# Patient Record
Sex: Male | Born: 1940 | Race: White | Hispanic: No | Smoking: Never smoker
Health system: Southern US, Community
[De-identification: ages and names within clinical notes are randomized; demographics above are authoritative.]

## PROBLEM LIST (undated history)

## (undated) DIAGNOSIS — C801 Malignant (primary) neoplasm, unspecified: Secondary | ICD-10-CM

## (undated) DIAGNOSIS — Z9289 Personal history of other medical treatment: Secondary | ICD-10-CM

## (undated) DIAGNOSIS — I2510 Atherosclerotic heart disease of native coronary artery without angina pectoris: Secondary | ICD-10-CM

## (undated) DIAGNOSIS — D649 Anemia, unspecified: Secondary | ICD-10-CM

## (undated) DIAGNOSIS — I10 Essential (primary) hypertension: Secondary | ICD-10-CM

## (undated) DIAGNOSIS — K429 Umbilical hernia without obstruction or gangrene: Secondary | ICD-10-CM

## (undated) DIAGNOSIS — I350 Nonrheumatic aortic (valve) stenosis: Secondary | ICD-10-CM

## (undated) DIAGNOSIS — E119 Type 2 diabetes mellitus without complications: Secondary | ICD-10-CM

## (undated) DIAGNOSIS — E785 Hyperlipidemia, unspecified: Secondary | ICD-10-CM

## (undated) DIAGNOSIS — E559 Vitamin D deficiency, unspecified: Secondary | ICD-10-CM

## (undated) DIAGNOSIS — E538 Deficiency of other specified B group vitamins: Secondary | ICD-10-CM

## (undated) DIAGNOSIS — K219 Gastro-esophageal reflux disease without esophagitis: Secondary | ICD-10-CM

## (undated) HISTORY — DX: Anemia, unspecified: D64.9

## (undated) HISTORY — DX: Nonrheumatic aortic (valve) stenosis: I35.0

## (undated) HISTORY — DX: Malignant (primary) neoplasm, unspecified: C80.1

## (undated) HISTORY — DX: Gastro-esophageal reflux disease without esophagitis: K21.9

## (undated) HISTORY — DX: Type 2 diabetes mellitus without complications: E11.9

## (undated) HISTORY — DX: Morbid (severe) obesity due to excess calories: E66.01

## (undated) HISTORY — DX: Essential (primary) hypertension: I10

## (undated) HISTORY — DX: Hyperlipidemia, unspecified: E78.5

## (undated) HISTORY — DX: Deficiency of other specified B group vitamins: E53.8

## (undated) HISTORY — DX: Atherosclerotic heart disease of native coronary artery without angina pectoris: I25.10

## (undated) HISTORY — DX: Vitamin D deficiency, unspecified: E55.9

## (undated) HISTORY — DX: Umbilical hernia without obstruction or gangrene: K42.9

## (undated) HISTORY — DX: Personal history of other medical treatment: Z92.89

---

## 1998-04-03 ENCOUNTER — Ambulatory Visit (HOSPITAL_COMMUNITY): Admission: RE | Admit: 1998-04-03 | Discharge: 1998-04-03 | Payer: Self-pay | Admitting: *Deleted

## 1999-05-01 ENCOUNTER — Ambulatory Visit (HOSPITAL_COMMUNITY): Admission: RE | Admit: 1999-05-01 | Discharge: 1999-05-01 | Payer: Self-pay | Admitting: *Deleted

## 2000-05-15 ENCOUNTER — Ambulatory Visit (HOSPITAL_COMMUNITY): Admission: RE | Admit: 2000-05-15 | Discharge: 2000-05-15 | Payer: Self-pay | Admitting: *Deleted

## 2001-05-17 ENCOUNTER — Encounter: Admission: RE | Admit: 2001-05-17 | Discharge: 2001-06-23 | Payer: Self-pay | Admitting: Internal Medicine

## 2001-06-30 ENCOUNTER — Encounter (INDEPENDENT_AMBULATORY_CARE_PROVIDER_SITE_OTHER): Payer: Self-pay | Admitting: Specialist

## 2001-06-30 ENCOUNTER — Ambulatory Visit (HOSPITAL_COMMUNITY): Admission: RE | Admit: 2001-06-30 | Discharge: 2001-06-30 | Payer: Self-pay | Admitting: Gastroenterology

## 2003-06-13 ENCOUNTER — Encounter: Admission: RE | Admit: 2003-06-13 | Discharge: 2003-09-11 | Payer: Self-pay | Admitting: Internal Medicine

## 2003-06-15 ENCOUNTER — Encounter (INDEPENDENT_AMBULATORY_CARE_PROVIDER_SITE_OTHER): Payer: Self-pay | Admitting: Cardiology

## 2003-06-15 ENCOUNTER — Ambulatory Visit (HOSPITAL_COMMUNITY): Admission: RE | Admit: 2003-06-15 | Discharge: 2003-06-15 | Payer: Self-pay | Admitting: Cardiology

## 2003-07-07 ENCOUNTER — Encounter: Payer: Self-pay | Admitting: Cardiology

## 2003-07-07 ENCOUNTER — Encounter: Admission: RE | Admit: 2003-07-07 | Discharge: 2003-07-07 | Payer: Self-pay | Admitting: Cardiology

## 2003-07-13 ENCOUNTER — Ambulatory Visit (HOSPITAL_COMMUNITY): Admission: RE | Admit: 2003-07-13 | Discharge: 2003-07-13 | Payer: Self-pay | Admitting: Cardiology

## 2003-07-13 HISTORY — PX: CARDIAC CATHETERIZATION: SHX172

## 2003-07-18 ENCOUNTER — Encounter: Payer: Self-pay | Admitting: Cardiothoracic Surgery

## 2003-07-18 ENCOUNTER — Encounter (INDEPENDENT_AMBULATORY_CARE_PROVIDER_SITE_OTHER): Payer: Self-pay | Admitting: *Deleted

## 2003-07-18 ENCOUNTER — Inpatient Hospital Stay (HOSPITAL_COMMUNITY): Admission: RE | Admit: 2003-07-18 | Discharge: 2003-07-25 | Payer: Self-pay | Admitting: Cardiothoracic Surgery

## 2003-07-18 HISTORY — PX: TEE WITHOUT CARDIOVERSION: SHX5443

## 2003-07-18 HISTORY — PX: AORTIC VALVE REPLACEMENT: SHX41

## 2003-07-18 HISTORY — PX: AORTIC VALVE REPLACEMENT (AVR)/CORONARY ARTERY BYPASS GRAFTING (CABG): SHX5725

## 2003-07-19 ENCOUNTER — Encounter: Payer: Self-pay | Admitting: Cardiothoracic Surgery

## 2003-07-20 ENCOUNTER — Encounter: Payer: Self-pay | Admitting: Cardiothoracic Surgery

## 2003-08-07 ENCOUNTER — Encounter (HOSPITAL_COMMUNITY): Admission: RE | Admit: 2003-08-07 | Discharge: 2003-11-05 | Payer: Self-pay | Admitting: Cardiology

## 2003-08-18 ENCOUNTER — Encounter: Payer: Self-pay | Admitting: Cardiothoracic Surgery

## 2003-08-18 ENCOUNTER — Encounter: Admission: RE | Admit: 2003-08-18 | Discharge: 2003-08-18 | Payer: Self-pay | Admitting: Cardiothoracic Surgery

## 2005-11-04 ENCOUNTER — Ambulatory Visit: Payer: Self-pay | Admitting: Dentistry

## 2005-11-04 ENCOUNTER — Encounter: Admission: AD | Admit: 2005-11-04 | Discharge: 2005-11-04 | Payer: Self-pay | Admitting: Dentistry

## 2010-02-12 DIAGNOSIS — Z9289 Personal history of other medical treatment: Secondary | ICD-10-CM

## 2010-02-12 HISTORY — PX: NM MYOCAR PERF WALL MOTION: HXRAD629

## 2010-02-12 HISTORY — DX: Personal history of other medical treatment: Z92.89

## 2010-10-14 ENCOUNTER — Encounter: Admission: RE | Admit: 2010-10-14 | Discharge: 2010-10-14 | Payer: Self-pay | Admitting: Gastroenterology

## 2011-04-11 NOTE — Discharge Summary (Signed)
NAME:  Billy Reilly, Billy Reilly NO.:  [id number]   MEDICAL RECORD NO.:  [id number]                   PATIENT TYPE:  INP   LOCATION:  2012                                 FACILITY:  [facility]   PHYSICIAN:  Ivin Poot, M.D.               DATE OF BIRTH:  August 07, 1941   DATE OF ADMISSION:  07/18/2003  DATE OF DISCHARGE:  07/24/2003                                 DISCHARGE SUMMARY   ADMISSION DIAGNOSES:  1. Aortic stenosis with progressive dyspnea and EKG changes.  2. Hypertension.  3. Obesity with body mass index of 38.8.  4. Adult-onset diabetes mellitus type 2, noninsulin dependent.  5. Hypercholesterolemia.  6. Carcinoma of the prostate.  7. History of diverticulosis.   DISCHARGE DIAGNOSES:  1. 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  2. Hypertension.  3. Obesity with body mass index of 38.8.  4. Adult-onset diabetes mellitus type 2, noninsulin dependent.  5. Hypercholesterolemia.  6. Carcinoma of the prostate.  7. History of diverticulosis.   PROCEDURE:  1. Cardiac catheterization July 13, 2003, Dr. Aldona Bar.  2. Aortic valve replacement with #22 mm ATS-AP valve and saphenous vein     graft to the first diagonal and saphenous vein graft to the posterior     descending July 18, 2003, Dr. Prescott Gum.   BRIEF HISTORY:  The patient is a 70 year old obese type 2 diabetic who has a  history of aortic stenosis which had become worse on echocardiogram, going  from 30 mm up to 40 mm. He also had been asymptomatic but recently had  developed increasing shortness of breath and EKG showed ST segment changes  in the lateral leads which were new. He also had inverted T waves in leads  II, III, and aVF. He was subsequently scheduled for cardiac catheterization  at this time.   PAST MEDICAL HISTORY:  1. Hypertension.  2. Obesity.  3. Type 2 diabetes.  4. Hyperlipidemia.   MEDICATIONS  ON ADMISSION:  1. Mavik 1 mg q.d.  2. Aspirin 325 mg q.d.  3. Toprol-XL 50 mg q.d.  4. Cardizem CD 180 mg bid  5. Nexium 40 mg q.d.  6. Lipitor 10 mg q.d.  7. Actos 15 mg q.d.  8. Viagra 15 mg p.r.n.   ALLERGIES:  None known.   HOSPITAL COURSE:  The patient was admitted and taken to the catheterization  lab by Dr. Rex Kras and underwent cardiac catheterization. The patient was  found to have a 65-mm gradient with aortic valve area of approximately 1.1  cm. Because of his anatomy, he could not do a left ventricular arteriogram.  The cardiac catheterization showed two-vessel disease with a mild stenosis  in the first diagonal and posterior descending. The proximal RCA was 50%,  posterior descending was 70%, diagonal was 50% stenosed. After reviewing the  studies, it was Dr. Lucianne Lei Trigt's opinion that patient should undergo aortic  valve replacement along with coronary artery bypass grafting. He was seen in  consultation by Dr. Tharon Aquas Trigt. Dr. Prescott Gum evaluated the patient and  reviewed the studies and agreed that he had significant aortic stenosis with  progressive Class III congestive heart failure. It was also his opinion the  patient should undergo coronary artery bypass grafting to both his diagonal  and right coronary arteries. Risks and benefits were discussed, and informed  operative consent was obtained. He was taken to the operating room on July 18, 2003 and underwent the procedures described above. He tolerated the  procedure well and came off bypass. He was transferred to the ICU in  satisfactory condition. Postoperatively, his course has been fairly benign.  He had no significant postoperative complications and no arrhythmias. He was  transferred to the floor and started on Phase I cardiac rehab. His glucoses  were well controlled initially on insulin,and he has been switched over to  oral agents. He was started on Coumadin, and his INR went from 2.1 to 3.4   overnight on July 22, 2003. The following a.m. of July 23, 2003, his  CBGs were all normal, blood pressure was stable and was running between the  150s and 118/70s. O2 saturations were 935 on room air. His weight was 261.6  pounds with the listed preoperative weight of 270. His saturations were good  on room air, and overall he was doing quite well. His INR as noted went up  to 25.7 seconds with an INR of 3.4. His Coumadin was held on July 23, 2003. His other medications were advanced. He had no significant arrhythmias  except for a short run of wide complex tachycardia at approximately six  beats. Dr. Mathis Bud advanced his Lopressor eventually up to 50 mg p.o.  b.i.d. His Actos was resumed at 15 mg q.d. He was started on Altace 5 mg  q.d. He was given Ultram 50 mg one to two q.4h. p.r.n. We plan to reinitiate  his Lipitor 10 mg q.d. if his LFTs are normal in the a.m. He is on Coumadin  as noted; dose will be held on July 23, 2003, and we will dose him based  on his protime in the a.m. on July 24, 2003. He will return to see Dr.  Rex Kras in two weeks, Dr. Prescott Gum in three weeks. He is to get his Coumadin  checked on July 26, 2003, 48 hours after his discharge.   DISCHARGE ACTIVITIES:  Light to moderate. No lifting greater than 10 pounds.  No driving. No strenuous activity.   CONDITION ON DISCHARGE:  Improving.    DISCHARGE LABORATORY DATA:  Shows electrolytes to be normal, BUN of 12,  creatinine of 0.9. Hemoglobin of 11 with hematocrit of 33. White count was  10.4, platelets were 137,000.      Lydia Guiles, P.A.                 Ivin Poot, M.D.    WDJ/MEDQ  D:  07/23/2003  T:  07/24/2003  Job:  [id number]   cc:   Ray Church  [address]  [place] [address]  Fax: Afton Little, M.D.  [address] [place]  [place] [address]  Fax: [phone]

## 2011-04-11 NOTE — Op Note (Signed)
NAME:  MARQUALE, PAVLOCK NO.:  [id number]   MEDICAL RECORD NO.:  [id number]                   PATIENT TYPE:  INP   LOCATION:  2308                                 FACILITY:  [facility]   PHYSICIAN:  Ivin Poot III, M.D.           DATE OF BIRTH:  Sep 03, 1941   DATE OF PROCEDURE:  07/18/2003  DATE OF DISCHARGE:                                 OPERATIVE REPORT   PREOPERATIVE DIAGNOSIS:  Class three congestive heart failure with severe  aortic stenosis and moderate two-vessel coronary artery disease.   POSTOPERATIVE DIAGNOSIS:  Class three congestive heart failure with severe  aortic stenosis and moderate two-vessel coronary artery disease.   OPERATION PERFORMED:  Aortic valve replacement and coronary artery bypass  grafting times two (aortic valve replacement with a 22 mm ATS-AP valve,  saphenous vein graft to the first diagonal, saphenous vein graft to the  posterior descending).   SURGEON:  Ivin Poot, M.D.   ASSISTANT:  Mardene Celeste, N.P.   ANESTHESIA:  General.   ANESTHESIOLOGIST:  Nelda Severe. Tobias Alexander, M.D.   INDICATIONS FOR PROCEDURE:  The patient is a 70 year old obese white male  diabetic with symptoms of progressive dyspnea and decreased exercise  tolerance.  2-D echocardiogram and cardiac catheterization documented severe  aortic stenosis and he was also found to have two vessel moderate coronary  disease of the right coronary and first diagonal.  He was felt to be a  candidate for aortic valve replacement and surgical coronary  revascularization.  Prior to his surgery, I examined the patient in the cath  lab holding area and reviewed the results of the cardiac catheterization and  2-D echocardiogram with the patient and his wife.  I discussed with the  patient and his wife the indications and expected benefits of aortic valve  replacement and coronary artery bypass grafting for treatment of his aortic  valvular and coronary artery  disease.  I discussed the alternatives to  surgical therapy for treatment of his heart disease as well.  I reviewed  with the patient the major aspects of the proposed procedure including the  location of the surgical incisions, the use of general anesthesia and  cardiopulmonary bypass.  The patient preference to use a mechanical valve  with optimal long term durability, the subsequent lifelong commitment to  Coumadin anticoagulation therapy that a mechanical valve would require, as  well as the plan to use saphenous vein harvested from his leg for a conduit  for grafting the coronaries.  I discussed with the patient the risks to him  of the proposed operation including the risks of MI, CVA, bleeding, blood  transfusion requirement, wound infection, and death.  He understood these  factors and aspects of the procedure and agreed to proceed with the  operation as planned under what I felt was an informed consent.   OPERATIVE FINDINGS:  Due to the patient's  body habitus, the aorta was  foreshortened and there is significant biventricular enlargement. Exposure  of the heart for any redo procedure would be extremely hazardous due to his  enlarged right-sided structures, foreshortened aorta and the heart being  located superior in the mediastinum.  The saphenous vein was of average  quality, harvested from the right thigh using endoscopic technique.  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  A transesophageal echocardiogram of the valve  post pump showed it to be functioning well without aortic insufficiency.   DESCRIPTION OF PROCEDURE:  The patient was brought to the operating room and  placed supine on the operating table where general anesthesia was induced  under invasive hemodynamic monitoring.  A transesophageal 2-D echocardiogram  probe was placed by Dr. Tobias Alexander of the anesthesia service and the  preoperative diagnosis  of severe aortic stenosis with left ventricular  hypertrophy was confirmed.  A sternal incision was then made as the  saphenous vein was harvested from the right thigh using endoscopic vein  technique.  The sternal retractor was placed.  A pericardial cradle was  created.  Heparin was administered and the ACT was documented as being  therapeutic.  Pursestring suture were placed in the ascending aorta and  right atrium and the patient was cannulated and placed on bypass and cooled  to 32 degrees.  A left ventricular vent was placed via the right superior  pulmonary vein.  A cardioplegia cannula was placed in the ascending aorta as  well as through a pursestring suture in the right atrium into the coronary  sinus for both antegrade and retrograde delivery of cold blood cardioplegia.  The coronaries were identified for grafting.  As the aortic cross-clamp was  applied, a total of 817mL of cold blood cardioplegia was delivered in split  doses between the antegrade aortic and retrograde coronary sinus catheters.  There was good cardioplegic arrest with septal temperature dropping less  than 12 degrees.  Topical iced saline slush was used to augment myocardial  preservation and a pericardial insulator pad was used to protect the left  phrenic nerve.   The distal coronary anastomoses were then performed.  The first distal  anastomosis was to the posterior descending.  This had a proximal 75%  stenosis and a reversed saphenous vein was sewn end-to-side with running 7-0  Prolene with good flow through the graft.  The second distal anastomosis was  to the first diagonal.  This is a smaller 1.2 vessel with proximal 80%  stenosis.  A reversed saphenous vein was sewn end-to-side with running 7-0  Prolene with good flow through the graft.  Cardioplegia was redosed.  Attention was then directed to the aortic valve.  A transverse aortotomy was  made and the aortic valve was inspected.  It was heavily  calcified, stenotic  and bicuspid.  It was excised and the annulus was debrided of extensive  calcification.  The outflow tract was irrigated with copious amounts of cold  saline.  The annulus was sized to a 22 mm AP valve of the ATS mechanical  variety.  The subannular 2-0 pledgeted mattress sutures of Ethibond were  placed around the annulus measuring a total of 16 sutures.  These were then  brought through the sewing ring of the valve.  The valve was seated and  sutures were tied.  The valve was inspected and found to function without  impingement or without obstruction of  the coronary ostia.  The aortotomy was  closed in two layers  using running 4-0 Prolene and air was vented from the  left side of the heart.  Prior to removing the cross-clamp, two proximal  vein anastomoses were placed on the ascending aorta just above the  aortotomy.  A dose of warm retrograde blood was given to flush air from the  coronaries and the usual deairing maneuvers were performed prior to recent  crossclamp just as the final suture to the vein graft was tied to allow air  to escape from the ascending aorta.   The heart resumed a spontaneous rhythm.  The aortotomy and coronary  anastomoses were checked and found to be hemostatic.  The LV vent and  cardioplegia cannulas were removed.  Temporary pacing wires were applied.  The lungs were re-expanded and the ventilator was resumed.  The patient was  weaned from bypass after he reached 37 degrees.  Cardiac output and blood  pressure were stable.  The transesophageal 2-D echocardiogram exam post  separation from bypass showed the new mechanical valve to be working well  and there was good global left ventricular function.   Protamine was administered to reverse the heparin and there was no adverse  reaction to the protamine.  Cannulas were removed.  The mediastinum was  irrigated with warm antibiotic irrigation.  The superior pericardial fat was  closed over  the aorta and vein grafts.  Two mediastinal chest tubes were  placed and brought out through separate  incisions.  The sternum was reapproximated with eight interrupted steel  wire.  The pectoralis fascia was closed with interrupted #1 Vicryl.  The  subcutaneous fat and skin were closed with a running Vicryl.  Total bypass  time was 180 minutes with aortic cross-clamp of 130 minutes.                                                 Len Childs, M.D.    PV/MEDQ  D:  07/18/2003  T:  07/19/2003  Job:  [id number]   cc:   Jeanella Craze. Little, M.D.  [address]. [place]  [place] [address]  Fax: [phone]
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
## 2011-04-11 NOTE — Cardiovascular Report (Signed)
NAME:  Billy Reilly, Billy Reilly                           ACCOUNT NO.:  [id number]   MEDICAL RECORD NO.:  [id number]                   PATIENT TYPE:  OIB   LOCATION:  2857                                 FACILITY:  [facility]   PHYSICIAN:  Jeanella Craze. Little, M.D.              DATE OF BIRTH:  [birth year]   DATE OF PROCEDURE:  07/13/2003  DATE OF DISCHARGE:                              CARDIAC CATHETERIZATION   INDICATIONS FOR TEST:  Billy Reilly is a 70 year old male who has known aortic  stenosis.  An echocardiogram performed June 15, 2003 showed a decrease in LV  systolic function at [id number].  Previously, it had been normal.  He has dense  calcification of his aortic valve and had a mean aortic valve gradient of  30.   He had some atypical chest pain also.   PROCEDURE:  The patient was prepped and draped in the usual sterile fashion  exposing the right groin. Following local anesthetic with 1% Xylocaine, an 8  French introducer sheath was placed into the right femoral vein.  A 6 French  introducer sheath in the right femoral artery.  A Swan-Ganz catheter was  then advanced through its normal route into the pulmonary artery and  hemodynamic monitoring was undertaken throughout each station.  Cardiac  output by thermodilution was then performed.   At this point, attempts at crossing the aortic valve were undertaken.  Over  an hour of time was used trying to cross this valve.  A combination of  angles and straight pigtails, right coronary arteries and finally a [name]  with an 0.035 guide wire was used and the aortic valve was crossed.  There  was marked ventricular ectopy.  I did not feel I had time because of the  ectopy to draw oxygen saturations.  The [facility] catheter is not an  appropriate catheter for injection into the left ventricular cavity, so only  pressure monitoring in the LV and pullback across the aortic valve was  performed.   Right and left coronary arteriography was also performed.  A  hand injection  of the aortic root was also performed.   RESULTS:   HEMODYNAMIC MONITORING:  Right atrial pressure 11, right ventricular  pressure 35/9.  Pulmonary artery pressure was 23/11 and the wedge was 16.  Cardiac output by thermodilution was 2.7 liters per minute and the cardiac  index was 2.8.  The central aortic pressure after pullback was 143/84.  The  left ventricular pressure was 213/8.  The mean gradient was 52.6 mmHg.  The  peak aortic valve gradient was 65 mmHg and the aortic valve area calculated  at 1.1 cm.   No ventriculogram was performed.  See above.   Hand injection of the aortic root showed no aortic insufficiency, but dense  calcification of the aortic valve.   CORONARY ARTERIOGRAPHY:  The right coronary artery was small.  It had an  area of 50% narrowing just proximal to the takeoff of the PDA and an area of  70% narrowing at the PL ostium.   Left main:  There was basically common ostium for both the circumflex and  the LAD.   LAD:  The LAD extended to the apex of the heart and was free of disease.  The first diagonal had 50% ostial narrowing.   Circumflex:  The circumflex was the largest of all three vessels.  It gave  rise to two very large OM vessels that were free of significant disease.   CONCLUSION:  1. Severe aortic stenosis with a mean aortic valve gradient of 52 mmHg and a     calculated aortic valve area of 1.1.  2. Single-vessel coronary disease with obstruction of the ostium of a small     posterior lateral branch with some obstruction in the ostium of the first     diagonal.   (LAD approached the apex of the heart was free of disease.  First diagonal  50% ostial narrowing).   By echocardiogram on July 22, the ejection fraction has diminished to around  40-45%.   In view of the high gradient across the aortic valve and decreased LV  systolic function, I feel it is time for valve replacement.  The patient  will be discharged home today  and I will make him appointment to see the  surgeons as an outpatient for consideration of valve replacement and single-  vessel coronary artery bypass graft.                                                  Jeanella Craze. Little, M.D.    ABL/MEDQ  D:  07/13/2003  T:  07/13/2003  Job:  [id number]   cc:   Darcus Austin, D.O.  [address], [address]  [place]  [place] [address]  Fax: [phone]   CVTS

## 2011-04-11 NOTE — Op Note (Signed)
NAME:  Billy Reilly, Billy Reilly                           ACCOUNT NO.:  [id number]   MEDICAL RECORD NO.:  [id number]                   PATIENT TYPE:  INP   LOCATION:  2308                                 FACILITY:  [facility]   PHYSICIAN:  Nelda Severe. Tobias Alexander, M.D.               DATE OF BIRTH:  1940-12-16   DATE OF PROCEDURE:  07/18/2003  DATE OF DISCHARGE:                                 OPERATIVE REPORT   PROCEDURE:  Transesophageal echocardiogram.   DIAGNOSES:  1. Coronary artery disease.  2. Aortic stenosis.   ANESTHESIOLOGIST:  Nelda Severe. Tobias Alexander, M.D.   BRIEF HISTORY:  Mr. Leray Mulvihill is a 70 year old white male who presents to  the operating room for repair of his aortic valve and bypass of his coronary  arteries.  Dr. Tharon Aquas Trigt requested transesophageal echocardiogram for  the patient's procedure.   DESCRIPTION OF PROCEDURE:  Following a routine cardiac induction, the  esophageal probe was lubricated and covered and lubricated again.  The probe  was inserted through a mouth guard into the patient's esophagus for cardiac  imaging.  Overall images of the heart showed no evidence of pericardial  effusion.  The right atrium was normal in size with no evidence of septal  defect.  The tricuspid valve had normal structure, although was difficult to  visualize due to shadowing secondary to the calcium on the aortic valve.  There was mild tricuspid regurgitation.  The right ventricle appeared normal  in size without segmental wall motion abnormalities.  The left atrium was  normal with normal pulmonary vein flow.  There was no evidence of thrombus  in the atrial appendage.  The mitral valve was normal in structure with mild  mitral regurgitation.  The left ventricle demonstrated LVH with a left  ventricular wall size of 1.8 cm with no evidence of segmental wall motion  abnormalities.  The aortic valve was heavily calcified.  It was difficult to  even visualize an opening.  The anulus appeared  to measure 2.45 cm with a  sinus stenosis at 3.1.  The sinotubular junction measured 2.27.  The distal  aorta was 4 cm.  A gradient was measured across the valve which demonstrated  a peak gradient of 107 and a mean gradient of 72.4.  This was felt to be  severe, and the patient then underwent bypass grafting and aortic valve  replacement.   Following repair, the patient successfully separated from bypass, and the  heart was then evaluated.  The  prosthetic valve appeared to be functioning  normally. There was no evidence of perivalvular leak.  There were two small  regurgitant jets that were normal for this type of valve.  There was no  evidence of stenosis.  The left ventricle appeared to function well with no  evidence of segmental wall motion abnormality.  The patient continued to do  well.  The transesophageal probe  was removed, and the patient was taken to  SICU in good condition.                                               Nelda Severe Tobias Alexander, M.D.    JDS/MEDQ  D:  07/18/2003  T:  07/18/2003  Job:  [id number]

## 2011-04-11 NOTE — Procedures (Signed)
[facility]  Patient:    Billy Reilly, Billy Reilly                        MRN: [id number] Proc. Date: 06/30/01 Adm. Date:  [id number] Attending:  Sherrin Daisy CC:         Kirtland Bouchard, M.D.   Procedure Report  PROCEDURE PERFORMED:  Colonoscopy and polypectomy.  ENDOSCOPIST:  Joyice Faster. Oletta Lamas, M.D.  MEDICATIONS USED:  Fentanyl 75 mcg, Versed 7.5 mg IV.  INSTRUMENT:  Adult Olympus video colonoscope.  INDICATIONS:  Heme positive stool in a 70 year old gentleman.  DESCRIPTION OF PROCEDURE:  The procedure had been explained to the patient and consent obtained.  With the patient in the left lateral decubitus position, the Olympus adult video colonoscope was inserted and advanced under direct visualization.  The prep was excellent and we were able to advance to the cecum using abdominal pressure and position changes.  The cecum was identified by identification of the ileocecal valve and appendiceal orifice. The scope was withdrawn.  The cecum, ascending colon, hepatic flexure, transverse colon, splenic flexure, descending and sigmoid colon were seen well upon withdrawal. 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 There was a 0.75 polyp in the midsigmoid colon that was removed and recovered.  No other polyps were seen.  Scope withdrawn, patient tolerated the procedure well.  ASSESSMENT: 1. Sigmoid colon polyp removed. 2. Diverticulosis.  PLAN:  Routine postpolypectomy instructions.  Will recommend repeating procedure in three years. DD:  06/30/01 TD:  06/30/01 Job: [id number]

## 2011-08-27 ENCOUNTER — Encounter (INDEPENDENT_AMBULATORY_CARE_PROVIDER_SITE_OTHER): Payer: Self-pay | Admitting: Surgery

## 2011-09-02 ENCOUNTER — Encounter (INDEPENDENT_AMBULATORY_CARE_PROVIDER_SITE_OTHER): Payer: Self-pay | Admitting: Surgery

## 2011-09-02 ENCOUNTER — Ambulatory Visit (INDEPENDENT_AMBULATORY_CARE_PROVIDER_SITE_OTHER): Payer: Medicare Other | Admitting: Surgery

## 2011-09-02 VITALS — BP 132/78 | HR 64 | Temp 98.8°F | Resp 20 | Ht 69.0 in | Wt 271.4 lb

## 2011-09-02 DIAGNOSIS — K429 Umbilical hernia without obstruction or gangrene: Secondary | ICD-10-CM

## 2011-09-02 NOTE — Patient Instructions (Signed)
Stop your Coumadin 5 days preop.  Start the Lovenox when you stop it.  Give your last shot the day before surgery.  Resume the Lovenox the day after surgery.

## 2011-09-02 NOTE — Progress Notes (Signed)
Chief Complaint  Patient presents with  . Other    new pt- eval umb hernia    HPI Billy Reilly is a 70 y.o. male.   HPIPleasant gentleman referred by Dr. Vicente Serene for evaluation of an umbilical hernia. The patient reports he has had it for many years and it always easily reduces but it is now getting larger and causing him to have some discomfort. He denies any obstructive symptoms.  Past Medical History  Diagnosis Date  . Hypertension   . GERD (gastroesophageal reflux disease)   . Cancer     prostate  . Anemia   . Diabetes mellitus   . Hyperlipidemia     Past Surgical History  Procedure Date  . Aortic valve replacement 2005  . Hernia repair     RIH    Family History  Problem Relation Age of Onset  . Stroke Mother   . Cancer Sister     pt unaware of what type  . Cancer Brother     brain tumor, kidney    Social History History  Substance Use Topics  . Smoking status: Never Smoker   . Smokeless tobacco: Not on file  . Alcohol Use: No    No Known Allergies  Current Outpatient Prescriptions  Medication Sig Dispense Refill  . aspirin 81 MG tablet Take 81 mg by mouth daily.        . Cholecalciferol (VITAMIN D PO) Take by mouth daily.        . enalapril (VASOTEC) 20 MG tablet Take 40 mg by mouth daily.        Marland Kitchen glimepiride (AMARYL) 4 MG tablet Take 4 mg by mouth daily before breakfast.        . hydrochlorothiazide (HYDRODIURIL) 25 MG tablet Take 25 mg by mouth daily.        . insulin NPH (HUMULIN N,NOVOLIN N) 100 UNIT/ML injection Inject into the skin. 45 am and 35 pm       . Magnesium 250 MG TABS Take by mouth daily.        . metFORMIN (GLUCOPHAGE) 500 MG tablet Three times a day.      . metoprolol (LOPRESSOR) 50 MG tablet Take 50 mg by mouth daily.        Marland Kitchen omeprazole (PRILOSEC) 20 MG capsule Take 20 mg by mouth daily.        . simvastatin (ZOCOR) 40 MG tablet Take 40 mg by mouth at bedtime.        Marland Kitchen warfarin (COUMADIN) 5 MG tablet Take 5 mg by mouth daily. 1/2  x 3 days and 1 x 4 day         Review of Systems Review of Systems  Constitutional: Negative.   HENT: Negative.   Eyes: Negative.   Respiratory: Negative.   Cardiovascular: Negative.   Gastrointestinal: Negative.   Genitourinary: Negative.   Musculoskeletal: Negative.   Skin: Negative.   Neurological: Negative.   Hematological: Negative.   Psychiatric/Behavioral: Negative.     Blood pressure 132/78, pulse 64, temperature 98.8 F (37.1 C), temperature source Temporal, resp. rate 20, height 5\' 9"  (1.753 m), weight 271 lb 6.4 oz (123.106 kg).  Physical Exam Physical Exam  Constitutional: He appears well-nourished. No distress.  HENT:  Head: Normocephalic and atraumatic.  Right Ear: External ear normal.  Left Ear: External ear normal.  Nose: Nose normal.  Mouth/Throat: Oropharynx is clear and moist. No oropharyngeal exudate.  Eyes: Conjunctivae are normal. Pupils are equal, round, and  reactive to light. Left eye exhibits no discharge. No scleral icterus.  Neck: Normal range of motion. No JVD present. No tracheal deviation present. No thyromegaly present.  Cardiovascular: Normal rate, regular rhythm and intact distal pulses.   Murmur heard. Pulmonary/Chest: Effort normal and breath sounds normal. No respiratory distress. He has no wheezes.  Abdominal: Soft. Bowel sounds are normal. He exhibits no distension. There is no tenderness. There is no rebound and no guarding. A hernia is present. Hernia confirmed positive in the ventral area.  Lymphadenopathy:    He has no cervical adenopathy.    Data Reviewed   Assessment    Patient with a symptomatic reducible umbilical hernia    Plan    Repair is recommended with mesh. I discussed with the laparoscopic and open techniques. As he is on Coumadin and he has a large amount umbilical skin open repair is recommended. He will need to stop his Coumadin 5 days prior to surgery and I will start him on Lovenox. I discussed the risks of  surgery which includes but not limited to bleeding, infection, injury to the bowels, recurrence, etc. He understands and wishes to proceed.       Bijal Siglin A 09/02/2011, 11:37 AM

## 2011-09-04 ENCOUNTER — Encounter (HOSPITAL_COMMUNITY)
Admission: RE | Admit: 2011-09-04 | Discharge: 2011-09-04 | Disposition: A | Discharge status: Home / Self Care | Payer: Medicare Other | Source: Ambulatory Visit | Attending: Surgery | Admitting: Surgery

## 2011-09-04 ENCOUNTER — Other Ambulatory Visit (INDEPENDENT_AMBULATORY_CARE_PROVIDER_SITE_OTHER): Payer: Self-pay | Admitting: Surgery

## 2011-09-04 DIAGNOSIS — K429 Umbilical hernia without obstruction or gangrene: Secondary | ICD-10-CM

## 2011-09-04 LAB — COMPREHENSIVE METABOLIC PANEL WITH GFR
ALT: 23 U/L (ref 0–53)
AST: 21 U/L (ref 0–37)
Albumin: 3.8 g/dL (ref 3.5–5.2)
Alkaline Phosphatase: 99 U/L (ref 39–117)
BUN: 12 mg/dL (ref 6–23)
CO2: 25 meq/L (ref 19–32)
Calcium: 9.1 mg/dL (ref 8.4–10.5)
Chloride: 101 meq/L (ref 96–112)
Creatinine, Ser: 0.83 mg/dL (ref 0.50–1.35)
GFR calc Af Amer: >90 mL/min
GFR calc non Af Amer: 88 mL/min — ABNORMAL LOW
Glucose, Bld: 225 mg/dL — ABNORMAL HIGH (ref 70–99)
Potassium: 4.4 meq/L (ref 3.5–5.1)
Sodium: 136 meq/L (ref 135–145)
Total Bilirubin: 0.7 mg/dL (ref 0.3–1.2)
Total Protein: 6.4 g/dL (ref 6.0–8.3)

## 2011-09-04 LAB — APTT: aPTT: 44 s — ABNORMAL HIGH (ref 24–37)

## 2011-09-04 LAB — CBC
HCT: 42.6 % (ref 39.0–52.0)
Hemoglobin: 14.6 g/dL (ref 13.0–17.0)
MCH: 30.5 pg (ref 26.0–34.0)
MCHC: 34.3 g/dL (ref 30.0–36.0)
MCV: 89.1 fL (ref 78.0–100.0)
Platelets: 159 10*3/uL (ref 150–400)
RBC: 4.78 MIL/uL (ref 4.22–5.81)
RDW: 14.6 % (ref 11.5–15.5)
WBC: 5.9 10*3/uL (ref 4.0–10.5)

## 2011-09-04 LAB — PROTIME-INR
INR: 2.89 — ABNORMAL HIGH (ref 0.00–1.49)
Prothrombin Time: 30.7 s — ABNORMAL HIGH (ref 11.6–15.2)

## 2011-09-04 LAB — SURGICAL PCR SCREEN
MRSA, PCR: NEGATIVE
Staphylococcus aureus: NEGATIVE

## 2011-09-05 ENCOUNTER — Telehealth (INDEPENDENT_AMBULATORY_CARE_PROVIDER_SITE_OTHER): Payer: Self-pay | Admitting: General Surgery

## 2011-09-05 NOTE — Telephone Encounter (Signed)
Abnormal labs reviewed by dr. Evlyn Courier ok for surgery. davina green notified of ok for surgery.

## 2011-09-11 ENCOUNTER — Ambulatory Visit (HOSPITAL_COMMUNITY)
Admission: RE | Admit: 2011-09-11 | Discharge: 2011-09-11 | Disposition: A | Discharge status: Home / Self Care | Payer: Medicare Other | Source: Ambulatory Visit | Attending: Surgery | Admitting: Surgery

## 2011-09-11 DIAGNOSIS — K429 Umbilical hernia without obstruction or gangrene: Secondary | ICD-10-CM

## 2011-09-11 DIAGNOSIS — Z01812 Encounter for preprocedural laboratory examination: Secondary | ICD-10-CM | POA: Insufficient documentation

## 2011-09-11 DIAGNOSIS — E119 Type 2 diabetes mellitus without complications: Secondary | ICD-10-CM | POA: Insufficient documentation

## 2011-09-11 DIAGNOSIS — Z01818 Encounter for other preprocedural examination: Secondary | ICD-10-CM | POA: Insufficient documentation

## 2011-09-11 DIAGNOSIS — I2510 Atherosclerotic heart disease of native coronary artery without angina pectoris: Secondary | ICD-10-CM | POA: Insufficient documentation

## 2011-09-11 HISTORY — PX: HERNIA REPAIR: SHX51

## 2011-09-11 LAB — GLUCOSE, CAPILLARY
Glucose-Capillary: 102 mg/dL — ABNORMAL HIGH (ref 70–99)
Glucose-Capillary: 96 mg/dL (ref 70–99)

## 2011-09-11 LAB — PROTIME-INR
INR: 1.1 (ref 0.00–1.49)
Prothrombin Time: 14.4 s (ref 11.6–15.2)

## 2011-09-12 LAB — GLUCOSE, CAPILLARY: Glucose-Capillary: 133 mg/dL — ABNORMAL HIGH (ref 70–99)

## 2011-09-15 ENCOUNTER — Encounter (INDEPENDENT_AMBULATORY_CARE_PROVIDER_SITE_OTHER): Payer: Self-pay | Admitting: Surgery

## 2011-09-15 ENCOUNTER — Encounter (INDEPENDENT_AMBULATORY_CARE_PROVIDER_SITE_OTHER): Payer: Self-pay | Admitting: General Surgery

## 2011-09-15 ENCOUNTER — Ambulatory Visit (INDEPENDENT_AMBULATORY_CARE_PROVIDER_SITE_OTHER): Payer: Medicare Other | Admitting: Surgery

## 2011-09-15 VITALS — BP 129/80 | HR 68 | Temp 98.7°F | Resp 16 | Ht 69.0 in | Wt 268.2 lb

## 2011-09-15 DIAGNOSIS — Z09 Encounter for follow-up examination after completed treatment for conditions other than malignant neoplasm: Secondary | ICD-10-CM

## 2011-09-15 NOTE — Progress Notes (Signed)
Subjective:     Patient ID: Billy Reilly, male   DOB: Dec 07, 1940, 70 y.o.   MRN: [id number]  HPI  He came here today because he was concerned about some erythema on his abdominal wall status post umbilical hernia repair with mesh several days ago. He reports erythema it is improving. He has no fevers and has minimal discomfort Review of Systems     Objective:   Physical Exam On exam, there is some moderate erythema of the lower bowel wall there is also hematoma and bruising secondary to his Coumadin    Assessment:     Patient status post umbilical hernia repair with mesh    Plan:     I suspect this erythematous secondary to the external and a small hematoma from the Coumadin but as there is mesh present time to go ahead and start him on Keflex. I will see him back next week

## 2011-09-16 NOTE — Progress Notes (Signed)
Subjective:     Patient ID: Billy Reilly, male   DOB: [birth year], 70 y.o.   MRN: [id number]  HPI   Review of Systems     Objective:   Physical Exam     Assessment:         Plan:

## 2011-09-18 NOTE — Op Note (Signed)
  [name], BILL NO.:  [id number]  MEDICAL RECORD NO.:  [id number]  LOCATION:  SDSC                         FACILITY:  [facility]  PHYSICIAN:  Coralie Keens, M.D. DATE OF BIRTH:  12-27-1940  DATE OF PROCEDURE: DATE OF DISCHARGE:  09/11/2011                              OPERATIVE REPORT   PREOPERATIVE DIAGNOSIS:  Umbilical hernia.  POSTOPERATIVE DIAGNOSIS:  Umbilical hernia.  PROCEDURE:  Umbilical hernia repair with mesh (4 cm round V Patch).  SURGEON:  Coralie Keens, MD  ANESTHESIA:  General and injectable Exparel.  ESTIMATED BLOOD LOSS:  Minimal.  INDICATIONS:  Billy Reilly is a 70 year old gentleman who has a large umbilical hernia with incarcerated omentum.  Decision was made to proceed with repair with mesh.  PROCEDURE IN DETAIL:  The patient was brought to the operating room, identified as [name].  He was placed supine on the operating room table and general anesthesia was induced.  His abdomen was then prepped and draped in usual sterile fashion.  The patient had a large amount of redundant umbilical skin covering this large amount of herniated omentum.  I made a small incision transversely at the lower edge of the umbilicus.  I took this through subcutaneous tissue with a cautery. There was minimal splayed out hernia sac which I excised.  I was then able to free up all the attachments of the omentum and was able to duct the omentum back into the peritoneal cavity.  The actual fascial defect itself was about a 1.5 cm in size.  I then brought a 4 cm V-patch Prolene mesh onto the field.  I placed 4 separate sutures into the mesh and pulled it up to 4 separate corners of the abdominal wall.  I then placed the mesh through the fascial opening and then used the stay ties and sutures to pull the mesh up to the peritoneum.  I then tied all sutures in place securing the mesh to the peritoneal surface.  I then was able to close fascia over the  top of this with several interrupted #1 Novafil sutures as well.  Good coverage and closure of the fascial defect appeared to be achieved.  I then had to excise some of the redundant umbilical skin.  I then tacked the umbilical skin back down with a 3-0 Vicryl suture.  I then closed subcutaneous tissue with interrupted 3-0 Vicryl sutures and closed the skin with a running 4-0 Monocryl.  Prior to closing the skin, I did inject the fascia and skin circumferentially with injectable Exparel.  Steri-Strips, gauze and tape were then applied.  The patient tolerated the procedure well.  All counts were correct at the end of procedure.  The patient was then extubated in the operating room and taken in stable condition to recovery room.     Coralie Keens, M.D.     DB/MEDQ  D:  09/11/2011  T:  09/11/2011  Job:  [id number]  Electronically Signed by Coralie Keens M.D. on 09/18/2011 09:07:44 AM

## 2011-09-22 ENCOUNTER — Encounter (INDEPENDENT_AMBULATORY_CARE_PROVIDER_SITE_OTHER): Payer: Medicare Other | Admitting: Surgery

## 2011-09-24 ENCOUNTER — Encounter (INDEPENDENT_AMBULATORY_CARE_PROVIDER_SITE_OTHER): Payer: Self-pay | Admitting: Surgery

## 2011-09-29 ENCOUNTER — Other Ambulatory Visit: Payer: Self-pay | Admitting: Emergency Medicine

## 2011-09-30 ENCOUNTER — Ambulatory Visit (INDEPENDENT_AMBULATORY_CARE_PROVIDER_SITE_OTHER): Payer: Medicare Other | Admitting: Surgery

## 2011-09-30 ENCOUNTER — Encounter (INDEPENDENT_AMBULATORY_CARE_PROVIDER_SITE_OTHER): Payer: Self-pay | Admitting: Surgery

## 2011-09-30 VITALS — BP 128/76 | HR 72 | Temp 97.3°F | Resp 24 | Ht 69.0 in | Wt 268.2 lb

## 2011-09-30 DIAGNOSIS — Z09 Encounter for follow-up examination after completed treatment for conditions other than malignant neoplasm: Secondary | ICD-10-CM

## 2011-09-30 NOTE — Progress Notes (Signed)
Subjective:     Patient ID: Billy Reilly, male   DOB: 1941/08/18, 69 y.o.   MRN: [id number]  HPI  He is here for his first postoperative visit status post umbilical hernia repair with mesh. He is doing well and has no complaints. Review of Systems     Objective:   Physical Exam On exam, his incision is healing well without evidence of infection    Assessment:     Patient status post umbilical hernia repair with mesh    Plan:      He will refrain from heavy lifting for one more week. I will see him back as needed

## 2012-12-10 ENCOUNTER — Encounter: Payer: Self-pay | Admitting: *Deleted

## 2013-02-08 ENCOUNTER — Ambulatory Visit: Payer: Self-pay | Admitting: Internal Medicine

## 2013-02-08 DIAGNOSIS — Z7901 Long term (current) use of anticoagulants: Secondary | ICD-10-CM | POA: Insufficient documentation

## 2013-02-08 DIAGNOSIS — Z952 Presence of prosthetic heart valve: Secondary | ICD-10-CM | POA: Insufficient documentation

## 2013-02-16 ENCOUNTER — Other Ambulatory Visit (HOSPITAL_COMMUNITY): Payer: Self-pay | Admitting: Internal Medicine

## 2013-02-16 DIAGNOSIS — I359 Nonrheumatic aortic valve disorder, unspecified: Secondary | ICD-10-CM

## 2013-02-21 ENCOUNTER — Ambulatory Visit (HOSPITAL_COMMUNITY)
Admission: RE | Admit: 2013-02-21 | Discharge: 2013-02-21 | Disposition: A | Discharge status: Home / Self Care | Payer: Medicare Other | Source: Ambulatory Visit | Attending: Internal Medicine | Admitting: Internal Medicine

## 2013-02-21 DIAGNOSIS — Z951 Presence of aortocoronary bypass graft: Secondary | ICD-10-CM | POA: Insufficient documentation

## 2013-02-21 DIAGNOSIS — I10 Essential (primary) hypertension: Secondary | ICD-10-CM | POA: Insufficient documentation

## 2013-02-21 DIAGNOSIS — E785 Hyperlipidemia, unspecified: Secondary | ICD-10-CM | POA: Insufficient documentation

## 2013-02-21 DIAGNOSIS — E119 Type 2 diabetes mellitus without complications: Secondary | ICD-10-CM | POA: Insufficient documentation

## 2013-02-21 DIAGNOSIS — I079 Rheumatic tricuspid valve disease, unspecified: Secondary | ICD-10-CM | POA: Insufficient documentation

## 2013-02-21 DIAGNOSIS — I359 Nonrheumatic aortic valve disorder, unspecified: Secondary | ICD-10-CM

## 2013-02-21 DIAGNOSIS — I080 Rheumatic disorders of both mitral and aortic valves: Secondary | ICD-10-CM | POA: Insufficient documentation

## 2013-02-21 HISTORY — PX: TRANSTHORACIC ECHOCARDIOGRAM: SHX275

## 2013-02-21 NOTE — Progress Notes (Signed)
2D Echo Performed 02/21/2013    Marygrace Drought, RCS

## 2013-04-19 ENCOUNTER — Ambulatory Visit (INDEPENDENT_AMBULATORY_CARE_PROVIDER_SITE_OTHER): Payer: Medicare Other | Admitting: Pharmacist Clinician (PhC)/ Clinical Pharmacy Specialist

## 2013-04-19 VITALS — BP 110/62 | HR 72 | Wt 240.0 lb

## 2013-04-19 DIAGNOSIS — Z952 Presence of prosthetic heart valve: Secondary | ICD-10-CM

## 2013-04-19 DIAGNOSIS — Z954 Presence of other heart-valve replacement: Secondary | ICD-10-CM

## 2013-04-19 DIAGNOSIS — Z7901 Long term (current) use of anticoagulants: Secondary | ICD-10-CM

## 2013-04-19 LAB — POCT INR: INR: 3.9

## 2013-05-18 ENCOUNTER — Ambulatory Visit (INDEPENDENT_AMBULATORY_CARE_PROVIDER_SITE_OTHER): Payer: Medicare Other | Admitting: Pharmacist Clinician (PhC)/ Clinical Pharmacy Specialist

## 2013-05-18 VITALS — BP 100/72 | HR 68

## 2013-05-18 DIAGNOSIS — Z952 Presence of prosthetic heart valve: Secondary | ICD-10-CM

## 2013-05-18 DIAGNOSIS — Z7901 Long term (current) use of anticoagulants: Secondary | ICD-10-CM

## 2013-05-18 DIAGNOSIS — Z954 Presence of other heart-valve replacement: Secondary | ICD-10-CM

## 2013-05-18 LAB — POCT INR: INR: 3.3

## 2013-06-09 ENCOUNTER — Other Ambulatory Visit: Payer: Self-pay | Admitting: Pharmacist Clinician (PhC)/ Clinical Pharmacy Specialist

## 2013-06-09 MED ORDER — WARFARIN SODIUM 5 MG PO TABS: ORAL_TABLET | ORAL | Status: DC

## 2013-06-21 ENCOUNTER — Ambulatory Visit (INDEPENDENT_AMBULATORY_CARE_PROVIDER_SITE_OTHER): Payer: Medicare Other | Admitting: Pharmacist Clinician (PhC)/ Clinical Pharmacy Specialist

## 2013-06-21 DIAGNOSIS — Z954 Presence of other heart-valve replacement: Secondary | ICD-10-CM

## 2013-06-21 DIAGNOSIS — Z952 Presence of prosthetic heart valve: Secondary | ICD-10-CM

## 2013-06-21 DIAGNOSIS — Z7901 Long term (current) use of anticoagulants: Secondary | ICD-10-CM

## 2013-06-21 LAB — POCT INR: INR: 3.6

## 2013-07-19 ENCOUNTER — Ambulatory Visit (INDEPENDENT_AMBULATORY_CARE_PROVIDER_SITE_OTHER): Payer: Medicare Other | Admitting: Pharmacist Clinician (PhC)/ Clinical Pharmacy Specialist

## 2013-07-19 DIAGNOSIS — Z954 Presence of other heart-valve replacement: Secondary | ICD-10-CM

## 2013-07-19 DIAGNOSIS — Z952 Presence of prosthetic heart valve: Secondary | ICD-10-CM

## 2013-07-19 DIAGNOSIS — Z7901 Long term (current) use of anticoagulants: Secondary | ICD-10-CM

## 2013-07-19 LAB — POCT INR: INR: 2.9

## 2013-08-23 ENCOUNTER — Ambulatory Visit (INDEPENDENT_AMBULATORY_CARE_PROVIDER_SITE_OTHER): Payer: Medicare Other | Admitting: Pharmacist Clinician (PhC)/ Clinical Pharmacy Specialist

## 2013-08-23 VITALS — BP 110/62 | HR 60 | Wt 241.6 lb

## 2013-08-23 DIAGNOSIS — Z7901 Long term (current) use of anticoagulants: Secondary | ICD-10-CM

## 2013-08-23 DIAGNOSIS — Z952 Presence of prosthetic heart valve: Secondary | ICD-10-CM

## 2013-08-23 DIAGNOSIS — Z954 Presence of other heart-valve replacement: Secondary | ICD-10-CM

## 2013-08-23 LAB — POCT INR: INR: 3.1

## 2013-08-30 ENCOUNTER — Other Ambulatory Visit: Payer: Self-pay

## 2013-08-30 MED ORDER — ENALAPRIL MALEATE 20 MG PO TABS: 40.0000 mg | ORAL_TABLET | Freq: Every day | ORAL | Status: DC

## 2013-08-30 NOTE — Telephone Encounter (Signed)
Rx was sent to pharmacy electronically. 

## 2013-09-20 ENCOUNTER — Ambulatory Visit (INDEPENDENT_AMBULATORY_CARE_PROVIDER_SITE_OTHER): Payer: Medicare Other | Admitting: Pharmacist Clinician (PhC)/ Clinical Pharmacy Specialist

## 2013-09-20 VITALS — BP 118/60 | HR 60

## 2013-09-20 DIAGNOSIS — Z952 Presence of prosthetic heart valve: Secondary | ICD-10-CM

## 2013-09-20 DIAGNOSIS — Z7901 Long term (current) use of anticoagulants: Secondary | ICD-10-CM

## 2013-09-20 DIAGNOSIS — Z954 Presence of other heart-valve replacement: Secondary | ICD-10-CM

## 2013-09-20 LAB — POCT INR: INR: 3.4

## 2013-10-03 ENCOUNTER — Other Ambulatory Visit: Payer: Self-pay | Admitting: Internal Medicine

## 2013-10-03 DIAGNOSIS — E1110 Type 2 diabetes mellitus with ketoacidosis without coma: Secondary | ICD-10-CM

## 2013-10-03 MED ORDER — GLYBURIDE 5 MG PO TABS: 5.0000 mg | ORAL_TABLET | Freq: Two times a day (BID) | ORAL | Status: DC

## 2013-10-05 ENCOUNTER — Other Ambulatory Visit: Payer: Self-pay | Admitting: Internal Medicine

## 2013-10-18 ENCOUNTER — Ambulatory Visit (INDEPENDENT_AMBULATORY_CARE_PROVIDER_SITE_OTHER): Payer: Medicare Other | Admitting: Pharmacist Clinician (PhC)/ Clinical Pharmacy Specialist

## 2013-10-18 VITALS — BP 130/68 | HR 64 | Wt 242.0 lb

## 2013-10-18 DIAGNOSIS — Z952 Presence of prosthetic heart valve: Secondary | ICD-10-CM

## 2013-10-18 DIAGNOSIS — Z7901 Long term (current) use of anticoagulants: Secondary | ICD-10-CM

## 2013-10-18 DIAGNOSIS — Z954 Presence of other heart-valve replacement: Secondary | ICD-10-CM

## 2013-10-18 LAB — POCT INR: INR: 3.1

## 2013-11-21 ENCOUNTER — Other Ambulatory Visit: Payer: Self-pay | Admitting: Internal Medicine

## 2013-11-22 ENCOUNTER — Ambulatory Visit (INDEPENDENT_AMBULATORY_CARE_PROVIDER_SITE_OTHER): Payer: Medicare Other | Admitting: Pharmacist Clinician (PhC)/ Clinical Pharmacy Specialist

## 2013-11-22 VITALS — BP 104/60 | HR 68

## 2013-11-22 DIAGNOSIS — Z952 Presence of prosthetic heart valve: Secondary | ICD-10-CM

## 2013-11-22 DIAGNOSIS — Z954 Presence of other heart-valve replacement: Secondary | ICD-10-CM

## 2013-11-22 DIAGNOSIS — Z7901 Long term (current) use of anticoagulants: Secondary | ICD-10-CM

## 2013-11-22 LAB — POCT INR: INR: 2.2

## 2013-11-23 ENCOUNTER — Encounter: Payer: Self-pay | Admitting: Internal Medicine

## 2013-11-29 ENCOUNTER — Other Ambulatory Visit: Payer: Self-pay

## 2013-11-29 ENCOUNTER — Encounter: Payer: Self-pay | Admitting: Physician Assistant

## 2013-11-29 ENCOUNTER — Ambulatory Visit (INDEPENDENT_AMBULATORY_CARE_PROVIDER_SITE_OTHER): Payer: Medicare HMO | Admitting: Physician Assistant

## 2013-11-29 VITALS — BP 112/70 | HR 68 | Temp 97.9°F | Resp 16 | Ht 69.5 in | Wt 239.0 lb

## 2013-11-29 DIAGNOSIS — E782 Mixed hyperlipidemia: Secondary | ICD-10-CM

## 2013-11-29 DIAGNOSIS — E785 Hyperlipidemia, unspecified: Secondary | ICD-10-CM

## 2013-11-29 DIAGNOSIS — I10 Essential (primary) hypertension: Secondary | ICD-10-CM

## 2013-11-29 DIAGNOSIS — E119 Type 2 diabetes mellitus without complications: Secondary | ICD-10-CM

## 2013-11-29 DIAGNOSIS — I350 Nonrheumatic aortic (valve) stenosis: Secondary | ICD-10-CM

## 2013-11-29 DIAGNOSIS — E1121 Type 2 diabetes mellitus with diabetic nephropathy: Secondary | ICD-10-CM | POA: Insufficient documentation

## 2013-11-29 DIAGNOSIS — E538 Deficiency of other specified B group vitamins: Secondary | ICD-10-CM | POA: Insufficient documentation

## 2013-11-29 DIAGNOSIS — E1169 Type 2 diabetes mellitus with other specified complication: Secondary | ICD-10-CM | POA: Insufficient documentation

## 2013-11-29 DIAGNOSIS — E559 Vitamin D deficiency, unspecified: Secondary | ICD-10-CM | POA: Insufficient documentation

## 2013-11-29 DIAGNOSIS — A070 Balantidiasis: Secondary | ICD-10-CM

## 2013-11-29 DIAGNOSIS — Z79899 Other long term (current) drug therapy: Secondary | ICD-10-CM

## 2013-11-29 LAB — TSH: TSH: 1.097 u[IU]/mL (ref 0.350–4.500)

## 2013-11-29 LAB — CBC WITH DIFFERENTIAL/PLATELET
Basophils Absolute: 0 10*3/uL (ref 0.0–0.1)
Basophils Relative: 0 % (ref 0–1)
Eosinophils Absolute: 0.1 10*3/uL (ref 0.0–0.7)
Eosinophils Relative: 1 % (ref 0–5)
HCT: 50.2 % (ref 39.0–52.0)
Hemoglobin: 17.5 g/dL — ABNORMAL HIGH (ref 13.0–17.0)
Lymphocytes Relative: 15 % (ref 12–46)
Lymphs Abs: 1.2 10*3/uL (ref 0.7–4.0)
MCH: 31.2 pg (ref 26.0–34.0)
MCHC: 34.9 g/dL (ref 30.0–36.0)
MCV: 89.5 fL (ref 78.0–100.0)
Monocytes Absolute: 0.6 10*3/uL (ref 0.1–1.0)
Monocytes Relative: 7 % (ref 3–12)
Neutro Abs: 6.2 10*3/uL (ref 1.7–7.7)
Neutrophils Relative %: 77 % (ref 43–77)
Platelets: 181 10*3/uL (ref 150–400)
RBC: 5.61 MIL/uL (ref 4.22–5.81)
RDW: 15.1 % (ref 11.5–15.5)
WBC: 8.1 10*3/uL (ref 4.0–10.5)

## 2013-11-29 LAB — LIPID PANEL
Cholesterol: 158 mg/dL (ref 0–200)
HDL: 39 mg/dL — ABNORMAL LOW
LDL Cholesterol: 87 mg/dL (ref 0–99)
Total CHOL/HDL Ratio: 4.1 ratio
Triglycerides: 160 mg/dL — ABNORMAL HIGH
VLDL: 32 mg/dL (ref 0–40)

## 2013-11-29 LAB — MAGNESIUM: Magnesium: 2 mg/dL (ref 1.5–2.5)

## 2013-11-29 LAB — BASIC METABOLIC PANEL WITHOUT GFR
BUN: 13 mg/dL (ref 6–23)
CO2: 27 meq/L (ref 19–32)
Calcium: 9.2 mg/dL (ref 8.4–10.5)
Chloride: 99 meq/L (ref 96–112)
Creat: 0.83 mg/dL (ref 0.50–1.35)
GFR, Est African American: >89 mL/min
GFR, Est Non African American: 89 mL/min
Glucose, Bld: 177 mg/dL — ABNORMAL HIGH (ref 70–99)
Potassium: 4.3 meq/L (ref 3.5–5.3)
Sodium: 138 meq/L (ref 135–145)

## 2013-11-29 LAB — HEPATIC FUNCTION PANEL
ALT: 20 U/L (ref 0–53)
AST: 17 U/L (ref 0–37)
Albumin: 4.5 g/dL (ref 3.5–5.2)
Alkaline Phosphatase: 85 U/L (ref 39–117)
Bilirubin, Direct: 0.3 mg/dL (ref 0.0–0.3)
Indirect Bilirubin: 1.1 mg/dL — ABNORMAL HIGH (ref 0.0–0.9)
Total Bilirubin: 1.4 mg/dL — ABNORMAL HIGH (ref 0.3–1.2)
Total Protein: 7.3 g/dL (ref 6.0–8.3)

## 2013-11-29 LAB — HEMOGLOBIN A1C
Hgb A1c MFr Bld: 9.7 % — ABNORMAL HIGH
Mean Plasma Glucose: 232 mg/dL — ABNORMAL HIGH

## 2013-11-29 MED ORDER — CLOTRIMAZOLE-BETAMETHASONE 1-0.05 % EX CREA: 1.0000 "application " | TOPICAL_CREAM | Freq: Two times a day (BID) | CUTANEOUS | Status: DC

## 2013-11-29 MED ORDER — FLUCONAZOLE 150 MG PO TABS: 150.0000 mg | ORAL_TABLET | Freq: Every day | ORAL | Status: DC

## 2013-11-29 MED ORDER — MECLIZINE HCL 25 MG PO TABS: 25.0000 mg | ORAL_TABLET | Freq: Three times a day (TID) | ORAL | Status: AC | PRN

## 2013-11-29 NOTE — Patient Instructions (Signed)
Balanitis Balanitis is inflammation of the head of the penis (glans).  CAUSES  Balanitis has multiple causes, both infectious and noninfectious. Frequently balanitis is the result of poor personal hygiene, especially in uncircumcised males. Without adequate washing, viruses, bacteria, and yeast collect between the foreskin and the glans. This can cause an infection. Lack of air and irritation from a normal secretion called smegma contribute to the cause in uncircumcised males. Other causes include:  Chemical irritation from the use of certain soaps and shower gels (especially soaps with perfumes), condoms, personal lubricants, petroleum jelly, spermicides, and fabric conditioners.  Skin conditions, such as eczema, dermatitis, and psoriasis.  Allergies to drugs, such as tetracycline and sulfa.  Certain medical conditions, including liver cirrhosis, congestive heart failure, and kidney disease.  Morbid obesity. RISK FACTORS  Diabetes mellitus.  Phimosis A tight foreskin that is difficult to pull back past the glans.  Sex without the use of a condom. SIGNS AND SYMPTOMS  Symptoms may include:  Discharge coming from under the foreskin.  Tenderness.  Itching and inability to get an erection (because of the pain).  Redness and a rash.  Sores on the glans and on the foreskin. DIAGNOSIS Diagnosis of balanitis is confirmed through a physical exam. TREATMENT The treatment is based on the cause of the balanitis. Treatment may include frequent cleansing, keeping the glans and foreskin dry, use of medicines such as creams, pain medicines, antibiotics, or medicines to treat fungal infections. Sitz baths may be used. If the irritation has caused a scar on the foreskin that prevents easy retraction, a circumcision may be recommended.  HOME CARE INSTRUCTIONS  Sex should be avoided until the condition has cleared. Document Released: 03/29/2009 Document Revised: 07/13/2013 Document Reviewed:  05/02/2013 [facility] Patient Information 2014 [place], [place].    Bad carbs also include fruit juice, alcohol, and sweet tea. These are empty calories that do not signal to your brain that you are full.   Please remember the good carbs are still carbs which convert into sugar. So please measure them out no more than 1/2-1 cup of rice, oatmeal, pasta, and beans.  Veggies are however free foods! Pile them on.   I like lean protein at every meal such as chicken, [place], pork chops, cottage cheese, etc. Just do not fry these meats and please center your meal around vegetable, the meats should be a side dish.   No all fruit is created equal. Please see the list below, the fruit at the bottom is higher in sugars than the fruit at the top

## 2013-11-29 NOTE — Progress Notes (Signed)
HPI Patient presents for 3 month follow up with hypertension, hyperlipidemia, diabetes and vitamin D. Patient's blood pressure has been controlled at home, today their BP is BP: 112/70 mmHg Patient has AORV and follows with the coumadin clinic. Patient denies chest pain, shortness of breath,  nose bleeds, easy bleeding, and blood in stool/urine.  He states for the past week he has been getting vertigo and had nausea in the mornings. This has been on and off for one year. His mother and sister take meclizine in the morning for this. Last for 5-10 mins and can without moving. Denies it happening during the day.  Patient's cholesterol is diet controlled. In addition they are on zocor 40 and denies myalgias. The cholesterol last visit was LDL 71, HDL 34, Trigs 201.  The patient has been working on diet and exercise for Diabetes, and denies changes in vision, polys, and paresthesias. 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 Last A1C was 9.1%.  States that the head of his penis is very red and sensitive to water, this has been intermittent for one year but has stayed for the past week. He has yeast at hs inguinal folds and he has nystatin cream that he has used on it that has not helped. He states he is circumscribed, however after the physical exam he is not circumsribed. He denies joint and vision changes.  Patient is on Vitamin D supplement was 29 and he is now on 2000IU pills 4 times a day but skips occ.  He has switched insurance, he now has [facility] and he needs a referral back to his cardiologist, Dr. Debara Pickett, who he has been seeing for over 10 years.  Current Medications:  Current Outpatient Prescriptions on File Prior to Visit  Medication Sig Dispense Refill  . aspirin 81 MG tablet Take 81 mg by mouth daily.        . Cholecalciferol (VITAMIN D PO) Take 8,000 Units by mouth daily.       . Dapagliflozin Propanediol  (FARXIGA) 10 MG TABS Take 10 mg by mouth daily.      . enalapril (VASOTEC) 20 MG tablet Take 2 tablets (40 mg total) by mouth daily.  60 tablet  5  . glyBURIDE (DIABETA) 5 MG tablet Take 1 tablet (5 mg total) by mouth 2 (two) times daily with a meal.  60 tablet  3  . hydrochlorothiazide (HYDRODIURIL) 25 MG tablet TAKE 1 TABLET BY MOUTH EVERY DAY  90 tablet  1  . metFORMIN (GLUCOPHAGE) 500 MG tablet 1,000 mg 2 (two) times daily with a meal.       . metoprolol (LOPRESSOR) 50 MG tablet Take 50 mg by mouth daily.        Marland Kitchen omeprazole (PRILOSEC) 20 MG capsule Take 20 mg by mouth daily.        . simvastatin (ZOCOR) 40 MG tablet TAKE 1 TABLET BY MOUTH EVERY DAY  90 tablet  1  . warfarin (COUMADIN) 5 MG tablet Take 1 tablet by mouth daily or as directed  30 tablet  6   No current facility-administered medications on file prior to visit.   Medical History:  Past Medical History  Diagnosis Date  . Hypertension   . GERD (gastroesophageal reflux disease)   . Cancer     prostate  . Anemia   . Type II or unspecified type diabetes mellitus without mention of  complication, not stated as uncontrolled   . Hyperlipidemia   . Umbilical hernia   . AS (aortic stenosis)     valve replacement-st. jude mechanical valve  . CAD (coronary artery disease), native coronary artery     CABG 07/18/2003  . History of nuclear stress test 02/12/10    EF 67%, normal perfusion  . ASHD (arteriosclerotic heart disease)   . Vitamin D deficiency   . Vitamin B12 deficiency   . Morbid obesity    Allergies: No Known Allergies  ROS Constitutional: Denies fever, chills, headaches, insomnia, fatigue, night sweats Eyes: Denies redness, blurred vision, diplopia, discharge, itchy, watery eyes.  ENT: + vertigo Denies congestion, post nasal drip, sore throat, earache, dental pain, Tinnitus, Sinus pain, snoring.  Cardio: Denies chest pain, palpitations, irregular heartbeat, dyspnea, diaphoresis, orthopnea, PND, claudication,  edema Respiratory: denies cough, shortness of breath, wheezing.  Gastrointestinal: Denies dysphagia, heartburn, AB pain/ cramps, N/V, diarrhea, constipation, hematemesis, melena, hematochezia,  hemorrhoids Genitourinary: + redness on tip of his penis Denies dysuria, frequency, urgency, nocturia, hesitancy, discharge, hematuria, flank pain Musculoskeletal: Denies myalgia, stiffness, pain, swelling and strain/sprain. Skin: Denies pruritis, rash, changing in skin lesion Neuro: Denies Weakness, tremor, incoordination, spasms, pain Psychiatric: Denies confusion, memory loss, sensory loss Endocrine: Denies change in weight, skin, hair change, nocturia Diabetic Polys, Denies visual blurring, hyper /hypo glycemic episodes, and paresthesia, Heme/Lymph: Denies Excessive bleeding, bruising, enlarged lymph nodes  Family history- Review and unchanged Social history- Review and unchanged Physical Exam: Filed Vitals:   11/29/13 0949  BP: 112/70  Pulse: 68  Temp: 97.9 F (36.6 C)  Resp: 16   Filed Weights   11/29/13 0949  Weight: 239 lb (108.41 kg)   General Appearance: Well nourished, in no apparent distress. Eyes: PERRLA, EOMs, conjunctiva no swelling or erythema Sinuses: No Frontal/maxillary tenderness ENT/Mouth: Ext aud canals clear, TMs without erythema, bulging. No erythema, swelling, or exudate on post pharynx.  Tonsils not swollen or erythematous. Hearing normal.  Neck: Supple, thyroid normal.  Respiratory: Respiratory effort normal, BS equal bilaterally without rales, rhonchi, wheezing or stridor.  Cardio: RRR with no MRGs. Brisk peripheral pulses with mild edema.  Abdomen: Soft, obese, + BS.  Non tender, no guarding, rebound, hernias, masses. Genitourinary: Penis is uncircumcised, when foreskin retracted the skin is erythematous, tender with small satellite lesions.  Lymphatics: Non tender without lymphadenopathy.  Musculoskeletal: Full ROM, 5/5 strength, normal gait.  Skin: Warm, dry  without rashes, lesions, ecchymosis.  Neuro: Cranial nerves intact. No cerebellar symptoms. Sensation intact.  Psych: Awake and oriented X 3, normal affect  Assessment and Plan:  Hypertension: Continue medication, monitor blood pressure at home. Continue DASH diet. Cholesterol: Continue diet and exercise. Check cholesterol.  Diabetes-Continue diet and exercise. Check A1C. Patient's A1C is getting worse, we had a long discussion about diet and getting serious. 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  Vitamin D Def- check level and continue medications.  AORV/Chronic long term coag therapy- follows with cardio.  Vertigo- check labs, meclizine sent in, patient refused MRI but has negative Neuro exam at this time. If anything changes he will call the office or go to the ER. Balanitis with uncircumcised likely yeast- diflucan 150, clotrim/beta given, hygiene discussed. If this does not help we may need to treat with broad ABX. Follow up in one week.  Continue diet and meds as discussed. Further disposition  pending results of labs. Discussed med's effects and SE's.  60 mins or greater was spent with this patient.   Vicie Mutters 10:15 AM

## 2013-11-30 LAB — VITAMIN D 25 HYDROXY (VIT D DEFICIENCY, FRACTURES): Vit D, 25-Hydroxy: 35 ng/mL (ref 30–89)

## 2013-12-13 ENCOUNTER — Ambulatory Visit (INDEPENDENT_AMBULATORY_CARE_PROVIDER_SITE_OTHER): Payer: Medicare HMO | Admitting: Internal Medicine

## 2013-12-13 ENCOUNTER — Encounter: Payer: Self-pay | Admitting: Internal Medicine

## 2013-12-13 ENCOUNTER — Other Ambulatory Visit: Payer: Self-pay | Admitting: Physician Assistant

## 2013-12-13 VITALS — BP 126/76 | HR 64 | Temp 97.9°F | Resp 16 | Wt 235.6 lb

## 2013-12-13 DIAGNOSIS — E1110 Type 2 diabetes mellitus with ketoacidosis without coma: Secondary | ICD-10-CM

## 2013-12-13 DIAGNOSIS — E119 Type 2 diabetes mellitus without complications: Secondary | ICD-10-CM

## 2013-12-13 DIAGNOSIS — I10 Essential (primary) hypertension: Secondary | ICD-10-CM | POA: Insufficient documentation

## 2013-12-13 MED ORDER — SIMVASTATIN 40 MG PO TABS: ORAL_TABLET | ORAL | Status: DC

## 2013-12-13 MED ORDER — METFORMIN HCL ER 500 MG PO TB24: 1000.0000 mg | ORAL_TABLET | Freq: Two times a day (BID) | ORAL | Status: DC

## 2013-12-13 MED ORDER — GLIPIZIDE 10 MG PO TABS: ORAL_TABLET | ORAL | Status: DC

## 2013-12-13 MED ORDER — GLYBURIDE 5 MG PO TABS: 5.0000 mg | ORAL_TABLET | Freq: Two times a day (BID) | ORAL | Status: DC

## 2013-12-13 MED ORDER — HYDROCHLOROTHIAZIDE 25 MG PO TABS: ORAL_TABLET | ORAL | Status: DC

## 2013-12-13 MED ORDER — METOPROLOL TARTRATE 50 MG PO TABS: 50.0000 mg | ORAL_TABLET | Freq: Every day | ORAL | Status: DC

## 2013-12-13 NOTE — Patient Instructions (Addendum)

## 2013-12-13 NOTE — Progress Notes (Signed)
Patient ID: Billy Reilly, male   DOB: 07-24-1941, 73 y.o.   MRN: [id number]    This very nice 73 y.o. MWM presents for 3 month follow up with Hypertension, Hyperlipidemia, T2 NIDDM and Vitamin D Deficiency.    HTN predates [phone]. BP has been controlled at home. Today's BP: 126/76 mmHg . Patient denies any cardiac type chest pain, palpitations, dyspnea/orthopnea/PND, dizziness, claudication, or dependent edema.s.    Also, the patient has history of T2 NIDDM since 1994 with last A1c of 9.35 2 weeks ago.  He had been on insulin when I first saw him and meds have been adjusted with most recent addition of Farxiga and his weight continues to drop now about 35 # since Oct 2013 off insulin and with med changes. He reports FBG's range 145- 160 mg% and rarely are higher than that. Patient denies any symptoms of reactive hypoglycemia, diabetic polys, paresthesias or visual blurring. He was also treated for an candida balanitis with Diflucan at his OV 2 weeks ago.      Medication List         aspirin 81 MG tablet  Take 81 mg by mouth daily.     clotrimazole-betamethasone cream  Commonly known as:  LOTRISONE  Apply 1 application topically 2 (two) times daily.     enalapril 20 MG tablet  Commonly known as:  VASOTEC  Take 2 tablets (40 mg total) by mouth daily.     FARXIGA 10 MG Tabs  Generic drug:  Dapagliflozin Propanediol  Take 10 mg by mouth daily.     fluconazole 150 MG tablet  Commonly known as:  DIFLUCAN  Take 1 tablet (150 mg total) by mouth daily.     glipiZIDE 10 MG tablet  Commonly known as:  GLUCOTROL  1/2 to 1 tablet 3 x daily before meals for diabetes.     hydrochlorothiazide 25 MG tablet  Commonly known as:  HYDRODIURIL  TAKE 1 TABLET BY MOUTH EVERY DAY     meclizine 25 MG tablet  Commonly known as:  ANTIVERT  Take 1 tablet (25 mg total) by mouth 3 (three) times daily as needed for dizziness or nausea.     metFORMIN 500 MG 24 hr tablet  Commonly known as:   GLUCOPHAGE-XR  Take 2 tablets (1,000 mg total) by mouth 2 (two) times daily with a meal.     metoprolol 50 MG tablet  Commonly known as:  LOPRESSOR  Take 1 tablet (50 mg total) by mouth daily.     omeprazole 20 MG capsule  Commonly known as:  PRILOSEC  Take 20 mg by mouth daily.     simvastatin 40 MG tablet  Commonly known as:  ZOCOR  TAKE 1 TABLET BY MOUTH EVERY DAY     VITAMIN D PO  Take 8,000 Units by mouth daily.     warfarin 5 MG tablet  Commonly known as:  COUMADIN  Take 1 tablet by mouth daily or as directed         No Known Allergies  PMHx:   Past Medical History  Diagnosis Date  . Hypertension   . GERD (gastroesophageal reflux disease)   . Cancer     prostate  . Anemia   . Type II or unspecified type diabetes mellitus without mention of complication, not stated as uncontrolled   . Hyperlipidemia   . Umbilical hernia   . AS (aortic stenosis)     valve replacement-st. jude mechanical valve  . CAD (coronary artery  disease), native coronary artery     CABG 07/18/2003  . History of nuclear stress test 02/12/10    EF 67%, normal perfusion  . ASHD (arteriosclerotic heart disease)   . Vitamin D deficiency   . Vitamin B12 deficiency   . Morbid obesity     FHx:    Reviewed / unchanged  SHx:    Reviewed / unchanged  Systems Review: 12 point Systems Review is negative  BP: 126/76  Pulse: 64  Temp: 97.9 F (36.6 C)  Resp: 16    Estimated body mass index is 34.3 kg/(m^2) as calculated from the following:   Height as of 11/29/13: 5' 9.5" (1.765 m).   Weight as of this encounter: 235 lb 9.6 oz (106.867 kg).  On Exam: Appears well nourished - in no distress. Eyes: PERRLA, EOMs, conjunctiva no swelling or erythema. Sinuses: No frontal/maxillary tenderness ENT/Mouth: EAC's clear, TM's nl w/o erythema, bulging. Nares clear w/o erythema, swelling, exudates. Oropharynx clear without erythema or exudates. Oral hygiene is good. Tongue normal, non obstructing.  Hearing intact.  Neck: Supple. Thyroid nl. Car 2+/2+ without bruits, nodes or JVD. Chest: Respirations nl with BS clear & equal w/o rales, rhonchi, wheezing or stridor.  Cor: Heart sounds normal w/ prosthetic Ao V sounds & regular rate and rhythm without sig. murmurs, gallops, clicks, or rubs. Peripheral pulses normal and equal  without edema.  Abdomen: Soft & bowel sounds normal. Non-tender w/o guarding, rebound, hernias, masses, or organomegaly.  Penile candida apparently resolved. Musculoskeletal: Full ROM all peripheral extremities, joint stability, 5/5 strength, and normal gait.  Skin: Warm, dry without exposed rashes, lesions, ecchymosis apparent.  Neuro: Cranial nerves intact, reflexes equal bilaterally. Sensory-motor testing grossly intact. Tendon reflexes grossly intact.  Pysch: Alert & oriented x 3. Insight and judgement nl & appropriate. No ideations.  Assessment and Plan:  1. Hypertension - Continue monitor blood pressure at home. Continue diet/meds same.  2. Diabetes - continue recommend prudent low glycemic diet, weight control, regular exercise, diabetic monitoring and periodic eye exams.  Will change glyburide 5 mg to glipizide 10 mg 1/2 to 1 tab tid ac and tolerate (for now ) slightly higher glucoses as long as he continues to lose significant amounts of weight  Recommended regular exercise, BP monitoring, weight control, and discussed med and SE's. Recommended labs to assess and monitor clinical status. Further disposition pending results of labs.

## 2013-12-20 ENCOUNTER — Ambulatory Visit (INDEPENDENT_AMBULATORY_CARE_PROVIDER_SITE_OTHER): Payer: Medicare HMO | Admitting: Pharmacist Clinician (PhC)/ Clinical Pharmacy Specialist

## 2013-12-20 VITALS — BP 110/60 | HR 72

## 2013-12-20 DIAGNOSIS — Z954 Presence of other heart-valve replacement: Secondary | ICD-10-CM

## 2013-12-20 DIAGNOSIS — Z952 Presence of prosthetic heart valve: Secondary | ICD-10-CM

## 2013-12-20 DIAGNOSIS — Z7901 Long term (current) use of anticoagulants: Secondary | ICD-10-CM

## 2013-12-20 LAB — POCT INR: INR: 2.8

## 2014-01-17 ENCOUNTER — Ambulatory Visit (INDEPENDENT_AMBULATORY_CARE_PROVIDER_SITE_OTHER): Payer: Medicare HMO | Admitting: Pharmacist Clinician (PhC)/ Clinical Pharmacy Specialist

## 2014-01-17 VITALS — BP 120/68 | HR 68 | Ht 69.0 in | Wt 237.9 lb

## 2014-01-17 DIAGNOSIS — Z952 Presence of prosthetic heart valve: Secondary | ICD-10-CM

## 2014-01-17 DIAGNOSIS — Z7901 Long term (current) use of anticoagulants: Secondary | ICD-10-CM

## 2014-01-17 DIAGNOSIS — Z954 Presence of other heart-valve replacement: Secondary | ICD-10-CM

## 2014-01-17 LAB — POCT INR: INR: 3.2

## 2014-01-17 MED ORDER — WARFARIN SODIUM 5 MG PO TABS: ORAL_TABLET | ORAL | Status: DC

## 2014-01-17 MED ORDER — ENALAPRIL MALEATE 20 MG PO TABS: 40.0000 mg | ORAL_TABLET | Freq: Every day | ORAL | Status: DC

## 2014-02-13 ENCOUNTER — Ambulatory Visit (INDEPENDENT_AMBULATORY_CARE_PROVIDER_SITE_OTHER): Payer: Medicare HMO | Admitting: Internal Medicine

## 2014-02-13 ENCOUNTER — Encounter: Payer: Self-pay | Admitting: Internal Medicine

## 2014-02-13 VITALS — BP 132/80 | HR 63 | Ht 69.0 in | Wt 236.8 lb

## 2014-02-13 DIAGNOSIS — E119 Type 2 diabetes mellitus without complications: Secondary | ICD-10-CM

## 2014-02-13 DIAGNOSIS — I2510 Atherosclerotic heart disease of native coronary artery without angina pectoris: Secondary | ICD-10-CM

## 2014-02-13 DIAGNOSIS — Z952 Presence of prosthetic heart valve: Secondary | ICD-10-CM

## 2014-02-13 DIAGNOSIS — Z954 Presence of other heart-valve replacement: Secondary | ICD-10-CM

## 2014-02-13 DIAGNOSIS — E782 Mixed hyperlipidemia: Secondary | ICD-10-CM

## 2014-02-13 DIAGNOSIS — I10 Essential (primary) hypertension: Secondary | ICD-10-CM

## 2014-02-13 DIAGNOSIS — E785 Hyperlipidemia, unspecified: Secondary | ICD-10-CM

## 2014-02-13 LAB — POCT INR: INR: 3.1

## 2014-02-13 NOTE — Progress Notes (Signed)
OFFICE NOTE  Chief Complaint:  No complaints  Primary Care Physician: Alesia Richards, MD  HPI:  Billy Reilly is a 74 year old gentleman, previously followed by Dr. Rex Kras, with a history of coronary disease. In 2004, he had CABG and replacement of his aortic valve with a St. Jude prosthesis. He had 2-vessel bypass at the time with saphenous vein graft to the first diagonal and saphenous vein graft to the posterior descending by Dr. Prescott Gum, and replacement of the aortic valve with a 22 mm ATS AP valve. He has done well with valve replacement to this point without any recurrent angina, shortness of breath, palpitations, presyncope, or syncopal symptoms. INR checked today in the office is 2.6 and it has been stable on Coumadin without dose adjustments for at least a year. 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 Recently he was started on Farxiga, which is resulted in excellent weight loss as well as control of his blood sugars.  Overall he feels quite well.  PMHx:  Past Medical History  Diagnosis Date  . Hypertension   . GERD (gastroesophageal reflux disease)   . Cancer     prostate  . Anemia   . Type II or unspecified type diabetes mellitus without mention of complication, not stated as uncontrolled   . Hyperlipidemia   . Umbilical hernia   . AS (aortic stenosis)     valve replacement-st. jude mechanical valve  . CAD (coronary artery disease), native coronary artery     CABG 07/18/2003  . History of nuclear stress test 02/12/10    EF 67%, normal perfusion  . ASHD (arteriosclerotic heart disease)   . Vitamin D deficiency   . Vitamin B12 deficiency   . Morbid obesity     Past Surgical History  Procedure Laterality Date  . Aortic valve replacement  07/18/2003    74mm ATS-AP valve  . Hernia repair  [id number]    RIH, umbilical  . Aortic valve replacement (avr)/coronary artery bypass grafting  (cabg)  07/18/2003    AVR; bypass x 2-SVG to first diag, SVG to posterior descending  . Cardiac catheterization  07/13/03    AS-aortic valve gradient 34mmhg, valve area 1.1; obstruction of ostium of posterior lateral branch with obstruction in the ostium of first diagnonal  . Tee without cardioversion  07/18/2003    valve functioning normally, no evidence of perivalvular leak    FAMHx:  Family History  Problem Relation Age of Onset  . Stroke Mother   . Heart attack Father   . Heart disease Father   . Cancer Brother   . Cancer Sister     SOCHx:   reports that he has never smoked. He has never used smokeless tobacco. He reports that he does not drink alcohol or use illicit drugs.  ALLERGIES:  No Known Allergies  ROS: A comprehensive review of systems was negative.  HOME MEDS: Current Outpatient Prescriptions  Medication Sig Dispense Refill  . aspirin 81 MG tablet Take 81 mg by mouth daily.        . Cholecalciferol (VITAMIN D PO) Take 8,000 Units by mouth daily.       . clotrimazole-betamethasone (LOTRISONE) cream Apply 1 application topically 2 (two) times daily.  15 g  2  . Dapagliflozin Propanediol (FARXIGA) 10 MG TABS Take 10 mg by mouth daily.      . enalapril (VASOTEC) 20 MG tablet Take 2 tablets (40  mg total) by mouth daily.  180 tablet  1  . fluconazole (DIFLUCAN) 150 MG tablet Take 150 mg by mouth daily as needed.      Marland Kitchen glipiZIDE (GLUCOTROL) 10 MG tablet Take 10 mg by mouth 3 (three) times daily before meals.      . hydrochlorothiazide (HYDRODIURIL) 25 MG tablet TAKE 1 TABLET BY MOUTH EVERY DAY  90 tablet  0  . meclizine (ANTIVERT) 25 MG tablet Take 1 tablet (25 mg total) by mouth 3 (three) times daily as needed for dizziness or nausea.  90 tablet  1  . metFORMIN (GLUCOPHAGE-XR) 500 MG 24 hr tablet Take 1,000 mg by mouth daily with breakfast.      . metoprolol (LOPRESSOR) 50 MG tablet Take 1 tablet (50 mg total) by mouth daily.  180 tablet  1  . omeprazole (PRILOSEC) 20  MG capsule Take 20 mg by mouth daily.        . simvastatin (ZOCOR) 40 MG tablet TAKE 1 TABLET BY MOUTH EVERY DAY  90 tablet  0  . warfarin (COUMADIN) 5 MG tablet Take 1 tablet by mouth daily or as directed  90 tablet  1   No current facility-administered medications for this visit.    LABS/IMAGING: No results found for this or any previous visit (from the past 48 hour(s)). No results found.  VITALS: BP 132/80  Pulse 63  Ht 5\' 9"  (1.753 m)  Wt 236 lb 12.8 oz (107.412 kg)  BMI 34.95 kg/m2  EXAM: General appearance: alert and no distress Neck: no carotid bruit, no JVD and thyroid not enlarged, symmetric, no tenderness/mass/nodules Lungs: clear to auscultation bilaterally Heart: regular rate and rhythm, S1, S2 normal, no murmur, click, rub or gallop Abdomen: soft, non-tender; bowel sounds normal; no masses,  no organomegaly Extremities: extremities normal, atraumatic, no cyanosis or edema Pulses: 2+ and symmetric Skin: Skin color, texture, turgor normal. No rashes or lesions Neurologic: Grossly normal Psych: Mood, affect normal  EKG: Normal sinus rhythm  ASSESSMENT: 1. CAD status post 2 vessel CABG (SVG to diagonal SVG to PDA) in 2004 2. Status post ATS mechanical aortic valve 3. Lifetime anticoagulation on warfarin 4. HTN 5. Dyslipidemia 6. DM2  PLAN: 1.   Billy Reilly is doing well without any new cardiac problems. He is due for recheck of his warfarin level today. His blood pressure is well controlled his cholesterol has been at goal. He's managed to lose another 15 pounds now after starting on Farxiga. This seems to be helping with his blood sugars as well although a.m. blood sugars are still in the 160 range. I would consider repeat stress testing next year as it's been 5 years since his last stress test and will be 12 years since his bypass. His echocardiogram was performed last year and although the gradients across the aortic valve are high, they are acceptable given the  type of valve that he has.  Plan to continue to see him annually or sooner as necessary.  [name], MD, [facility] Attending Cardiologist [facility]  Vinita Prentiss C 02/13/2014, 11:10 AM

## 2014-02-13 NOTE — Patient Instructions (Signed)
Your physician wants you to follow-up in: 1 year. You will receive a reminder letter in the mail two months in advance. If you don't receive a letter, please call our office to schedule the follow-up appointment.  

## 2014-02-15 ENCOUNTER — Ambulatory Visit (INDEPENDENT_AMBULATORY_CARE_PROVIDER_SITE_OTHER): Payer: Medicare HMO | Admitting: Pharmacist Clinician (PhC)/ Clinical Pharmacy Specialist

## 2014-02-15 DIAGNOSIS — Z954 Presence of other heart-valve replacement: Secondary | ICD-10-CM

## 2014-02-15 DIAGNOSIS — Z952 Presence of prosthetic heart valve: Secondary | ICD-10-CM

## 2014-02-15 DIAGNOSIS — Z7901 Long term (current) use of anticoagulants: Secondary | ICD-10-CM

## 2014-02-28 ENCOUNTER — Ambulatory Visit (INDEPENDENT_AMBULATORY_CARE_PROVIDER_SITE_OTHER): Payer: Medicare HMO | Admitting: Internal Medicine

## 2014-02-28 ENCOUNTER — Encounter: Payer: Self-pay | Admitting: Internal Medicine

## 2014-02-28 VITALS — BP 122/80 | HR 64 | Temp 97.9°F | Resp 16 | Ht 69.5 in | Wt 236.2 lb

## 2014-02-28 DIAGNOSIS — Z79899 Other long term (current) drug therapy: Secondary | ICD-10-CM | POA: Insufficient documentation

## 2014-02-28 DIAGNOSIS — E782 Mixed hyperlipidemia: Secondary | ICD-10-CM

## 2014-02-28 DIAGNOSIS — E785 Hyperlipidemia, unspecified: Secondary | ICD-10-CM

## 2014-02-28 DIAGNOSIS — I10 Essential (primary) hypertension: Secondary | ICD-10-CM

## 2014-02-28 DIAGNOSIS — E559 Vitamin D deficiency, unspecified: Secondary | ICD-10-CM

## 2014-02-28 DIAGNOSIS — E119 Type 2 diabetes mellitus without complications: Secondary | ICD-10-CM

## 2014-02-28 LAB — BASIC METABOLIC PANEL WITHOUT GFR
BUN: 14 mg/dL (ref 6–23)
CO2: 27 meq/L (ref 19–32)
Calcium: 9.2 mg/dL (ref 8.4–10.5)
Chloride: 100 meq/L (ref 96–112)
Creat: 0.94 mg/dL (ref 0.50–1.35)
GFR, Est African American: >89 mL/min
GFR, Est Non African American: 81 mL/min
Glucose, Bld: 137 mg/dL — ABNORMAL HIGH (ref 70–99)
Potassium: 4 meq/L (ref 3.5–5.3)
Sodium: 137 meq/L (ref 135–145)

## 2014-02-28 LAB — TSH: TSH: 1.234 u[IU]/mL (ref 0.350–4.500)

## 2014-02-28 LAB — CBC WITH DIFFERENTIAL/PLATELET
Basophils Absolute: 0 10*3/uL (ref 0.0–0.1)
Basophils Relative: 0 % (ref 0–1)
Eosinophils Absolute: 0.1 10*3/uL (ref 0.0–0.7)
Eosinophils Relative: 2 % (ref 0–5)
HCT: 48.2 % (ref 39.0–52.0)
Hemoglobin: 16.8 g/dL (ref 13.0–17.0)
Lymphocytes Relative: 23 % (ref 12–46)
Lymphs Abs: 1.6 10*3/uL (ref 0.7–4.0)
MCH: 30.9 pg (ref 26.0–34.0)
MCHC: 34.9 g/dL (ref 30.0–36.0)
MCV: 88.6 fL (ref 78.0–100.0)
Monocytes Absolute: 0.5 10*3/uL (ref 0.1–1.0)
Monocytes Relative: 7 % (ref 3–12)
Neutro Abs: 4.8 10*3/uL (ref 1.7–7.7)
Neutrophils Relative %: 68 % (ref 43–77)
Platelets: 183 10*3/uL (ref 150–400)
RBC: 5.44 MIL/uL (ref 4.22–5.81)
RDW: 14.6 % (ref 11.5–15.5)
WBC: 7 10*3/uL (ref 4.0–10.5)

## 2014-02-28 LAB — HEPATIC FUNCTION PANEL
ALT: 17 U/L (ref 0–53)
AST: 16 U/L (ref 0–37)
Albumin: 4.3 g/dL (ref 3.5–5.2)
Alkaline Phosphatase: 78 U/L (ref 39–117)
Bilirubin, Direct: 0.2 mg/dL (ref 0.0–0.3)
Indirect Bilirubin: 1 mg/dL (ref 0.2–1.2)
Total Bilirubin: 1.2 mg/dL (ref 0.2–1.2)
Total Protein: 6.8 g/dL (ref 6.0–8.3)

## 2014-02-28 LAB — LIPID PANEL
Cholesterol: 162 mg/dL (ref 0–200)
HDL: 40 mg/dL
LDL Cholesterol: 93 mg/dL (ref 0–99)
Total CHOL/HDL Ratio: 4.1 ratio
Triglycerides: 146 mg/dL
VLDL: 29 mg/dL (ref 0–40)

## 2014-02-28 LAB — HEMOGLOBIN A1C
Hgb A1c MFr Bld: 9 % — ABNORMAL HIGH
Mean Plasma Glucose: 212 mg/dL — ABNORMAL HIGH

## 2014-02-28 LAB — MAGNESIUM: Magnesium: 1.8 mg/dL (ref 1.5–2.5)

## 2014-02-28 NOTE — Progress Notes (Signed)
Patient ID: Billy Reilly, male   DOB: 1941-04-21, 73 y.o.   MRN: [id number]    This very nice 73 y.o. 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    HTN predates since 72. BP has been controlled at hom with today's BP: 122/80 mmHg. 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 Patient denies any cardiac type chest pain, palpitations, dyspnea/orthopnea/PND, dizziness, claudication, or dependent edema.   Hyperlipidemia is controlled with diet & meds. Last Cholesterol was158, Triglycerides were 160, HDL 39 and LDL 87 in Jan 2015. Patient denies myalgias or other med SE's.    Also, the patient has history of T2 NIDDM  And Stage II CKD with GFR 70 ml/min. Patient has lost 7 # in the last 6 mo and 30 # in the last 1&1/2 yrs. His  last A1c was 9.7% in Jan and he reports his CBG's are usu less than 160 mg%. Patient denies any symptoms of reactive hypoglycemia,paresthesias or visual blurring. He doe s report urinary frequency since starting [place].   Further, Patient has history of Vitamin D Deficiency of 28 in 2012 with last vitamin D of 29 in Oct 2014 and patient was advised to resume his Vit D. Patient supplements vitamin D without any suspected side-effects.  Medication Sig  . aspirin 81 MG tablet Take 81 mg by mouth daily.    . Cholecalciferol (VITAMIN D PO) Take 8,000 Units by mouth daily.   . clotrimazole-betamethasone (LOTRISONE) cream Apply 1 application topically 2 (two) times daily.  . Dapagliflozin Propanediol (FARXIGA) 10 MG TABS Take 10 mg by mouth daily.  . enalapril (VASOTEC) 20 MG tablet Take 2 tablets (40 mg total) by mouth daily.  . fluconazole (DIFLUCAN) 150 MG tablet Take 150 mg by mouth daily as needed.  Marland Kitchen glipiZIDE (GLUCOTROL) 10 MG tablet Take 10 mg by mouth 3 (three) times daily before meals.  . hydrochlorothiazide (HYDRODIURIL) 25 MG tablet TAKE 1  TABLET BY MOUTH EVERY DAY  . meclizine (ANTIVERT) 25 MG tablet Take 1 tablet  by mouth 3 (three) times daily as needed  . metFORMIN (GLUCOPHAGE-XR) 500 MG 24 hr tablet Take 1,000 mg by mouth daily with breakfast.  . metoprolol (LOPRESSOR) 50 MG tablet Take 1 tablet (50 mg total) by mouth daily.  Marland Kitchen omeprazole (PRILOSEC) 20 MG capsule Take 20 mg by mouth daily.    . simvastatin (ZOCOR) 40 MG tablet TAKE 1 TABLET BY MOUTH EVERY DAY  . warfarin (COUMADIN) 5 MG tablet Take 1 tablet by mouth daily or as directed    No Known Allergies  PMHx:   Past Medical History  Diagnosis Date  . Hypertension   . GERD (gastroesophageal reflux disease)   . Cancer     prostate  . Anemia   . T2 NIDDM w/Stage II CKD ( GFR 70 ml/min)   . Hyperlipidemia   . Umbilical hernia   . AS (aortic stenosis)     valve replacement-st. jude mechanical valve  . CAD (coronary artery disease), native coronary artery     CABG 07/18/2003  . History of nuclear stress test 02/12/10    EF 67%, normal perfusion  . ASHD (arteriosclerotic heart disease)   . Vitamin D deficiency   . Vitamin B12 deficiency   . Morbid obesity    FHx:    Reviewed / unchanged  SHx:  Reviewed / unchanged   Systems Review: Constitutional: Denies fever, chills, wt changes, headaches, insomnia, fatigue, night sweats, change in appetite. Eyes: Denies redness, blurred vision, diplopia, discharge, itchy, watery eyes.  ENT: Denies discharge, congestion, post nasal drip, epistaxis, sore throat, earache, hearing loss, dental pain, tinnitus, vertigo, sinus pain, snoring.  CV: Denies chest pain, palpitations, irregular heartbeat, syncope, dyspnea, diaphoresis, orthopnea, PND, claudication, edema. Respiratory: denies cough, dyspnea, DOE, pleurisy, hoarseness, laryngitis, wheezing.  Gastrointestinal: Denies dysphagia, odynophagia, heartburn, reflux, water brash, abdominal pain or cramps, nausea, vomiting, bloating, diarrhea, constipation, hematemesis, melena,  hematochezia,  or hemorrhoids. Genitourinary: Denies dysuria, frequency, urgency, nocturia, hesitancy, discharge, hematuria, flank pain. Musculoskeletal: Denies arthralgias, myalgias, stiffness, jt. swelling, pain, limp, strain/sprain.  Skin: Denies pruritus, rash, hives, warts, acne, eczema, change in skin lesion(s). Neuro: No weakness, tremor, incoordination, spasms, paresthesia, or pain. Psychiatric: Denies confusion, memory loss, or sensory loss. Endo: Denies change in weight, skin, hair change.  Heme/Lymph: No excessive bleeding, bruising, orenlarged lymph nodes.   Exam:    BP 122/80  Pulse 64  Temp 97.9 F   Resp 16  Ht 5' 9.5"   Wt 236 lb 3.2 oz   BMI 34.39 kg/m2  Appears well nourished - in no distress. Eyes: PERRLA, EOMs, conjunctiva no swelling or erythema. Sinuses: No frontal/maxillary tenderness ENT/Mouth: EAC's clear, TM's nl w/o erythema, bulging. Nares clear w/o erythema, swelling, exudates. Oropharynx clear without erythema or exudates. Oral hygiene is good. Tongue normal, non obstructing. Hearing intact.  Neck: Supple. Thyroid nl. Car 2+/2+ without bruits, nodes or JVD. Chest: Respirations nl with BS clear & equal w/o rales, rhonchi, wheezing or stridor.  Cor: Heart sounds normal w/ regular rate and rhythm without sig. Murmurs but he does have prosthetic AoV sounds. Peripheral pulses normal and equal  without edema.  Abdomen: Soft & bowel sounds normal. Non-tender w/o guarding, rebound, hernias, masses, or organomegaly.  Lymphatics: Unremarkable.  Musculoskeletal: Full ROM all peripheral extremities, joint stability, 5/5 strength, and normal gait.  Skin: Warm, dry without exposed rashes, lesions, ecchymosis apparent.  Neuro: Cranial nerves intact, reflexes equal bilaterally. Sensory-motor testing grossly intact. Tendon reflexes grossly intact with  AJ's absent Pysch: Alert & oriented x 3. Insight and judgement nl & appropriate. No ideations.  Assessment and  Plan:  1. Hypertension - Continue monitor blood pressure at home. Continue diet/meds same.  2. Hyperlipidemia - Continue diet/meds, exercise,& lifestyle modifications. Continue monitor periodic cholesterol/liver & renal functions   3. T2 NIDDM - continue recommend prudent low glycemic diet, weight control, regular exercise, diabetic monitoring and periodic eye exams.  4. Vitamin D Deficiency - Continue supplementation.  5. ASHD S/P AoVR  Recommended regular exercise, BP monitoring, weight control, and discussed med and SE's. Recommended labs to assess and monitor clinical status. Further disposition pending results of labs.]

## 2014-02-28 NOTE — Patient Instructions (Signed)

## 2014-03-01 ENCOUNTER — Other Ambulatory Visit: Payer: Self-pay | Admitting: Emergency Medicine

## 2014-03-01 ENCOUNTER — Other Ambulatory Visit: Payer: Self-pay | Admitting: Internal Medicine

## 2014-03-01 LAB — VITAMIN D 25 HYDROXY (VIT D DEFICIENCY, FRACTURES): Vit D, 25-Hydroxy: 39 ng/mL (ref 30–89)

## 2014-03-01 LAB — INSULIN, FASTING: Insulin fasting, serum: 13 u[IU]/mL (ref 3–28)

## 2014-03-01 MED ORDER — OMEPRAZOLE 20 MG PO CPDR: 20.0000 mg | DELAYED_RELEASE_CAPSULE | Freq: Every day | ORAL | Status: DC

## 2014-03-01 MED ORDER — GLUCOSE BLOOD VI STRP: ORAL_STRIP | Status: DC

## 2014-03-09 ENCOUNTER — Other Ambulatory Visit: Payer: Self-pay | Admitting: Physician Assistant

## 2014-03-10 ENCOUNTER — Other Ambulatory Visit: Payer: Self-pay | Admitting: Internal Medicine

## 2014-03-10 ENCOUNTER — Other Ambulatory Visit: Payer: Self-pay | Admitting: *Deleted

## 2014-03-10 MED ORDER — ACCU-CHEK SOFTCLIX LANCET DEV KIT: PACK | Status: DC

## 2014-03-10 MED ORDER — BD SWAB SINGLE USE REGULAR PADS: MEDICATED_PAD | Status: DC

## 2014-03-10 MED ORDER — GLUCOSE BLOOD VI STRP: ORAL_STRIP | Status: DC

## 2014-03-10 MED ORDER — ACCU-CHEK AVIVA PLUS W/DEVICE KIT: PACK | Status: DC

## 2014-03-20 ENCOUNTER — Ambulatory Visit (INDEPENDENT_AMBULATORY_CARE_PROVIDER_SITE_OTHER): Payer: Medicare HMO | Admitting: Pharmacist Clinician (PhC)/ Clinical Pharmacy Specialist

## 2014-03-20 DIAGNOSIS — Z7901 Long term (current) use of anticoagulants: Secondary | ICD-10-CM

## 2014-03-20 DIAGNOSIS — Z954 Presence of other heart-valve replacement: Secondary | ICD-10-CM

## 2014-03-20 DIAGNOSIS — Z952 Presence of prosthetic heart valve: Secondary | ICD-10-CM

## 2014-03-20 LAB — POCT INR: INR: 2.5

## 2014-04-19 ENCOUNTER — Ambulatory Visit (INDEPENDENT_AMBULATORY_CARE_PROVIDER_SITE_OTHER): Payer: Medicare HMO | Admitting: Pharmacist Clinician (PhC)/ Clinical Pharmacy Specialist

## 2014-04-19 DIAGNOSIS — Z7901 Long term (current) use of anticoagulants: Secondary | ICD-10-CM

## 2014-04-19 DIAGNOSIS — Z954 Presence of other heart-valve replacement: Secondary | ICD-10-CM

## 2014-04-19 DIAGNOSIS — Z952 Presence of prosthetic heart valve: Secondary | ICD-10-CM

## 2014-04-19 LAB — POCT INR: INR: 3

## 2014-05-17 ENCOUNTER — Ambulatory Visit (INDEPENDENT_AMBULATORY_CARE_PROVIDER_SITE_OTHER): Payer: Medicare HMO | Admitting: Pharmacist Clinician (PhC)/ Clinical Pharmacy Specialist

## 2014-05-17 DIAGNOSIS — Z952 Presence of prosthetic heart valve: Secondary | ICD-10-CM

## 2014-05-17 DIAGNOSIS — Z954 Presence of other heart-valve replacement: Secondary | ICD-10-CM

## 2014-05-17 DIAGNOSIS — Z7901 Long term (current) use of anticoagulants: Secondary | ICD-10-CM

## 2014-05-17 LAB — POCT INR: INR: 4

## 2014-05-30 ENCOUNTER — Encounter: Payer: Self-pay | Admitting: Physician Assistant

## 2014-05-30 ENCOUNTER — Ambulatory Visit (INDEPENDENT_AMBULATORY_CARE_PROVIDER_SITE_OTHER): Payer: Commercial Managed Care - HMO | Admitting: Physician Assistant

## 2014-05-30 VITALS — BP 120/78 | HR 64 | Temp 98.1°F | Resp 16 | Ht 69.5 in | Wt 239.0 lb

## 2014-05-30 DIAGNOSIS — Z7901 Long term (current) use of anticoagulants: Secondary | ICD-10-CM

## 2014-05-30 DIAGNOSIS — Z79899 Other long term (current) drug therapy: Secondary | ICD-10-CM

## 2014-05-30 DIAGNOSIS — E1129 Type 2 diabetes mellitus with other diabetic kidney complication: Secondary | ICD-10-CM

## 2014-05-30 DIAGNOSIS — I10 Essential (primary) hypertension: Secondary | ICD-10-CM

## 2014-05-30 DIAGNOSIS — Z1331 Encounter for screening for depression: Secondary | ICD-10-CM

## 2014-05-30 DIAGNOSIS — E559 Vitamin D deficiency, unspecified: Secondary | ICD-10-CM

## 2014-05-30 DIAGNOSIS — E538 Deficiency of other specified B group vitamins: Secondary | ICD-10-CM

## 2014-05-30 DIAGNOSIS — Z0000 Encounter for general adult medical examination without abnormal findings: Secondary | ICD-10-CM

## 2014-05-30 DIAGNOSIS — E785 Hyperlipidemia, unspecified: Secondary | ICD-10-CM

## 2014-05-30 DIAGNOSIS — Z789 Other specified health status: Secondary | ICD-10-CM

## 2014-05-30 LAB — CBC WITH DIFFERENTIAL/PLATELET
Basophils Absolute: 0 10*3/uL (ref 0.0–0.1)
Basophils Relative: 0 % (ref 0–1)
Eosinophils Absolute: 0.1 10*3/uL (ref 0.0–0.7)
Eosinophils Relative: 2 % (ref 0–5)
HCT: 47 % (ref 39.0–52.0)
Hemoglobin: 16.6 g/dL (ref 13.0–17.0)
Lymphocytes Relative: 24 % (ref 12–46)
Lymphs Abs: 1.6 10*3/uL (ref 0.7–4.0)
MCH: 30.6 pg (ref 26.0–34.0)
MCHC: 35.3 g/dL (ref 30.0–36.0)
MCV: 86.7 fL (ref 78.0–100.0)
Monocytes Absolute: 0.5 10*3/uL (ref 0.1–1.0)
Monocytes Relative: 8 % (ref 3–12)
Neutro Abs: 4.4 10*3/uL (ref 1.7–7.7)
Neutrophils Relative %: 66 % (ref 43–77)
Platelets: 159 10*3/uL (ref 150–400)
RBC: 5.42 MIL/uL (ref 4.22–5.81)
RDW: 15.1 % (ref 11.5–15.5)
WBC: 6.7 10*3/uL (ref 4.0–10.5)

## 2014-05-30 LAB — HEMOGLOBIN A1C
Hgb A1c MFr Bld: 9.2 % — ABNORMAL HIGH
Mean Plasma Glucose: 217 mg/dL — ABNORMAL HIGH

## 2014-05-30 MED ORDER — CLOTRIMAZOLE-BETAMETHASONE 1-0.05 % EX CREA: 1.0000 "application " | TOPICAL_CREAM | Freq: Two times a day (BID) | CUTANEOUS | Status: DC

## 2014-05-30 NOTE — Patient Instructions (Addendum)
   Bad carbs also include fruit juice, alcohol, and sweet tea. These are empty calories that do not signal to your brain that you are full.   Please remember the good carbs are still carbs which convert into sugar. So please measure them out no more than 1/2-1 cup of rice, oatmeal, pasta, and beans.  Veggies are however free foods! Pile them on.   I like lean protein at every meal such as chicken, [place], pork chops, cottage cheese, etc. Just do not fry these meats and please center your meal around vegetable, the meats should be a side dish.   Not all fruit is created equal. Please see the list below, the fruit at the bottom is higher in sugars than the fruit at the top   Cut the HCTZ/hydrochlorthiazide in half or try it every other day and can try to stop it, monitor your blood pressure and swelling.  Can take 1 glipizide in the morning and continue 1/2 lunch and dinner.

## 2014-05-30 NOTE — Progress Notes (Signed)
MEDICARE ANNUAL WELLNESS VISIT AND FOLLOW UP Assessment:   1. Hypertension - CBC with Differential - BASIC METABOLIC PANEL WITH GFR - Hepatic function panel - TSH  2. Vitamin B12 deficiency Continue injections  3. T2 NIDDM w/Stage II CKD (GFR 70 ML/min) Discussed general issues about diabetes pathophysiology and management., Educational material distributed., Suggested low cholesterol diet., Encouraged aerobic exercise., Discussed foot care., Reminded to get yearly retinal exam. - Hemoglobin A1c  4. Encounter for long-term (current) use of other medications - Magnesium  5. Hyperlipidemia - Lipid panel  6. Long term (current) use of anticoagulants Continue to follow up at coumadin clinic  7. Morbid obesity Obesity with co morbidities- long discussion about weight loss, diet, and exercise  8. Vitamin D deficiency - Vit D  25 hydroxy (rtn osteoporosis monitoring)  9. Urinary Frequency denies incontinence Can decrease HCTZ since on farxiga now as well, call if swelling or BP elevated.    Plan:   During the course of the visit the patient was educated and counseled about appropriate screening and preventive services including:    Pneumococcal vaccine   Influenza vaccine  Td vaccine  Screening electrocardiogram  Colorectal cancer screening  Diabetes screening  Glaucoma screening  Nutrition counseling   Screening recommendations, referrals: Vaccinations: Tdap vaccine not indicated Influenza vaccine not indicated Pneumococcal vaccine not indicated Shingles vaccine declined Hep B vaccine not indicated  Nutrition assessed and recommended  Colonoscopy requested Recommended yearly ophthalmology/optometry visit for glaucoma screening and checkup Recommended yearly dental visit for hygiene and checkup Advanced directives - requested  Conditions/risks identified: BMI: Discussed weight loss, diet, and increase physical activity.  Increase physical activity: AHA  recommends 150 minutes of physical activity a week.  Medications reviewed Diabetes is not at goal, ACE/ARB therapy: Yes. Urinary Incontinence is not an issue: discussed non pharmacology and pharmacology options.  Fall risk: moderate- discussed PT, home fall assessment, medications.    Subjective:  Billy Reilly is a 73 y.o. male who presents for Medicare Annual Wellness Visit and 3 month follow up for HTN, hyperlipidemia, diabetes, and vitamin D Def.  Date of last medicare wellness visit was is unknown.  His blood pressure has been controlled at home, today their BP is BP: 120/78 mmHg He does not workout. He denies chest pain, shortness of breath, dizziness.  He is on cholesterol medication and denies myalgias. His cholesterol is at goal. The cholesterol last visit was:   Lab Results  Component Value Date   CHOL 162 02/28/2014   HDL 40 02/28/2014   LDLCALC 93 02/28/2014   TRIG 146 02/28/2014   CHOLHDL 4.1 02/28/2014   He has been working on diet and exercise for diabetes, his sugars have been around 180 for the past 3 months, he has been taking 1/2 of the glipizide 3 times a day and denies polydipsia and polyuria. 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  Patient is on Vitamin D supplement.   Lab Results  Component Value Date   VD25OH 39 02/28/2014      Names of Other Physician/Practitioners you currently use: 1. [facility] here for primary care 2. Unknown eye doctor, last visit several years 3. Dr. Anne Hahn, dentist, last visit q 6 months Patient Care Team: Unk Pinto, MD as PCP - General (Internal Medicine) [name], MD as Consulting Physician (Cardiology) Jenny Reichmann  Keene Breath, MD as Attending Physician (Urology) Winfield Cunas., MD as Consulting Physician (Gastroenterology)  Medication Review: Current Outpatient Prescriptions on File Prior  to Visit  Medication Sig Dispense Refill  . aspirin 81 MG tablet Take 81 mg by mouth daily.        . cyanocobalamin (,VITAMIN B-12,) 1000 MCG/ML injection INJECT 1/2 ML EVERY MONTH  10 mL  1  . enalapril (VASOTEC) 20 MG tablet Take 2 tablets (40 mg total) by mouth daily.  180 tablet  1  . FARXIGA 10 MG TABS TAKE 1 TABLET BY MOUTH EVERY DAY FOR DIABETES  30 tablet  PRN  . glipiZIDE (GLUCOTROL) 10 MG tablet Take 10 mg by mouth 3 (three) times daily before meals.      . hydrochlorothiazide (HYDRODIURIL) 25 MG tablet TAKE 1 TABLET  EVERY DAY  90 tablet  0  . meclizine (ANTIVERT) 25 MG tablet Take 1 tablet (25 mg total) by mouth 3 (three) times daily as needed for dizziness or nausea.  90 tablet  1  . metFORMIN (GLUCOPHAGE-XR) 500 MG 24 hr tablet Take 1,000 mg by mouth 2 (two) times daily with a meal.       . metoprolol (LOPRESSOR) 50 MG tablet Take 1 tablet (50 mg total) by mouth daily.  180 tablet  1  . nystatin (MYCOSTATIN/NYSTOP) 100000 UNIT/GM POWD Apply as directed      . omeprazole (PRILOSEC) 20 MG capsule Take 1 capsule (20 mg total) by mouth daily.  90 capsule  2  . simvastatin (ZOCOR) 40 MG tablet TAKE 1 TABLET  EVERY DAY  90 tablet  0  . warfarin (COUMADIN) 5 MG tablet Take 1 tablet by mouth daily or as directed  90 tablet  1   No current facility-administered medications on file prior to visit.    Current Problems (verified) Patient Active Problem List   Diagnosis Date Noted  . Encounter for long-term (current) use of other medications 02/28/2014  . Hypertension 12/13/2013  . Hyperlipidemia   . T2 NIDDM w/Stage II CKD (GFR 70 ML/min)   . Vitamin D deficiency   . Vitamin B12 deficiency   . Morbid obesity   . Long term (current) use of anticoagulants 02/08/2013  . S/P AVR 02/08/2013  . Umbilical hernia without mention of obstruction or gangrene 09/02/2011    Screening Tests Health Maintenance  Topic Date Due  . Zostavax  09/02/2002  . Influenza Vaccine  06/24/2014  .  Tetanus/tdap  11/24/2017  . Colonoscopy  10/14/2020  . Pneumococcal Polysaccharide Vaccine Age 87 And Over  Completed    Immunization History  Administered Date(s) Administered  . Influenza Whole 09/01/2012  . Pneumococcal Polysaccharide-23 09/01/2012  . Tdap 11/25/2007   Preventative care: Last colonoscopy: 2011 Virtual colonoscopy  Prior vaccinations: TD or Tdap: 2009 Influenza: 2014 Pneumococcal: 2013 Shingles/Zostavax: declines Prevnar 13: declines  History reviewed: allergies, current medications, past family history, past medical history, past social history, past surgical history and problem list  Risk Factors: Tobacco History  Substance Use Topics  . Smoking status: Never Smoker   . Smokeless tobacco: Never Used  . Alcohol Use: No   He does not smoke.  Patient is not a former smoker. Are there smokers in your home (other than you)?  No  Alcohol Current alcohol use: none  Caffeine Current caffeine use: coffee 1 /day  Exercise Current exercise: none  Nutrition/Diet Current diet: in general, an "unhealthy" diet  Cardiac risk factors: advanced age (older than 22  for men, 52 for women), diabetes mellitus, dyslipidemia, family history of premature cardiovascular disease, hypertension, male gender, obesity (BMI >= 30 kg/m2) and sedentary lifestyle.  Depression Screen (Note: if answer to either of the following is "Yes", a more complete depression screening is indicated)   Q1: Over the past two weeks, have you felt down, depressed or hopeless? No  Q2: Over the past two weeks, have you felt little interest or pleasure in doing things? No  Have you lost interest or pleasure in daily life? No  Do you often feel hopeless? No  Do you cry easily over simple problems? No  Activities of Daily Living In your present state of health, do you have any difficulty performing the following activities?:  Driving? No Managing money?  No Feeding yourself? No Getting from  bed to chair? No Climbing a flight of stairs? No Preparing food and eating?: No Bathing or showering? No Getting dressed: No Getting to the toilet? No Using the toilet:No Moving around from place to place: No In the past year have you fallen or had a near fall?:No   Are you sexually active?  No  Do you have more than one partner?  No  Vision Difficulties: No  Hearing Difficulties: No Do you often ask people to speak up or repeat themselves? No Do you experience ringing or noises in your ears? No Do you have difficulty understanding soft or whispered voices? No  Cognition  Do you feel that you have a problem with memory?No  Do you often misplace items? No  Do you feel safe at home?  Yes  Advanced directives Does patient have a [place]? Yes Does patient have a Living Will? Yes   Objective:   Blood pressure 120/78, pulse 64, temperature 98.1 F (36.7 C), resp. rate 16, height 5' 9.5" (1.765 m), weight 239 lb (108.41 kg). Body mass index is 34.8 kg/(m^2).  General appearance: alert, no distress, WD/WN, male Cognitive Testing  Alert? Yes  Normal Appearance?Yes  Oriented to person? Yes  Place? Yes   Time? Yes  Recall of three objects?  Yes  Can perform simple calculations? Yes  Displays appropriate judgment?Yes  Can read the correct time from a watch face?Yes  HEENT: normocephalic, sclerae anicteric, TMs pearly, nares patent, no discharge or erythema, pharynx normal Oral cavity: MMM, no lesions Neck: supple, no lymphadenopathy, no thyromegaly, no masses Heart: RRR, normal S1, S2, with artifical click best heart LSB no murmurs Lungs: CTA bilaterally, no wheezes, rhonchi, or rales Abdomen: +bs, soft, obese non tender, non distended, no masses, no hepatomegaly, no splenomegaly Musculoskeletal: nontender, no swelling, no obvious deformity Extremities: no edema, no cyanosis, no clubbing Pulses: 2+ symmetric, upper and lower extremities, normal cap  refill Neurological: alert, oriented x 3, CN2-12 intact, strength normal upper extremities and lower extremities, sensation normal throughout, DTRs 2+ throughout, no cerebellar signs, gait normal Psychiatric: normal affect, behavior normal, pleasant   Medicare Attestation I have personally reviewed: The patient's medical and social history Their use of alcohol, tobacco or illicit drugs Their current medications and supplements The patient's functional ability including ADLs,fall risks, home safety risks, cognitive, and hearing and visual impairment Diet and physical activities Evidence for depression or mood disorders  The patient's weight, height, BMI, and visual acuity have been recorded in the chart.  I have made referrals, counseling, and provided education to the patient based on review of the above and I have provided the patient with a written personalized care  plan for preventive services.     Vicie Mutters, PA-C   05/30/2014

## 2014-05-31 LAB — MAGNESIUM: Magnesium: 2 mg/dL (ref 1.5–2.5)

## 2014-05-31 LAB — VITAMIN D 25 HYDROXY (VIT D DEFICIENCY, FRACTURES): Vit D, 25-Hydroxy: 39 ng/mL (ref 30–89)

## 2014-05-31 LAB — HEPATIC FUNCTION PANEL
ALT: 18 U/L (ref 0–53)
AST: 16 U/L (ref 0–37)
Albumin: 4.5 g/dL (ref 3.5–5.2)
Alkaline Phosphatase: 77 U/L (ref 39–117)
Bilirubin, Direct: 0.2 mg/dL (ref 0.0–0.3)
Indirect Bilirubin: 0.9 mg/dL (ref 0.2–1.2)
Total Bilirubin: 1.1 mg/dL (ref 0.2–1.2)
Total Protein: 6.7 g/dL (ref 6.0–8.3)

## 2014-05-31 LAB — BASIC METABOLIC PANEL WITHOUT GFR
BUN: 11 mg/dL (ref 6–23)
CO2: 26 meq/L (ref 19–32)
Calcium: 9.1 mg/dL (ref 8.4–10.5)
Chloride: 99 meq/L (ref 96–112)
Creat: 0.87 mg/dL (ref 0.50–1.35)
GFR, Est African American: >89 mL/min
GFR, Est Non African American: 87 mL/min
Glucose, Bld: 159 mg/dL — ABNORMAL HIGH (ref 70–99)
Potassium: 4.3 meq/L (ref 3.5–5.3)
Sodium: 137 meq/L (ref 135–145)

## 2014-05-31 LAB — LIPID PANEL
Cholesterol: 141 mg/dL (ref 0–200)
HDL: 33 mg/dL — ABNORMAL LOW
LDL Cholesterol: 76 mg/dL (ref 0–99)
Total CHOL/HDL Ratio: 4.3 ratio
Triglycerides: 161 mg/dL — ABNORMAL HIGH
VLDL: 32 mg/dL (ref 0–40)

## 2014-05-31 LAB — TSH: TSH: 1.184 u[IU]/mL (ref 0.350–4.500)

## 2014-06-19 ENCOUNTER — Other Ambulatory Visit: Payer: Self-pay | Admitting: Physician Assistant

## 2014-06-19 ENCOUNTER — Other Ambulatory Visit: Payer: Self-pay | Admitting: Internal Medicine

## 2014-06-20 ENCOUNTER — Ambulatory Visit (INDEPENDENT_AMBULATORY_CARE_PROVIDER_SITE_OTHER): Payer: Commercial Managed Care - HMO | Admitting: Pharmacist Clinician (PhC)/ Clinical Pharmacy Specialist

## 2014-06-20 DIAGNOSIS — Z7901 Long term (current) use of anticoagulants: Secondary | ICD-10-CM | POA: Diagnosis not present

## 2014-06-20 DIAGNOSIS — Z954 Presence of other heart-valve replacement: Secondary | ICD-10-CM | POA: Diagnosis not present

## 2014-06-20 DIAGNOSIS — Z952 Presence of prosthetic heart valve: Secondary | ICD-10-CM

## 2014-06-20 LAB — POCT INR: INR: 3.1

## 2014-06-20 MED ORDER — ENALAPRIL MALEATE 20 MG PO TABS: 40.0000 mg | ORAL_TABLET | Freq: Every day | ORAL | Status: DC

## 2014-06-20 MED ORDER — WARFARIN SODIUM 5 MG PO TABS: ORAL_TABLET | ORAL | Status: DC

## 2014-07-18 ENCOUNTER — Ambulatory Visit: Payer: Medicare HMO | Admitting: Pharmacist Clinician (PhC)/ Clinical Pharmacy Specialist

## 2014-07-19 ENCOUNTER — Ambulatory Visit (INDEPENDENT_AMBULATORY_CARE_PROVIDER_SITE_OTHER): Payer: Commercial Managed Care - HMO | Admitting: Pharmacist Clinician (PhC)/ Clinical Pharmacy Specialist

## 2014-07-19 DIAGNOSIS — Z954 Presence of other heart-valve replacement: Secondary | ICD-10-CM

## 2014-07-19 DIAGNOSIS — Z7901 Long term (current) use of anticoagulants: Secondary | ICD-10-CM

## 2014-07-19 DIAGNOSIS — Z952 Presence of prosthetic heart valve: Secondary | ICD-10-CM

## 2014-07-19 LAB — POCT INR: INR: 3

## 2014-08-23 ENCOUNTER — Ambulatory Visit (INDEPENDENT_AMBULATORY_CARE_PROVIDER_SITE_OTHER): Payer: Commercial Managed Care - HMO | Admitting: Pharmacist Clinician (PhC)/ Clinical Pharmacy Specialist

## 2014-08-23 DIAGNOSIS — Z7901 Long term (current) use of anticoagulants: Secondary | ICD-10-CM

## 2014-08-23 DIAGNOSIS — Z952 Presence of prosthetic heart valve: Secondary | ICD-10-CM

## 2014-08-23 DIAGNOSIS — Z954 Presence of other heart-valve replacement: Secondary | ICD-10-CM

## 2014-08-23 LAB — POCT INR: INR: 3.3

## 2014-08-25 ENCOUNTER — Other Ambulatory Visit: Payer: Self-pay | Admitting: Physician Assistant

## 2014-09-07 ENCOUNTER — Ambulatory Visit (INDEPENDENT_AMBULATORY_CARE_PROVIDER_SITE_OTHER): Payer: Commercial Managed Care - HMO | Admitting: Internal Medicine

## 2014-09-07 ENCOUNTER — Encounter: Payer: Self-pay | Admitting: Internal Medicine

## 2014-09-07 VITALS — BP 122/68 | HR 64 | Temp 97.5°F | Resp 16 | Ht 69.0 in | Wt 239.4 lb

## 2014-09-07 DIAGNOSIS — E538 Deficiency of other specified B group vitamins: Secondary | ICD-10-CM

## 2014-09-07 DIAGNOSIS — Z79899 Other long term (current) drug therapy: Secondary | ICD-10-CM

## 2014-09-07 DIAGNOSIS — Z1331 Encounter for screening for depression: Secondary | ICD-10-CM

## 2014-09-07 DIAGNOSIS — E785 Hyperlipidemia, unspecified: Secondary | ICD-10-CM

## 2014-09-07 DIAGNOSIS — E559 Vitamin D deficiency, unspecified: Secondary | ICD-10-CM

## 2014-09-07 DIAGNOSIS — R6889 Other general symptoms and signs: Secondary | ICD-10-CM

## 2014-09-07 DIAGNOSIS — Z9181 History of falling: Secondary | ICD-10-CM

## 2014-09-07 DIAGNOSIS — E1121 Type 2 diabetes mellitus with diabetic nephropathy: Secondary | ICD-10-CM

## 2014-09-07 DIAGNOSIS — Z125 Encounter for screening for malignant neoplasm of prostate: Secondary | ICD-10-CM

## 2014-09-07 DIAGNOSIS — Z1212 Encounter for screening for malignant neoplasm of rectum: Secondary | ICD-10-CM

## 2014-09-07 DIAGNOSIS — Z0001 Encounter for general adult medical examination with abnormal findings: Secondary | ICD-10-CM

## 2014-09-07 DIAGNOSIS — I10 Essential (primary) hypertension: Secondary | ICD-10-CM

## 2014-09-07 LAB — CBC WITH DIFFERENTIAL/PLATELET
Basophils Absolute: 0.1 10*3/uL (ref 0.0–0.1)
Basophils Relative: 1 % (ref 0–1)
Eosinophils Absolute: 0.1 10*3/uL (ref 0.0–0.7)
Eosinophils Relative: 2 % (ref 0–5)
HCT: 45.2 % (ref 39.0–52.0)
Hemoglobin: 15.5 g/dL (ref 13.0–17.0)
Lymphocytes Relative: 22 % (ref 12–46)
Lymphs Abs: 1.2 10*3/uL (ref 0.7–4.0)
MCH: 30.3 pg (ref 26.0–34.0)
MCHC: 34.3 g/dL (ref 30.0–36.0)
MCV: 88.3 fL (ref 78.0–100.0)
Monocytes Absolute: 0.4 10*3/uL (ref 0.1–1.0)
Monocytes Relative: 7 % (ref 3–12)
Neutro Abs: 3.7 10*3/uL (ref 1.7–7.7)
Neutrophils Relative %: 68 % (ref 43–77)
Platelets: 154 10*3/uL (ref 150–400)
RBC: 5.12 MIL/uL (ref 4.22–5.81)
RDW: 14.8 % (ref 11.5–15.5)
WBC: 5.5 10*3/uL (ref 4.0–10.5)

## 2014-09-07 LAB — HEMOGLOBIN A1C
Hgb A1c MFr Bld: 8.2 % — ABNORMAL HIGH
Mean Plasma Glucose: 189 mg/dL — ABNORMAL HIGH

## 2014-09-07 NOTE — Patient Instructions (Signed)

## 2014-09-07 NOTE — Progress Notes (Signed)
Patient ID: Billy Reilly, male   DOB: 1941/08/07, 73 y.o.   MRN: [id number]  Annual Preventative & Screening Comprehensive Examination  This very nice 73 y.o.MWM presents for complete physical.  Patient has been followed for HTN, T2_NIDDM , Hyperlipidemia, and Vitamin D Deficiency.   HTN predates since 38. Patient's BP has been controlled at home.Today's BP: 122/68 mmHg.  Patient had a 2V-CABG and AoVR (St Jude) in 2004 and currently is followed by Dr Debara Pickett.and warfarin monitored in his clinic. Patient denies any cardiac symptoms as chest pain, palpitations, shortness of breath, dizziness or ankle swelling.   Patient's hyperlipidemia is controlled with diet and medications. Patient denies myalgias or other medication SE's. Last lipids were  at goal - Total Cholesterol 141; HDL  33; LDL  76; Triglycerides 161 on 05/30/2014.   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 Control is poor commensurate with his compliance. He checks CBG's ~ 2x/week & it's usu less than 180 mg%. Last A1c was  9.2% on 05/30/2014.     Finally, patient has history of Vitamin D Deficiency of 28 in 2012 and last vitamin D was 39 on 05/30/2014.  Medication Sig  . aspirin 81 MG tablet Take 81 mg by mouth daily.    Marland Kitchen LOTRISONE cream Apply 1 application topically 2 (two) times daily.  Marland Kitchen VITAMIN B-12 1000 MCG/ML injection INJECT 1/2 ML EVERY MONTH  . enalapril (VASOTEC) 20 MG tablet Take 2 tablets (40 mg total) by mouth daily.  Marland Kitchen FARXIGA 10 MG TABS TAKE 1 TABLET BY MOUTH EVERY DAY FOR DIABETES  . glipiZIDE  10 MG tablet Take 10 mg by mouth 3 (three) times daily before meals.  . hydrochlorothiazide 25 MG  TAKE 1 TABLET  EVERY DAY  . meclizine 25 MG tab Take 1 tablet 3times daily as needed  . metFORMIN XR 500 MG 24 hr tablet TAKE 2 TABLETS TWICE DAILY   . metoprolol Tartrate 50 MG tablet Take 1 tablet   daily.  Marland Kitchen nystatin 100000 UNIT/GM POWD  Apply as directed  . omeprazole  20 MG capsule Take 1 capsule  daily.  . simvastatin  40 MG tablet TAKE 1 TABLET  EVERY DAY  . warfarin  5 MG tablet Take 1 tablet by  daily or as directed   No Known Allergies  Past Medical History  Diagnosis Date  . Hypertension   . GERD (gastroesophageal reflux disease)   . Cancer     prostate  . Anemia   . Type II or unspecified type diabetes mellitus without mention of complication, not stated as uncontrolled   . Hyperlipidemia   . Umbilical hernia   . AS (aortic stenosis)     valve replacement-st. jude mechanical valve  . CAD (coronary artery disease), native coronary artery     CABG 07/18/2003  . History of nuclear stress test 02/12/10    EF 67%, normal perfusion  . ASHD (arteriosclerotic heart disease)   . Vitamin D deficiency   . Vitamin B12 deficiency   . Morbid obesity    Health Maintenance  Topic Date Due  . Foot Exam  09/02/1952  . Ophthalmology Exam  09/02/1952  . Urine Microalbumin  09/02/1952  . Zostavax  09/02/2002  . Influenza Vaccine  06/24/2014  . Hemoglobin A1c  11/30/2014  . Tetanus/tdap  11/24/2017  . Colonoscopy  10/14/2020  . Pneumococcal Polysaccharide Vaccine Age 45 And Over  Completed   Immunization  History  Administered Date(s) Administered  . Influenza Whole 09/01/2012  . Pneumococcal Polysaccharide-23 09/01/2012  . Tdap 11/25/2007   Past Surgical History  Procedure Laterality Date  . Aortic valve replacement  07/18/2003    15mm ATS-AP valve  . Hernia repair  [id number]    RIH, umbilical  . Aortic valve replacement (avr)/coronary artery bypass grafting (cabg)  07/18/2003    AVR; bypass x 2-SVG to first diag, SVG to posterior descending  . Cardiac catheterization  07/13/03    AS-aortic valve gradient 19mmhg, valve area 1.1; obstruction of ostium of posterior lateral branch with obstruction in the ostium of first diagnonal  . Tee without cardioversion  07/18/2003    valve functioning normally, no evidence  of perivalvular leak   Family History  Problem Relation Age of Onset  . Stroke Mother   . Heart attack Father   . Heart disease Father   . Cancer Brother   . Cancer Sister     History   Social History  . Marital Status: Married    Spouse Name: N/A    Number of Children: N/A  . Years of Education: N/A   Occupational History  . Truck Stage manager.   Social History Main Topics  . Smoking status: Never Smoker   . Smokeless tobacco: Never Used  . Alcohol Use: No  . Drug Use: No  . Sexual Activity: Not on file    ROS Constitutional: Denies fever, chills, weight loss/gain, headaches, insomnia, fatigue, night sweats or change in appetite. Eyes: Denies redness, blurred vision, diplopia, discharge, itchy or watery eyes.  ENT: Denies discharge, congestion, post nasal drip, epistaxis, sore throat, earache, hearing loss, dental pain, Tinnitus, Vertigo, Sinus pain or snoring.  Cardio: Denies chest pain, palpitations, irregular heartbeat, syncope, dyspnea, diaphoresis, orthopnea, PND, claudication or edema Respiratory: denies cough, dyspnea, DOE, pleurisy, hoarseness, laryngitis or wheezing.  Gastrointestinal: Denies dysphagia, heartburn, reflux, water brash, pain, cramps, nausea, vomiting, bloating, diarrhea, constipation, hematemesis, melena, hematochezia, jaundice or hemorrhoids Genitourinary: Denies dysuria, frequency, urgency, nocturia, hesitancy, discharge, hematuria or flank pain Musculoskeletal: Denies arthralgia, myalgia, stiffness, Jt. Swelling, pain, limp or strain/sprain. Denies Falls. Skin: Denies puritis, rash, hives, warts, acne, eczema or change in skin lesion Neuro: No weakness, tremor, incoordination, spasms, paresthesia or pain Psychiatric: Denies confusion, memory loss or sensory loss. Denies Depression. Endocrine: Denies change in weight, skin, hair change, nocturia, and paresthesia, diabetic polys, visual blurring or hyper / hypo glycemic episodes.   Heme/Lymph: No excessive bleeding, bruising or enlarged lymph nodes.  Physical Exam  BP 122/68  Pulse 64  Temp(Src) 97.5 F (36.4 C) (Temporal)  Resp 16  Ht 5\' 9"  (1.753 m)  Wt 239 lb 6.4 oz (108.591 kg)  BMI 35.34 kg/m2  General Appearance: Well nourished, in no apparent distress. Eyes: PERRLA, EOMs, conjunctiva no swelling or erythema, normal fundi and vessels. Sinuses: No frontal/maxillary tenderness ENT/Mouth: EACs patent / TMs  nl. Nares clear without erythema, swelling, mucoid exudates. Oral hygiene is good. No erythema, swelling, or exudate. Tongue normal, non-obstructing. Tonsils not swollen or erythematous. Hearing normal.  Neck: Supple, thyroid normal. No bruits, nodes or JVD. Respiratory: Respiratory effort normal.  BS equal and clear bilateral without rales, rhonci, wheezing or stridor. Cardio: Heart sounds are normal with regular rate and rhythm and no murmurs, rubs or gallops. Peripheral pulses are normal and equal bilaterally without edema. No aortic or femoral bruits. Chest: symmetric with normal excursions and percussion.  Abdomen: Flat, soft, with bowl sounds. Nontender, no guarding,  rebound, hernias, masses, or organomegaly.  Lymphatics: Non tender without lymphadenopathy.  Genitourinary: No hernias.Testes nl. DRE - prostate nl for age - smooth & firm w/o nodules. Musculoskeletal: Full ROM all peripheral extremities, joint stability, 5/5 strength, and normal gait. Skin: Warm and dry without rashes, lesions, cyanosis, clubbing or  ecchymosis.  Neuro: Cranial nerves intact, reflexes equal bilaterally. Normal muscle tone, no cerebellar symptoms. Sensation intact.  Pysch: Awake and oriented X 3with normal affect, insight and judgment appropriate.   Assessment and Plan  1. Annual Screening Examination 2. Hypertension  3. Hyperlipidemia 4. 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 Vitamin D Deficiency 6. B12 Deficiency   Continue prudent diet as discussed, weight control, BP monitoring,  regular exercise, and medications as discussed.  Discussed med effects and SE's. Routine screening labs and tests as requested with regular follow-up as recommended.

## 2014-09-08 LAB — URINALYSIS, MICROSCOPIC ONLY
Bacteria, UA: NONE SEEN
Casts: NONE SEEN
Crystals: NONE SEEN
Squamous Epithelial / HPF: NONE SEEN

## 2014-09-08 LAB — BASIC METABOLIC PANEL WITHOUT GFR
BUN: 8 mg/dL (ref 6–23)
CO2: 25 meq/L (ref 19–32)
Calcium: 9.4 mg/dL (ref 8.4–10.5)
Chloride: 104 meq/L (ref 96–112)
Creat: 0.79 mg/dL (ref 0.50–1.35)
GFR, Est African American: >89 mL/min
GFR, Est Non African American: >89 mL/min
Glucose, Bld: 125 mg/dL — ABNORMAL HIGH (ref 70–99)
Potassium: 4.5 meq/L (ref 3.5–5.3)
Sodium: 139 meq/L (ref 135–145)

## 2014-09-08 LAB — HEPATIC FUNCTION PANEL
ALT: 18 U/L (ref 0–53)
AST: 19 U/L (ref 0–37)
Albumin: 4.2 g/dL (ref 3.5–5.2)
Alkaline Phosphatase: 80 U/L (ref 39–117)
Bilirubin, Direct: 0.2 mg/dL (ref 0.0–0.3)
Indirect Bilirubin: 0.7 mg/dL (ref 0.2–1.2)
Total Bilirubin: 0.9 mg/dL (ref 0.2–1.2)
Total Protein: 6.4 g/dL (ref 6.0–8.3)

## 2014-09-08 LAB — LIPID PANEL
Cholesterol: 135 mg/dL (ref 0–200)
HDL: 38 mg/dL — ABNORMAL LOW
LDL Cholesterol: 77 mg/dL (ref 0–99)
Total CHOL/HDL Ratio: 3.6 ratio
Triglycerides: 98 mg/dL
VLDL: 20 mg/dL (ref 0–40)

## 2014-09-08 LAB — VITAMIN D 25 HYDROXY (VIT D DEFICIENCY, FRACTURES): Vit D, 25-Hydroxy: 34 ng/mL (ref 30–89)

## 2014-09-08 LAB — MAGNESIUM: Magnesium: 1.9 mg/dL (ref 1.5–2.5)

## 2014-09-08 LAB — TSH: TSH: 1.275 u[IU]/mL (ref 0.350–4.500)

## 2014-09-08 LAB — PSA: PSA: 0.04 ng/mL

## 2014-09-08 LAB — MICROALBUMIN / CREATININE URINE RATIO
Creatinine, Urine: 85.8 mg/dL
Microalb Creat Ratio: 5.8 mg/g (ref 0.0–30.0)
Microalb, Ur: 0.5 mg/dL

## 2014-09-08 LAB — VITAMIN B12: Vitamin B-12: 326 pg/mL (ref 211–911)

## 2014-09-08 LAB — INSULIN, FASTING: Insulin fasting, serum: 15.7 u[IU]/mL (ref 2.0–19.6)

## 2014-09-20 ENCOUNTER — Ambulatory Visit (INDEPENDENT_AMBULATORY_CARE_PROVIDER_SITE_OTHER): Payer: Commercial Managed Care - HMO | Admitting: Pharmacist Clinician (PhC)/ Clinical Pharmacy Specialist

## 2014-09-20 DIAGNOSIS — Z954 Presence of other heart-valve replacement: Secondary | ICD-10-CM

## 2014-09-20 DIAGNOSIS — Z952 Presence of prosthetic heart valve: Secondary | ICD-10-CM

## 2014-09-20 DIAGNOSIS — Z7901 Long term (current) use of anticoagulants: Secondary | ICD-10-CM

## 2014-09-20 LAB — POCT INR: INR: 3.1

## 2014-10-09 ENCOUNTER — Other Ambulatory Visit: Payer: Self-pay | Admitting: Physician Assistant

## 2014-10-18 ENCOUNTER — Ambulatory Visit (INDEPENDENT_AMBULATORY_CARE_PROVIDER_SITE_OTHER): Payer: Commercial Managed Care - HMO | Admitting: Pharmacist Clinician (PhC)/ Clinical Pharmacy Specialist

## 2014-10-18 DIAGNOSIS — Z7901 Long term (current) use of anticoagulants: Secondary | ICD-10-CM

## 2014-10-18 DIAGNOSIS — Z954 Presence of other heart-valve replacement: Secondary | ICD-10-CM

## 2014-10-18 DIAGNOSIS — Z952 Presence of prosthetic heart valve: Secondary | ICD-10-CM

## 2014-10-18 LAB — POCT INR: INR: 3.3

## 2014-10-25 ENCOUNTER — Other Ambulatory Visit: Payer: Self-pay | Admitting: *Deleted

## 2014-10-25 DIAGNOSIS — Z1212 Encounter for screening for malignant neoplasm of rectum: Secondary | ICD-10-CM

## 2014-10-25 LAB — POC HEMOCCULT BLD/STL (HOME/3-CARD/SCREEN)
Card #2 Fecal Occult Blod, POC: NEGATIVE
Card #3 Fecal Occult Blood, POC: NEGATIVE
Fecal Occult Blood, POC: NEGATIVE

## 2014-11-01 ENCOUNTER — Other Ambulatory Visit: Payer: Self-pay | Admitting: Physician Assistant

## 2014-11-02 ENCOUNTER — Other Ambulatory Visit: Payer: Self-pay | Admitting: *Deleted

## 2014-11-02 MED ORDER — OMEPRAZOLE 20 MG PO CPDR: 20.0000 mg | DELAYED_RELEASE_CAPSULE | Freq: Every day | ORAL | Status: DC

## 2014-11-02 MED ORDER — GLIPIZIDE 10 MG PO TABS: 10.0000 mg | ORAL_TABLET | Freq: Three times a day (TID) | ORAL | Status: DC

## 2014-11-02 MED ORDER — SIMVASTATIN 40 MG PO TABS: ORAL_TABLET | ORAL | Status: DC

## 2014-11-18 ENCOUNTER — Encounter: Payer: Self-pay | Admitting: *Deleted

## 2014-11-22 ENCOUNTER — Ambulatory Visit (INDEPENDENT_AMBULATORY_CARE_PROVIDER_SITE_OTHER): Payer: Commercial Managed Care - HMO | Admitting: Pharmacist Clinician (PhC)/ Clinical Pharmacy Specialist

## 2014-11-22 DIAGNOSIS — Z952 Presence of prosthetic heart valve: Secondary | ICD-10-CM

## 2014-11-22 DIAGNOSIS — Z7901 Long term (current) use of anticoagulants: Secondary | ICD-10-CM

## 2014-11-22 DIAGNOSIS — Z954 Presence of other heart-valve replacement: Secondary | ICD-10-CM

## 2014-11-22 LAB — POCT INR: INR: 2.5

## 2014-12-08 ENCOUNTER — Ambulatory Visit (INDEPENDENT_AMBULATORY_CARE_PROVIDER_SITE_OTHER): Payer: PPO | Admitting: Physician Assistant

## 2014-12-08 VITALS — BP 110/70 | HR 76 | Temp 97.9°F | Resp 16 | Ht 69.5 in | Wt 238.0 lb

## 2014-12-08 DIAGNOSIS — I10 Essential (primary) hypertension: Secondary | ICD-10-CM

## 2014-12-08 DIAGNOSIS — I25810 Atherosclerosis of coronary artery bypass graft(s) without angina pectoris: Secondary | ICD-10-CM

## 2014-12-08 DIAGNOSIS — I2583 Coronary atherosclerosis due to lipid rich plaque: Secondary | ICD-10-CM

## 2014-12-08 DIAGNOSIS — E559 Vitamin D deficiency, unspecified: Secondary | ICD-10-CM

## 2014-12-08 DIAGNOSIS — Z79899 Other long term (current) drug therapy: Secondary | ICD-10-CM

## 2014-12-08 DIAGNOSIS — I2510 Atherosclerotic heart disease of native coronary artery without angina pectoris: Secondary | ICD-10-CM | POA: Insufficient documentation

## 2014-12-08 DIAGNOSIS — E1121 Type 2 diabetes mellitus with diabetic nephropathy: Secondary | ICD-10-CM

## 2014-12-08 DIAGNOSIS — E785 Hyperlipidemia, unspecified: Secondary | ICD-10-CM

## 2014-12-08 LAB — HEPATIC FUNCTION PANEL
ALT: 21 U/L (ref 0–53)
AST: 18 U/L (ref 0–37)
Albumin: 4 g/dL (ref 3.5–5.2)
Alkaline Phosphatase: 70 U/L (ref 39–117)
Bilirubin, Direct: 0.2 mg/dL (ref 0.0–0.3)
Indirect Bilirubin: 0.9 mg/dL (ref 0.2–1.2)
Total Bilirubin: 1.1 mg/dL (ref 0.2–1.2)
Total Protein: 6.8 g/dL (ref 6.0–8.3)

## 2014-12-08 LAB — CBC WITH DIFFERENTIAL/PLATELET
Basophils Absolute: 0 10*3/uL (ref 0.0–0.1)
Basophils Relative: 0 % (ref 0–1)
Eosinophils Absolute: 0.1 10*3/uL (ref 0.0–0.7)
Eosinophils Relative: 1 % (ref 0–5)
HCT: 45.7 % (ref 39.0–52.0)
Hemoglobin: 15.7 g/dL (ref 13.0–17.0)
Lymphocytes Relative: 18 % (ref 12–46)
Lymphs Abs: 1.2 10*3/uL (ref 0.7–4.0)
MCH: 29.8 pg (ref 26.0–34.0)
MCHC: 34.4 g/dL (ref 30.0–36.0)
MCV: 86.9 fL (ref 78.0–100.0)
MPV: 11.5 fL (ref 8.6–12.4)
Monocytes Absolute: 0.4 10*3/uL (ref 0.1–1.0)
Monocytes Relative: 6 % (ref 3–12)
Neutro Abs: 5.2 10*3/uL (ref 1.7–7.7)
Neutrophils Relative %: 75 % (ref 43–77)
Platelets: 186 10*3/uL (ref 150–400)
RBC: 5.26 MIL/uL (ref 4.22–5.81)
RDW: 14.6 % (ref 11.5–15.5)
WBC: 6.9 10*3/uL (ref 4.0–10.5)

## 2014-12-08 LAB — LIPID PANEL
Cholesterol: 133 mg/dL (ref 0–200)
HDL: 32 mg/dL — ABNORMAL LOW
LDL Cholesterol: 75 mg/dL (ref 0–99)
Total CHOL/HDL Ratio: 4.2 ratio
Triglycerides: 131 mg/dL
VLDL: 26 mg/dL (ref 0–40)

## 2014-12-08 LAB — BASIC METABOLIC PANEL WITHOUT GFR
BUN: 11 mg/dL (ref 6–23)
CO2: 23 meq/L (ref 19–32)
Calcium: 9.3 mg/dL (ref 8.4–10.5)
Chloride: 101 meq/L (ref 96–112)
Creat: 0.96 mg/dL (ref 0.50–1.35)
GFR, Est African American: >89 mL/min
GFR, Est Non African American: 79 mL/min
Glucose, Bld: 153 mg/dL — ABNORMAL HIGH (ref 70–99)
Potassium: 4.1 meq/L (ref 3.5–5.3)
Sodium: 137 meq/L (ref 135–145)

## 2014-12-08 LAB — HEMOGLOBIN A1C
Hgb A1c MFr Bld: 7.2 % — ABNORMAL HIGH
Mean Plasma Glucose: 160 mg/dL — ABNORMAL HIGH

## 2014-12-08 LAB — TSH: TSH: 0.97 u[IU]/mL (ref 0.350–4.500)

## 2014-12-08 LAB — MAGNESIUM: Magnesium: 1.7 mg/dL (ref 1.5–2.5)

## 2014-12-08 NOTE — Progress Notes (Signed)
Assessment and Plan:  Hypertension: Continue medication, monitor blood pressure at home. Continue DASH diet.  Reminder to go to the ER if any CP, SOB, nausea, dizziness, severe HA, changes vision/speech, left arm numbness and tingling and jaw pain. Cholesterol: Continue diet and exercise. Check cholesterol.  Diabetes with diabetic chronic kidney disease-Continue diet and exercise. Check A1C Vitamin D Def- check level and continue medications.  Obesity with co morbidities- long discussion about weight loss, diet, and exercise S/p AVR- Continue cardio follow up CABG/CAD-Control blood pressure, cholesterol, glucose, increase exercise.  Continue diet and meds as discussed. Further disposition pending results of labs. Discussed med's effects and SE's.   HPI 74 y.o. male  presents for 3 month follow up with hypertension, hyperlipidemia, diabetes and vitamin D. His blood pressure has been controlled at home, today their BP is BP: 110/70 mmHg He does not workout. He denies chest pain, shortness of breath, dizziness.  He has history of AVR and follows with coumadin clinic once a month. He has a history of CAD s/p CABG, follows with cardio.  He is on cholesterol medication, simvastatin 40mg  and denies myalgias. His cholesterol is at goal. The cholesterol was:  09/07/2014: Cholesterol, Total 135; HDL Cholesterol by NMR 38*; LDL (calc) 77; Triglycerides 98 He has been working on diet and exercise for Diabetes with diabetic nephropathy and with other circulatory complications but last time kidney function was better with decrease in HCTZ, FGR 89, he is on bASA, he is on ACE/ARB,he is on cinnamon 500 BID, farxiga 10mg , metformin 500mg  x 4, glipizide 10mg  1 AM and 1/2 lunch and dinner, checking sugars 1-2 x a week in the AM, running 120's and denies paresthesia of the feet, polydipsia, polyuria and visual disturbances. Last A1C was: 09/07/2014: Hemoglobin-A1c 8.2* (9.4)  Patient is not on Vitamin D supplement, he  can not remember it. 09/07/2014: Vit D, 25-Hydroxy 34  BMI is Body mass index is 34.65 kg/(m^2)., he is working on diet and exercise. Wt Readings from Last 3 Encounters:  12/08/14 238 lb (107.956 kg)  09/07/14 239 lb 6.4 oz (108.591 kg)  05/30/14 239 lb (108.41 kg)    Current Medications:  Current Outpatient Prescriptions on File Prior to Visit  Medication Sig Dispense Refill  . aspirin 81 MG tablet Take 81 mg by mouth daily.      . clotrimazole-betamethasone (LOTRISONE) cream Apply 1 application topically 2 (two) times daily. 15 g 2  . cyanocobalamin (,VITAMIN B-12,) 1000 MCG/ML injection INJECT 1/2 ML EVERY MONTH 10 mL 1  . enalapril (VASOTEC) 20 MG tablet Take 2 tablets (40 mg total) by mouth daily. 180 tablet 3  . FARXIGA 10 MG TABS TAKE 1 TABLET BY MOUTH EVERY DAY FOR DIABETES 30 tablet PRN  . glipiZIDE (GLUCOTROL) 10 MG tablet Take 1 tablet (10 mg total) by mouth 3 (three) times daily before meals. 270 tablet 3  . hydrochlorothiazide (HYDRODIURIL) 25 MG tablet TAKE 1 TABLET  EVERY DAY 90 tablet 0  . metFORMIN (GLUCOPHAGE-XR) 500 MG 24 hr tablet TAKE 2 TABLETS TWICE DAILY WITH A MEAL 360 tablet 1  . metoprolol (LOPRESSOR) 50 MG tablet TAKE 1 TABLET DAILY. 90 tablet 1  . nystatin (MYCOSTATIN/NYSTOP) 100000 UNIT/GM POWD APPLY TO AFFECTED AREA TWICE A DAY 60 g 99  . omeprazole (PRILOSEC) 20 MG capsule Take 1 capsule (20 mg total) by mouth daily. 90 capsule 3  . simvastatin (ZOCOR) 40 MG tablet TAKE 1 TABLET  EVERY DAY 90 tablet 3  . warfarin (COUMADIN)  5 MG tablet Take 1 tablet by mouth daily or as directed 90 tablet 3   No current facility-administered medications on file prior to visit.   Medical History:  Past Medical History  Diagnosis Date  . Hypertension   . GERD (gastroesophageal reflux disease)   . Cancer     prostate  . Anemia   . Type II or unspecified type diabetes mellitus without mention of complication, not stated as uncontrolled   . Hyperlipidemia   . Umbilical  hernia   . AS (aortic stenosis)     valve replacement-st. jude mechanical valve  . CAD (coronary artery disease), native coronary artery     CABG 07/18/2003  . History of nuclear stress test 02/12/10    EF 67%, normal perfusion  . ASHD (arteriosclerotic heart disease)   . Vitamin D deficiency   . Vitamin B12 deficiency   . Morbid obesity    Allergies: No Known Allergies   Review of Systems:  Review of Systems  Constitutional: Negative.   HENT: Negative.   Eyes: Negative.   Respiratory: Negative.   Cardiovascular: Negative.   Gastrointestinal: Negative.   Genitourinary: Positive for frequency. Negative for dysuria, urgency, hematuria and flank pain.  Musculoskeletal: Negative.   Skin: Negative.   Neurological: Negative.   Endo/Heme/Allergies: Negative.   Psychiatric/Behavioral: Negative.     Family history- Review and unchanged Social history- Review and unchanged Physical Exam: BP 110/70 mmHg  Pulse 76  Temp(Src) 97.9 F (36.6 C)  Resp 16  Ht 5' 9.5" (1.765 m)  Wt 238 lb (107.956 kg)  BMI 34.65 kg/m2 Wt Readings from Last 3 Encounters:  12/08/14 238 lb (107.956 kg)  09/07/14 239 lb 6.4 oz (108.591 kg)  05/30/14 239 lb (108.41 kg)   General Appearance: Well nourished, in no apparent distress. Eyes: PERRLA, EOMs, conjunctiva no swelling or erythema Sinuses: No Frontal/maxillary tenderness ENT/Mouth: Ext aud canals clear, TMs without erythema, bulging. No erythema, swelling, or exudate on post pharynx.  Tonsils not swollen or erythematous. Hearing normal.  Neck: Supple, thyroid normal.  Respiratory: Respiratory effort normal, BS equal bilaterally without rales, rhonchi, wheezing or stridor.  Cardio: RRR with systolic click. Brisk peripheral pulses with 1+ edema.  Abdomen: Soft, + BS, obese  Non tender, no guarding, rebound, hernias, masses. Lymphatics: Non tender without lymphadenopathy.  Musculoskeletal: Full ROM, 5/5 strength, normal gait.  Skin: Warm, dry without  rashes, lesions, ecchymosis.  Neuro: Cranial nerves intact. No cerebellar symptoms. Sensation intact.  Psych: Awake and oriented X 3, normal affect, Insight and Judgment appropriate.    Vicie Mutters, PA-C 10:02 AM [facility]

## 2014-12-08 NOTE — Patient Instructions (Signed)
Recommendations For Diabetic/Prediabetic Patients:   -  Take medications as prescribed  -  Recommend Dr Fara Olden Fuhrman's book "The End of Diabetes "  And "The End of Dieting"- Can get at  [email or web address] and encourage also get the Audio CD book  - AVOID Animal products, ie. Meat - red/white, Poultry and Dairy/especially cheese - Exercise at least 5 times a week for 30 minutes or preferably daily.  - No Smoking - Drink less than 2 drinks a day.  - Monitor your feet for sores - Have yearly Eye Exams - Recommend annual Flu vaccine  - Recommend Pneumovax and Prevnar vaccines - Shingles Vaccine (Zostavax) if over 7 y.o.  Goals:   - BMI less than 24 - Fasting sugar less than 130 or less than 150 if tapering medicines to lose weight  - Systolic BP less than [id number]  - Diastolic BP less than 80 - Bad LDL Cholesterol less than 70 - Triglycerides less than 150  Ways to cut 100 calories  1. Eat your eggs with hot sauce OR salsa instead of cheese.  Eggs are great for breakfast, but many people consider eggs and cheese to be BFFs. Instead of cheese-1 oz. of cheddar has 114 calories-top your eggs with hot sauce, which contains no calories and helps with satiety and metabolism. Salsa is also a great option!!  2. Top your toast, waffles or pancakes with mashed berries instead of jelly or syrup. Half a cup of berries-fresh, frozen or thawed-has about 40 calories, compared with 2 tbsp. of maple syrup or jelly, which both have about 100 calories. The berries will also give you a good punch of fiber, which helps keep you full and satisfied and won't spike blood sugar quickly like the jelly or syrup. 3. Swap the non-fat latte for black coffee with a splash of half-and-half. Contrary to its name, that non-fat latte has 130 calories and a startling 19g of carbohydrates per 16 oz. serving. Replacing that 'light' drinkable dessert with a black coffee with a splash of half-and-half saves you more than 100 calories per  16 oz. serving. 4. Sprinkle salads with freeze-dried raspberries instead of dried cranberries. If you want a sweet addition to your nutritious salad, stay away from dried cranberries. They have a whopping 130 calories per  cup and 30g carbohydrates. Instead, sprinkle freeze-dried raspberries guilt-free and save more than 100 calories per  cup serving, adding 3g of belly-filling fiber. 5. Go for mustard in place of mayo on your sandwich. Mustard can add really nice flavor to any sandwich, and there are tons of varieties, from spicy to honey. A serving of mayo is 95 calories, versus 10 calories in a serving of mustard. 6. Choose a DIY salad dressing instead of the store-bought kind. Mix Dijon or whole grain mustard with low-fat Kefir or red wine vinegar and garlic. 7. Use hummus as a spread instead of a dip. Use hummus as a spread on a high-fiber cracker or tortilla with a sandwich and save on calories without sacrificing taste. 8. Pick just one salad "accessory." Salad isn't automatically a calorie winner. It's easy to over-accessorize with toppings. Instead of topping your salad with nuts, avocado and cranberries (all three will clock in at 313 calories), just pick one. The next day, choose a different accessory, which will also keep your salad interesting. You don't wear all your jewelry every day, right? 9. Ditch the white pasta in favor of spaghetti squash. One cup of cooked spaghetti squash has about 40  calories, compared with traditional spaghetti, which comes with more than 200. Spaghetti squash is also nutrient-dense. It's a good source of fiber and Vitamins A and C, and it can be eaten just like you would eat pasta-with a great tomato sauce and [place] meatballs or with pesto, tofu and spinach, for example. 10. Dress up your chili, soups and stews with non-fat [place] yogurt instead of sour cream. Just a 'dollop' of sour cream can set you back 115 calories and a whopping 12g of fat-seven of  which are of the artery-clogging variety. Added bonus: [place] yogurt is packed with muscle-building protein, calcium and B Vitamins. 11. Mash cauliflower instead of mashed potatoes. One cup of traditional mashed potatoes-in all their creamy goodness-has more than 200 calories, compared to mashed cauliflower, which you can typically eat for less than 100 calories per 1 cup serving. Cauliflower is a great source of the antioxidant indole-3-carbinol (I3C), which may help reduce the risk of some cancers, like breast cancer. 12. Ditch the ice cream sundae in favor of a [place] yogurt parfait. Instead of a cup of ice cream or fro-yo for dessert, try 1 cup of nonfat Greek yogurt topped with fresh berries and a sprinkle of cacao nibs. Both toppings are packed with antioxidants, which can help reduce cellular inflammation and oxidative damage. And the comparison is a no-brainer: One cup of ice cream has about 275 calories; one cup of frozen yogurt has about 230; and a cup of Greek yogurt has just 130, plus twice the protein, so you're less likely to return to the freezer for a second helping. 13. Put olive oil in a spray container instead of using it directly from the bottle. Each tablespoon of olive oil is 120 calories and 15g of fat. Use a mister instead of pouring it straight into the pan or onto a salad. This allows for portion control and will save you more than 100 calories. 14. When baking, substitute canned pumpkin for butter or oil. Canned pumpkin-not pumpkin pie mix-is loaded with Vitamin A, which is important for skin and eye health, as well as immunity. And the comparisons are pretty crazy:  cup of canned pumpkin has about 40 calories, compared to butter or oil, which has more than 800 calories. Yes, 800 calories. Applesauce and mashed banana can also serve as good substitutions for butter or oil, usually in a 1:1 ratio. 15. Top casseroles with high-fiber cereal instead of breadcrumbs. Breadcrumbs are  typically made with white bread, while breakfast cereals contain 5-9g of fiber per serving. Not only will you save more than 150 calories per  cup serving, the swap will also keep you more full and you'll get a metabolism boost from the added fiber. 16. Snack on pistachios instead of macadamia nuts. Believe it or not, you get the same amount of calories from 35 pistachios (100 calories) as you would from only five macadamia nuts. 17. Chow down on kale chips rather than potato chips. This is my favorite 'don't knock it 'till you try it' swap. Kale chips are so easy to make at home, and you can spice them up with a little grated parmesan or chili powder. Plus, they're a mere fraction of the calories of potato chips, but with the same crunch factor we crave so often. 18. Add seltzer and some fruit slices to your cocktail instead of soda or fruit juice. One cup of soda or fruit juice can pack on as much as 140 calories. Instead, use seltzer and fruit  slices. The fruit provides valuable phytochemicals, such as flavonoids and anthocyanins, which help to combat cancer and stave off the aging process.

## 2014-12-09 LAB — VITAMIN D 25 HYDROXY (VIT D DEFICIENCY, FRACTURES): Vit D, 25-Hydroxy: 24 ng/mL — ABNORMAL LOW (ref 30–100)

## 2014-12-20 ENCOUNTER — Ambulatory Visit (INDEPENDENT_AMBULATORY_CARE_PROVIDER_SITE_OTHER): Payer: PPO | Admitting: Pharmacist Clinician (PhC)/ Clinical Pharmacy Specialist

## 2014-12-20 DIAGNOSIS — Z952 Presence of prosthetic heart valve: Secondary | ICD-10-CM

## 2014-12-20 DIAGNOSIS — Z7901 Long term (current) use of anticoagulants: Secondary | ICD-10-CM

## 2014-12-20 DIAGNOSIS — Z954 Presence of other heart-valve replacement: Secondary | ICD-10-CM

## 2014-12-20 LAB — POCT INR: INR: 2.8

## 2015-01-23 ENCOUNTER — Encounter: Payer: Self-pay | Admitting: *Deleted

## 2015-01-24 ENCOUNTER — Ambulatory Visit (INDEPENDENT_AMBULATORY_CARE_PROVIDER_SITE_OTHER): Payer: PPO | Admitting: Pharmacist Clinician (PhC)/ Clinical Pharmacy Specialist

## 2015-01-24 ENCOUNTER — Encounter: Payer: Self-pay | Admitting: Internal Medicine

## 2015-01-24 ENCOUNTER — Ambulatory Visit (INDEPENDENT_AMBULATORY_CARE_PROVIDER_SITE_OTHER): Payer: PPO | Admitting: Internal Medicine

## 2015-01-24 VITALS — BP 128/76 | HR 69 | Ht 69.5 in | Wt 235.3 lb

## 2015-01-24 DIAGNOSIS — Z951 Presence of aortocoronary bypass graft: Secondary | ICD-10-CM

## 2015-01-24 DIAGNOSIS — E785 Hyperlipidemia, unspecified: Secondary | ICD-10-CM

## 2015-01-24 DIAGNOSIS — Z024 Encounter for examination for driving license: Secondary | ICD-10-CM

## 2015-01-24 DIAGNOSIS — Z029 Encounter for administrative examinations, unspecified: Secondary | ICD-10-CM

## 2015-01-24 DIAGNOSIS — E1121 Type 2 diabetes mellitus with diabetic nephropathy: Secondary | ICD-10-CM

## 2015-01-24 DIAGNOSIS — Z954 Presence of other heart-valve replacement: Secondary | ICD-10-CM

## 2015-01-24 DIAGNOSIS — Z7901 Long term (current) use of anticoagulants: Secondary | ICD-10-CM

## 2015-01-24 DIAGNOSIS — Z952 Presence of prosthetic heart valve: Secondary | ICD-10-CM

## 2015-01-24 DIAGNOSIS — I2510 Atherosclerotic heart disease of native coronary artery without angina pectoris: Secondary | ICD-10-CM

## 2015-01-24 LAB — POCT INR: INR: 3.1

## 2015-01-24 NOTE — Progress Notes (Signed)
OFFICE NOTE  Chief Complaint:  No complaints  Primary Care Physician: Alesia Richards, MD  HPI:  Billy Reilly is a 74 year old gentleman, previously followed by Dr. Rex Kras, with a history of coronary disease. In 2004, he had CABG and replacement of his aortic valve with a St. Jude prosthesis. He had 2-vessel bypass at the time with saphenous vein graft to the first diagonal and saphenous vein graft to the posterior descending by Dr. Prescott Gum, and replacement of the aortic valve with a 22 mm ATS AP valve. He has done well with valve replacement to this point without any recurrent angina, shortness of breath, palpitations, presyncope, or syncopal symptoms. INR checked today in the office is 2.6 and it has been stable on Coumadin without dose adjustments for at least a year. 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 Recently he was started on Farxiga, which is resulted in excellent weight loss as well as control of his blood sugars.  Overall he feels quite well.  I saw [name] back today in the office. He is here for DOT visit. When I last saw him he was doing well denies any chest pain or shortness of breath. That holds true today. His warfarin levels have been well controlled his INR was 3.1 today. He has not had stress testing in over 5 years and his bypass was in 2004. 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  PMHx:  Past Medical History  Diagnosis Date  . Hypertension   . GERD (gastroesophageal reflux disease)   . Cancer     prostate  . Anemia   . Type II or unspecified type diabetes mellitus without mention of complication, not stated as uncontrolled   . Hyperlipidemia   . Umbilical hernia   . AS (aortic stenosis)     valve replacement-st. jude mechanical valve  . CAD (coronary artery disease), native coronary artery     CABG 07/18/2003  . History of nuclear stress  test 02/12/10    EF 67%, normal perfusion  . ASHD (arteriosclerotic heart disease)   . Vitamin D deficiency   . Vitamin B12 deficiency   . Morbid obesity     Past Surgical History  Procedure Laterality Date  . Aortic valve replacement  07/18/2003    58mm ATS-AP valve  . Hernia repair  [id number]    RIH, umbilical  . Aortic valve replacement (avr)/coronary artery bypass grafting (cabg)  07/18/2003    AVR; bypass x 2-SVG to first diag, SVG to posterior descending  . Cardiac catheterization  07/13/2003    AS-aortic valve gradient 39mmhg, valve area 1.1; obstruction of ostium of posterior lateral branch with obstruction in the ostium of first diagnonal  . Tee without cardioversion  07/18/2003    no evidence of PE, Aortic valve heavily calcified,   . Transthoracic echocardiogram  02/21/2013    EF60-65%, Septal motion showed paradox. atrium moderately dilated  . Nm myocar perf wall motion  02/12/2010    protocol:Bruce, EF 67%, exercise cap 7 METS, low risk scan    FAMHx:  Family History  Problem Relation Age of Onset  . Stroke Mother   . Heart attack Father   . Heart disease Father   . Cancer Brother   . Cancer Sister     SOCHx:   reports that he has never smoked. He has never used smokeless tobacco. He reports  that he does not drink alcohol or use illicit drugs.  ALLERGIES:  No Known Allergies  ROS: A comprehensive review of systems was negative.  HOME MEDS: Current Outpatient Prescriptions  Medication Sig Dispense Refill  . aspirin 81 MG tablet Take 81 mg by mouth daily.      . clotrimazole-betamethasone (LOTRISONE) cream Apply 1 application topically 2 (two) times daily. 15 g 2  . cyanocobalamin (,VITAMIN B-12,) 1000 MCG/ML injection INJECT 1/2 ML EVERY MONTH 10 mL 1  . enalapril (VASOTEC) 20 MG tablet Take 2 tablets (40 mg total) by mouth daily. 180 tablet 3  . FARXIGA 10 MG TABS TAKE 1 TABLET BY MOUTH EVERY DAY FOR DIABETES 30 tablet PRN  . glipiZIDE (GLUCOTROL) 10  MG tablet Take 1 tablet (10 mg total) by mouth 3 (three) times daily before meals. 270 tablet 3  . hydrochlorothiazide (HYDRODIURIL) 25 MG tablet TAKE 1 TABLET  EVERY DAY 90 tablet 0  . metFORMIN (GLUCOPHAGE-XR) 500 MG 24 hr tablet TAKE 2 TABLETS TWICE DAILY WITH A MEAL 360 tablet 1  . metoprolol (LOPRESSOR) 50 MG tablet TAKE 1 TABLET DAILY. 90 tablet 1  . nystatin (MYCOSTATIN/NYSTOP) 100000 UNIT/GM POWD APPLY TO AFFECTED AREA TWICE A DAY 60 g 99  . omeprazole (PRILOSEC) 20 MG capsule Take 1 capsule (20 mg total) by mouth daily. 90 capsule 3  . simvastatin (ZOCOR) 40 MG tablet TAKE 1 TABLET  EVERY DAY 90 tablet 3  . warfarin (COUMADIN) 5 MG tablet Take 1 tablet by mouth daily or as directed 90 tablet 3   No current facility-administered medications for this visit.    LABS/IMAGING: No results found for this or any previous visit (from the past 48 hour(s)). No results found.  VITALS: BP 128/76 mmHg  Pulse 69  Ht 5' 9.5" (1.765 m)  Wt 235 lb 4.8 oz (106.731 kg)  BMI 34.26 kg/m2  EXAM: General appearance: alert and no distress Neck: no carotid bruit, no JVD and thyroid not enlarged, symmetric, no tenderness/mass/nodules Lungs: clear to auscultation bilaterally Heart: regular rate and rhythm, S1, S2 normal, no murmur, click, rub or gallop Abdomen: soft, non-tender; bowel sounds normal; no masses,  no organomegaly Extremities: extremities normal, atraumatic, no cyanosis or edema Pulses: 2+ and symmetric Skin: Skin color, texture, turgor normal. No rashes or lesions Neurologic: Grossly normal Psych: Mood, affect normal  EKG: Normal sinus rhythm at 61  ASSESSMENT: 1. CAD status post 2 vessel CABG (SVG to diagonal SVG to PDA) in 2004 2. Status post ATS mechanical aortic valve 3. Lifetime anticoagulation on warfarin 4. HTN 5. Dyslipidemia 6. DM2  PLAN: 1.   [name] is doing well without any new cardiac problems. INR is therapeutic on warfarin. Blood pressure is well-controlled.  His cholesterol was recently assessed and is at goal. He reports his diabetes is improved per his primary care provider on his new regimen of oral diabetic medications. He is due for repeat stress testing based on the time which is over 5 years since his last stress test and the fact that his bypass was in 2004. I would recommend a exercise nuclear stress test. Based on those findings we can further risk stratify him with regards to his DOT physical.  Plan to see him back in 6 months.  [name], MD, [facility] Attending Cardiologist [facility]  Jet Armbrust C 01/24/2015, 5:52 PM

## 2015-01-24 NOTE — Patient Instructions (Signed)
Your physician has requested that you have an exercise stress myoview. For further information please visit [email or web address]. Please follow instruction sheet, as given. >> please schedule in the morning   Your physician wants you to follow-up in: 6 months with Dr. Debara Pickett. You will receive a reminder letter in the mail two months in advance. If you don't receive a letter, please call our office to schedule the follow-up appointment.

## 2015-01-25 ENCOUNTER — Telehealth (HOSPITAL_COMMUNITY): Payer: Self-pay

## 2015-01-25 NOTE — Telephone Encounter (Signed)
Encounter complete. 

## 2015-01-30 ENCOUNTER — Ambulatory Visit (HOSPITAL_COMMUNITY)
Admission: RE | Admit: 2015-01-30 | Discharge: 2015-01-30 | Disposition: A | Discharge status: Home / Self Care | Payer: PPO | Source: Ambulatory Visit | Attending: Cardiology | Admitting: Cardiology

## 2015-01-30 DIAGNOSIS — I2510 Atherosclerotic heart disease of native coronary artery without angina pectoris: Secondary | ICD-10-CM | POA: Diagnosis not present

## 2015-01-30 DIAGNOSIS — Z951 Presence of aortocoronary bypass graft: Secondary | ICD-10-CM | POA: Diagnosis not present

## 2015-01-30 DIAGNOSIS — Z024 Encounter for examination for driving license: Secondary | ICD-10-CM

## 2015-01-30 DIAGNOSIS — Z029 Encounter for administrative examinations, unspecified: Secondary | ICD-10-CM | POA: Diagnosis present

## 2015-01-30 MED ORDER — TECHNETIUM TC 99M SESTAMIBI GENERIC - CARDIOLITE
10.1000 | Freq: Once | INTRAVENOUS | Status: AC | PRN
  Administered 2015-01-30: 10.1 via INTRAVENOUS

## 2015-01-30 MED ORDER — TECHNETIUM TC 99M SESTAMIBI GENERIC - CARDIOLITE
31.0000 | Freq: Once | INTRAVENOUS | Status: AC | PRN
  Administered 2015-01-30: 31 via INTRAVENOUS

## 2015-01-30 NOTE — Procedures (Addendum)
[facility] [address] [place] [address] [id number]  Cardiology Nuclear Med Crist Fat  Billy Reilly is a 74 y.o. male     MRN : [id number]     DOB: 1941-01-19  Procedure Date: 01/30/2015  Nuclear Med Background Indication for Stress Test:  Follow up CAD;DOT physical. History:  CAD;CABG X2 wAVR IN 2004;Last NUC MPI on 02/12/2010-normal;EF=67% Cardiac Risk Factors: Family History - CAD, Hypertension, Lipids and Obesity  Symptoms:  Pt denies all symptoms at this time.   Nuclear Pre-Procedure Caffeine/Decaff Intake:  7:00pm NPO After: 3:00am   IV Site: R Forearm  IV 0.9% NS with Angio Cath:  22g  Chest Size (in):  50" IV Started by: Rolene Course, RN  Height: 5\' 9"  (1.753 m)  Cup Size: n/a  BMI:  Body mass index is 34.69 kg/(m^2). Weight:  235 lb (106.595 kg)   Tech Comments:  n/a    Nuclear Med Study 1 or 2 day study: 1 day  Stress Test Type:  Stress  Order Authorizing Provider:  Lyman Bishop, MD   Resting Radionuclide: Technetium 48m Sestamibi  Resting Radionuclide Dose: 10.1 mCi   Stress Radionuclide:  Technetium 35m Sestamibi  Stress Radionuclide Dose: 31.0 mCi           Stress Protocol Rest HR: 60 Stress HR: 136  Rest BP: 129/72 Stress BP: 137/71  Exercise Time (min): 6:32 METS: 7.8   Predicted Max HR: 148 bpm % Max HR: 91.89 bpm Rate Pressure Product: 24480  Dose of Adenosine (mg):  n/a Dose of Lexiscan: n/a mg  Dose of Atropine (mg): n/a Dose of Dobutamine: n/a mcg/kg/min (at max HR)  Stress Test Technologist: Leane Para, CCT Nuclear Technologist: Imagene Riches, CNMT   Rest Procedure:  Myocardial perfusion imaging was performed at rest 45 minutes following the intravenous administration of Technetium 29m Sestamibi. Stress Procedure:  The patient performed treadmill exercise using a Bruce  Protocol for 6:32 minutes. The patient stopped due to SOB and Leg Fatigue and denied any chest pain.  There  were no significant ST-T wave changes.  Technetium 39m Sestamibi was injected IV at peak exercise and myocardial perfusion imaging was performed after a brief delay.  Transient Ischemic Dilatation (Normal <1.22):  1.30  QGS EDV:  95 ml QGS ESV:  42 ml LV Ejection Fraction: 56%  Rest ECG: NSR - Normal EKG  Stress ECG: There are scattered PVCs.  QPS Raw Data Images:  Normal; no motion artifact; normal heart/lung ratio. Stress Images:  Small mild lateral artifact Rest Images:  lateral artifact Subtraction (SDS):  No evidence of ischemia.  Impression Exercise Capacity:  Good exercise capacity. BP Response:  Normal blood pressure response. Clinical Symptoms:  There is dyspnea. ECG Impression:  No significant ST segment change suggestive of ischemia. Comparison with Prior Nuclear Study: No significant change from previous study  Overall Impression:  Low risk stress nuclear study with small amount of lateral artifact. No significant reversible ischemia.  LV Wall Motion:  NL LV Function; NL Wall Motion; EF 56%  [name], MD, [facility] Board Certified in Nuclear Cardiology Attending Cardiologist [place], MD  01/30/2015 12:19 PM

## 2015-02-01 ENCOUNTER — Telehealth: Payer: Self-pay | Admitting: *Deleted

## 2015-02-01 NOTE — Telephone Encounter (Signed)
Notified patient that DOT form has been completed and it along with stress test and echo will be left for him to pick up.

## 2015-02-23 ENCOUNTER — Ambulatory Visit (INDEPENDENT_AMBULATORY_CARE_PROVIDER_SITE_OTHER): Payer: PPO | Admitting: Pharmacist Clinician (PhC)/ Clinical Pharmacy Specialist

## 2015-02-23 DIAGNOSIS — Z954 Presence of other heart-valve replacement: Secondary | ICD-10-CM

## 2015-02-23 DIAGNOSIS — Z7901 Long term (current) use of anticoagulants: Secondary | ICD-10-CM | POA: Diagnosis not present

## 2015-02-23 DIAGNOSIS — Z952 Presence of prosthetic heart valve: Secondary | ICD-10-CM

## 2015-02-23 LAB — POCT INR: INR: 2.6

## 2015-03-09 ENCOUNTER — Encounter: Payer: Self-pay | Admitting: Internal Medicine

## 2015-03-09 ENCOUNTER — Ambulatory Visit (INDEPENDENT_AMBULATORY_CARE_PROVIDER_SITE_OTHER): Payer: PPO | Admitting: Internal Medicine

## 2015-03-09 VITALS — BP 132/78 | HR 64 | Temp 97.3°F | Resp 16 | Ht 69.5 in | Wt 235.0 lb

## 2015-03-09 DIAGNOSIS — E559 Vitamin D deficiency, unspecified: Secondary | ICD-10-CM

## 2015-03-09 DIAGNOSIS — Z952 Presence of prosthetic heart valve: Secondary | ICD-10-CM

## 2015-03-09 DIAGNOSIS — I10 Essential (primary) hypertension: Secondary | ICD-10-CM

## 2015-03-09 DIAGNOSIS — E1121 Type 2 diabetes mellitus with diabetic nephropathy: Secondary | ICD-10-CM

## 2015-03-09 DIAGNOSIS — Z79899 Other long term (current) drug therapy: Secondary | ICD-10-CM

## 2015-03-09 DIAGNOSIS — K219 Gastro-esophageal reflux disease without esophagitis: Secondary | ICD-10-CM

## 2015-03-09 DIAGNOSIS — E785 Hyperlipidemia, unspecified: Secondary | ICD-10-CM

## 2015-03-09 LAB — TSH: TSH: 1.25 u[IU]/mL (ref 0.350–4.500)

## 2015-03-09 LAB — CBC WITH DIFFERENTIAL/PLATELET
Basophils Absolute: 0 10*3/uL (ref 0.0–0.1)
Basophils Relative: 0 % (ref 0–1)
Eosinophils Absolute: 0.2 10*3/uL (ref 0.0–0.7)
Eosinophils Relative: 2 % (ref 0–5)
HCT: 44.9 % (ref 39.0–52.0)
Hemoglobin: 15.3 g/dL (ref 13.0–17.0)
Lymphocytes Relative: 15 % (ref 12–46)
Lymphs Abs: 1.2 10*3/uL (ref 0.7–4.0)
MCH: 28.9 pg (ref 26.0–34.0)
MCHC: 34.1 g/dL (ref 30.0–36.0)
MCV: 84.9 fL (ref 78.0–100.0)
MPV: 11.4 fL (ref 8.6–12.4)
Monocytes Absolute: 0.6 10*3/uL (ref 0.1–1.0)
Monocytes Relative: 7 % (ref 3–12)
Neutro Abs: 6.3 10*3/uL (ref 1.7–7.7)
Neutrophils Relative %: 76 % (ref 43–77)
Platelets: 183 10*3/uL (ref 150–400)
RBC: 5.29 MIL/uL (ref 4.22–5.81)
RDW: 14.8 % (ref 11.5–15.5)
WBC: 8.3 10*3/uL (ref 4.0–10.5)

## 2015-03-09 LAB — HEMOGLOBIN A1C
Hgb A1c MFr Bld: 7.7 % — ABNORMAL HIGH
Mean Plasma Glucose: 174 mg/dL — ABNORMAL HIGH

## 2015-03-09 LAB — HEPATIC FUNCTION PANEL
ALT: 17 U/L (ref 0–53)
AST: 16 U/L (ref 0–37)
Albumin: 4.2 g/dL (ref 3.5–5.2)
Alkaline Phosphatase: 76 U/L (ref 39–117)
Bilirubin, Direct: 0.2 mg/dL (ref 0.0–0.3)
Indirect Bilirubin: 1 mg/dL (ref 0.2–1.2)
Total Bilirubin: 1.2 mg/dL (ref 0.2–1.2)
Total Protein: 6.7 g/dL (ref 6.0–8.3)

## 2015-03-09 LAB — BASIC METABOLIC PANEL WITHOUT GFR
BUN: 14 mg/dL (ref 6–23)
CO2: 24 meq/L (ref 19–32)
Calcium: 9.2 mg/dL (ref 8.4–10.5)
Chloride: 99 meq/L (ref 96–112)
Creat: 0.85 mg/dL (ref 0.50–1.35)
GFR, Est African American: >89 mL/min
GFR, Est Non African American: 87 mL/min
Glucose, Bld: 127 mg/dL — ABNORMAL HIGH (ref 70–99)
Potassium: 4.5 meq/L (ref 3.5–5.3)
Sodium: 138 meq/L (ref 135–145)

## 2015-03-09 LAB — LIPID PANEL
Cholesterol: 146 mg/dL (ref 0–200)
HDL: 33 mg/dL — ABNORMAL LOW
LDL Cholesterol: 86 mg/dL (ref 0–99)
Total CHOL/HDL Ratio: 4.4 ratio
Triglycerides: 133 mg/dL
VLDL: 27 mg/dL (ref 0–40)

## 2015-03-09 LAB — MAGNESIUM: Magnesium: 1.8 mg/dL (ref 1.5–2.5)

## 2015-03-09 MED ORDER — RANITIDINE HCL 300 MG PO TABS: ORAL_TABLET | ORAL | Status: DC

## 2015-03-09 MED ORDER — DAPAGLIFLOZIN PROPANEDIOL 10 MG PO TABS: 10.0000 mg | ORAL_TABLET | Freq: Every day | ORAL | Status: DC

## 2015-03-09 MED ORDER — OMEPRAZOLE 20 MG PO CPDR: 20.0000 mg | DELAYED_RELEASE_CAPSULE | Freq: Every day | ORAL | Status: DC

## 2015-03-09 NOTE — Patient Instructions (Addendum)
GETTING OFF OF PPI's    Nexium/protonix/prilosec/Omeprazole/Dexilant/Aciphex are called PPI's, they are great at healing your stomach but should only be taken for a short period of time.     Recent studies have shown that taken for a long time they  can increase the risk of osteoporosis (weakening of your bones), pneumonia, low magnesium, restless legs, Cdiff (infection that causes diarrhea), DEMENTIA and most recently kidney damage / disease / insufficiency.     Due to this information we want to try to stop the PPI but if you try to stop it abruptly this can cause rebound acid and worsening symptoms.   So this is how we want you to get off the PPI:  - Start taking the nexium/protonix/prilosec/PPI  every other day with  zantac (ranitidine) 2 x a day for 2-4 weeks  - then decrease the PPI to every 3 days while taking the zantac (ranitidine) twice a day the other  days for 2-4  Weeks  - then you can try the zantac (ranitidine) once at night or up to 2 x day as needed.  - you can continue on this once at night or stop all together  - Avoid alcohol, spicy foods, NSAIDS (aleve, ibuprofen) at this time. See foods below.   +++++++++++++++++++++++++++++++++++++++++++  Food Choices for Gastroesophageal Reflux Disease  When you have gastroesophageal reflux disease (GERD), the foods you eat and your eating habits are very important. Choosing the right foods can help ease the discomfort of GERD. WHAT GENERAL GUIDELINES DO I NEED TO FOLLOW?  Choose fruits, vegetables, whole grains, low-fat dairy products, and low-fat meat, fish, and poultry.  Limit fats such as oils, salad dressings, butter, nuts, and avocado.  Keep a food diary to identify foods that cause symptoms.  Avoid foods that cause reflux. These may be different for different people.  Eat frequent small meals instead of three large meals each day.  Eat your meals slowly, in a relaxed setting.  Limit fried foods.  Cook foods  using methods other than frying.  Avoid drinking alcohol.  Avoid drinking large amounts of liquids with your meals.  Avoid bending over or lying down until 2-3 hours after eating.   WHAT FOODS ARE NOT RECOMMENDED? The following are some foods and drinks that may worsen your symptoms:  Vegetables Tomatoes. Tomato juice. Tomato and spaghetti sauce. Chili peppers. Onion and garlic. Horseradish. Fruits Oranges, grapefruit, and lemon (fruit and juice). Meats High-fat meats, fish, and poultry. This includes hot dogs, ribs, ham, sausage, salami, and bacon. Dairy Whole milk and chocolate milk. Sour cream. Cream. Butter. Ice cream. Cream cheese.  Beverages Coffee and tea, with or without caffeine. Carbonated beverages or energy drinks. Condiments Hot sauce. Barbecue sauce.  Sweets/Desserts Chocolate and cocoa. Donuts. Peppermint and spearmint. Fats and Oils High-fat foods, including [place] fries and potato chips. Other Vinegar. Strong spices, such as black pepper, white pepper, red pepper, cayenne, curry powder, cloves, ginger, and chili powder. Nexium/protonix/prilosec are called PPI's, they are great at healing your stomach but should only be taken for a short period of time.     ++++++++++++++++++++++++++++++++++  Recommend the book "The END of DIETING" by Dr Excell Seltzer   & the book "The END of DIABETES " by Dr Excell Seltzer  At [email or web address] - get book & Audio CD's      Being diabetic has a  300% increased risk for heart attack, stroke, cancer, and alzheimer- type vascular dementia. It is very important that you work harder  with diet by avoiding all foods that are white. Avoid white rice (brown & wild rice is OK), white potatoes (sweetpotatoes in moderation is OK), White bread or wheat bread or anything made out of white flour like bagels, donuts, rolls, buns, biscuits, cakes, pastries, cookies, pizza crust, and pasta (made from white flour & egg whites) - vegetarian pasta or  spinach or wheat pasta is OK. Multigrain breads like Arnold's or Pepperidge Farm, or multigrain sandwich thins or flatbreads.  Diet, exercise and weight loss can reverse and cure diabetes in the early stages.  Diet, exercise and weight loss is very important in the control and prevention of complications of diabetes which affects every system in your body, ie. Brain - dementia/stroke, eyes - glaucoma/blindness, heart - heart attack/heart failure, kidneys - dialysis, stomach - gastric paralysis, intestines - malabsorption, nerves - severe painful neuritis, circulation - gangrene & loss of a leg(s), and finally cancer and Alzheimers.    I recommend avoid fried & greasy foods,  sweets/candy, white rice (brown or wild rice or Quinoa is OK), white potatoes (sweet potatoes are OK) - anything made from white flour - bagels, doughnuts, rolls, buns, biscuits,white and wheat breads, pizza crust and traditional pasta made of white flour & egg white(vegetarian pasta or spinach or wheat pasta is OK).  Multi-grain bread is OK - like multi-grain flat bread or sandwich thins. Avoid alcohol in excess. Exercise is also important.    Eat all the vegetables you want - avoid meat, especially red meat and dairy - especially cheese.  Cheese is the most concentrated form of trans-fats which is the worst thing to clog up our arteries. Veggie cheese is OK which can be found in the fresh produce section at [facility] or [employer] or [employer]

## 2015-03-09 NOTE — Progress Notes (Addendum)
Patient ID: Billy Reilly, male   DOB: [birth year], 74 y.o.   MRN: [id number]   This very nice 74 y.o. 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    Patient is treated for HTN & BP has been controlled at home. Today's BP: 132/78 mmHg. Patient has had no complaints of any cardiac type chest pain, palpitations, dyspnea/orthopnea/PND, dizziness, claudication, or dependent edema.   Hyperlipidemia is controlled with diet & meds. Patient denies myalgias or other med SE's. Last Lipids were at goal - Total Chol 133; HDL 32; LDL 75; Trig 131 on 12/08/2014.   Also, the patient has history of T2_NIDDM and has had no symptoms of reactive hypoglycemia, diabetic polys, paresthesias or visual blurring.  Last A1c was  7.2% on 12/08/2014.    Patient a;so has GERD & is on maintenance therapy with control of Sx's. Further, the patient also has history of Vitamin D Deficiency and sporadically supplements vitamin D. Last vitamin D was extremely low at  24 on  12/08/2014.  Medication Sig  . aspirin 81 MG tablet Take 81 mg by mouth daily.    . clotrimazole-betamethasone cream Apply 1 application topically 2 (two) times daily.  Marland Kitchen VITAMIN B-12 1000 MCG/ML injection INJECT 1/2 ML EVERY MONTH  . enalapril  20 MG tablet Take 2 tablets (40 mg total) by mouth daily.  Marland Kitchen FARXIGA 10 MG TABS TAKE 1 TABLET BY MOUTH EVERY DAY FOR DIABETES  . glipiZIDE 10 MG tablet Take 1 tablet (10 mg total) by mouth 3 (three) times daily before meals.  . hydrochlorothiazide  25 MG tablet TAKE 1 TABLET  EVERY DAY  . metFORMIN -XR) 500 MG 24 hr tablet TAKE 2 TABLETS TWICE DAILY WITH A MEAL  . metoprolol (LOPRESSOR) 50 MG tablet TAKE 1 TABLET DAILY.  Marland Kitchen nystatin (MYCOSTATIN/NYSTOP) 100000 UNIT/GM POWD APPLY TO AFFECTED AREA TWICE A DAY  . omeprazole (PRILOSEC) 20 MG capsule Take 1 capsule (20 mg total) by mouth daily.  . simvastatin (ZOCOR) 40 MG tablet TAKE 1 TABLET  EVERY DAY  .  warfarin (COUMADIN) 5 MG tablet Take 1 tablet by mouth daily or as directed   No Known Allergies  PMHx:   Past Medical History  Diagnosis Date  . Hypertension   . GERD (gastroesophageal reflux disease)   . Cancer     prostate  . Anemia   . Type II or unspecified type diabetes mellitus without mention of complication, not stated as uncontrolled   . Hyperlipidemia   . Umbilical hernia   . AS (aortic stenosis)     valve replacement-st. jude mechanical valve  . CAD (coronary artery disease), native coronary artery     CABG 07/18/2003  . History of nuclear stress test 02/12/10    EF 67%, normal perfusion  . ASHD (arteriosclerotic heart disease)   . Vitamin D deficiency   . Vitamin B12 deficiency   . Morbid obesity    Immunization History  Administered Date(s) Administered  . Influenza Whole 09/01/2012  . Pneumococcal Polysaccharide-23 09/01/2012  . Tdap 11/25/2007   Past Surgical History  Procedure Laterality Date  . Aortic valve replacement  07/18/2003    27mm ATS-AP valve  . Hernia repair  [id number]    RIH, umbilical  . Aortic valve replacement (avr)/coronary artery bypass grafting (cabg)  07/18/2003    AVR; bypass x 2-SVG to first diag, SVG to posterior descending  . Cardiac catheterization  07/13/2003  AS-aortic valve gradient 26mmhg, valve area 1.1; obstruction of ostium of posterior lateral branch with obstruction in the ostium of first diagnonal  . Tee without cardioversion  07/18/2003    no evidence of PE, Aortic valve heavily calcified,   . Transthoracic echocardiogram  02/21/2013    EF60-65%, Septal motion showed paradox. atrium moderately dilated  . Nm myocar perf wall motion  02/12/2010    protocol:Bruce, EF 67%, exercise cap 7 METS, low risk scan   FHx:    Reviewed / unchanged  SHx:    Reviewed / unchanged  Systems Review:  Constitutional: Denies fever, chills, wt changes, headaches, insomnia, fatigue, night sweats, change in appetite. Eyes: Denies  redness, blurred vision, diplopia, discharge, itchy, watery eyes.  ENT: Denies discharge, congestion, post nasal drip, epistaxis, sore throat, earache, hearing loss, dental pain, tinnitus, vertigo, sinus pain, snoring.  CV: Denies chest pain, palpitations, irregular heartbeat, syncope, dyspnea, diaphoresis, orthopnea, PND, claudication or edema. Respiratory: denies cough, dyspnea, DOE, pleurisy, hoarseness, laryngitis, wheezing.  Gastrointestinal: Denies dysphagia, odynophagia, heartburn, reflux, water brash, abdominal pain or cramps, nausea, vomiting, bloating, diarrhea, constipation, hematemesis, melena, hematochezia  or hemorrhoids. Genitourinary: Denies dysuria, frequency, urgency, nocturia, hesitancy, discharge, hematuria or flank pain. Musculoskeletal: Denies arthralgias, myalgias, stiffness, jt. swelling, pain, limping or strain/sprain.  Skin: Denies pruritus, rash, hives, warts, acne, eczema or change in skin lesion(s). Neuro: No weakness, tremor, incoordination, spasms, paresthesia or pain. Psychiatric: Denies confusion, memory loss or sensory loss. Endo: Denies change in weight, skin or hair change.  Heme/Lymph: No excessive bleeding, bruising or enlarged lymph nodes.  Physical Exam  BP 132/78   Pulse 64  Temp 97.3 F   Resp 16  Ht 5' 9.5"   Wt 235 lb     BMI 34.22   Appears well nourished and in no distress. Eyes: PERRLA, EOMs, conjunctiva no swelling or erythema. Sinuses: No frontal/maxillary tenderness ENT/Mouth: EAC's clear, TM's nl w/o erythema, bulging. Nares clear w/o erythema, swelling, exudates. Oropharynx clear without erythema or exudates. Oral hygiene is good. Tongue normal, non obstructing. Hearing intact.  Neck: Supple. Thyroid nl. Car 2+/2+ without bruits, nodes or JVD. Chest: Respirations nl with BS clear & equal w/o rales, rhonchi, wheezing or stridor.  Cor: Heart sounds normal w/ regular rate and rhythm without sig. murmurs, gallops, clicks, or rubs.  Peripheral pulses normal and equal  without edema.  Abdomen: Soft & bowel sounds normal. Non-tender w/o guarding, rebound, hernias, masses, or organomegaly.  Lymphatics: Unremarkable.  Musculoskeletal: Full ROM all peripheral extremities, joint stability, 5/5 strength, and normal gait.  Skin: Warm, dry without exposed rashes, lesions or ecchymosis apparent.  Neuro: Cranial nerves intact, reflexes equal bilaterally. Sensory-motor testing grossly intact. Tendon reflexes grossly intact.  Pysch: Alert & oriented x 3.  Insight and judgement nl & appropriate. No ideations.  Assessment and Plan:  1. Essential hypertension  - TSH  2. 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 Vitamin D deficiency  - Vit D  25 hydroxy (rtn osteoporosis monitoring)  5. Morbid obesity   6. ASCAD s/p CABG &  AoVR  - On Warfarin - [facility]  7. Medication management  - CBC with Differential/Platelet - BASIC METABOLIC PANEL WITH GFR - Hepatic function panel - Magnesium  8. GERD -   - Patient advised transition from Omeprazole to Ranitidine.   Recommended regular exercise, BP monitoring, weight control, and discussed med and SE's. Recommended labs  to assess and monitor clinical status. Further disposition pending results of labs. Over 30 minutes of exam, counseling, chart review was performed

## 2015-03-10 ENCOUNTER — Encounter: Payer: Self-pay | Admitting: Internal Medicine

## 2015-03-10 LAB — INSULIN, RANDOM: Insulin: 8.9 u[IU]/mL (ref 2.0–19.6)

## 2015-03-10 LAB — VITAMIN D 25 HYDROXY (VIT D DEFICIENCY, FRACTURES): Vit D, 25-Hydroxy: 42 ng/mL (ref 30–100)

## 2015-03-21 ENCOUNTER — Ambulatory Visit (INDEPENDENT_AMBULATORY_CARE_PROVIDER_SITE_OTHER): Payer: PPO | Admitting: Pharmacist Clinician (PhC)/ Clinical Pharmacy Specialist

## 2015-03-21 DIAGNOSIS — Z952 Presence of prosthetic heart valve: Secondary | ICD-10-CM

## 2015-03-21 DIAGNOSIS — Z7901 Long term (current) use of anticoagulants: Secondary | ICD-10-CM

## 2015-03-21 DIAGNOSIS — Z954 Presence of other heart-valve replacement: Secondary | ICD-10-CM

## 2015-03-21 LAB — POCT INR: INR: 3.4

## 2015-03-27 ENCOUNTER — Other Ambulatory Visit: Payer: Self-pay | Admitting: Physician Assistant

## 2015-04-13 ENCOUNTER — Other Ambulatory Visit: Payer: Self-pay

## 2015-04-13 MED ORDER — SIMVASTATIN 40 MG PO TABS: ORAL_TABLET | ORAL | Status: DC

## 2015-04-13 MED ORDER — METOPROLOL TARTRATE 50 MG PO TABS: 50.0000 mg | ORAL_TABLET | Freq: Every day | ORAL | Status: DC

## 2015-04-18 ENCOUNTER — Ambulatory Visit (INDEPENDENT_AMBULATORY_CARE_PROVIDER_SITE_OTHER): Payer: PPO | Admitting: Pharmacist Clinician (PhC)/ Clinical Pharmacy Specialist

## 2015-04-18 DIAGNOSIS — Z952 Presence of prosthetic heart valve: Secondary | ICD-10-CM

## 2015-04-18 DIAGNOSIS — Z7901 Long term (current) use of anticoagulants: Secondary | ICD-10-CM

## 2015-04-18 DIAGNOSIS — Z954 Presence of other heart-valve replacement: Secondary | ICD-10-CM | POA: Diagnosis not present

## 2015-04-18 LAB — POCT INR: INR: 2.9

## 2015-05-23 ENCOUNTER — Ambulatory Visit (INDEPENDENT_AMBULATORY_CARE_PROVIDER_SITE_OTHER): Payer: PPO | Admitting: Pharmacist Clinician (PhC)/ Clinical Pharmacy Specialist

## 2015-05-23 DIAGNOSIS — Z7901 Long term (current) use of anticoagulants: Secondary | ICD-10-CM | POA: Diagnosis not present

## 2015-05-23 DIAGNOSIS — Z954 Presence of other heart-valve replacement: Secondary | ICD-10-CM | POA: Diagnosis not present

## 2015-05-23 DIAGNOSIS — Z952 Presence of prosthetic heart valve: Secondary | ICD-10-CM

## 2015-05-23 LAB — POCT INR: INR: 2.8

## 2015-06-06 ENCOUNTER — Encounter: Payer: Self-pay | Admitting: Physician Assistant

## 2015-06-06 DIAGNOSIS — Z0000 Encounter for general adult medical examination without abnormal findings: Secondary | ICD-10-CM | POA: Insufficient documentation

## 2015-06-08 ENCOUNTER — Ambulatory Visit (INDEPENDENT_AMBULATORY_CARE_PROVIDER_SITE_OTHER): Payer: PPO | Admitting: Physician Assistant

## 2015-06-08 ENCOUNTER — Encounter: Payer: Self-pay | Admitting: Physician Assistant

## 2015-06-08 VITALS — BP 120/78 | HR 68 | Temp 97.9°F | Resp 16 | Ht 69.5 in | Wt 231.0 lb

## 2015-06-08 DIAGNOSIS — Z1331 Encounter for screening for depression: Secondary | ICD-10-CM

## 2015-06-08 DIAGNOSIS — E538 Deficiency of other specified B group vitamins: Secondary | ICD-10-CM

## 2015-06-08 DIAGNOSIS — K429 Umbilical hernia without obstruction or gangrene: Secondary | ICD-10-CM

## 2015-06-08 DIAGNOSIS — E1121 Type 2 diabetes mellitus with diabetic nephropathy: Secondary | ICD-10-CM

## 2015-06-08 DIAGNOSIS — Z0000 Encounter for general adult medical examination without abnormal findings: Secondary | ICD-10-CM

## 2015-06-08 DIAGNOSIS — I2510 Atherosclerotic heart disease of native coronary artery without angina pectoris: Secondary | ICD-10-CM

## 2015-06-08 DIAGNOSIS — Z951 Presence of aortocoronary bypass graft: Secondary | ICD-10-CM

## 2015-06-08 DIAGNOSIS — Z0001 Encounter for general adult medical examination with abnormal findings: Secondary | ICD-10-CM

## 2015-06-08 DIAGNOSIS — E785 Hyperlipidemia, unspecified: Secondary | ICD-10-CM

## 2015-06-08 DIAGNOSIS — Z79899 Other long term (current) drug therapy: Secondary | ICD-10-CM

## 2015-06-08 DIAGNOSIS — Z6833 Body mass index (BMI) 33.0-33.9, adult: Secondary | ICD-10-CM

## 2015-06-08 DIAGNOSIS — Z9181 History of falling: Secondary | ICD-10-CM

## 2015-06-08 DIAGNOSIS — Z952 Presence of prosthetic heart valve: Secondary | ICD-10-CM

## 2015-06-08 DIAGNOSIS — R6889 Other general symptoms and signs: Secondary | ICD-10-CM

## 2015-06-08 DIAGNOSIS — E559 Vitamin D deficiency, unspecified: Secondary | ICD-10-CM

## 2015-06-08 DIAGNOSIS — I10 Essential (primary) hypertension: Secondary | ICD-10-CM

## 2015-06-08 LAB — CBC WITH DIFFERENTIAL/PLATELET
Basophils Absolute: 0 10*3/uL (ref 0.0–0.1)
Basophils Relative: 0 % (ref 0–1)
Eosinophils Absolute: 0.1 10*3/uL (ref 0.0–0.7)
Eosinophils Relative: 2 % (ref 0–5)
HCT: 39.2 % (ref 39.0–52.0)
Hemoglobin: 12.4 g/dL — ABNORMAL LOW (ref 13.0–17.0)
Lymphocytes Relative: 16 % (ref 12–46)
Lymphs Abs: 1 10*3/uL (ref 0.7–4.0)
MCH: 26.6 pg (ref 26.0–34.0)
MCHC: 31.6 g/dL (ref 30.0–36.0)
MCV: 83.9 fL (ref 78.0–100.0)
MPV: 10.2 fL (ref 8.6–12.4)
Monocytes Absolute: 0.4 10*3/uL (ref 0.1–1.0)
Monocytes Relative: 7 % (ref 3–12)
Neutro Abs: 4.8 10*3/uL (ref 1.7–7.7)
Neutrophils Relative %: 75 % (ref 43–77)
Platelets: 242 10*3/uL (ref 150–400)
RBC: 4.67 MIL/uL (ref 4.22–5.81)
RDW: 16.1 % — ABNORMAL HIGH (ref 11.5–15.5)
WBC: 6.4 10*3/uL (ref 4.0–10.5)

## 2015-06-08 LAB — HEPATIC FUNCTION PANEL
ALT: 15 U/L (ref 0–53)
AST: 16 U/L (ref 0–37)
Albumin: 4.1 g/dL (ref 3.5–5.2)
Alkaline Phosphatase: 58 U/L (ref 39–117)
Bilirubin, Direct: 0.2 mg/dL (ref 0.0–0.3)
Indirect Bilirubin: 0.8 mg/dL (ref 0.2–1.2)
Total Bilirubin: 1 mg/dL (ref 0.2–1.2)
Total Protein: 6.5 g/dL (ref 6.0–8.3)

## 2015-06-08 LAB — BASIC METABOLIC PANEL WITHOUT GFR
BUN: 16 mg/dL (ref 6–23)
CO2: 22 meq/L (ref 19–32)
Calcium: 9.3 mg/dL (ref 8.4–10.5)
Chloride: 103 meq/L (ref 96–112)
Creat: 0.98 mg/dL (ref 0.50–1.35)
GFR, Est African American: 89 mL/min
GFR, Est Non African American: 77 mL/min
Glucose, Bld: 171 mg/dL — ABNORMAL HIGH (ref 70–99)
Potassium: 4.3 meq/L (ref 3.5–5.3)
Sodium: 139 meq/L (ref 135–145)

## 2015-06-08 LAB — LIPID PANEL
Cholesterol: 127 mg/dL (ref 0–200)
HDL: 33 mg/dL — ABNORMAL LOW
LDL Cholesterol: 66 mg/dL (ref 0–99)
Total CHOL/HDL Ratio: 3.8 ratio
Triglycerides: 140 mg/dL
VLDL: 28 mg/dL (ref 0–40)

## 2015-06-08 LAB — HEMOGLOBIN A1C
Hgb A1c MFr Bld: 7.1 % — ABNORMAL HIGH
Mean Plasma Glucose: 157 mg/dL — ABNORMAL HIGH

## 2015-06-08 LAB — MAGNESIUM: Magnesium: 1.8 mg/dL (ref 1.5–2.5)

## 2015-06-08 NOTE — Progress Notes (Signed)
MEDICARE ANNUAL WELLNESS VISIT AND FOLLOW UP Assessment:   1. Essential hypertension - continue medications, DASH diet, exercise and monitor at home. Call if greater than 130/80.  - CBC with Differential/Platelet - BASIC METABOLIC PANEL WITH GFR - Hepatic function panel - TSH  2. Morbid obesity Obesity with co morbidities- long discussion about weight loss, diet, and exercise  3. Hyperlipidemia -continue medications, check lipids, decrease fatty foods, increase activity.  - Lipid panel  4. Type II diabetes mellitus with nephropathy Discussed general issues about diabetes pathophysiology and management., Educational material distributed., Suggested low cholesterol diet., Encouraged aerobic exercise., Discussed foot care., Reminded to get yearly retinal exam. - Hemoglobin A1c - HM DIABETES FOOT EXAM  5. Coronary artery disease involving native coronary artery of native heart without angina pectoris Control blood pressure, cholesterol, glucose, increase exercise.   6. Hx of CABG Control blood pressure, cholesterol, glucose, increase exercise.  Continue cardio follow up  7. S/P AVR Continue cardio follow up Continue coumadin  8. Vitamin D deficiency - Vit D  25 hydroxy (rtn osteoporosis monitoring)  9. Vitamin B12 deficiency Continue shots  10. Medication management - Magnesium  11. Umbilical hernia, recurrence not specified Weight loss advised, no pain  12. Encounter for Medicare annual wellness exam Discussed Prevnar, declined, given info   Plan:   During the course of the visit the patient was educated and counseled about appropriate screening and preventive services including:    Pneumococcal vaccine   Influenza vaccine  Td vaccine  Screening electrocardiogram  Colorectal cancer screening  Diabetes screening  Glaucoma screening  Nutrition counseling   Conditions/risks identified: BMI: Discussed weight loss, diet, and increase physical activity.   Increase physical activity: AHA recommends 150 minutes of physical activity a week.  Medications reviewed Diabetes is not at goal, ACE/ARB therapy: Yes. Urinary Incontinence is not an issue: discussed non pharmacology and pharmacology options.  Fall risk: low- discussed PT, home fall assessment, medications.    Subjective:  Billy Reilly is a 74 y.o. male who presents for Medicare Annual Wellness Visit and 3 month follow up for HTN, hyperlipidemia, diabetes, and vitamin D Def.  Date of last medicare wellness visit was 06/07/2014.  His blood pressure has been controlled at home, today their BP is BP: 120/78 mmHg He does not workout. He denies chest pain, shortness of breath, dizziness.  He has a history of CAD with history of CABG and with AVR, follows with Dr. Debara Pickett, on coumadin chronically.  Lab Results  Component Value Date   INR 2.8 05/23/2015   INR 2.9 04/18/2015   INR 3.4 03/21/2015   He is on cholesterol medication, simvastatin and denies myalgias. His cholesterol is at goal. The cholesterol last visit was:   Lab Results  Component Value Date   CHOL 146 03/09/2015   HDL 33* 03/09/2015   LDLCALC 86 03/09/2015   TRIG 133 03/09/2015   CHOLHDL 4.4 03/09/2015   He has been working on diet and exercise for diabetes with CKD stage III, ED, CAD, his sugars have been around 140 for the past 3 months, he is on bASA and is on ACE, he has been taking 1/2 of the glipizide 3 times a day, farxiga and denies polydipsia and polyuria. Last A1C in the office was:  Lab Results  Component Value Date   HGBA1C 7.7* 03/09/2015  Patient is on Vitamin D supplement but admits to forgetting it.   Lab Results  Component Value Date   VD25OH 42 03/09/2015  BMI is Body mass index is 33.64 kg/(m^2)., he is working on diet and exercise. Wt Readings from Last 3 Encounters:  06/08/15 231 lb (104.781 kg)  03/09/15 235 lb (106.595 kg)  01/30/15 235 lb (106.595 kg)   Medication Review: Current  Outpatient Prescriptions on File Prior to Visit  Medication Sig Dispense Refill  . aspirin 81 MG tablet Take 81 mg by mouth daily.      . clotrimazole-betamethasone (LOTRISONE) cream Apply 1 application topically 2 (two) times daily. 15 g 2  . cyanocobalamin (,VITAMIN B-12,) 1000 MCG/ML injection INJECT 1/2 ML EVERY MONTH 10 mL 1  . enalapril (VASOTEC) 20 MG tablet Take 2 tablets (40 mg total) by mouth daily. 180 tablet 3  . FARXIGA 10 MG TABS tablet TAKE 1 TABLET BY MOUTH EVERY DAY FOR DIABETES 30 tablet 3  . glipiZIDE (GLUCOTROL) 10 MG tablet Take 1 tablet (10 mg total) by mouth 3 (three) times daily before meals. 270 tablet 3  . hydrochlorothiazide (HYDRODIURIL) 25 MG tablet TAKE 1 TABLET  EVERY DAY 90 tablet 0  . metFORMIN (GLUCOPHAGE-XR) 500 MG 24 hr tablet TAKE 2 TABLETS TWICE DAILY WITH A MEAL 360 tablet 1  . metoprolol (LOPRESSOR) 50 MG tablet Take 1 tablet (50 mg total) by mouth daily. 90 tablet 3  . nystatin (MYCOSTATIN/NYSTOP) 100000 UNIT/GM POWD APPLY TO AFFECTED AREA TWICE A DAY 60 g 99  . ranitidine (ZANTAC) 300 MG tablet Take 1 to 2 tablets daily for heartburn to allow wean off of Prilosec/Omeprazole 180 tablet 99  . simvastatin (ZOCOR) 40 MG tablet TAKE 1 TABLET  EVERY DAY 90 tablet 3  . warfarin (COUMADIN) 5 MG tablet Take 1 tablet by mouth daily or as directed 90 tablet 3   No current facility-administered medications on file prior to visit.    Current Problems (verified) Patient Active Problem List   Diagnosis Date Noted  . Encounter for Medicare annual wellness exam 06/06/2015  . Encounter for Department of Transportation (DOT) examination for driving license renewal [id number]  . Hx of CABG 01/24/2015  . CAD in native artery 01/24/2015  . CAD (coronary artery disease) 12/08/2014  . Medication management 02/28/2014  . Essential hypertension 12/13/2013  . Hyperlipidemia   . Type II diabetes mellitus with nephropathy   . Vitamin D deficiency   . Vitamin B12  deficiency   . Morbid obesity   . Long term current use of anticoagulant therapy 02/08/2013  . S/P AVR 02/08/2013  . Umbilical hernia without mention of obstruction or gangrene 09/02/2011    Screening Tests Health Maintenance  Topic Date Due  . ZOSTAVAX  09/02/2002  . PNA vac Low Risk Adult (2 of 2 - PCV13) 09/01/2013  . INFLUENZA VACCINE  06/25/2015  . FOOT EXAM  09/08/2015  . HEMOGLOBIN A1C  09/08/2015  . URINE MICROALBUMIN  09/08/2015  . OPHTHALMOLOGY EXAM  10/25/2015  . TETANUS/TDAP  11/24/2017  . COLONOSCOPY  10/14/2020    Immunization History  Administered Date(s) Administered  . Influenza Whole 09/01/2012  . Pneumococcal Polysaccharide-23 09/01/2012  . Tdap 11/25/2007   Preventative care: Last colonoscopy: 2011 Virtual colonoscopy Stress test 01/2015, normal Echo 2014 CXR 2012 Cath 2004 s/p CABG  Prior vaccinations: TD or Tdap: 2009 Influenza: 2014 Pneumococcal: 2013 Shingles/Zostavax: declines Prevnar 13: discussed, will think about it, info given    Names of Other Physician/Practitioners you currently use: 1. [facility] here for primary care 2. Dr. Delman Cheadle, last visit last year, 10/2014, wears glasses  3. Dr. Anne Hahn, dentist, last visit q 6 months Patient Care Team: Unk Pinto, MD as PCP - General (Internal Medicine) [name], MD as Consulting Physician (Cardiology) Irine Seal, MD as Attending Physician (Urology) Laurence Spates, MD as Consulting Physician (Gastroenterology)  Allergies No Known Allergies Surgical history Past Surgical History  Procedure Laterality Date  . Aortic valve replacement  07/18/2003    11mm ATS-AP valve  . Hernia repair  [id number]    RIH, umbilical  . Aortic valve replacement (avr)/coronary artery bypass grafting (cabg)  07/18/2003    AVR; bypass x 2-SVG to first diag, SVG to posterior descending  . Cardiac catheterization  07/13/2003    AS-aortic valve gradient 54mmhg,  valve area 1.1; obstruction of ostium of posterior lateral branch with obstruction in the ostium of first diagnonal  . Tee without cardioversion  07/18/2003    no evidence of PE, Aortic valve heavily calcified,   . Transthoracic echocardiogram  02/21/2013    EF60-65%, Septal motion showed paradox. atrium moderately dilated  . Nm myocar perf wall motion  02/12/2010    protocol:Bruce, EF 67%, exercise cap 7 METS, low risk scan   Family history Family History  Problem Relation Age of Onset  . Stroke Mother   . Heart attack Father   . Heart disease Father   . Cancer Brother   . Cancer Sister    Risk Factors: Tobacco History  Substance Use Topics  . Smoking status: Never Smoker   . Smokeless tobacco: Never Used  . Alcohol Use: No   MEDICARE WELLNESS OBJECTIVES: Tobacco use: He does not smoke.  Patient is not a former smoker. If yes, counseling given Alcohol Current alcohol use: none Osteoporosis: dietary calcium and/or vitamin D deficiency, History of fracture in the past year: no Fall risk: Low Risk Hearing: normal Visual acuity: normal,  does not perform annual eye exam Diet: in general, an "unhealthy" diet Physical activity: Current Exercise Habits:: The patient does not participate in regular exercise at present Cardiac risk factors: Cardiac Risk Factors include: advanced age (>57men, >65 women);diabetes mellitus;dyslipidemia;family history of premature cardiovascular disease;hypertension;male gender;obesity (BMI >30kg/m2);sedentary lifestyle;Other (see comment), Risk factor comments: Personal history of CAD s/p CABG Depression/mood screen:   Depression screen [facility] 2/9 06/08/2015  Decreased Interest 0  Down, Depressed, Hopeless 0  PHQ - 2 Score 0    ADLs:  In your present state of health, do you have any difficulty performing the following activities: 06/08/2015  Hearing? N  Vision? N  Difficulty concentrating or making decisions? N  Walking or climbing stairs? N  Dressing  or bathing? N  Doing errands, shopping? N  Preparing Food and eating ? N  Using the Toilet? N  In the past six months, have you accidently leaked urine? N  Do you have problems with loss of bowel control? N  Managing your Medications? N  Managing your Finances? N  Housekeeping or managing your Housekeeping? N    Cognitive Testing  Alert? Yes  Normal Appearance?Yes  Oriented to person? Yes  Place? Yes   Time? Yes  Recall of three objects?  Yes  Can perform simple calculations? Yes  Displays appropriate judgment?Yes  Can read the correct time from a watch face?Yes  EOL planning: Does patient have an advance directive?: Yes Type of Advance Directive: Healthcare Power of Attorney, Living will Does patient want to make changes to advanced directive?: No - Patient declined Copy of advanced directive(s) in chart?: No - copy requested   Objective:  Blood pressure 120/78, pulse 68, temperature 97.9 F (36.6 C), resp. rate 16, height 5' 9.5" (1.765 m), weight 231 lb (104.781 kg). Body mass index is 33.64 kg/(m^2).  General appearance: alert, no distress, WD/WN, male HEENT: normocephalic, sclerae anicteric, TMs pearly, nares patent, no discharge or erythema, pharynx normal Oral cavity: MMM, no lesions Neck: supple, no lymphadenopathy, no thyromegaly, no masses Heart: RRR, normal S1, S2, with PVCs with artifical click best heart LSB no murmurs Lungs: CTA bilaterally, no wheezes, rhonchi, or rales Abdomen: +bs, soft, obese non tender, non distended, no masses, no hepatomegaly, no splenomegaly Musculoskeletal: nontender, no swelling, no obvious deformity Extremities: no edema, no cyanosis, no clubbing Pulses: 2+ symmetric, upper and lower extremities, normal cap refill Neurological: alert, oriented x 3, CN2-12 intact, strength normal upper extremities and lower extremities, sensation normal throughout, DTRs 2+ throughout, no cerebellar signs, gait normal Psychiatric: normal affect,  behavior normal, pleasant   Medicare Attestation I have personally reviewed: The patient's medical and social history Their use of alcohol, tobacco or illicit drugs Their current medications and supplements The patient's functional ability including ADLs,fall risks, home safety risks, cognitive, and hearing and visual impairment Diet and physical activities Evidence for depression or mood disorders  The patient's weight, height, BMI, and visual acuity have been recorded in the chart.  I have made referrals, counseling, and provided education to the patient based on review of the above and I have provided the patient with a written personalized care plan for preventive services.     Vicie Mutters, PA-C   06/08/2015

## 2015-06-08 NOTE — Patient Instructions (Signed)
Pneumococcal Vaccine, Polyvalent suspension for injection What is this medicine? PNEUMOCOCCAL VACCINE, POLYVALENT (NEU mo KOK al vak SEEN, pol ee VEY luhnt) is a vaccine to prevent pneumococcus bacteria infection. These bacteria are a major cause of ear infections, 'Strep throat' infections, and serious pneumonia, meningitis, or blood infections worldwide. These vaccines help the body to produce antibodies (protective substances) that help your body defend against these bacteria. This vaccine is recommended for infants and young children. This vaccine will not treat an infection. This medicine may be used for other purposes; ask your health care provider or pharmacist if you have questions. COMMON BRAND NAME(S): Prevnar 13 What should I tell my health care provider before I take this medicine? They need to know if you have any of these conditions: -bleeding problems -fever -immune system problems -low platelet count in the blood -seizures -an unusual or allergic reaction to pneumococcal vaccine, diphtheria toxoid, other vaccines, latex, other medicines, foods, dyes, or preservatives -pregnant or trying to get pregnant -breast-feeding How should I use this medicine? This vaccine is for injection into a muscle. It is given by a health care professional. A copy of Vaccine Information Statements will be given before each vaccination. Read this sheet carefully each time. The sheet may change frequently. Talk to your pediatrician regarding the use of this medicine in children. While this drug may be prescribed for children as young as 38 weeks old for selected conditions, precautions do apply. Overdosage: If you think you have taken too much of this medicine contact a poison control center or emergency room at once. NOTE: This medicine is only for you. Do not share this medicine with others. What if I miss a dose? It is important not to miss your dose. Call your doctor or health care professional if  you are unable to keep an appointment. What may interact with this medicine? -medicines for cancer chemotherapy -medicines that suppress your immune function -medicines that treat or prevent blood clots like warfarin, enoxaparin, and dalteparin -steroid medicines like prednisone or cortisone This list may not describe all possible interactions. Give your health care provider a list of all the medicines, herbs, non-prescription drugs, or dietary supplements you use. Also tell them if you smoke, drink alcohol, or use illegal drugs. Some items may interact with your medicine. What should I watch for while using this medicine? Mild fever and pain should go away in 3 days or less. Report any unusual symptoms to your doctor or health care professional. What side effects may I notice from receiving this medicine? Side effects that you should report to your doctor or health care professional as soon as possible: -allergic reactions like skin rash, itching or hives, swelling of the face, lips, or tongue -breathing problems -confused -fever over 102 degrees F -pain, tingling, numbness in the hands or feet -seizures -unusual bleeding or bruising -unusual muscle weakness Side effects that usually do not require medical attention (report to your doctor or health care professional if they continue or are bothersome): -aches and pains -diarrhea -fever of 102 degrees F or less -headache -irritable -loss of appetite -pain, tender at site where injected -trouble sleeping This list may not describe all possible side effects. Call your doctor for medical advice about side effects. You may report side effects to FDA at 1-800-FDA-1088. Where should I keep my medicine? This does not apply. This vaccine is given in a clinic, pharmacy, doctor's office, or other health care setting and will not be stored at home. NOTE:  This sheet is a summary. It may not cover all possible information. If you have questions  about this medicine, talk to your doctor, pharmacist, or health care provider.  2015, Elsevier/Gold Standard. (2009-01-23 10:17:22)  Preventive Care for Adults A healthy lifestyle and preventive care can promote health and wellness. Preventive health guidelines for men include the following key practices:  A routine yearly physical is a good way to check with your health care provider about your health and preventative screening. It is a chance to share any concerns and updates on your health and to receive a thorough exam.  Visit your dentist for a routine exam and preventative care every 6 months. Brush your teeth twice a day and floss once a day. Good oral hygiene prevents tooth decay and gum disease.  The frequency of eye exams is based on your age, health, family medical history, use of contact lenses, and other factors. Follow your health care provider's recommendations for frequency of eye exams.  Eat a healthy diet. Foods such as vegetables, fruits, whole grains, low-fat dairy products, and lean protein foods contain the nutrients you need without too many calories. Decrease your intake of foods high in solid fats, added sugars, and salt. Eat the right amount of calories for you.Get information about a proper diet from your health care provider, if necessary.  Regular physical exercise is one of the most important things you can do for your health. Most adults should get at least 150 minutes of moderate-intensity exercise (any activity that increases your heart rate and causes you to sweat) each week. In addition, most adults need muscle-strengthening exercises on 2 or more days a week.  Maintain a healthy weight. The body mass index (BMI) is a screening tool to identify possible weight problems. It provides an estimate of body fat based on height and weight. Your health care provider can find your BMI and can help you achieve or maintain a healthy weight.For adults 20 years and  older:  A BMI below 18.5 is considered underweight.  A BMI of 18.5 to 24.9 is normal.  A BMI of 25 to 29.9 is considered overweight.  A BMI of 30 and above is considered obese.  Maintain normal blood lipids and cholesterol levels by exercising and minimizing your intake of saturated fat. Eat a balanced diet with plenty of fruit and vegetables. Blood tests for lipids and cholesterol should begin at age 53 and be repeated every 5 years. If your lipid or cholesterol levels are high, you are over 50, or you are at high risk for heart disease, you may need your cholesterol levels checked more frequently.Ongoing high lipid and cholesterol levels should be treated with medicines if diet and exercise are not working.  If you smoke, find out from your health care provider how to quit. If you do not use tobacco, do not start.  Lung cancer screening is recommended for adults aged 53-80 years who are at high risk for developing lung cancer because of a history of smoking. A yearly low-dose CT scan of the lungs is recommended for people who have at least a 30-pack-year history of smoking and are a current smoker or have quit within the past 15 years. A pack year of smoking is smoking an average of 1 pack of cigarettes a day for 1 year (for example: 1 pack a day for 30 years or 2 packs a day for 15 years). Yearly screening should continue until the smoker has stopped smoking for at  least 15 years. Yearly screening should be stopped for people who develop a health problem that would prevent them from having lung cancer treatment.  If you choose to drink alcohol, do not have more than 2 drinks per day. One drink is considered to be 12 ounces (355 mL) of beer, 5 ounces (148 mL) of wine, or 1.5 ounces (44 mL) of liquor.  Avoid use of street drugs. Do not share needles with anyone. Ask for help if you need support or instructions about stopping the use of drugs.  High blood pressure causes heart disease and  increases the risk of stroke. Your blood pressure should be checked at least every 1-2 years. Ongoing high blood pressure should be treated with medicines, if weight loss and exercise are not effective.  If you are 71-41 years old, ask your health care provider if you should take aspirin to prevent heart disease.  Diabetes screening involves taking a blood sample to check your fasting blood sugar level. Testing should be considered at a younger age or be carried out more frequently if you are overweight and have at least 1 risk factor for diabetes.  Colorectal cancer can be detected and often prevented. Most routine colorectal cancer screening begins at the age of 69 and continues through age 96. However, your health care provider may recommend screening at an earlier age if you have risk factors for colon cancer. On a yearly basis, your health care provider may provide home test kits to check for hidden blood in the stool. Use of a small camera at the end of a tube to directly examine the colon (sigmoidoscopy or colonoscopy) can detect the earliest forms of colorectal cancer. Talk to your health care provider about this at age 37, when routine screening begins. Direct exam of the colon should be repeated every 5-10 years through age 68, unless early forms of precancerous polyps or small growths are found.  Hepatitis C blood testing is recommended for all people born from [birth year] through 1964 and any individual with known risks for hepatitis C.  New guidelines recommend a once time screening for HIV.   Screening for abdominal aortic aneurysm (AAA)  by ultrasound is recommended for people who have history of high blood pressure or who are current or former smokers.  Healthy men should  receive prostate-specific antigen (PSA) blood tests as part of routine cancer screening. Talk with your health care provider about prostate cancer screening.  Testicular cancer screening is  recommended for adult males.  Screening includes self-exam, a health care provider exam, and other screening tests. Consult with your health care provider about any symptoms you have or any concerns you have about testicular cancer.  Use sunscreen. Apply sunscreen liberally and repeatedly throughout the day. You should seek shade when your shadow is shorter than you. Protect yourself by wearing long sleeves, pants, a wide-brimmed hat, and sunglasses year round, whenever you are outdoors.  Once a month, do a whole-body skin exam, using a mirror to look at the skin on your back. Tell your health care provider about new moles, moles that have irregular borders, moles that are larger than a pencil eraser, or moles that have changed in shape or color.  Stay current with required vaccines (immunizations).  Influenza vaccine. All adults should be immunized every year.  Tetanus, diphtheria, and acellular pertussis (Td, Tdap) vaccine. An adult who has not previously received Tdap or who does not know his vaccine status should receive 1 dose of Tdap.  This initial dose should be followed by tetanus and diphtheria toxoids (Td) booster doses every 10 years. Adults with an unknown or incomplete history of completing a 3-dose immunization series with Td-containing vaccines should begin or complete a primary immunization series including a Tdap dose. Adults should receive a Td booster every 10 years.  Zoster vaccine. One dose is recommended for adults aged 22 years or older unless certain conditions are present.    PREVNAR - Pneumococcal 13-valent conjugate (PCV13) vaccine. When indicated, a person who is uncertain of his immunization history and has no record of immunization should receive the PCV13 vaccine. An adult aged 58 years or older who has certain medical conditions and has not been previously immunized should receive 1 dose of PCV13 vaccine. This PCV13 should be followed with a dose of pneumococcal polysaccharide (PPSV23) vaccine. The  PPSV23 vaccine dose should be obtained at least 8 weeks after the dose of PCV13 vaccine. An adult aged 74 years or older who has certain medical conditions and previously received 1 or more doses of PPSV23 vaccine should receive 1 dose of PCV13. The PCV13 vaccine dose should be obtained 1 or more years after the last PPSV23 vaccine dose.    PNEUMOVAX - Pneumococcal polysaccharide (PPSV23) vaccine. When PCV13 is also indicated, PCV13 should be obtained first. All adults aged 82 years and older should be immunized. An adult younger than age 17 years who has certain medical conditions should be immunized. Any person who resides in a nursing home or long-term care facility should be immunized. An adult smoker should be immunized. People with an immunocompromised condition and certain other conditions should receive both PCV13 and PPSV23 vaccines. People with human immunodeficiency virus (HIV) infection should be immunized as soon as possible after diagnosis. Immunization during chemotherapy or radiation therapy should be avoided. Routine use of PPSV23 vaccine is not recommended for American Indians, [place] Natives, or people younger than 65 years unless there are medical conditions that require PPSV23 vaccine. When indicated, people who have unknown immunization and have no record of immunization should receive PPSV23 vaccine. One-time revaccination 5 years after the first dose of PPSV23 is recommended for people aged 19-64 years who have chronic kidney failure, nephrotic syndrome, asplenia, or immunocompromised conditions. People who received 1-2 doses of PPSV23 before age 19 years should receive another dose of PPSV23 vaccine at age 49 years or later if at least 5 years have passed since the previous dose. Doses of PPSV23 are not needed for people immunized with PPSV23 at or after age 80 years.    Hepatitis A vaccine. Adults who wish to be protected from this disease, have certain high-risk conditions, work  with hepatitis A-infected animals, work in hepatitis A research labs, or travel to or work in countries with a high rate of hepatitis A should be immunized. Adults who were previously unvaccinated and who anticipate close contact with an international adoptee during the first 60 days after arrival in the [place] from a country with a high rate of hepatitis A should be immunized.    Hepatitis B vaccine. Adults should be immunized if they wish to be protected from this disease, have certain high-risk conditions, may be exposed to blood or other infectious body fluids, are household contacts or sex partners of hepatitis B positive people, are clients or workers in certain care facilities, or travel to or work in countries with a high rate of hepatitis B.   Preventive Service / Frequency   Ages 37 and  over  Blood pressure check.  Lipid and cholesterol check.  Lung cancer screening. / Every year if you are aged 12-80 years and have a 30-pack-year history of smoking and currently smoke or have quit within the past 15 years. Yearly screening is stopped once you have quit smoking for at least 15 years or develop a health problem that would prevent you from having lung cancer treatment.  Fecal occult blood test (FOBT) of stool. You may not have to do this test if you get a colonoscopy every 10 years.  Flexible sigmoidoscopy** or colonoscopy.** / Every 5 years for a flexible sigmoidoscopy or every 10 years for a colonoscopy beginning at age 1 and continuing until age 5.  Hepatitis C blood test.** / For all people born from [birth year] through 1964 and any individual with known risks for hepatitis C.  Abdominal aortic aneurysm (AAA) screening./ Screening current or former smokers or have Hypertension.  Skin self-exam. / Monthly.  Influenza vaccine. / Every year.  Tetanus, diphtheria, and acellular pertussis (Tdap/Td) vaccine.** / 1 dose of Td every 10 years.   Zoster vaccine.** / 1 dose for  adults aged 59 years or older.         Pneumococcal 13-valent conjugate (PCV13) vaccine.    Pneumococcal polysaccharide (PPSV23) vaccine.     Hepatitis A vaccine.** / Consult your health care provider.  Hepatitis B vaccine.** / Consult your health care provider. Screening for abdominal aortic aneurysm (AAA)  by ultrasound is recommended for people who have history of high blood pressure or who are current or former smokers.

## 2015-06-09 LAB — TSH: TSH: 1.387 u[IU]/mL (ref 0.350–4.500)

## 2015-06-09 LAB — VITAMIN D 25 HYDROXY (VIT D DEFICIENCY, FRACTURES): Vit D, 25-Hydroxy: 33 ng/mL (ref 30–100)

## 2015-06-20 ENCOUNTER — Ambulatory Visit (INDEPENDENT_AMBULATORY_CARE_PROVIDER_SITE_OTHER): Payer: PPO | Admitting: Pharmacist Clinician (PhC)/ Clinical Pharmacy Specialist

## 2015-06-20 DIAGNOSIS — Z7901 Long term (current) use of anticoagulants: Secondary | ICD-10-CM

## 2015-06-20 DIAGNOSIS — Z954 Presence of other heart-valve replacement: Secondary | ICD-10-CM | POA: Diagnosis not present

## 2015-06-20 DIAGNOSIS — Z952 Presence of prosthetic heart valve: Secondary | ICD-10-CM

## 2015-06-20 LAB — POCT INR: INR: 2.7

## 2015-07-25 ENCOUNTER — Ambulatory Visit (INDEPENDENT_AMBULATORY_CARE_PROVIDER_SITE_OTHER): Payer: PPO | Admitting: Pharmacist Clinician (PhC)/ Clinical Pharmacy Specialist

## 2015-07-25 DIAGNOSIS — Z7901 Long term (current) use of anticoagulants: Secondary | ICD-10-CM

## 2015-07-25 DIAGNOSIS — Z954 Presence of other heart-valve replacement: Secondary | ICD-10-CM

## 2015-07-25 DIAGNOSIS — Z952 Presence of prosthetic heart valve: Secondary | ICD-10-CM

## 2015-07-25 LAB — POCT INR: INR: 2.8

## 2015-07-25 MED ORDER — ENALAPRIL MALEATE 20 MG PO TABS: 40.0000 mg | ORAL_TABLET | Freq: Every day | ORAL | Status: DC

## 2015-07-25 MED ORDER — WARFARIN SODIUM 5 MG PO TABS: ORAL_TABLET | ORAL | Status: DC

## 2015-08-13 ENCOUNTER — Other Ambulatory Visit: Payer: Self-pay | Admitting: Internal Medicine

## 2015-08-14 ENCOUNTER — Other Ambulatory Visit: Payer: Self-pay | Admitting: *Deleted

## 2015-08-14 MED ORDER — METFORMIN HCL ER 500 MG PO TB24: ORAL_TABLET | ORAL | Status: DC

## 2015-08-22 ENCOUNTER — Ambulatory Visit (INDEPENDENT_AMBULATORY_CARE_PROVIDER_SITE_OTHER): Payer: PPO | Admitting: Internal Medicine

## 2015-08-22 ENCOUNTER — Ambulatory Visit (INDEPENDENT_AMBULATORY_CARE_PROVIDER_SITE_OTHER): Payer: PPO | Admitting: Pharmacist Clinician (PhC)/ Clinical Pharmacy Specialist

## 2015-08-22 ENCOUNTER — Encounter: Payer: Self-pay | Admitting: Internal Medicine

## 2015-08-22 VITALS — BP 130/66 | HR 66 | Ht 69.0 in | Wt 232.3 lb

## 2015-08-22 DIAGNOSIS — I10 Essential (primary) hypertension: Secondary | ICD-10-CM

## 2015-08-22 DIAGNOSIS — Z952 Presence of prosthetic heart valve: Secondary | ICD-10-CM

## 2015-08-22 DIAGNOSIS — E785 Hyperlipidemia, unspecified: Secondary | ICD-10-CM

## 2015-08-22 DIAGNOSIS — Z954 Presence of other heart-valve replacement: Secondary | ICD-10-CM

## 2015-08-22 DIAGNOSIS — Z951 Presence of aortocoronary bypass graft: Secondary | ICD-10-CM | POA: Diagnosis not present

## 2015-08-22 DIAGNOSIS — Z7901 Long term (current) use of anticoagulants: Secondary | ICD-10-CM

## 2015-08-22 LAB — POCT INR: INR: 3.5

## 2015-08-22 NOTE — Patient Instructions (Signed)
Your physician wants you to follow-up in April 2017 with Dr. Debara Pickett. You will receive a reminder letter in the mail two months in advance. If you don't receive a letter, please call our office to schedule the follow-up appointment.

## 2015-08-22 NOTE — Progress Notes (Signed)
OFFICE NOTE  Chief Complaint:  No complaints  Primary Care Physician: Alesia Richards, MD  HPI:  Billy Reilly is a 74 year old gentleman, previously followed by Dr. Rex Kras, with a history of coronary disease. In 2004, he had CABG and replacement of his aortic valve with a mechanical prosthesis. He had 2-vessel bypass at the time with saphenous vein graft to the first diagonal and saphenous vein graft to the posterior descending by Dr. Prescott Gum, and replacement of the aortic valve with a 22 mm ATS AP valve. He has done well with valve replacement to this point without any recurrent angina, shortness of breath, palpitations, presyncope, or syncopal symptoms. INR checked today in the office is 2.6 and it has been stable on Coumadin without dose adjustments for at least a year. 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 Recently he was started on Farxiga, which is resulted in excellent weight loss as well as control of his blood sugars.  Overall he feels quite well.  I saw Billy Reilly back today in the office. He is here for DOT visit. When I last saw him he was doing well denies any chest pain or shortness of breath. That holds true today. His warfarin levels have been well controlled his INR was 3.1 today. He has not had stress testing in over 5 years and his bypass was in 2004. 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  Billy Reilly returns today for follow-up. I last saw him 6 months ago for DOT physical. At that time we performed a nuclear stress test which was negative for ischemia. 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 He denies any chest pain or worsening shortness of breath. Blood pressures been well controlled.  PMHx:  Past Medical History  Diagnosis Date  . Hypertension   . GERD (gastroesophageal reflux disease)   . Cancer     prostate    . Anemia   . Type II or unspecified type diabetes mellitus without mention of complication, not stated as uncontrolled   . Hyperlipidemia   . Umbilical hernia   . AS (aortic stenosis)     valve replacement-st. jude mechanical valve  . CAD (coronary artery disease), native coronary artery     CABG 07/18/2003  . History of nuclear stress test 02/12/10    EF 67%, normal perfusion  . ASHD (arteriosclerotic heart disease)   . Vitamin D deficiency   . Vitamin B12 deficiency   . Morbid obesity     Past Surgical History  Procedure Laterality Date  . Aortic valve replacement  07/18/2003    75mm ATS-AP valve  . Hernia repair  [id number]    RIH, umbilical  . Aortic valve replacement (avr)/coronary artery bypass grafting (cabg)  07/18/2003    AVR; bypass x 2-SVG to first diag, SVG to posterior descending  . Cardiac catheterization  07/13/2003    AS-aortic valve gradient 35mmhg, valve area 1.1; obstruction of ostium of posterior lateral branch with obstruction in the ostium of first diagnonal  . Tee without cardioversion  07/18/2003    no evidence of PE, Aortic valve heavily calcified,   . Transthoracic echocardiogram  02/21/2013    EF60-65%, Septal motion showed paradox. atrium moderately dilated  . Nm myocar perf wall motion  02/12/2010    protocol:Bruce, EF 67%, exercise cap 7 METS, low risk scan  FAMHx:  Family History  Problem Relation Age of Onset  . Stroke Mother   . Heart attack Father   . Heart disease Father   . Cancer Brother   . Cancer Sister     SOCHx:   reports that he has never smoked. He has never used smokeless tobacco. He reports that he does not drink alcohol or use illicit drugs.  ALLERGIES:  No Known Allergies  ROS: A comprehensive review of systems was negative.  HOME MEDS: Current Outpatient Prescriptions  Medication Sig Dispense Refill  . aspirin 81 MG tablet Take 81 mg by mouth daily.      . clotrimazole-betamethasone (LOTRISONE) cream Apply 1  application topically 2 (two) times daily. 15 g 2  . cyanocobalamin (,VITAMIN B-12,) 1000 MCG/ML injection INJECT 1/2 ML EVERY MONTH 10 mL 1  . enalapril (VASOTEC) 20 MG tablet Take 2 tablets (40 mg total) by mouth daily. 180 tablet 3  . FARXIGA 10 MG TABS tablet TAKE 1 TABLET BY MOUTH EVERY DAY FOR DIABETES 30 tablet 3  . glipiZIDE (GLUCOTROL) 10 MG tablet Take 1 tablet (10 mg total) by mouth 3 (three) times daily before meals. 270 tablet 3  . hydrochlorothiazide (HYDRODIURIL) 25 MG tablet TAKE 1 TABLET  EVERY DAY 90 tablet 0  . metFORMIN (GLUCOPHAGE-XR) 500 MG 24 hr tablet TAKE 2 TABLETS TWICE DAILY WITH A MEAL 360 tablet 1  . metoprolol (LOPRESSOR) 50 MG tablet Take 1 tablet (50 mg total) by mouth daily. 90 tablet 3  . nystatin (MYCOSTATIN/NYSTOP) 100000 UNIT/GM POWD APPLY TO AFFECTED AREA TWICE A DAY 60 g 99  . ranitidine (ZANTAC) 300 MG tablet Take 1 to 2 tablets daily for heartburn to allow wean off of Prilosec/Omeprazole 180 tablet 99  . simvastatin (ZOCOR) 40 MG tablet TAKE 1 TABLET  EVERY DAY 90 tablet 3  . warfarin (COUMADIN) 5 MG tablet Take 1 tablet by mouth daily or as directed 90 tablet 1   No current facility-administered medications for this visit.    LABS/IMAGING: No results found for this or any previous visit (from the past 48 hour(s)). No results found.  VITALS: BP 130/66 mmHg  Pulse 66  Ht 5\' 9"  (1.753 m)  Wt 232 lb 4.8 oz (105.371 kg)  BMI 34.29 kg/m2  EXAM: General appearance: alert and no distress Neck: no carotid bruit, no JVD and thyroid not enlarged, symmetric, no tenderness/mass/nodules Lungs: clear to auscultation bilaterally Heart: regular rate and rhythm, S1, S2 normal, no murmur, click, rub or gallop Abdomen: soft, non-tender; bowel sounds normal; no masses,  no organomegaly Extremities: extremities normal, atraumatic, no cyanosis or edema Pulses: 2+ and symmetric Skin: Skin color, texture, turgor normal. No rashes or lesions Neurologic: Grossly  normal Psych: Mood, affect normal  EKG: Deferred  ASSESSMENT: 1. CAD status post 2 vessel CABG (SVG to diagonal SVG to PDA) in 2004 2. Status post ATS mechanical aortic valve 3. Lifetime anticoagulation on warfarin 4. HTN 5. Dyslipidemia 6. DM2  PLAN: 1.   Billy Reilly is doing well without any new cardiac problems. INR is therapeutic on warfarin at 3.5 today. Blood pressure is well-controlled. His cholesterol was recently assessed and is at goal.   Plan to see him back in 6 months for DOT exam and then annually thereafter.Marland Kitchen  [name], MD, [facility] Attending Cardiologist [place] 08/22/2015, 1:05 PM

## 2015-09-04 ENCOUNTER — Ambulatory Visit (INDEPENDENT_AMBULATORY_CARE_PROVIDER_SITE_OTHER): Payer: PPO | Admitting: Physician Assistant

## 2015-09-04 ENCOUNTER — Encounter: Payer: Self-pay | Admitting: Physician Assistant

## 2015-09-04 ENCOUNTER — Ambulatory Visit (HOSPITAL_COMMUNITY)
Admission: RE | Admit: 2015-09-04 | Discharge: 2015-09-04 | Disposition: A | Discharge status: Home / Self Care | Payer: PPO | Source: Ambulatory Visit | Attending: Physician Assistant | Admitting: Physician Assistant

## 2015-09-04 VITALS — BP 130/90 | HR 155 | Temp 99.3°F | Resp 14 | Ht 69.0 in | Wt 223.0 lb

## 2015-09-04 DIAGNOSIS — K449 Diaphragmatic hernia without obstruction or gangrene: Secondary | ICD-10-CM | POA: Diagnosis not present

## 2015-09-04 DIAGNOSIS — I2510 Atherosclerotic heart disease of native coronary artery without angina pectoris: Secondary | ICD-10-CM | POA: Insufficient documentation

## 2015-09-04 DIAGNOSIS — R251 Tremor, unspecified: Secondary | ICD-10-CM | POA: Diagnosis not present

## 2015-09-04 DIAGNOSIS — R32 Unspecified urinary incontinence: Secondary | ICD-10-CM | POA: Diagnosis not present

## 2015-09-04 DIAGNOSIS — J189 Pneumonia, unspecified organism: Secondary | ICD-10-CM | POA: Diagnosis not present

## 2015-09-04 DIAGNOSIS — R0609 Other forms of dyspnea: Secondary | ICD-10-CM | POA: Insufficient documentation

## 2015-09-04 DIAGNOSIS — J9811 Atelectasis: Secondary | ICD-10-CM | POA: Diagnosis not present

## 2015-09-04 DIAGNOSIS — R630 Anorexia: Secondary | ICD-10-CM | POA: Diagnosis not present

## 2015-09-04 DIAGNOSIS — Z952 Presence of prosthetic heart valve: Secondary | ICD-10-CM | POA: Diagnosis not present

## 2015-09-04 LAB — BASIC METABOLIC PANEL WITHOUT GFR
BUN: 21 mg/dL (ref 7–25)
CO2: 24 mmol/L (ref 20–31)
Calcium: 8.3 mg/dL — ABNORMAL LOW (ref 8.6–10.3)
Chloride: 92 mmol/L — ABNORMAL LOW (ref 98–110)
Creat: 1.12 mg/dL (ref 0.70–1.18)
GFR, Est African American: 75 mL/min
GFR, Est Non African American: 65 mL/min
Glucose, Bld: 203 mg/dL — ABNORMAL HIGH (ref 65–99)
Potassium: 3.7 mmol/L (ref 3.5–5.3)
Sodium: 128 mmol/L — ABNORMAL LOW (ref 135–146)

## 2015-09-04 LAB — CBC WITH DIFFERENTIAL/PLATELET
Basophils Absolute: 0 10*3/uL (ref 0.0–0.1)
Basophils Relative: 0 % (ref 0–1)
Eosinophils Absolute: 0 10*3/uL (ref 0.0–0.7)
Eosinophils Relative: 0 % (ref 0–5)
HCT: 35.8 % — ABNORMAL LOW (ref 39.0–52.0)
Hemoglobin: 11.4 g/dL — ABNORMAL LOW (ref 13.0–17.0)
Lymphocytes Relative: 7 % — ABNORMAL LOW (ref 12–46)
Lymphs Abs: 0.4 10*3/uL — ABNORMAL LOW (ref 0.7–4.0)
MCH: 23.7 pg — ABNORMAL LOW (ref 26.0–34.0)
MCHC: 31.8 g/dL (ref 30.0–36.0)
MCV: 74.4 fL — ABNORMAL LOW (ref 78.0–100.0)
MPV: 10.9 fL (ref 8.6–12.4)
Monocytes Absolute: 0.5 10*3/uL (ref 0.1–1.0)
Monocytes Relative: 8 % (ref 3–12)
Neutro Abs: 5 10*3/uL (ref 1.7–7.7)
Neutrophils Relative %: 85 % — ABNORMAL HIGH (ref 43–77)
Platelets: 208 10*3/uL (ref 150–400)
RBC: 4.81 MIL/uL (ref 4.22–5.81)
RDW: 18 % — ABNORMAL HIGH (ref 11.5–15.5)
WBC: 5.9 10*3/uL (ref 4.0–10.5)

## 2015-09-04 LAB — HEPATIC FUNCTION PANEL
ALT: 39 U/L (ref 9–46)
AST: 63 U/L — ABNORMAL HIGH (ref 10–35)
Albumin: 3.5 g/dL — ABNORMAL LOW (ref 3.6–5.1)
Alkaline Phosphatase: 66 U/L (ref 40–115)
Bilirubin, Direct: 0.3 mg/dL — ABNORMAL HIGH
Indirect Bilirubin: 0.8 mg/dL (ref 0.2–1.2)
Total Bilirubin: 1.1 mg/dL (ref 0.2–1.2)
Total Protein: 6 g/dL — ABNORMAL LOW (ref 6.1–8.1)

## 2015-09-04 MED ORDER — DOXYCYCLINE HYCLATE 100 MG PO CAPS: ORAL_CAPSULE | ORAL | Status: DC

## 2015-09-04 NOTE — Patient Instructions (Addendum)
While on the doxycycline cut back on your coumadin to 1/2 tablet every day WHILE on the antibiotic but can go back to  1 tablet each Monday, Wednesday and Friday, 1/2 tablet all other days once off the antibiotic, need recheck 2 weeks.  Go to the ER if any CP, SOB, nausea, dizziness, severe HA, changes vision/speech  Shortness of Breath Shortness of breath means you have trouble breathing. It could also mean that you have a medical problem. You should get immediate medical care for shortness of breath. CAUSES   Not enough oxygen in the air such as with high altitudes or a smoke-filled room.  Certain lung diseases, infections, or problems.  Heart disease or conditions, such as angina or heart failure.  Low red blood cells (anemia).  Poor physical fitness, which can cause shortness of breath when you exercise.  Chest or back injuries or stiffness.  Being overweight.  Smoking.  Anxiety, which can make you feel like you are not getting enough air. DIAGNOSIS  Serious medical problems can often be found during your physical exam. Tests may also be done to determine why you are having shortness of breath. Tests may include:  Chest X-rays.  Lung function tests.  Blood tests.  An electrocardiogram (ECG).  An ambulatory electrocardiogram. An ambulatory ECG records your heartbeat patterns over a 24-hour period.  Exercise testing.  A transthoracic echocardiogram (TTE). During echocardiography, sound waves are used to evaluate how blood flows through your heart.  A transesophageal echocardiogram (TEE).  Imaging scans. Your health care provider may not be able to find a cause for your shortness of breath after your exam. In this case, it is important to have a follow-up exam with your health care provider as directed.  TREATMENT  Treatment for shortness of breath depends on the cause of your symptoms and can vary greatly. HOME CARE INSTRUCTIONS   Do not smoke. Smoking is a common  cause of shortness of breath. If you smoke, ask for help to quit.  Avoid being around chemicals or things that may bother your breathing, such as paint fumes and dust.  Rest as needed. Slowly resume your usual activities.  If medicines were prescribed, take them as directed for the full length of time directed. This includes oxygen and any inhaled medicines.  Keep all follow-up appointments as directed by your health care provider. SEEK MEDICAL CARE IF:   Your condition does not improve in the time expected.  You have a hard time doing your normal activities even with rest.  You have any new symptoms. SEEK IMMEDIATE MEDICAL CARE IF:   Your shortness of breath gets worse.  You feel light-headed, faint, or develop a cough not controlled with medicines.  You start coughing up blood.  You have pain with breathing.  You have chest pain or pain in your arms, shoulders, or abdomen.  You have a fever.  You are unable to walk up stairs or exercise the way you normally do. MAKE SURE YOU:  Understand these instructions.  Will watch your condition.  Will get help right away if you are not doing well or get worse.   This information is not intended to replace advice given to you by your health care provider. Make sure you discuss any questions you have with your health care provider.   Document Released: 08/05/2001 Document Revised: 11/15/2013 Document Reviewed: 01/26/2012 Elsevier Interactive Patient Education [employer].

## 2015-09-04 NOTE — Progress Notes (Signed)
Subjective:    Patient ID: Billy Reilly, male    DOB: 09-06-1941, 74 y.o.   MRN: [id number]  HPI 74 y.o. 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 Wife is here and states that he has had incontinence since Friday that is new. Weight is down 10 lbs, no fluid overload symptoms. Denies CP, AB pain, back pain, palpitations.   Blood pressure 130/90, pulse 155, temperature 99.3 F (37.4 C), temperature source Temporal, resp. rate 14, height 5\' 9"  (1.753 m), weight 223 lb (101.152 kg), SpO2 97 %.   Wt Readings from Last 3 Encounters:  09/04/15 223 lb (101.152 kg)  08/22/15 232 lb 4.8 oz (105.371 kg)  06/08/15 231 lb (104.781 kg)    Past Medical History  Diagnosis Date  . Hypertension   . GERD (gastroesophageal reflux disease)   . Cancer [facility])     prostate  . Anemia   . Type II or unspecified type diabetes mellitus without mention of complication, not stated as uncontrolled   . Hyperlipidemia   . Umbilical hernia   . AS (aortic stenosis)     valve replacement-st. jude mechanical valve  . CAD (coronary artery disease), native coronary artery     CABG 07/18/2003  . History of nuclear stress test 02/12/10    EF 67%, normal perfusion  . ASHD (arteriosclerotic heart disease)   . Vitamin D deficiency   . Vitamin B12 deficiency   . Morbid obesity ([place])    Current Outpatient Prescriptions on File Prior to Visit  Medication Sig Dispense Refill  . aspirin 81 MG tablet Take 81 mg by mouth daily.      . clotrimazole-betamethasone (LOTRISONE) cream Apply 1 application topically 2 (two) times daily. 15 g 2  . cyanocobalamin (,VITAMIN B-12,) 1000 MCG/ML injection INJECT 1/2 ML EVERY MONTH 10 mL 1  . enalapril (VASOTEC) 20 MG tablet Take 2 tablets (40 mg  total) by mouth daily. 180 tablet 3  . FARXIGA 10 MG TABS tablet TAKE 1 TABLET BY MOUTH EVERY DAY FOR DIABETES 30 tablet 3  . glipiZIDE (GLUCOTROL) 10 MG tablet Take 1 tablet (10 mg total) by mouth 3 (three) times daily before meals. 270 tablet 3  . hydrochlorothiazide (HYDRODIURIL) 25 MG tablet TAKE 1 TABLET  EVERY DAY 90 tablet 0  . metFORMIN (GLUCOPHAGE-XR) 500 MG 24 hr tablet TAKE 2 TABLETS TWICE DAILY WITH A MEAL 360 tablet 1  . metoprolol (LOPRESSOR) 50 MG tablet Take 1 tablet (50 mg total) by mouth daily. 90 tablet 3  . nystatin (MYCOSTATIN/NYSTOP) 100000 UNIT/GM POWD APPLY TO AFFECTED AREA TWICE A DAY 60 g 99  . ranitidine (ZANTAC) 300 MG tablet Take 1 to 2 tablets daily for heartburn to allow wean off of Prilosec/Omeprazole 180 tablet 99  . simvastatin (ZOCOR) 40 MG tablet TAKE 1 TABLET  EVERY DAY 90 tablet 3  . warfarin (COUMADIN) 5 MG tablet Take 1 tablet by mouth daily or as directed 90 tablet 1   No current facility-administered medications on file prior to visit.   Review of Systems  Constitutional: Positive for chills, activity change, appetite change and fatigue. Negative for fever, diaphoresis and unexpected weight change.  HENT: Negative.  Negative for congestion, postnasal drip, rhinorrhea, sinus pressure, sore  throat and trouble swallowing.   Eyes: Negative.   Respiratory: Positive for shortness of breath. Negative for apnea, cough, choking, chest tightness, wheezing and stridor.   Cardiovascular: Negative for chest pain, palpitations and leg swelling.  Gastrointestinal: Positive for nausea and constipation (BM yesterday, size of pencil). Negative for vomiting, abdominal pain, diarrhea, blood in stool, abdominal distention, anal bleeding and rectal pain.  Genitourinary: Negative for dysuria, urgency, frequency, hematuria, flank pain, decreased urine volume, discharge, penile swelling, scrotal swelling, enuresis, difficulty urinating, genital sores, penile pain and testicular  pain.       + new incontinence  Musculoskeletal: Positive for myalgias and arthralgias. Negative for back pain, joint swelling, gait problem, neck pain and neck stiffness.  Skin: Negative.   Neurological: Positive for tremors (hands and head). Negative for dizziness, seizures, syncope, facial asymmetry, speech difficulty, weakness, light-headedness, numbness and headaches.  Psychiatric/Behavioral: Negative.        Objective:   Physical Exam  Constitutional: He is oriented to person, place, and time. He appears well-developed.  HENT:  Head: Normocephalic and atraumatic.  Right Ear: External ear normal.  Left Ear: External ear normal.  Eyes: Conjunctivae are normal. Pupils are equal, round, and reactive to light.  Neck: Normal range of motion. Neck supple.  Cardiovascular: Normal pulses.  An irregularly irregular rhythm present. Tachycardia present.   Systolic click  Pulmonary/Chest: No accessory muscle usage. He is in respiratory distress. He has no decreased breath sounds. He has wheezes in the left middle field and the left lower field. He has rhonchi. He has no rales.  Abdominal: Soft. There is no tenderness. There is no rebound and no CVA tenderness.  Lymphadenopathy:    He has no cervical adenopathy.  Neurological: He is alert and oriented to person, place, and time. No cranial nerve deficit.  + tremor of head, able to stop if pointed out, intention tremor of hand.   Skin: He is not diaphoretic.       Assessment & Plan:  LLL with rales/rhonchi with dyspnea with exertion Get CXR, check labs, doxycycline  Afib with RVR/ s/p AVR  Monitor, if any worsening SOB go to ER Will cut back on coumadin while on doxy, recheck next OV, goal 2.5-3.5 1 tablet each Monday, Wednesday and Friday, 1/2 tablet all other days but he will do 1/2 tablet every day while on the ABX Lab Results  Component Value Date   INR 3.5 08/22/2015   INR 2.8 07/25/2015   INR 2.7 06/20/2015    New  incontinence, tremor, no medications Check sugar- 224 in the office since not taking meds due to decreased eating,  check labs including urine No medications that should cause extra pyramidal symptoms.  Will check electrolytes

## 2015-09-05 LAB — URINALYSIS, ROUTINE W REFLEX MICROSCOPIC
Bilirubin Urine: NEGATIVE
Leukocytes, UA: NEGATIVE
Nitrite: NEGATIVE
Specific Gravity, Urine: 1.039 — ABNORMAL HIGH (ref 1.001–1.035)
pH: 5.5 (ref 5.0–8.0)

## 2015-09-05 LAB — URINALYSIS, MICROSCOPIC ONLY
Bacteria, UA: NONE SEEN [HPF]
Casts: NONE SEEN [LPF]
Crystals: NONE SEEN [HPF]
Squamous Epithelial / HPF: NONE SEEN [HPF]
Yeast: NONE SEEN [HPF]

## 2015-09-05 LAB — URINE CULTURE
Colony Count: NO GROWTH
Organism ID, Bacteria: NO GROWTH

## 2015-09-10 ENCOUNTER — Other Ambulatory Visit: Payer: Self-pay | Admitting: Internal Medicine

## 2015-09-10 ENCOUNTER — Ambulatory Visit (INDEPENDENT_AMBULATORY_CARE_PROVIDER_SITE_OTHER): Payer: PPO | Admitting: Internal Medicine

## 2015-09-10 ENCOUNTER — Encounter: Payer: Self-pay | Admitting: Internal Medicine

## 2015-09-10 VITALS — BP 110/62 | HR 64 | Temp 97.9°F | Resp 16 | Ht 69.5 in | Wt 219.4 lb

## 2015-09-10 DIAGNOSIS — I2583 Coronary atherosclerosis due to lipid rich plaque: Secondary | ICD-10-CM

## 2015-09-10 DIAGNOSIS — E559 Vitamin D deficiency, unspecified: Secondary | ICD-10-CM

## 2015-09-10 DIAGNOSIS — E1121 Type 2 diabetes mellitus with diabetic nephropathy: Secondary | ICD-10-CM

## 2015-09-10 DIAGNOSIS — Z6831 Body mass index (BMI) 31.0-31.9, adult: Secondary | ICD-10-CM

## 2015-09-10 DIAGNOSIS — Z1331 Encounter for screening for depression: Secondary | ICD-10-CM

## 2015-09-10 DIAGNOSIS — Z1212 Encounter for screening for malignant neoplasm of rectum: Secondary | ICD-10-CM

## 2015-09-10 DIAGNOSIS — Z0001 Encounter for general adult medical examination with abnormal findings: Secondary | ICD-10-CM

## 2015-09-10 DIAGNOSIS — Z0000 Encounter for general adult medical examination without abnormal findings: Secondary | ICD-10-CM | POA: Diagnosis not present

## 2015-09-10 DIAGNOSIS — E66811 Obesity, class 1: Secondary | ICD-10-CM | POA: Insufficient documentation

## 2015-09-10 DIAGNOSIS — E669 Obesity, unspecified: Secondary | ICD-10-CM | POA: Insufficient documentation

## 2015-09-10 DIAGNOSIS — I10 Essential (primary) hypertension: Secondary | ICD-10-CM

## 2015-09-10 DIAGNOSIS — Z125 Encounter for screening for malignant neoplasm of prostate: Secondary | ICD-10-CM

## 2015-09-10 DIAGNOSIS — I2510 Atherosclerotic heart disease of native coronary artery without angina pectoris: Secondary | ICD-10-CM

## 2015-09-10 DIAGNOSIS — Z79899 Other long term (current) drug therapy: Secondary | ICD-10-CM

## 2015-09-10 DIAGNOSIS — Z9181 History of falling: Secondary | ICD-10-CM

## 2015-09-10 DIAGNOSIS — Z952 Presence of prosthetic heart valve: Secondary | ICD-10-CM

## 2015-09-10 DIAGNOSIS — E538 Deficiency of other specified B group vitamins: Secondary | ICD-10-CM

## 2015-09-10 DIAGNOSIS — E785 Hyperlipidemia, unspecified: Secondary | ICD-10-CM

## 2015-09-10 NOTE — Patient Instructions (Signed)

## 2015-09-10 NOTE — Progress Notes (Addendum)
Patient ID: Billy Reilly, male   DOB: 06-29-1941, 74 y.o.   MRN: [id number]   Annual Preventative Visit and Comprehensive Evaluation,  Examination & Management  This very nice 74 y.o. MWM presents for presents his annual preventative visit and also for a comprehensive evaluation, examination and management of multiple medical co-morbidities.  Patient has been followed for HTN, Insulin Requiring T2_DM, ASCAD/CABG, s/p AoVR, Hyperlipidemia, and Vitamin D Deficiency. Patient was in recently and treated for a PNA and feels fully recovered.    HTN predates since 47. In 2004, Patient underwent CABG & AoVR and has been maintained on coumadin and done very well. Patient's BP has been controlled at home.Today's BP: 110/62 mmHg.Patient is followed by Dr Sallyanne Kuster.  Patient denies any cardiac symptoms as chest pain, palpitations, shortness of breath, dizziness or ankle swelling.   Patient's hyperlipidemia is controlled with diet and medications. Patient denies myalgias or other medication SE's. Last lipids were at goal with Cholesterol 127; HDL 33*; LDL 66; Triglycerides 140 on 06/08/2015.    Patient has Insulin Requiring T2_DM prediabetes since 2004 and has come off of his insulin with improved diet, weight loss and add'n of Farxiga to his regimen and patient denies reactive hypoglycemic symptoms, visual blurring, diabetic polys or paresthesias. 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  Patient had been treated with Actos & then insulin and gained massive amount of weight until these were stopped and he has lost 70 # over the last several years after the insulin was d/c'd.  Last A1c was 7.1% on 06/08/2015   Finally, patient has history of Vitamin D Deficiency of 28 in 2012 and last vitamin D was still very low at 33 on 06/08/2015.     Medication Sig  . aspirin 81 MG tablet Take 81 mg by mouth daily.    . clotrimazole-betamethasone (LOTRISONE) crm Apply 1 application topically 2 (two) times  daily.  Marland Kitchen VITAMIN B-12 1000 MCG/ML inj INJECT 1/2 ML EVERY MONTH  . doxycycline  100 MG capsule then take 1 capsule  2 x day on a full stomach for infection for 10 days  . enalapril  20 MG tablet Take 2 tablets (40 mg total) by mouth daily.  Marland Kitchen FARXIGA 10 MG TABS tablet TAKE 1 TABLET BY MOUTH EVERY DAY FOR DIABETES  . glipiZIDE  10 MG tablet Take 1 tablet (10 mg total) by mouth 3 (three) times daily before meals.  . hctz 25 MG tablet TAKE 1 TABLET  EVERY DAY  . metFORMIN -XR 500 MG 24 hr tablet TAKE 2 TABLETS TWICE DAILY WITH A MEAL  . metoprolol (LOPRESSOR) 50 MG  Take 1 tablet (50 mg total) by mouth daily.  Marland Kitchen Nystatin 100000 UNIT/GM POWD APPLY TO AFFECTED AREA TWICE A DAY  . ranitidine  300 MG tablet Take 1 to 2 tablets daily for heartburn to allow wean off of Prilosec/Omeprazole  . simvastatin  40 MG tablet TAKE 1 TABLET  EVERY DAY  . warfarin 5 MG tablet Take 1 tablet by mouth daily or as directed   No Known Allergies   Past Medical History  Diagnosis Date  . Hypertension   . GERD (gastroesophageal reflux disease)   . Cancer [facility])     prostate  . Anemia   . Type II or unspecified type diabetes mellitus without mention of complication, not stated as uncontrolled   . Hyperlipidemia   . Umbilical hernia   . AS (aortic stenosis)  valve replacement-st. jude mechanical valve  . CAD (coronary artery disease), native coronary artery     CABG 07/18/2003  . History of nuclear stress test 02/12/10    EF 67%, normal perfusion  . ASHD (arteriosclerotic heart disease)   . Vitamin D deficiency   . Vitamin B12 deficiency   . Morbid obesity [facility])    Health Maintenance  Topic Date Due  . ZOSTAVAX  09/02/2002  . PNA vac Low Risk Adult (2 of 2 - PCV13) 09/01/2013  . INFLUENZA VACCINE  06/25/2015  . OPHTHALMOLOGY EXAM  10/25/2015  . HEMOGLOBIN A1C  03/10/2016  . FOOT EXAM  09/09/2016  . URINE MICROALBUMIN  09/09/2016  . TETANUS/TDAP  11/24/2017  . COLONOSCOPY  10/14/2020    Immunization History  Administered Date(s) Administered  . Influenza Whole 09/01/2012  . Pneumococcal Polysaccharide-23 09/01/2012  . Tdap 11/25/2007   Past Surgical History  Procedure Laterality Date  . Aortic valve replacement  07/18/2003    56mm ATS-AP valve  . Hernia repair  [id number]    RIH, umbilical  . Aortic valve replacement (avr)/coronary artery bypass grafting (cabg)  07/18/2003    AVR; bypass x 2-SVG to first diag, SVG to posterior descending  . Cardiac catheterization  07/13/2003    AS-aortic valve gradient 64mmhg, valve area 1.1; obstruction of ostium of posterior lateral branch with obstruction in the ostium of first diagnonal  . Tee without cardioversion  07/18/2003    no evidence of PE, Aortic valve heavily calcified,   . Transthoracic echocardiogram  02/21/2013    EF60-65%, Septal motion showed paradox. atrium moderately dilated  . Nm myocar perf wall motion  02/12/2010    protocol:Bruce, EF 67%, exercise cap 7 METS, low risk scan   Family History  Problem Relation Age of Onset  . Stroke Mother   . Heart attack Father   . Heart disease Father   . Cancer Brother   . Cancer Sister    Social History   Social History  . Marital Status: Married    Spouse Name: Beverlee Nims  . Number of Children: N/A  . Years of Education: N/A   Occupational History  . Truck Geophysicist/field seismologist   Social History Main Topics  . Smoking status: Never Smoker   . Smokeless tobacco: Never Used  . Alcohol Use: No  . Drug Use: No  . Sexual Activity: Not on file    ROS Constitutional: Denies fever, chills, weight loss/gain, headaches, insomnia,  night sweats or change in appetite. Does c/o fatigue. Eyes: Denies redness, blurred vision, diplopia, discharge, itchy or watery eyes.  ENT: Denies discharge, congestion, post nasal drip, epistaxis, sore throat, earache, hearing loss, dental pain, Tinnitus, Vertigo, Sinus pain or snoring.  Cardio: Denies chest pain, palpitations, irregular heartbeat,  syncope, dyspnea, diaphoresis, orthopnea, PND, claudication or edema Respiratory: denies cough, dyspnea, DOE, pleurisy, hoarseness, laryngitis or wheezing.  Gastrointestinal: Denies dysphagia, heartburn, reflux, water brash, pain, cramps, nausea, vomiting, bloating, diarrhea, constipation, hematemesis, melena, hematochezia, jaundice or hemorrhoids Genitourinary: Denies dysuria, frequency, urgency, nocturia, hesitancy, discharge, hematuria or flank pain Musculoskeletal: Denies arthralgia, myalgia, stiffness, Jt. Swelling, pain, limp or strain/sprain. Denies Falls. Skin: Denies puritis, rash, hives, warts, acne, eczema or change in skin lesion Neuro: No weakness, tremor, incoordination, spasms, paresthesia or pain Psychiatric: Denies confusion, memory loss or sensory loss. Denies Depression. Endocrine: Denies change in weight, skin, hair change, nocturia, and paresthesia, diabetic polys, visual blurring or hyper / hypo glycemic episodes.  Heme/Lymph: No excessive bleeding, bruising or enlarged lymph nodes.  Physical Exam  BP 110/62 mmHg  Pulse 64  Temp(Src) 97.9 F (36.6 C)  Resp 16  Ht 5' 9.5" (1.765 m)  Wt 219 lb 6.4 oz (99.519 kg)  BMI 31.95 kg/m2  General Appearance: Well nourished &  in no apparent distress. Eyes: PERRLA, EOMs, conjunctiva no swelling or erythema, normal fundi and vessels. Sinuses: No frontal/maxillary tenderness ENT/Mouth: EACs patent / TMs  nl. Nares clear without erythema, swelling, mucoid exudates. Oral hygiene is good. No erythema, swelling, or exudate. Tongue normal, non-obstructing. Tonsils not swollen or erythematous. Hearing normal.  Neck: Supple, thyroid normal. No bruits, nodes or JVD. Respiratory: Respiratory effort normal.  BS equal and clear bilateral without rales, rhonci, wheezing or stridor. Cardio: Heart sounds are normal with regular rate and rhythm and no murmurs, rubs or gallops. Peripheral pulses are normal and equal bilaterally without edema. No  aortic or femoral bruits. Chest: symmetric with normal excursions and percussion.  Abdomen: Flat, soft, with bowel sounds. Nontender, no guarding, rebound, hernias, masses, or organomegaly.  Lymphatics: Non tender without lymphadenopathy.  Genitourinary: No hernias.Testes nl. DRE - prostate nl for age - smooth & firm w/o nodules. Musculoskeletal: Full ROM all peripheral extremities, joint stability, 5/5 strength, and normal gait. Skin: Warm and dry without rashes, lesions, cyanosis, clubbing or  ecchymosis.  Neuro: Cranial nerves intact, reflexes equal bilaterally. Normal muscle tone, no cerebellar symptoms. Sensation intact bilaterally by monofilament testing to the toes.   Pysch: Alert and oriented X 3 with normal affect, insight and judgment appropriate.   Assessment and Plan  1. Encounter for general adult medical examination with abnormal findings  - TSH  2. Essential hypertension  - [place], RETROPERITNL ABD,  LTD  3. Hyperlipidemia  - Lipid panel  4. Type II diabetes mellitus with nephropathy (HCC)  - Microalbumin / creatinine urine ratio - HM DIABETES FOOT EXAM - LOW EXTREMITY NEUR EXAM DOCUM - Hemoglobin A1c  5. Vitamin D deficiency  - Vit D  25 hydroxy (rtn osteoporosis monitoring)  6. Morbid obesity, unspecified obesity type ([place])   7. Coronary artery disease due to lipid rich plaque   8. S/P AVR   9. Vitamin B12 deficiency  - Vitamin B12  10. BMI 31.94,adult   11. Medication management  - Urinalysis, Routine w reflex microscopic (not at [facility]) - CBC with Differential/Platelet - BASIC METABOLIC PANEL WITH GFR - Hepatic function panel - Magnesium  12. Screening for rectal cancer  - POC Hemoccult Bld/Stl (3-Cd Home Screen); Future  13. Prostate cancer screening  - PSA  14. At low risk for fall   15. Depression screen   Continue prudent diet as discussed, weight control, BP monitoring, regular exercise, and medications as discussed.  Discussed  med effects and SE's. Routine screening labs and tests as requested with regular follow-up as recommended.  Over 40 minutes of exam, counseling &  chart review was performed

## 2015-09-11 LAB — HEPATIC FUNCTION PANEL
ALT: 33 U/L (ref 9–46)
AST: 23 U/L (ref 10–35)
Albumin: 3.3 g/dL — ABNORMAL LOW (ref 3.6–5.1)
Alkaline Phosphatase: 93 U/L (ref 40–115)
Bilirubin, Direct: 0.3 mg/dL — ABNORMAL HIGH
Indirect Bilirubin: 0.6 mg/dL (ref 0.2–1.2)
Total Bilirubin: 0.9 mg/dL (ref 0.2–1.2)
Total Protein: 5.7 g/dL — ABNORMAL LOW (ref 6.1–8.1)

## 2015-09-11 LAB — URINALYSIS, MICROSCOPIC ONLY
Bacteria, UA: NONE SEEN [HPF]
Casts: NONE SEEN [LPF]
RBC / HPF: NONE SEEN RBC/HPF
Yeast: NONE SEEN [HPF]

## 2015-09-11 LAB — CBC WITH DIFFERENTIAL/PLATELET
Basophils Absolute: 0 10*3/uL (ref 0.0–0.1)
Basophils Relative: 0 % (ref 0–1)
Eosinophils Absolute: 0.1 10*3/uL (ref 0.0–0.7)
Eosinophils Relative: 1 % (ref 0–5)
HCT: 35.4 % — ABNORMAL LOW (ref 39.0–52.0)
Hemoglobin: 10.6 g/dL — ABNORMAL LOW (ref 13.0–17.0)
Lymphocytes Relative: 8 % — ABNORMAL LOW (ref 12–46)
Lymphs Abs: 0.8 10*3/uL (ref 0.7–4.0)
MCH: 23.1 pg — ABNORMAL LOW (ref 26.0–34.0)
MCHC: 29.9 g/dL — ABNORMAL LOW (ref 30.0–36.0)
MCV: 77.3 fL — ABNORMAL LOW (ref 78.0–100.0)
MPV: 10.1 fL (ref 8.6–12.4)
Monocytes Absolute: 0.5 10*3/uL (ref 0.1–1.0)
Monocytes Relative: 5 % (ref 3–12)
Neutro Abs: 8.4 10*3/uL — ABNORMAL HIGH (ref 1.7–7.7)
Neutrophils Relative %: 86 % — ABNORMAL HIGH (ref 43–77)
Platelets: 344 10*3/uL (ref 150–400)
RBC: 4.58 MIL/uL (ref 4.22–5.81)
RDW: 19.1 % — ABNORMAL HIGH (ref 11.5–15.5)
WBC: 9.8 10*3/uL (ref 4.0–10.5)

## 2015-09-11 LAB — BASIC METABOLIC PANEL WITHOUT GFR
BUN: 23 mg/dL (ref 7–25)
CO2: 25 mmol/L (ref 20–31)
Calcium: 8.9 mg/dL (ref 8.6–10.3)
Chloride: 101 mmol/L (ref 98–110)
Creat: 0.9 mg/dL (ref 0.70–1.18)
GFR, Est African American: >89 mL/min
GFR, Est Non African American: 84 mL/min
Glucose, Bld: 162 mg/dL — ABNORMAL HIGH (ref 65–99)
Potassium: 4 mmol/L (ref 3.5–5.3)
Sodium: 134 mmol/L — ABNORMAL LOW (ref 135–146)

## 2015-09-11 LAB — URINALYSIS, ROUTINE W REFLEX MICROSCOPIC
Bilirubin Urine: NEGATIVE
Hgb urine dipstick: NEGATIVE
Nitrite: NEGATIVE
Specific Gravity, Urine: 1.025 (ref 1.001–1.035)
pH: 5.5 (ref 5.0–8.0)

## 2015-09-11 LAB — LIPID PANEL
Cholesterol: 94 mg/dL — ABNORMAL LOW (ref 125–200)
HDL: 25 mg/dL — ABNORMAL LOW
LDL Cholesterol: 42 mg/dL
Total CHOL/HDL Ratio: 3.8 ratio
Triglycerides: 134 mg/dL
VLDL: 27 mg/dL

## 2015-09-11 LAB — TSH: TSH: 0.771 u[IU]/mL (ref 0.350–4.500)

## 2015-09-11 LAB — VITAMIN D 25 HYDROXY (VIT D DEFICIENCY, FRACTURES): Vit D, 25-Hydroxy: 41 ng/mL (ref 30–100)

## 2015-09-11 LAB — HEMOGLOBIN A1C
Hgb A1c MFr Bld: 7.4 % — ABNORMAL HIGH
Mean Plasma Glucose: 166 mg/dL — ABNORMAL HIGH

## 2015-09-11 LAB — MICROALBUMIN / CREATININE URINE RATIO
Creatinine, Urine: 413 mg/dL — ABNORMAL HIGH (ref 20–370)
Microalb Creat Ratio: 9 ug/mg{creat}
Microalb, Ur: 3.6 mg/dL

## 2015-09-11 LAB — VITAMIN B12: Vitamin B-12: 1481 pg/mL — ABNORMAL HIGH (ref 211–911)

## 2015-09-11 LAB — PSA: PSA: 0.07 ng/mL

## 2015-09-11 LAB — MAGNESIUM: Magnesium: 1.4 mg/dL — ABNORMAL LOW (ref 1.5–2.5)

## 2015-09-19 ENCOUNTER — Ambulatory Visit (INDEPENDENT_AMBULATORY_CARE_PROVIDER_SITE_OTHER): Payer: PPO | Admitting: Pharmacist Clinician (PhC)/ Clinical Pharmacy Specialist

## 2015-09-19 DIAGNOSIS — Z954 Presence of other heart-valve replacement: Secondary | ICD-10-CM | POA: Diagnosis not present

## 2015-09-19 DIAGNOSIS — Z7901 Long term (current) use of anticoagulants: Secondary | ICD-10-CM

## 2015-09-19 DIAGNOSIS — Z952 Presence of prosthetic heart valve: Secondary | ICD-10-CM

## 2015-09-19 LAB — POCT INR: INR: 4.4

## 2015-09-27 ENCOUNTER — Ambulatory Visit (INDEPENDENT_AMBULATORY_CARE_PROVIDER_SITE_OTHER): Payer: PPO

## 2015-09-27 DIAGNOSIS — D649 Anemia, unspecified: Secondary | ICD-10-CM

## 2015-09-27 DIAGNOSIS — Z23 Encounter for immunization: Secondary | ICD-10-CM

## 2015-09-27 DIAGNOSIS — E538 Deficiency of other specified B group vitamins: Secondary | ICD-10-CM

## 2015-09-27 DIAGNOSIS — E559 Vitamin D deficiency, unspecified: Secondary | ICD-10-CM

## 2015-09-27 LAB — CBC WITH DIFFERENTIAL/PLATELET
Basophils Absolute: 0 10*3/uL (ref 0.0–0.1)
Basophils Relative: 0 % (ref 0–1)
Eosinophils Absolute: 0.1 10*3/uL (ref 0.0–0.7)
Eosinophils Relative: 4 % (ref 0–5)
HCT: 32.2 % — ABNORMAL LOW (ref 39.0–52.0)
Hemoglobin: 9.7 g/dL — ABNORMAL LOW (ref 13.0–17.0)
Lymphocytes Relative: 24 % (ref 12–46)
Lymphs Abs: 0.9 10*3/uL (ref 0.7–4.0)
MCH: 24 pg — ABNORMAL LOW (ref 26.0–34.0)
MCHC: 30.1 g/dL (ref 30.0–36.0)
MCV: 79.5 fL (ref 78.0–100.0)
MPV: 10.7 fL (ref 8.6–12.4)
Monocytes Absolute: 0.3 10*3/uL (ref 0.1–1.0)
Monocytes Relative: 9 % (ref 3–12)
Neutro Abs: 2.3 10*3/uL (ref 1.7–7.7)
Neutrophils Relative %: 63 % (ref 43–77)
Platelets: 157 10*3/uL (ref 150–400)
RBC: 4.05 MIL/uL — ABNORMAL LOW (ref 4.22–5.81)
RDW: 20.2 % — ABNORMAL HIGH (ref 11.5–15.5)
WBC: 3.7 10*3/uL — ABNORMAL LOW (ref 4.0–10.5)

## 2015-09-27 LAB — RETICULOCYTES
ABS Retic: 97.2 10*3/uL (ref 19.0–186.0)
RBC.: 4.05 MIL/uL — ABNORMAL LOW (ref 4.22–5.81)
Retic Ct Pct: 2.4 % — ABNORMAL HIGH (ref 0.4–2.3)

## 2015-09-28 LAB — FOLATE RBC: RBC Folate: 1115 ng/mL

## 2015-09-28 LAB — IRON AND TIBC
%SAT: 6 % — ABNORMAL LOW (ref 15–60)
Iron: 25 ug/dL — ABNORMAL LOW (ref 50–180)
TIBC: 407 ug/dL (ref 250–425)
UIBC: 382 ug/dL (ref 125–400)

## 2015-09-28 LAB — VITAMIN B12: Vitamin B-12: 1760 pg/mL — ABNORMAL HIGH (ref 211–911)

## 2015-10-10 ENCOUNTER — Ambulatory Visit (INDEPENDENT_AMBULATORY_CARE_PROVIDER_SITE_OTHER): Payer: PPO | Admitting: Physician Assistant

## 2015-10-10 ENCOUNTER — Ambulatory Visit (INDEPENDENT_AMBULATORY_CARE_PROVIDER_SITE_OTHER): Payer: PPO | Admitting: Pharmacist Clinician (PhC)/ Clinical Pharmacy Specialist

## 2015-10-10 ENCOUNTER — Encounter: Payer: Self-pay | Admitting: Physician Assistant

## 2015-10-10 VITALS — BP 128/70 | HR 67 | Temp 97.9°F | Resp 14 | Ht 69.5 in | Wt 229.0 lb

## 2015-10-10 DIAGNOSIS — Z954 Presence of other heart-valve replacement: Secondary | ICD-10-CM

## 2015-10-10 DIAGNOSIS — Z7901 Long term (current) use of anticoagulants: Secondary | ICD-10-CM

## 2015-10-10 DIAGNOSIS — Z952 Presence of prosthetic heart valve: Secondary | ICD-10-CM

## 2015-10-10 DIAGNOSIS — D509 Iron deficiency anemia, unspecified: Secondary | ICD-10-CM | POA: Diagnosis not present

## 2015-10-10 LAB — IRON AND TIBC
%SAT: 5 % — ABNORMAL LOW (ref 15–60)
Iron: 24 ug/dL — ABNORMAL LOW (ref 50–180)
TIBC: 508 ug/dL — ABNORMAL HIGH (ref 250–425)
UIBC: 484 ug/dL — ABNORMAL HIGH (ref 125–400)

## 2015-10-10 LAB — CBC WITH DIFFERENTIAL/PLATELET
Basophils Absolute: 0.1 10*3/uL (ref 0.0–0.1)
Basophils Relative: 1 % (ref 0–1)
Eosinophils Absolute: 0.1 10*3/uL (ref 0.0–0.7)
Eosinophils Relative: 2 % (ref 0–5)
HCT: 33 % — ABNORMAL LOW (ref 39.0–52.0)
Hemoglobin: 10.2 g/dL — ABNORMAL LOW (ref 13.0–17.0)
Lymphocytes Relative: 19 % (ref 12–46)
Lymphs Abs: 1.1 10*3/uL (ref 0.7–4.0)
MCH: 23.6 pg — ABNORMAL LOW (ref 26.0–34.0)
MCHC: 30.9 g/dL (ref 30.0–36.0)
MCV: 76.4 fL — ABNORMAL LOW (ref 78.0–100.0)
MPV: 9.9 fL (ref 8.6–12.4)
Monocytes Absolute: 0.5 10*3/uL (ref 0.1–1.0)
Monocytes Relative: 8 % (ref 3–12)
Neutro Abs: 4.1 10*3/uL (ref 1.7–7.7)
Neutrophils Relative %: 70 % (ref 43–77)
Platelets: 262 10*3/uL (ref 150–400)
RBC: 4.32 MIL/uL (ref 4.22–5.81)
RDW: 18.8 % — ABNORMAL HIGH (ref 11.5–15.5)
WBC: 5.9 10*3/uL (ref 4.0–10.5)

## 2015-10-10 LAB — FERRITIN: Ferritin: 8 ng/mL — ABNORMAL LOW (ref 22–322)

## 2015-10-10 LAB — RETICULOCYTES
ABS Retic: 73.4 10*3/uL (ref 19.0–186.0)
RBC.: 4.32 MIL/uL (ref 4.22–5.81)
Retic Ct Pct: 1.7 % (ref 0.4–2.3)

## 2015-10-10 LAB — POCT INR: INR: 2.9

## 2015-10-10 NOTE — Patient Instructions (Signed)
Add on iron pill 325 or 65 mg daily WITH vitamin C 500mg  for 2 weeks, then can go to every other day.  Can turn your stool black, this is normal.  If you have constipation or stomach issues with it, take it 3 days a week.   Iron Deficiency Anemia, Adult Anemia is a condition in which there are less red blood cells or hemoglobin in the blood than normal. Hemoglobin is the part of red blood cells that carries oxygen. Iron deficiency anemia is anemia caused by too little iron. It is the most common type of anemia. It may leave you tired and short of breath. CAUSES   Lack of iron in the diet.  Poor absorption of iron, as seen with intestinal disorders.  Intestinal bleeding.  Heavy periods. SIGNS AND SYMPTOMS  Mild anemia may not be noticeable. Symptoms may include:  Fatigue.  Headache.  Pale skin.  Weakness.  Tiredness.  Shortness of breath.  Dizziness.  Cold hands and feet.  Fast or irregular heartbeat. DIAGNOSIS  Diagnosis requires a thorough evaluation and physical exam by your health care provider. Blood tests are generally used to confirm iron deficiency anemia. Additional tests may be done to find the underlying cause of your anemia. These may include:  Testing for blood in the stool (fecal occult blood test).  A procedure to see inside the colon and rectum (colonoscopy).  A procedure to see inside the esophagus and stomach (endoscopy). TREATMENT  Iron deficiency anemia is treated by correcting the cause of the deficiency. Treatment may involve:  Adding iron-rich foods to your diet.  Taking iron supplements. Pregnant or breastfeeding women need to take extra iron because their normal diet usually does not provide the required amount.  Taking vitamins. Vitamin C improves the absorption of iron. Your health care provider may recommend that you take your iron tablets with a glass of orange juice or vitamin C supplement.  Medicines to make heavy menstrual flow  lighter.  Surgery. HOME CARE INSTRUCTIONS   Take iron as directed by your health care provider.  If you cannot tolerate taking iron supplements by mouth, talk to your health care provider about taking them through a vein (intravenously) or an injection into a muscle.  For the best iron absorption, iron supplements should be taken on an empty stomach. If you cannot tolerate them on an empty stomach, you may need to take them with food.  Do not drink milk or take antacids at the same time as your iron supplements. Milk and antacids may interfere with the absorption of iron.  Iron supplements can cause constipation. Make sure to include fiber in your diet to prevent constipation. A stool softener may also be recommended.  Take vitamins as directed by your health care provider.  Eat a diet rich in iron. Foods high in iron include liver, lean beef, whole-grain bread, eggs, dried fruit, and dark green leafy vegetables. SEEK IMMEDIATE MEDICAL CARE IF:   You faint. If this happens, do not drive. Call your local emergency services (911 in [place] if no other help is available.  You have chest pain.  You feel nauseous or vomit.  You have severe or increased shortness of breath with activity.  You feel weak.  You have a rapid heartbeat.  You have unexplained sweating.  You become light-headed when getting up from a chair or bed. MAKE SURE YOU:   Understand these instructions.  Will watch your condition.  Will get help right away if you  are not doing well or get worse.   This information is not intended to replace advice given to you by your health care provider. Make sure you discuss any questions you have with your health care provider.   Document Released: 11/07/2000 Document Revised: 12/01/2014 Document Reviewed: 07/18/2013 Elsevier Interactive Patient Education [employer].

## 2015-10-10 NOTE — Progress Notes (Signed)
Assessment and Plan: Iron def anemia- monitor cologuard results, have received it. Could have been due to recent increase in INR that is now normal, denies symptoms of anemia like SOB, dizziness. Start on iron + vitamin C daily for 2 weeks then go to every other day.    HPI 74 y.o.male presents for 1 week follow up. Last visit he had a drop in his H/H. Denies dark black stool, blood in his stool, GERD symptoms, SOB, fatigue, dizziness.   He is on coumadin and recently had recent ABX with increase in his INR but it is now back to normal, low dose ASA, on MF, not on PPI, last colonoscopy was 2011, did cologuard and mailed off Monday.   Lab Results  Component Value Date   WBC 3.7* 09/27/2015   HGB 9.7* 09/27/2015   HCT 32.2* 09/27/2015   MCV 79.5 09/27/2015   PLT 157 09/27/2015   Lab Results  Component Value Date   IRON 25* 09/27/2015   TIBC 407 09/27/2015   Lab Results  Component Value Date   GFRNONAA 84 09/10/2015    Past Medical History  Diagnosis Date  . Hypertension   . GERD (gastroesophageal reflux disease)   . Cancer [facility])     prostate  . Anemia   . Type II or unspecified type diabetes mellitus without mention of complication, not stated as uncontrolled   . Hyperlipidemia   . Umbilical hernia   . AS (aortic stenosis)     valve replacement-st. jude mechanical valve  . CAD (coronary artery disease), native coronary artery     CABG 07/18/2003  . History of nuclear stress test 02/12/10    EF 67%, normal perfusion  . ASHD (arteriosclerotic heart disease)   . Vitamin D deficiency   . Vitamin B12 deficiency   . Morbid obesity ([place])      No Known Allergies    Current Outpatient Prescriptions on File Prior to Visit  Medication Sig Dispense Refill  . aspirin 81 MG tablet Take 81 mg by mouth daily.      . clotrimazole-betamethasone (LOTRISONE) cream Apply 1 application topically 2 (two) times daily. 15 g 2  . cyanocobalamin (,VITAMIN B-12,) 1000 MCG/ML injection INJECT  1/2 ML EVERY MONTH 10 mL 1  . enalapril (VASOTEC) 20 MG tablet Take 2 tablets (40 mg total) by mouth daily. 180 tablet 3  . FARXIGA 10 MG TABS tablet TAKE 1 TABLET BY MOUTH EVERY DAY FOR DIABETES 30 tablet 3  . glipiZIDE (GLUCOTROL) 10 MG tablet Take 1 tablet (10 mg total) by mouth 3 (three) times daily before meals. 270 tablet 3  . hydrochlorothiazide (HYDRODIURIL) 25 MG tablet TAKE 1 TABLET  EVERY DAY 90 tablet 0  . metFORMIN (GLUCOPHAGE-XR) 500 MG 24 hr tablet TAKE 2 TABLETS TWICE DAILY WITH A MEAL 360 tablet 1  . metoprolol (LOPRESSOR) 50 MG tablet Take 1 tablet (50 mg total) by mouth daily. 90 tablet 3  . nystatin (MYCOSTATIN/NYSTOP) 100000 UNIT/GM POWD APPLY TO AFFECTED AREA TWICE A DAY 60 g 99  . ranitidine (ZANTAC) 300 MG tablet Take 1 to 2 tablets daily for heartburn to allow wean off of Prilosec/Omeprazole 180 tablet 99  . simvastatin (ZOCOR) 40 MG tablet TAKE 1 TABLET  EVERY DAY 90 tablet 3  . warfarin (COUMADIN) 5 MG tablet Take 1 tablet by mouth daily or as directed 90 tablet 1   No current facility-administered medications on file prior to visit.    ROS: all negative except above.  Physical Exam: Filed Weights   10/10/15 1036  Weight: 229 lb (103.874 kg)   BP 128/70 mmHg  Pulse 67  Temp(Src) 97.9 F (36.6 C) (Temporal)  Resp 14  Ht 5' 9.5" (1.765 m)  Wt 229 lb (103.874 kg)  BMI 33.34 kg/m2  SpO2 98% General appearance: alert, no distress, WD/WN, male HEENT: normocephalic, sclerae anicteric, TMs pearly, nares patent, no discharge or erythema, pharynx normal Oral cavity: MMM, no lesions Neck: supple, no lymphadenopathy, no thyromegaly, no masses Heart: RRR, normal S1, S2, with PVCs with artifical click best heart LSB no murmurs Lungs: CTA bilaterally, no wheezes, rhonchi, or rales Abdomen: +bs, soft, obese non tender, non distended, no masses, no hepatomegaly, no splenomegaly Musculoskeletal: nontender, no swelling, no obvious deformity Extremities: no edema, no  cyanosis, no clubbing Pulses: 2+ symmetric, upper and lower extremities, normal cap refill Neurological: alert, oriented x 3, CN2-12 intact, strength normal upper extremities and lower extremities, sensation normal throughout, DTRs 2+ throughout, no cerebellar signs, gait normal Psychiatric: normal affect, behavior normal, pleasant     Vicie Mutters, PA-C 11:25 AM [facility]

## 2015-10-17 LAB — COLOGUARD

## 2015-10-22 ENCOUNTER — Other Ambulatory Visit: Payer: Self-pay | Admitting: Physician Assistant

## 2015-10-22 ENCOUNTER — Other Ambulatory Visit: Payer: Self-pay | Admitting: Internal Medicine

## 2015-10-24 ENCOUNTER — Other Ambulatory Visit: Payer: Self-pay | Admitting: Internal Medicine

## 2015-11-01 ENCOUNTER — Other Ambulatory Visit: Payer: Self-pay

## 2015-11-01 MED ORDER — GLIPIZIDE 10 MG PO TABS: 10.0000 mg | ORAL_TABLET | Freq: Three times a day (TID) | ORAL | Status: DC

## 2015-11-14 ENCOUNTER — Ambulatory Visit (INDEPENDENT_AMBULATORY_CARE_PROVIDER_SITE_OTHER): Payer: PPO | Admitting: Pharmacist Clinician (PhC)/ Clinical Pharmacy Specialist

## 2015-11-14 DIAGNOSIS — Z954 Presence of other heart-valve replacement: Secondary | ICD-10-CM

## 2015-11-14 DIAGNOSIS — Z7901 Long term (current) use of anticoagulants: Secondary | ICD-10-CM

## 2015-11-14 DIAGNOSIS — Z952 Presence of prosthetic heart valve: Secondary | ICD-10-CM

## 2015-11-14 LAB — POCT INR: INR: 2.3

## 2015-12-15 ENCOUNTER — Encounter: Payer: Self-pay | Admitting: *Deleted

## 2015-12-18 ENCOUNTER — Ambulatory Visit (INDEPENDENT_AMBULATORY_CARE_PROVIDER_SITE_OTHER): Payer: PPO | Admitting: Internal Medicine

## 2015-12-18 ENCOUNTER — Encounter: Payer: Self-pay | Admitting: Internal Medicine

## 2015-12-18 VITALS — BP 132/66 | HR 88 | Temp 98.2°F | Resp 18 | Ht 69.5 in | Wt 225.0 lb

## 2015-12-18 DIAGNOSIS — Z951 Presence of aortocoronary bypass graft: Secondary | ICD-10-CM

## 2015-12-18 DIAGNOSIS — Z0001 Encounter for general adult medical examination with abnormal findings: Secondary | ICD-10-CM

## 2015-12-18 DIAGNOSIS — Z23 Encounter for immunization: Secondary | ICD-10-CM

## 2015-12-18 DIAGNOSIS — E538 Deficiency of other specified B group vitamins: Secondary | ICD-10-CM | POA: Diagnosis not present

## 2015-12-18 DIAGNOSIS — E559 Vitamin D deficiency, unspecified: Secondary | ICD-10-CM

## 2015-12-18 DIAGNOSIS — E1121 Type 2 diabetes mellitus with diabetic nephropathy: Secondary | ICD-10-CM | POA: Diagnosis not present

## 2015-12-18 DIAGNOSIS — Z952 Presence of prosthetic heart valve: Secondary | ICD-10-CM

## 2015-12-18 DIAGNOSIS — I2510 Atherosclerotic heart disease of native coronary artery without angina pectoris: Secondary | ICD-10-CM | POA: Diagnosis not present

## 2015-12-18 DIAGNOSIS — I10 Essential (primary) hypertension: Secondary | ICD-10-CM

## 2015-12-18 DIAGNOSIS — K429 Umbilical hernia without obstruction or gangrene: Secondary | ICD-10-CM

## 2015-12-18 DIAGNOSIS — Z0000 Encounter for general adult medical examination without abnormal findings: Secondary | ICD-10-CM

## 2015-12-18 DIAGNOSIS — I2583 Coronary atherosclerosis due to lipid rich plaque: Secondary | ICD-10-CM

## 2015-12-18 DIAGNOSIS — Z029 Encounter for administrative examinations, unspecified: Secondary | ICD-10-CM

## 2015-12-18 DIAGNOSIS — Z954 Presence of other heart-valve replacement: Secondary | ICD-10-CM | POA: Diagnosis not present

## 2015-12-18 DIAGNOSIS — E785 Hyperlipidemia, unspecified: Secondary | ICD-10-CM | POA: Diagnosis not present

## 2015-12-18 DIAGNOSIS — Z79899 Other long term (current) drug therapy: Secondary | ICD-10-CM | POA: Diagnosis not present

## 2015-12-18 DIAGNOSIS — Z6831 Body mass index (BMI) 31.0-31.9, adult: Secondary | ICD-10-CM

## 2015-12-18 DIAGNOSIS — Z024 Encounter for examination for driving license: Secondary | ICD-10-CM

## 2015-12-18 DIAGNOSIS — R6889 Other general symptoms and signs: Secondary | ICD-10-CM

## 2015-12-18 DIAGNOSIS — E1129 Type 2 diabetes mellitus with other diabetic kidney complication: Secondary | ICD-10-CM | POA: Diagnosis not present

## 2015-12-18 LAB — BASIC METABOLIC PANEL WITHOUT GFR
BUN: 10 mg/dL (ref 7–25)
CO2: 26 mmol/L (ref 20–31)
Calcium: 9.1 mg/dL (ref 8.6–10.3)
Chloride: 105 mmol/L (ref 98–110)
Creat: 0.8 mg/dL (ref 0.70–1.18)
GFR, Est African American: >89 mL/min
GFR, Est Non African American: 89 mL/min
Glucose, Bld: 134 mg/dL — ABNORMAL HIGH (ref 65–99)
Potassium: 4.7 mmol/L (ref 3.5–5.3)
Sodium: 139 mmol/L (ref 135–146)

## 2015-12-18 LAB — HEPATIC FUNCTION PANEL
ALT: 16 U/L (ref 9–46)
AST: 16 U/L (ref 10–35)
Albumin: 4.2 g/dL (ref 3.6–5.1)
Alkaline Phosphatase: 67 U/L (ref 40–115)
Bilirubin, Direct: 0.2 mg/dL
Indirect Bilirubin: 0.5 mg/dL (ref 0.2–1.2)
Total Bilirubin: 0.7 mg/dL (ref 0.2–1.2)
Total Protein: 6.4 g/dL (ref 6.1–8.1)

## 2015-12-18 LAB — LIPID PANEL
Cholesterol: 125 mg/dL (ref 125–200)
HDL: 34 mg/dL — ABNORMAL LOW
LDL Cholesterol: 71 mg/dL
Total CHOL/HDL Ratio: 3.7 ratio
Triglycerides: 99 mg/dL
VLDL: 20 mg/dL

## 2015-12-18 LAB — TSH: TSH: 1.658 u[IU]/mL (ref 0.350–4.500)

## 2015-12-18 NOTE — Progress Notes (Signed)
Patient ID: Billy Reilly, male   DOB: [birth year], 75 y.o.   MRN: [id number]  MEDICARE ANNUAL WELLNESS VISIT AND FOLLOW UP Assessment:    1. Essential hypertension -well controlled -cont monitoring -DASH diet -call office if weakness, chest pain SOB - TSH  2. Type II diabetes mellitus with nephropathy (HCC) -cont meds -does not appear to be at goal -diet and exercise - Hemoglobin A1c  3. Hyperlipidemia -cont meds - Lipid panel  4. Vitamin D deficiency -cont supplement  5. Medication management  - CBC with Differential/Platelet - BASIC METABOLIC PANEL WITH GFR - Hepatic function panel  6. Coronary artery disease due to lipid rich plaque -followed by cards -cholesterol at goal  7. Vitamin B12 deficiency -cont Vit B complex  8. Umbilical hernia, recurrence not specified -non painful, non-tender  9. S/P AVR -followed by cards -on coumadin  10. Morbid obesity, unspecified obesity type ([place]) -recommended diet and exercise  11. Hx of CABG -followed by cards  12. BMI 31.94,adult -diet and exercise  13. Encounter for Department of Transportation (DOT) examination for driving license renewal -will create a letter  14. Encounter for Medicare annual wellness exam -due next year  72. Need for prophylactic vaccination against Streptococcus pneumoniae (pneumococcus)  - Pneumococcal conjugate vaccine 13-valent     Over 30 minutes of exam, counseling, chart review, and critical decision making was performed  Plan:   During the course of the visit the patient was educated and counseled about appropriate screening and preventive services including:    Pneumococcal vaccine   Influenza vaccine  Prevnar 13  Td vaccine  Screening electrocardiogram  Colorectal cancer screening  Diabetes screening  Glaucoma screening  Nutrition counseling   Conditions/risks identified: BMI: Discussed weight loss, diet, and increase physical activity.  Increase  physical activity: AHA recommends 150 minutes of physical activity a week.  Medications reviewed Diabetes is not at goal, ACE/ARB therapy: Yes. Urinary Incontinence is not an issue: discussed non pharmacology and pharmacology options.  Fall risk: low- discussed PT, home fall assessment, medications.    Subjective:  Billy Reilly is a 75 y.o. male who presents for Medicare Annual Wellness Visit and 3 month follow up for HTN, hyperlipidemia, diabetes type 2, and vitamin D Def.  Date of last medicare wellness visit was is unknown.  His blood pressure has been controlled at home, today their BP is BP: 132/66 mmHg He does not workout. He denies chest pain, shortness of breath, dizziness.  He is on cholesterol medication and denies myalgias. His cholesterol is at goal. The cholesterol last visit was:   Lab Results  Component Value Date   CHOL 94* 09/10/2015   HDL 25* 09/10/2015   LDLCALC 42 09/10/2015   TRIG 134 09/10/2015   CHOLHDL 3.8 09/10/2015   He has been working on diet and exercise for diabetes, and denies foot ulcerations, hyperglycemia, hypoglycemia , increased appetite, nausea, paresthesia of the feet, polydipsia, polyuria, visual disturbances, vomiting and weight loss. Last A1C in the office was:  Lab Results  Component Value Date   HGBA1C 7.4* 09/10/2015    He recently had vision checked in November.    Last GFR NonAA   Lab Results  Component Value Date   [facility] 84 09/10/2015   AA  Lab Results  Component Value Date   GFRAA >89 09/10/2015   Patient is on Vitamin D supplement.   Lab Results  Component Value Date   VD25OH 41 09/10/2015      Medication Review:  Current Outpatient Prescriptions on File Prior to Visit  Medication Sig Dispense Refill  . aspirin 81 MG tablet Take 81 mg by mouth daily.      . clotrimazole-betamethasone (LOTRISONE) cream APPLY TO AFFECTED AREA TWICE A DAY FOR 2 WEEKS 45 g 0  . cyanocobalamin (,VITAMIN B-12,) 1000 MCG/ML injection  INJECT 1/2 ML EVERY MONTH 10 mL 1  . enalapril (VASOTEC) 20 MG tablet Take 2 tablets (40 mg total) by mouth daily. 180 tablet 3  . FARXIGA 10 MG TABS tablet TAKE 1 TABLET BY MOUTH EVERY DAY FOR DIABETES 30 tablet 3  . glipiZIDE (GLUCOTROL) 10 MG tablet Take 1 tablet (10 mg total) by mouth 3 (three) times daily before meals. 270 tablet 3  . hydrochlorothiazide (HYDRODIURIL) 25 MG tablet TAKE 1 TABLET  EVERY DAY 90 tablet 0  . metFORMIN (GLUCOPHAGE-XR) 500 MG 24 hr tablet TAKE 2 TABLETS TWICE DAILY WITH A MEAL 360 tablet 1  . metoprolol (LOPRESSOR) 50 MG tablet Take 1 tablet (50 mg total) by mouth daily. 90 tablet 3  . nystatin (MYCOSTATIN/NYSTOP) 100000 UNIT/GM POWD APPLY TO AFFECTED AREA TWICE A DAY 60 g 0  . ranitidine (ZANTAC) 300 MG tablet Take 1 to 2 tablets daily for heartburn to allow wean off of Prilosec/Omeprazole 180 tablet 99  . simvastatin (ZOCOR) 40 MG tablet TAKE 1 TABLET  EVERY DAY 90 tablet 3  . warfarin (COUMADIN) 5 MG tablet Take 1 tablet by mouth daily or as directed 90 tablet 1   No current facility-administered medications on file prior to visit.    Current Problems (verified) Patient Active Problem List   Diagnosis Date Noted  . BMI 31.94,adult 09/10/2015  . Encounter for Medicare annual wellness exam 06/06/2015  . Encounter for Department of Transportation (DOT) examination for driving license renewal [id number]  . Hx of CABG 01/24/2015  . CAD (coronary artery disease) 12/08/2014  . Medication management 02/28/2014  . Essential hypertension 12/13/2013  . Hyperlipidemia   . Type II diabetes mellitus with nephropathy ([place])   . Vitamin D deficiency   . Vitamin B12 deficiency   . Morbid obesity ([place])   . Long term current use of anticoagulant therapy 02/08/2013  . S/P AVR 02/08/2013  . Umbilical hernia [id number]    Screening Tests Immunization History  Administered Date(s) Administered  . Influenza Whole 09/01/2012  . Influenza, High Dose Seasonal PF  09/27/2015  . Pneumococcal Polysaccharide-23 09/01/2012  . Tdap 11/25/2007    Preventative care: Last colonoscopy: 09/2015  Prior vaccinations: TD or Tdap: 11/2007  Influenza: 8/16  Pneumococcal: 2013 Prevnar13: Given today Shingles/Zostavax: Declined today  Names of Other Physician/Practitioners you currently use: 1. [facility] here for primary care 2. Dr. Delman Cheadle, eye doctor, last visit 2016 3. Dr. Johnnye Sima, dentist, last visit 2016, once yearly Patient Care Team: Unk Pinto, MD as PCP - General (Internal Medicine) [name], MD as Consulting Physician (Cardiology) Irine Seal, MD as Attending Physician (Urology) Laurence Spates, MD as Consulting Physician (Gastroenterology)  Past Surgical History  Procedure Laterality Date  . Aortic valve replacement  07/18/2003    43mm ATS-AP valve  . Hernia repair  [id number]    RIH, umbilical  . Aortic valve replacement (avr)/coronary artery bypass grafting (cabg)  07/18/2003    AVR; bypass x 2-SVG to first diag, SVG to posterior descending  . Cardiac catheterization  07/13/2003    AS-aortic valve gradient 109mmhg, valve area 1.1; obstruction of ostium of posterior lateral branch with obstruction  in the ostium of first diagnonal  . Tee without cardioversion  07/18/2003    no evidence of PE, Aortic valve heavily calcified,   . Transthoracic echocardiogram  02/21/2013    EF60-65%, Septal motion showed paradox. atrium moderately dilated  . Nm myocar perf wall motion  02/12/2010    protocol:Bruce, EF 67%, exercise cap 7 METS, low risk scan   Family History  Problem Relation Age of Onset  . Stroke Mother   . Heart attack Father   . Heart disease Father   . Cancer Brother   . Cancer Sister    Social History  Substance Use Topics  . Smoking status: Never Smoker   . Smokeless tobacco: Never Used  . Alcohol Use: No    MEDICARE WELLNESS OBJECTIVES: Tobacco use: He does not smoke.  Patient  is not a former smoker. If yes, counseling given Alcohol Current alcohol use: none, quit in 2004 Osteoporosis: hypogonadism, History of fracture in the past year: no Fall risk: Minimal risk Hearing: normal Visual acuity: normal,  does perform annual eye exam Diet: well balanced Physical activity:   Cardiac risk factors:   Depression/mood screen:   Depression screen PHQ 2/9 09/10/2015  Decreased Interest 0  Down, Depressed, Hopeless 0  PHQ - 2 Score 0    ADLs:  In your present state of health, do you have any difficulty performing the following activities: 09/10/2015 06/08/2015  Hearing? N N  Vision? N N  Difficulty concentrating or making decisions? N N  Walking or climbing stairs? N N  Dressing or bathing? N N  Doing errands, shopping? N N  Preparing Food and eating ? - N  Using the Toilet? - N  In the past six months, have you accidently leaked urine? - N  Do you have problems with loss of bowel control? - N  Managing your Medications? - N  Managing your Finances? - N  Housekeeping or managing your Housekeeping? - N     Cognitive Testing  Alert? Yes  Normal Appearance?Yes  Oriented to person? Yes  Place? Yes   Time? Yes  Recall of three objects?  Yes  Can perform simple calculations? Yes  Displays appropriate judgment?Yes  Can read the correct time from a watch face?Yes  EOL planning:     Objective:   Today's Vitals   12/18/15 1001  BP: 132/66  Pulse: 88  Temp: 98.2 F (36.8 C)  TempSrc: Temporal  Resp: 18  Height: 5' 9.5" (1.765 m)  Weight: 225 lb (102.059 kg)   Wt Readings from Last 3 Encounters:  12/18/15 225 lb (102.059 kg)  10/10/15 229 lb (103.874 kg)  09/10/15 219 lb 6.4 oz (99.519 kg)      Body mass index is 32.76 kg/(m^2).  General appearance: alert, no distress, WD/WN, male HEENT: normocephalic, sclerae anicteric, TMs pearly, nares patent, no discharge or erythema, pharynx normal Oral cavity: MMM, no lesions Neck: supple, no  lymphadenopathy, no thyromegaly, no masses Heart: RRR, Clicking with artificial valve Lungs: CTA bilaterally, no wheezes, rhonchi, or rales Abdomen: +bs, soft, non tender, non distended, no masses, no hepatomegaly, no splenomegaly Musculoskeletal: nontender, no swelling, no obvious deformity Extremities: no edema, no cyanosis, no clubbing Pulses: 2+ symmetric, upper and lower extremities, normal cap refill Neurological: alert, oriented x 3, CN2-12 intact, strength normal upper extremities and lower extremities, sensation normal throughout, DTRs 2+ throughout, no cerebellar signs, gait normal Psychiatric: normal affect, behavior normal, pleasant   Medicare Attestation I have personally reviewed: The patient's  medical and social history Their use of alcohol, tobacco or illicit drugs Their current medications and supplements The patient's functional ability including ADLs,fall risks, home safety risks, cognitive, and hearing and visual impairment Diet and physical activities Evidence for depression or mood disorders  The patient's weight, height, BMI, and visual acuity have been recorded in the chart.  I have made referrals, counseling, and provided education to the patient based on review of the above and I have provided the patient with a written personalized care plan for preventive services.     Starlyn Skeans, PA-C   12/18/2015

## 2015-12-19 ENCOUNTER — Ambulatory Visit (INDEPENDENT_AMBULATORY_CARE_PROVIDER_SITE_OTHER): Payer: PPO | Admitting: Pharmacist Clinician (PhC)/ Clinical Pharmacy Specialist

## 2015-12-19 DIAGNOSIS — Z954 Presence of other heart-valve replacement: Secondary | ICD-10-CM | POA: Diagnosis not present

## 2015-12-19 DIAGNOSIS — Z7901 Long term (current) use of anticoagulants: Secondary | ICD-10-CM | POA: Diagnosis not present

## 2015-12-19 DIAGNOSIS — Z952 Presence of prosthetic heart valve: Secondary | ICD-10-CM

## 2015-12-19 LAB — CBC WITH DIFFERENTIAL/PLATELET
Basophils Absolute: 0 10*3/uL (ref 0.0–0.1)
Basophils Relative: 0 % (ref 0–1)
Eosinophils Absolute: 0.1 10*3/uL (ref 0.0–0.7)
Eosinophils Relative: 2 % (ref 0–5)
HCT: 43.6 % (ref 39.0–52.0)
Hemoglobin: 14 g/dL (ref 13.0–17.0)
Lymphocytes Relative: 17 % (ref 12–46)
Lymphs Abs: 1.2 10*3/uL (ref 0.7–4.0)
MCH: 26.1 pg (ref 26.0–34.0)
MCHC: 32.1 g/dL (ref 30.0–36.0)
MCV: 81.3 fL (ref 78.0–100.0)
Monocytes Absolute: 0.6 10*3/uL (ref 0.1–1.0)
Monocytes Relative: 9 % (ref 3–12)
Neutro Abs: 5.1 10*3/uL (ref 1.7–7.7)
Neutrophils Relative %: 72 % (ref 43–77)
Platelets: 166 10*3/uL (ref 150–400)
RBC: 5.36 MIL/uL (ref 4.22–5.81)
RDW: 21.8 % — ABNORMAL HIGH (ref 11.5–15.5)
WBC: 7.1 10*3/uL (ref 4.0–10.5)

## 2015-12-19 LAB — HEMOGLOBIN A1C
Hgb A1c MFr Bld: 7 % — ABNORMAL HIGH
Mean Plasma Glucose: 154 mg/dL — ABNORMAL HIGH

## 2015-12-19 LAB — POCT INR: INR: 2.3

## 2015-12-21 ENCOUNTER — Other Ambulatory Visit: Payer: Self-pay | Admitting: Internal Medicine

## 2015-12-26 ENCOUNTER — Encounter: Payer: PPO | Admitting: Pharmacist Clinician (PhC)/ Clinical Pharmacy Specialist

## 2016-01-11 ENCOUNTER — Encounter: Payer: Self-pay | Admitting: *Deleted

## 2016-01-21 ENCOUNTER — Other Ambulatory Visit: Payer: Self-pay | Admitting: *Deleted

## 2016-01-21 MED ORDER — DAPAGLIFLOZIN PROPANEDIOL 10 MG PO TABS: ORAL_TABLET | ORAL | Status: DC

## 2016-01-23 ENCOUNTER — Ambulatory Visit (INDEPENDENT_AMBULATORY_CARE_PROVIDER_SITE_OTHER): Payer: PPO | Admitting: Pharmacist Clinician (PhC)/ Clinical Pharmacy Specialist

## 2016-01-23 ENCOUNTER — Encounter: Payer: Self-pay | Admitting: Internal Medicine

## 2016-01-23 ENCOUNTER — Ambulatory Visit (INDEPENDENT_AMBULATORY_CARE_PROVIDER_SITE_OTHER): Payer: PPO | Admitting: Internal Medicine

## 2016-01-23 VITALS — BP 132/62 | HR 64 | Ht 69.0 in | Wt 221.1 lb

## 2016-01-23 DIAGNOSIS — Z0000 Encounter for general adult medical examination without abnormal findings: Secondary | ICD-10-CM

## 2016-01-23 DIAGNOSIS — Z951 Presence of aortocoronary bypass graft: Secondary | ICD-10-CM

## 2016-01-23 DIAGNOSIS — Z952 Presence of prosthetic heart valve: Secondary | ICD-10-CM

## 2016-01-23 DIAGNOSIS — Z7901 Long term (current) use of anticoagulants: Secondary | ICD-10-CM

## 2016-01-23 DIAGNOSIS — Z954 Presence of other heart-valve replacement: Secondary | ICD-10-CM

## 2016-01-23 DIAGNOSIS — I10 Essential (primary) hypertension: Secondary | ICD-10-CM

## 2016-01-23 LAB — POCT INR: INR: 2.3

## 2016-01-23 NOTE — Patient Instructions (Signed)
Your physician has requested that you have an echocardiogram @ [address] - [address]rd [address]. Echocardiography is a painless test that uses sound waves to create images of your heart. It provides your doctor with information about the size and shape of your heart and how well your heart's chambers and valves are working. This procedure takes approximately one hour. There are no restrictions for this procedure.  Your physician wants you to follow-up in: 1 year with Dr. Debara Pickett. You will receive a reminder letter in the mail two months in advance. If you don't receive a letter, please call our office to schedule the follow-up appointment.

## 2016-01-24 NOTE — Progress Notes (Signed)
OFFICE NOTE  Chief Complaint:  No complaints  Primary Care Physician: Alesia Richards, MD  HPI:  Billy Reilly is a 75 year old gentleman, previously followed by Dr. Rex Kras, with a history of coronary disease. In 2004, he had CABG and replacement of his aortic valve with a mechanical prosthesis. He had 2-vessel bypass at the time with saphenous vein graft to the first diagonal and saphenous vein graft to the posterior descending by Dr. Prescott Gum, and replacement of the aortic valve with a 22 mm ATS AP valve. He has done well with valve replacement to this point without any recurrent angina, shortness of breath, palpitations, presyncope, or syncopal symptoms. INR checked today in the office is 2.6 and it has been stable on Coumadin without dose adjustments for at least a year. 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 Recently he was started on Farxiga, which is resulted in excellent weight loss as well as control of his blood sugars.  Overall he feels quite well.  I saw Billy Reilly back today in the office. He is here for DOT visit. When I last saw him he was doing well denies any chest pain or shortness of breath. That holds true today. His warfarin levels have been well controlled his INR was 3.1 today. He has not had stress testing in over 5 years and his bypass was in 2004. 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  Billy Reilly returns today for follow-up. I last saw him 6 months ago for DOT physical. At that time we performed a nuclear stress test which was negative for ischemia. 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 He denies any chest pain or worsening shortness of breath. Blood pressures been well controlled.  I saw Billy Reilly back today in the office for follow-up. He returns for a DOT letter. He continues to do well after bypass surgery  mechanical valve replacement. His INRs have been therapeutic. He is completely asymptomatic. 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 He had a stress test last year which was negative for ischemia. He is due for repeat echocardiogram to reassess his valve gradients this year.  PMHx:  Past Medical History  Diagnosis Date  . Hypertension   . GERD (gastroesophageal reflux disease)   . 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 Anemia   . Type II or unspecified type diabetes mellitus without mention of complication, not stated as uncontrolled   . Hyperlipidemia   . Umbilical hernia   . AS (aortic stenosis)     valve replacement-st. jude mechanical valve  . CAD (coronary artery disease), native coronary artery     CABG 07/18/2003  . History of nuclear stress test 02/12/10    EF 67%, normal perfusion  . ASHD (arteriosclerotic heart disease)   . Vitamin D deficiency   . Vitamin B12 deficiency   . Morbid obesity [facility])     Past Surgical History  Procedure Laterality Date  . Aortic valve replacement  07/18/2003    7mm ATS-AP valve  . Hernia repair  [id number]    RIH, umbilical  . Aortic valve replacement (avr)/coronary artery bypass grafting (cabg)  07/18/2003    AVR; bypass x 2-SVG to first diag, SVG to posterior descending  . Cardiac catheterization  07/13/2003    AS-aortic valve gradient 49mmhg, valve area 1.1; obstruction of ostium of posterior lateral branch with obstruction in the ostium of first diagnonal  . Tee without cardioversion  07/18/2003    no evidence of PE, Aortic valve heavily calcified,   . Transthoracic echocardiogram  02/21/2013    EF60-65%, Septal motion showed paradox. atrium moderately dilated  . Nm myocar perf wall motion  02/12/2010    protocol:Bruce, EF 67%, exercise cap 7 METS, low risk scan    FAMHx:  Family History  Problem Relation Age of Onset  . Stroke Mother   . Heart attack Father   . Heart  disease Father   . Cancer Brother   . Cancer Sister     SOCHx:   reports that he has never smoked. He has never used smokeless tobacco. He reports that he does not drink alcohol or use illicit drugs.  ALLERGIES:  No Known Allergies  ROS: A comprehensive review of systems was negative.  HOME MEDS: Current Outpatient Prescriptions  Medication Sig Dispense Refill  . aspirin 81 MG tablet Take 81 mg by mouth daily.      . clotrimazole-betamethasone (LOTRISONE) cream APPLY TO AFFECTED AREA TWICE A DAY FOR 2 WEEKS 45 g 0  . cyanocobalamin (,VITAMIN B-12,) 1000 MCG/ML injection INJECT 1/2 ML EVERY MONTH 10 mL 1  . dapagliflozin propanediol (FARXIGA) 10 MG TABS tablet TAKE 1 TABLET BY MOUTH EVERY DAY FOR DIABETES 30 tablet 3  . enalapril (VASOTEC) 20 MG tablet Take 2 tablets (40 mg total) by mouth daily. 180 tablet 3  . glipiZIDE (GLUCOTROL) 10 MG tablet Take 1 tablet (10 mg total) by mouth 3 (three) times daily before meals. 270 tablet 3  . hydrochlorothiazide (HYDRODIURIL) 25 MG tablet TAKE 1 TABLET  EVERY DAY 90 tablet 0  . metFORMIN (GLUCOPHAGE-XR) 500 MG 24 hr tablet TAKE 2 TABLETS TWICE DAILY WITH A MEAL 360 tablet 1  . metoprolol succinate (TOPROL-XL) 50 MG 24 hr tablet Take 1 tablet by mouth daily.    Marland Kitchen nystatin (MYCOSTATIN/NYSTOP) 100000 UNIT/GM POWD APPLY TO AFFECTED AREA TWICE A DAY 60 g 0  . ranitidine (ZANTAC) 300 MG tablet Take 1 to 2 tablets daily for heartburn to allow wean off of Prilosec/Omeprazole 180 tablet 99  . simvastatin (ZOCOR) 40 MG tablet TAKE 1 TABLET  EVERY DAY 90 tablet 3  . warfarin (COUMADIN) 5 MG tablet Take 1 tablet by mouth daily or as directed 90 tablet 1   No current facility-administered medications for this visit.    LABS/IMAGING: No results found for this or any previous visit (from the past 48 hour(s)). No results found.  VITALS: BP 132/62 mmHg  Pulse 64  Ht 5\' 9"  (1.753 m)  Wt 221 lb 1.6 oz (100.29 kg)  BMI 32.64 kg/m2  EXAM: General  appearance: alert and no distress Neck: no carotid bruit, no JVD and thyroid not enlarged, symmetric, no tenderness/mass/nodules Lungs: clear to auscultation bilaterally Heart: regular rate and rhythm, S1, S2 normal, no murmur, click, rub or gallop Abdomen: soft, non-tender; bowel sounds normal; no masses,  no organomegaly Extremities: extremities normal, atraumatic, no cyanosis or edema Pulses: 2+ and symmetric Skin: Skin color, texture, turgor normal. No rashes or lesions Neurologic: Grossly normal Psych: Mood, affect normal  EKG: Normal sinus rhythm at 64   ASSESSMENT: 1. CAD status post 2 vessel CABG (SVG to diagonal SVG to PDA) in 2004 2. Status post ATS mechanical aortic valve 3. Lifetime anticoagulation on warfarin 4.  HTN 5. Dyslipidemia 6. DM2  PLAN: 1.   Billy Reilly is doing well without any new cardiac problems. INR is therapeutic on warfarin. Blood pressure is well-controlled. His cholesterol was recently assessed and is at goal.  his stress test last year was negative for ischemia with normal LV function. He is due for repeat echocardiogram to reassess his aortic valve gradient.  Plan to see him back in 6 months.  [name], MD, [facility] Attending Cardiologist [place] 01/24/2016, 7:37 PM

## 2016-02-10 ENCOUNTER — Other Ambulatory Visit: Payer: Self-pay | Admitting: Internal Medicine

## 2016-02-12 ENCOUNTER — Ambulatory Visit (HOSPITAL_COMMUNITY): Payer: PPO | Attending: Internal Medicine

## 2016-02-12 ENCOUNTER — Other Ambulatory Visit: Payer: Self-pay

## 2016-02-12 DIAGNOSIS — E119 Type 2 diabetes mellitus without complications: Secondary | ICD-10-CM | POA: Diagnosis not present

## 2016-02-12 DIAGNOSIS — I359 Nonrheumatic aortic valve disorder, unspecified: Secondary | ICD-10-CM | POA: Diagnosis present

## 2016-02-12 DIAGNOSIS — I119 Hypertensive heart disease without heart failure: Secondary | ICD-10-CM | POA: Insufficient documentation

## 2016-02-12 DIAGNOSIS — Z951 Presence of aortocoronary bypass graft: Secondary | ICD-10-CM | POA: Insufficient documentation

## 2016-02-12 DIAGNOSIS — Z6832 Body mass index (BMI) 32.0-32.9, adult: Secondary | ICD-10-CM | POA: Diagnosis not present

## 2016-02-12 DIAGNOSIS — I2510 Atherosclerotic heart disease of native coronary artery without angina pectoris: Secondary | ICD-10-CM | POA: Diagnosis not present

## 2016-02-12 DIAGNOSIS — E785 Hyperlipidemia, unspecified: Secondary | ICD-10-CM | POA: Diagnosis not present

## 2016-02-12 DIAGNOSIS — Z954 Presence of other heart-valve replacement: Secondary | ICD-10-CM | POA: Diagnosis not present

## 2016-02-12 DIAGNOSIS — Z952 Presence of prosthetic heart valve: Secondary | ICD-10-CM

## 2016-02-19 ENCOUNTER — Ambulatory Visit (INDEPENDENT_AMBULATORY_CARE_PROVIDER_SITE_OTHER): Payer: PPO | Admitting: Pharmacist Clinician (PhC)/ Clinical Pharmacy Specialist

## 2016-02-19 DIAGNOSIS — Z954 Presence of other heart-valve replacement: Secondary | ICD-10-CM | POA: Diagnosis not present

## 2016-02-19 DIAGNOSIS — Z7901 Long term (current) use of anticoagulants: Secondary | ICD-10-CM | POA: Diagnosis not present

## 2016-02-19 DIAGNOSIS — Z952 Presence of prosthetic heart valve: Secondary | ICD-10-CM

## 2016-02-19 LAB — POCT INR: INR: 3.2

## 2016-02-27 ENCOUNTER — Other Ambulatory Visit: Payer: Self-pay | Admitting: Internal Medicine

## 2016-03-12 ENCOUNTER — Other Ambulatory Visit: Payer: Self-pay | Admitting: Internal Medicine

## 2016-03-18 ENCOUNTER — Ambulatory Visit: Payer: Self-pay | Admitting: Internal Medicine

## 2016-03-18 ENCOUNTER — Ambulatory Visit (INDEPENDENT_AMBULATORY_CARE_PROVIDER_SITE_OTHER): Payer: PPO | Admitting: Pharmacist Clinician (PhC)/ Clinical Pharmacy Specialist

## 2016-03-18 ENCOUNTER — Ambulatory Visit (INDEPENDENT_AMBULATORY_CARE_PROVIDER_SITE_OTHER): Payer: PPO | Admitting: Physician Assistant

## 2016-03-18 ENCOUNTER — Encounter: Payer: Self-pay | Admitting: Physician Assistant

## 2016-03-18 VITALS — BP 130/72 | HR 64 | Temp 97.5°F | Resp 16 | Ht 69.0 in | Wt 221.8 lb

## 2016-03-18 DIAGNOSIS — E1121 Type 2 diabetes mellitus with diabetic nephropathy: Secondary | ICD-10-CM | POA: Diagnosis not present

## 2016-03-18 DIAGNOSIS — Z954 Presence of other heart-valve replacement: Secondary | ICD-10-CM | POA: Diagnosis not present

## 2016-03-18 DIAGNOSIS — I10 Essential (primary) hypertension: Secondary | ICD-10-CM | POA: Diagnosis not present

## 2016-03-18 DIAGNOSIS — I2583 Coronary atherosclerosis due to lipid rich plaque: Secondary | ICD-10-CM

## 2016-03-18 DIAGNOSIS — Z79899 Other long term (current) drug therapy: Secondary | ICD-10-CM

## 2016-03-18 DIAGNOSIS — E785 Hyperlipidemia, unspecified: Secondary | ICD-10-CM

## 2016-03-18 DIAGNOSIS — Z7901 Long term (current) use of anticoagulants: Secondary | ICD-10-CM | POA: Diagnosis not present

## 2016-03-18 DIAGNOSIS — Z952 Presence of prosthetic heart valve: Secondary | ICD-10-CM

## 2016-03-18 DIAGNOSIS — E559 Vitamin D deficiency, unspecified: Secondary | ICD-10-CM | POA: Diagnosis not present

## 2016-03-18 DIAGNOSIS — I2510 Atherosclerotic heart disease of native coronary artery without angina pectoris: Secondary | ICD-10-CM

## 2016-03-18 LAB — HEMOGLOBIN A1C
Hgb A1c MFr Bld: 8.2 % — ABNORMAL HIGH
Mean Plasma Glucose: 189 mg/dL

## 2016-03-18 LAB — CBC WITH DIFFERENTIAL/PLATELET
Basophils Absolute: 57 {cells}/uL (ref 0–200)
Basophils Relative: 1 %
Eosinophils Absolute: 114 {cells}/uL (ref 15–500)
Eosinophils Relative: 2 %
HCT: 46.4 % (ref 38.5–50.0)
Hemoglobin: 15.1 g/dL (ref 13.2–17.1)
Lymphocytes Relative: 16 %
Lymphs Abs: 912 {cells}/uL (ref 850–3900)
MCH: 28 pg (ref 27.0–33.0)
MCHC: 32.5 g/dL (ref 32.0–36.0)
MCV: 85.9 fL (ref 80.0–100.0)
MPV: 11 fL (ref 7.5–12.5)
Monocytes Absolute: 399 {cells}/uL (ref 200–950)
Monocytes Relative: 7 %
Neutro Abs: 4218 {cells}/uL (ref 1500–7800)
Neutrophils Relative %: 74 %
Platelets: 182 10*3/uL (ref 140–400)
RBC: 5.4 MIL/uL (ref 4.20–5.80)
RDW: 16.1 % — ABNORMAL HIGH (ref 11.0–15.0)
WBC: 5.7 10*3/uL (ref 3.8–10.8)

## 2016-03-18 LAB — LIPID PANEL
Cholesterol: 148 mg/dL (ref 125–200)
HDL: 37 mg/dL — ABNORMAL LOW
LDL Cholesterol: 87 mg/dL
Total CHOL/HDL Ratio: 4 ratio
Triglycerides: 119 mg/dL
VLDL: 24 mg/dL

## 2016-03-18 LAB — BASIC METABOLIC PANEL WITHOUT GFR
BUN: 9 mg/dL (ref 7–25)
CO2: 25 mmol/L (ref 20–31)
Calcium: 9.7 mg/dL (ref 8.6–10.3)
Chloride: 103 mmol/L (ref 98–110)
Creat: 0.8 mg/dL (ref 0.70–1.18)
GFR, Est African American: >89 mL/min
GFR, Est Non African American: 89 mL/min
Glucose, Bld: 233 mg/dL — ABNORMAL HIGH (ref 65–99)
Potassium: 4.8 mmol/L (ref 3.5–5.3)
Sodium: 137 mmol/L (ref 135–146)

## 2016-03-18 LAB — HEPATIC FUNCTION PANEL
ALT: 18 U/L (ref 9–46)
AST: 17 U/L (ref 10–35)
Albumin: 4.5 g/dL (ref 3.6–5.1)
Alkaline Phosphatase: 88 U/L (ref 40–115)
Bilirubin, Direct: 0.2 mg/dL
Indirect Bilirubin: 0.7 mg/dL (ref 0.2–1.2)
Total Bilirubin: 0.9 mg/dL (ref 0.2–1.2)
Total Protein: 6.7 g/dL (ref 6.1–8.1)

## 2016-03-18 LAB — POCT INR: INR: 3.6

## 2016-03-18 LAB — MAGNESIUM: Magnesium: 2.1 mg/dL (ref 1.5–2.5)

## 2016-03-18 LAB — TSH: TSH: 1.46 m[IU]/L (ref 0.40–4.50)

## 2016-03-18 MED ORDER — CLOTRIMAZOLE-BETAMETHASONE 1-0.05 % EX CREA: TOPICAL_CREAM | CUTANEOUS | Status: DC

## 2016-03-18 MED ORDER — NYSTATIN 100000 UNIT/GM EX POWD: CUTANEOUS | Status: DC

## 2016-03-18 NOTE — Progress Notes (Signed)
Assessment and Plan:  Hypertension: Continue medication, monitor blood pressure at home. Continue DASH diet.  Reminder to go to the ER if any CP, SOB, nausea, dizziness, severe HA, changes vision/speech, left arm numbness and tingling and jaw pain. Cholesterol: Continue diet and exercise. Check cholesterol.  Diabetes with diabetic chronic kidney disease-Continue diet and exercise. Check A1C Vitamin D Def- check level and continue medications.  Obesity with co morbidities- long discussion about weight loss, diet, and exercise S/p AVR- Continue cardio follow up CABG/CAD-Control blood pressure, cholesterol, glucose, increase exercise.  Continue diet and meds as discussed. Further disposition pending results of labs. Discussed med's effects and SE's.   HPI 75 y.o. male  presents for 3 month follow up with hypertension, hyperlipidemia, diabetes and vitamin D. His blood pressure has been controlled at home, today their BP is BP: 130/72 mmHg He does not workout. He denies chest pain, shortness of breath, dizziness.  He has history of AVR and follows with coumadin clinic once a month. He has a history of CAD s/p CABG, follows with cardio.  He is on cholesterol medication, simvastatin 40mg  and denies myalgias. His cholesterol is at goal. The cholesterol was:  12/18/2015: Cholesterol 125; HDL 34*; LDL Cholesterol 71; Triglycerides 99 He has been working on diet and exercise for Diabetes with diabetic nephropathy and with other circulatory complications but last time kidney function was better with decrease in HCTZ, FGR 89, he is on bASA, he is on ACE/ARB,he is on cinnamon 500 BID, farxiga 10mg  on 1/2 pill due to urinary frequency, metformin 500mg  x 4, glipizide 10mg  1 AM and 1/2 lunch and dinner, checking sugars 1-2 x a week in the AM, running 120's and denies paresthesia of the feet, polydipsia, polyuria and visual disturbances. Last A1C was: 12/18/2015: Hgb A1c MFr Bld 7.0*  Patient is not on Vitamin D  supplement, he can not remember it. 09/10/2015: Vit D, 25-Hydroxy 41  BMI is Body mass index is 32.74 kg/(m^2)., he is working on diet and exercise. Wt Readings from Last 3 Encounters:  03/18/16 221 lb 12.8 oz (100.608 kg)  01/23/16 221 lb 1.6 oz (100.29 kg)  12/18/15 225 lb (102.059 kg)    Current Medications:  Current Outpatient Prescriptions on File Prior to Visit  Medication Sig Dispense Refill  . aspirin 81 MG tablet Take 81 mg by mouth daily.      . clotrimazole-betamethasone (LOTRISONE) cream APPLY TO AFFECTED AREA TWICE A DAY FOR 2 WEEKS 45 g 0  . cyanocobalamin (,VITAMIN B-12,) 1000 MCG/ML injection INJECT 1/2 ML EVERY MONTH 10 mL 1  . dapagliflozin propanediol (FARXIGA) 10 MG TABS tablet TAKE 1 TABLET BY MOUTH EVERY DAY FOR DIABETES 30 tablet 3  . enalapril (VASOTEC) 20 MG tablet Take 2 tablets (40 mg total) by mouth daily. 180 tablet 3  . glipiZIDE (GLUCOTROL) 10 MG tablet Take 1 tablet (10 mg total) by mouth 3 (three) times daily before meals. 270 tablet 3  . hydrochlorothiazide (HYDRODIURIL) 25 MG tablet TAKE 1 TABLET  EVERY DAY 90 tablet 0  . metFORMIN (GLUCOPHAGE-XR) 500 MG 24 hr tablet TAKE 2 TABLETS TWICE DAILY WITH A MEAL 360 tablet 1  . metoprolol succinate (TOPROL-XL) 50 MG 24 hr tablet Take 1 tablet by mouth daily.    Marland Kitchen nystatin (MYCOSTATIN/NYSTOP) 100000 UNIT/GM POWD APPLY TO AFFECTED AREA TWICE A DAY 60 g 0  . ranitidine (ZANTAC) 300 MG tablet TAKE 1 TO 2 TABLETS DAILY FOR HEARTBURN TO WEAN OFF OF PRILOSEC/OMEPRAZOLE 180 tablet 1  .  simvastatin (ZOCOR) 40 MG tablet TAKE 1 TABLET  EVERY DAY 90 tablet 3  . warfarin (COUMADIN) 5 MG tablet Take 1 tablet by mouth daily or as directed 90 tablet 1   No current facility-administered medications on file prior to visit.   Medical History:  Past Medical History  Diagnosis Date  . Hypertension   . GERD (gastroesophageal reflux disease)   . Cancer [facility])     prostate  . Anemia   . Type II or unspecified type diabetes  mellitus without mention of complication, not stated as uncontrolled   . Hyperlipidemia   . Umbilical hernia   . AS (aortic stenosis)     valve replacement-st. jude mechanical valve  . CAD (coronary artery disease), native coronary artery     CABG 07/18/2003  . History of nuclear stress test 02/12/10    EF 67%, normal perfusion  . ASHD (arteriosclerotic heart disease)   . Vitamin D deficiency   . Vitamin B12 deficiency   . Morbid obesity ([place])    Allergies: No Known Allergies   Review of Systems:  Review of Systems  Constitutional: Negative.   HENT: Negative.   Eyes: Negative.   Respiratory: Negative.   Cardiovascular: Negative.   Gastrointestinal: Negative.   Genitourinary: Positive for frequency. Negative for dysuria, urgency, hematuria and flank pain.  Musculoskeletal: Negative.   Skin: Negative.   Neurological: Negative.   Endo/Heme/Allergies: Negative.   Psychiatric/Behavioral: Negative.     Family history- Review and unchanged Social history- Review and unchanged Physical Exam: BP 130/72 mmHg  Pulse 64  Temp(Src) 97.5 F (36.4 C) (Temporal)  Resp 16  Ht 5\' 9"  (1.753 m)  Wt 221 lb 12.8 oz (100.608 kg)  BMI 32.74 kg/m2  SpO2 96% Wt Readings from Last 3 Encounters:  03/18/16 221 lb 12.8 oz (100.608 kg)  01/23/16 221 lb 1.6 oz (100.29 kg)  12/18/15 225 lb (102.059 kg)   General Appearance: Well nourished, in no apparent distress. Eyes: PERRLA, EOMs, conjunctiva no swelling or erythema Sinuses: No Frontal/maxillary tenderness ENT/Mouth: Ext aud canals clear, TMs without erythema, bulging. No erythema, swelling, or exudate on post pharynx.  Tonsils not swollen or erythematous. Hearing normal.  Neck: Supple, thyroid normal.  Respiratory: Respiratory effort normal, BS equal bilaterally without rales, rhonchi, wheezing or stridor.  Cardio: RRR with systolic click. Brisk peripheral pulses with 1+ edema.  Abdomen: Soft, + BS, obese  Non tender, no guarding, rebound,  hernias, masses. Lymphatics: Non tender without lymphadenopathy.  Musculoskeletal: Full ROM, 5/5 strength, normal gait.  Skin: Warm, dry without rashes, lesions, ecchymosis.  Neuro: Cranial nerves intact. No cerebellar symptoms. Sensation intact.  Psych: Awake and oriented X 3, normal affect, Insight and Judgment appropriate.    Vicie Mutters, PA-C 10:58 AM [facility]

## 2016-03-18 NOTE — Addendum Note (Signed)
Addended by: Vicie Mutters R on: 03/18/2016 11:30 AM   Modules accepted: Orders

## 2016-03-18 NOTE — Patient Instructions (Signed)
Recommendations For Diabetic/Prediabetic Patients:   -  Take medications as prescribed  -  Recommend Dr Fara Olden Fuhrman's book "The End of Diabetes "  And "The End of Dieting"- Can get at  www.[employer].com and encourage also get the Audio CD book  - AVOID Animal products, ie. Meat - red/white, Poultry and Dairy/especially cheese - Exercise at least 5 times a week for 30 minutes or preferably daily.  - No Smoking - Drink less than 2 drinks a day.  - Monitor your feet for sores - Have yearly Eye Exams - Recommend annual Flu vaccine  - Recommend Pneumovax and Prevnar vaccines - Shingles Vaccine (Zostavax) if over 74 y.o.  Goals:   - BMI less than 24 - Fasting sugar less than 130 or less than 150 if tapering medicines to lose weight  - Systolic BP less than [id number]  - Diastolic BP less than 80 - Bad LDL Cholesterol less than 70 - Triglycerides less than 150     Bad carbs also include fruit juice, alcohol, and sweet tea. These are empty calories that do not signal to your brain that you are full.   Please remember the good carbs are still carbs which convert into sugar. So please measure them out no more than 1/2-1 cup of rice, oatmeal, pasta, and beans  Veggies are however free foods! Pile them on.   Not all fruit is created equal. Please see the list below, the fruit at the bottom is higher in sugars than the fruit at the top. Please avoid all dried fruits.      We want weight loss that will last so you should lose 1-2 pounds a week.  THAT IS IT! Please pick THREE things a month to change. Once it is a habit check off the item. Then pick another three items off the list to become habits.  If you are already doing a habit on the list GREAT!  Cross that item off! o Don't drink your calories. Ie, alcohol, soda, fruit juice, and sweet tea.  o Drink more water. Drink a glass when you feel hungry or before each meal.  o Eat breakfast - Complex carb and protein (likeDannon light and fit yogurt,  oatmeal, fruit, eggs, [place] bacon). o Measure your cereal.  Eat no more than one cup a day. (ie [place]) o Eat an apple a day. o Add a vegetable a day. o Try a new vegetable a month. o Use Pam! Stop using oil or butter to cook. o Don't finish your plate or use smaller plates. o Share your dessert. o Eat sugar free Jello for dessert or frozen grapes. o Don't eat 2-3 hours before bed. o Switch to whole wheat bread, pasta, and brown rice. o Make healthier choices when you eat out. No fries! o Pick baked chicken, NOT fried. o Don't forget to SLOW DOWN when you eat. It is not going anywhere.  o Take the stairs. o Park far away in the parking lot o [employer] (or weights) for 10 minutes while watching TV. o Walk at work for 10 minutes during break. o Walk outside 1 time a week with your friend, kids, dog, or significant other. o Start a walking group at [place] the mall as much as you can tolerate.  o Keep a food diary. o Weigh yourself daily. o Walk for 15 minutes 3 days per week. o Cook at home more often and eat out less.  If life happens and you  go back to old habits, it is okay.  Just start over. You can do it!   If you experience chest pain, get short of breath, or tired during the exercise, please stop immediately and inform your doctor.

## 2016-03-19 LAB — VITAMIN D 25 HYDROXY (VIT D DEFICIENCY, FRACTURES): Vit D, 25-Hydroxy: 24 ng/mL — ABNORMAL LOW (ref 30–100)

## 2016-04-21 ENCOUNTER — Other Ambulatory Visit: Payer: Self-pay | Admitting: Internal Medicine

## 2016-04-22 ENCOUNTER — Ambulatory Visit (INDEPENDENT_AMBULATORY_CARE_PROVIDER_SITE_OTHER): Payer: PPO | Admitting: Pharmacist Clinician (PhC)/ Clinical Pharmacy Specialist

## 2016-04-22 DIAGNOSIS — Z7901 Long term (current) use of anticoagulants: Secondary | ICD-10-CM | POA: Diagnosis not present

## 2016-04-22 DIAGNOSIS — Z952 Presence of prosthetic heart valve: Secondary | ICD-10-CM

## 2016-04-22 DIAGNOSIS — Z954 Presence of other heart-valve replacement: Secondary | ICD-10-CM

## 2016-04-22 LAB — POCT INR: INR: 2.9

## 2016-05-01 ENCOUNTER — Other Ambulatory Visit: Payer: Self-pay | Admitting: Internal Medicine

## 2016-05-19 ENCOUNTER — Other Ambulatory Visit: Payer: Self-pay | Admitting: *Deleted

## 2016-05-19 MED ORDER — GLUCOSE BLOOD VI STRP: ORAL_STRIP | Status: DC

## 2016-05-20 ENCOUNTER — Ambulatory Visit (INDEPENDENT_AMBULATORY_CARE_PROVIDER_SITE_OTHER): Payer: PPO | Admitting: Pharmacist

## 2016-05-20 DIAGNOSIS — Z954 Presence of other heart-valve replacement: Secondary | ICD-10-CM

## 2016-05-20 DIAGNOSIS — Z7901 Long term (current) use of anticoagulants: Secondary | ICD-10-CM

## 2016-05-20 DIAGNOSIS — Z952 Presence of prosthetic heart valve: Secondary | ICD-10-CM

## 2016-05-20 LAB — POCT INR: INR: 3.7

## 2016-05-22 ENCOUNTER — Other Ambulatory Visit: Payer: Self-pay | Admitting: Internal Medicine

## 2016-06-17 ENCOUNTER — Ambulatory Visit (INDEPENDENT_AMBULATORY_CARE_PROVIDER_SITE_OTHER): Payer: PPO | Admitting: Pharmacist Clinician (PhC)/ Clinical Pharmacy Specialist

## 2016-06-17 DIAGNOSIS — Z7901 Long term (current) use of anticoagulants: Secondary | ICD-10-CM | POA: Diagnosis not present

## 2016-06-17 DIAGNOSIS — Z954 Presence of other heart-valve replacement: Secondary | ICD-10-CM

## 2016-06-17 DIAGNOSIS — Z952 Presence of prosthetic heart valve: Secondary | ICD-10-CM

## 2016-06-17 LAB — POCT INR: INR: 2.4

## 2016-06-20 ENCOUNTER — Encounter: Payer: Self-pay | Admitting: Physician Assistant

## 2016-06-20 ENCOUNTER — Ambulatory Visit (INDEPENDENT_AMBULATORY_CARE_PROVIDER_SITE_OTHER): Payer: PPO | Admitting: Physician Assistant

## 2016-06-20 VITALS — BP 130/70 | HR 63 | Temp 97.7°F | Resp 16 | Ht 69.0 in | Wt 222.0 lb

## 2016-06-20 DIAGNOSIS — E785 Hyperlipidemia, unspecified: Secondary | ICD-10-CM | POA: Diagnosis not present

## 2016-06-20 DIAGNOSIS — E1121 Type 2 diabetes mellitus with diabetic nephropathy: Secondary | ICD-10-CM

## 2016-06-20 DIAGNOSIS — I10 Essential (primary) hypertension: Secondary | ICD-10-CM

## 2016-06-20 DIAGNOSIS — Z79899 Other long term (current) drug therapy: Secondary | ICD-10-CM | POA: Diagnosis not present

## 2016-06-20 LAB — CBC WITH DIFFERENTIAL/PLATELET
Basophils Absolute: 0 {cells}/uL (ref 0–200)
Basophils Relative: 0 %
Eosinophils Absolute: 144 {cells}/uL (ref 15–500)
Eosinophils Relative: 2 %
HCT: 44.7 % (ref 38.5–50.0)
Hemoglobin: 14.9 g/dL (ref 13.2–17.1)
Lymphocytes Relative: 15 %
Lymphs Abs: 1080 {cells}/uL (ref 850–3900)
MCH: 29.3 pg (ref 27.0–33.0)
MCHC: 33.3 g/dL (ref 32.0–36.0)
MCV: 87.8 fL (ref 80.0–100.0)
MPV: 10.8 fL (ref 7.5–12.5)
Monocytes Absolute: 720 {cells}/uL (ref 200–950)
Monocytes Relative: 10 %
Neutro Abs: 5256 {cells}/uL (ref 1500–7800)
Neutrophils Relative %: 73 %
Platelets: 169 10*3/uL (ref 140–400)
RBC: 5.09 MIL/uL (ref 4.20–5.80)
RDW: 15 % (ref 11.0–15.0)
WBC: 7.2 10*3/uL (ref 3.8–10.8)

## 2016-06-20 LAB — HEPATIC FUNCTION PANEL
ALT: 17 U/L (ref 9–46)
AST: 17 U/L (ref 10–35)
Albumin: 4.1 g/dL (ref 3.6–5.1)
Alkaline Phosphatase: 80 U/L (ref 40–115)
Bilirubin, Direct: 0.2 mg/dL
Indirect Bilirubin: 0.7 mg/dL (ref 0.2–1.2)
Total Bilirubin: 0.9 mg/dL (ref 0.2–1.2)
Total Protein: 6.5 g/dL (ref 6.1–8.1)

## 2016-06-20 LAB — HEMOGLOBIN A1C
Hgb A1c MFr Bld: 7.4 % — ABNORMAL HIGH
Mean Plasma Glucose: 166 mg/dL

## 2016-06-20 LAB — BASIC METABOLIC PANEL WITHOUT GFR
BUN: 8 mg/dL (ref 7–25)
CO2: 23 mmol/L (ref 20–31)
Calcium: 9.1 mg/dL (ref 8.6–10.3)
Chloride: 105 mmol/L (ref 98–110)
Creat: 0.81 mg/dL (ref 0.70–1.18)
GFR, Est African American: >89 mL/min
GFR, Est Non African American: 88 mL/min
Glucose, Bld: 132 mg/dL — ABNORMAL HIGH (ref 65–99)
Potassium: 4.6 mmol/L (ref 3.5–5.3)
Sodium: 139 mmol/L (ref 135–146)

## 2016-06-20 LAB — LIPID PANEL
Cholesterol: 121 mg/dL — ABNORMAL LOW (ref 125–200)
HDL: 38 mg/dL — ABNORMAL LOW
LDL Cholesterol: 67 mg/dL
Total CHOL/HDL Ratio: 3.2 ratio
Triglycerides: 82 mg/dL
VLDL: 16 mg/dL

## 2016-06-20 LAB — TSH: TSH: 0.86 m[IU]/L (ref 0.40–4.50)

## 2016-06-20 NOTE — Progress Notes (Signed)
Assessment and Plan:  Essential hypertension - continue medications, DASH diet, exercise and monitor at home. Call if greater than 130/80.  -     CBC with Differential/Platelet -     BASIC METABOLIC PANEL WITH GFR -     Hepatic function panel -     TSH  Type II diabetes mellitus with nephropathy ([place]) Discussed general issues about diabetes pathophysiology and management., Educational material distributed., Suggested low cholesterol diet., Encouraged aerobic exercise., Discussed foot care., Reminded to get yearly retinal exam. -     BASIC METABOLIC PANEL WITH GFR -     Hemoglobin A1c - no longer can get farxiga, will get list of approved meds from insurance - samples given of farxiga 5mg   Morbid obesity, unspecified obesity type ([place]) Obesity with co morbidities- long discussion about weight loss, diet, and exercise  Medication management Continue supplement  Hyperlipidemia -continue medications, check lipids, decrease fatty foods, increase activity.  -     Lipid panel  Continue diet and meds as discussed. Further disposition pending results of labs. Discussed med's effects and SE's.   HPI 75 y.o. male  presents for 3 month follow up with hypertension, hyperlipidemia, diabetes and vitamin D. His blood pressure has been controlled at home, today their BP is BP: 130/70 He does not workout. He denies chest pain, shortness of breath, dizziness.  He has history of AVR and follows with coumadin clinic once a month. He has a history of CAD s/p CABG, follows with cardio.  He is on cholesterol medication, simvastatin 40mg  and denies myalgias. His cholesterol is at goal. The cholesterol was:  03/18/2016: Cholesterol 148; HDL 37; LDL Cholesterol 87; Triglycerides 119 He has been working on diet and exercise for Diabetes with diabetic nephropathy and with other circulatory complications but last time kidney function was better with stopping the HCTZ, he is on bASA, he is on ACE/ARB,he is on  cinnamon 500 BID, farxiga 10mg  went up to a full pill last visit, metformin 500mg  x 4, glipizide 10mg  1 AM and 1/2 lunch and dinner, checking sugars 1-2 x a week in the AM, running 160's and denies polydipsia, polyuria and visual disturbances. Last A1C was: 03/18/2016: Hgb A1c MFr Bld 8.2  Lab Results  Component Value Date   GFRNONAA 89 03/18/2016  Patient is not on Vitamin D supplement, he can not remember it. 03/18/2016: Vit D, 25-Hydroxy 24  BMI is Body mass index is 32.78 kg/m., he is working on diet and exercise. Wt Readings from Last 3 Encounters:  06/20/16 222 lb (100.7 kg)  03/18/16 221 lb 12.8 oz (100.6 kg)  01/23/16 221 lb 1.6 oz (100.3 kg)    Current Medications:  Current Outpatient Prescriptions on File Prior to Visit  Medication Sig Dispense Refill  . aspirin 81 MG tablet Take 81 mg by mouth daily.      . clotrimazole-betamethasone (LOTRISONE) cream APPLY TO AFFECTED AREA TWICE A DAY FOR 2 WEEKS 45 g 3  . cyanocobalamin (,VITAMIN B-12,) 1000 MCG/ML injection INJECT 1/2 ML EVERY MONTH 10 mL 1  . cyanocobalamin (,VITAMIN B-12,) 1000 MCG/ML injection INJECT 1/2 ML EVERY MONTH 10 mL 1  . enalapril (VASOTEC) 20 MG tablet Take 2 tablets (40 mg total) by mouth daily. 180 tablet 3  . FARXIGA 10 MG TABS tablet TAKE 1 TABLET BY MOUTH EVERY DAY FOR DIABETES 30 tablet 2  . glipiZIDE (GLUCOTROL) 10 MG tablet Take 1 tablet (10 mg total) by mouth 3 (three) times daily before meals. [place]  tablet 3  . glucose blood (ACCU-CHEK AVIVA PLUS) test strip Check blood sugar 1 time daily. [id number] 100 each 12  . metFORMIN (GLUCOPHAGE-XR) 500 MG 24 hr tablet TAKE 2 TABLETS TWICE DAILY WITH A MEAL 360 tablet 1  . metoprolol succinate (TOPROL-XL) 50 MG 24 hr tablet Take 1 tablet by mouth daily.    . metoprolol succinate (TOPROL-XL) 50 MG 24 hr tablet TAKE 1 TABLET BY MOUTH EVERY DAY 90 tablet 3  . nystatin (NYSTATIN) powder APPLY TO AFFECTED AREA TWICE A DAY 60 g 3  . ranitidine (ZANTAC) 300 MG tablet TAKE  1 TO 2 TABLETS DAILY FOR HEARTBURN TO WEAN OFF OF PRILOSEC/OMEPRAZOLE 180 tablet 1  . simvastatin (ZOCOR) 40 MG tablet TAKE 1 TABLET BY MOUTH EVERY DAY 90 tablet 3  . warfarin (COUMADIN) 5 MG tablet Take 1 tablet by mouth daily or as directed 90 tablet 1   No current facility-administered medications on file prior to visit.    Medical History:  Past Medical History:  Diagnosis Date  . Anemia   . AS (aortic stenosis)    valve replacement-st. jude mechanical valve  . ASHD (arteriosclerotic heart disease)   . CAD (coronary artery disease), native coronary artery    CABG 07/18/2003  . Cancer [facility])    prostate  . GERD (gastroesophageal reflux disease)   . History of nuclear stress test 02/12/10   EF 67%, normal perfusion  . Hyperlipidemia   . Hypertension   . Morbid obesity ([place])   . Type II or unspecified type diabetes mellitus without mention of complication, not stated as uncontrolled   . Umbilical hernia   . Vitamin B12 deficiency   . Vitamin D deficiency    Allergies: No Known Allergies   Review of Systems:  Review of Systems  Constitutional: Negative.   HENT: Negative.   Eyes: Negative.   Respiratory: Negative.   Cardiovascular: Negative.   Gastrointestinal: Negative.   Genitourinary: Positive for frequency. Negative for dysuria, flank pain, hematuria and urgency.  Musculoskeletal: Negative.   Skin: Negative.   Neurological: Negative.   Endo/Heme/Allergies: Negative.   Psychiatric/Behavioral: Negative.     Family history- Review and unchanged Social history- Review and unchanged Physical Exam: BP 130/70   Pulse 63   Temp 97.7 F (36.5 C) (Temporal)   Resp 16   Ht 5\' 9"  (1.753 m)   Wt 222 lb (100.7 kg)   SpO2 96%   BMI 32.78 kg/m  Wt Readings from Last 3 Encounters:  06/20/16 222 lb (100.7 kg)  03/18/16 221 lb 12.8 oz (100.6 kg)  01/23/16 221 lb 1.6 oz (100.3 kg)   General Appearance: Well nourished, in no apparent distress. Eyes: PERRLA, EOMs,  conjunctiva no swelling or erythema Sinuses: No Frontal/maxillary tenderness ENT/Mouth: Ext aud canals clear, TMs without erythema, bulging. No erythema, swelling, or exudate on post pharynx.  Tonsils not swollen or erythematous. Hearing normal.  Neck: Supple, thyroid normal.  Respiratory: Respiratory effort normal, BS equal bilaterally without rales, rhonchi, wheezing or stridor.  Cardio: RRR with systolic click. Brisk peripheral pulses with 1+ edema.  Abdomen: Soft, + BS, obese  Non tender, no guarding, rebound, hernias, masses. Lymphatics: Non tender without lymphadenopathy.  Musculoskeletal: Full ROM, 5/5 strength, normal gait.  Skin: Warm, dry without rashes, lesions, ecchymosis.  Neuro: Cranial nerves intact. No cerebellar symptoms. Sensation intact.  Psych: Awake and oriented X 3, normal affect, Insight and Judgment appropriate.    Vicie Mutters, PA-C 10:49 AM [facility]

## 2016-06-20 NOTE — Patient Instructions (Signed)
    Bad carbs also include fruit juice, alcohol, and sweet tea. These are empty calories that do not signal to your brain that you are full.   Please remember the good carbs are still carbs which convert into sugar. So please measure them out no more than 1/2-1 cup of rice, oatmeal, pasta, and beans  Veggies are however free foods! Pile them on.   Not all fruit is created equal. Please see the list below, the fruit at the bottom is higher in sugars than the fruit at the top. Please avoid all dried fruits.     Recommendations For Diabetic/Prediabetic Patients:   -  Take medications as prescribed  -  Recommend Dr Joel Fuhrman's book "The End of Diabetes "  And "The End of Dieting"- Can get at  www.[employer].com and encourage also get the Audio CD book  - AVOID Animal products, ie. Meat - red/white, Poultry and Dairy/especially cheese - Exercise at least 5 times a week for 30 minutes or preferably daily.  - No Smoking - Drink less than 2 drinks a day.  - Monitor your feet for sores - Have yearly Eye Exams - Recommend annual Flu vaccine  - Recommend Pneumovax and Prevnar vaccines - Shingles Vaccine (Zostavax) if over 60 y.o.  Goals:   - BMI less than 24 - Fasting sugar less than 130 or less than 150 if tapering medicines to lose weight  - Systolic BP less than 130  - Diastolic BP less than 80 - Bad LDL Cholesterol less than 70 - Triglycerides less than 150  

## 2016-07-22 ENCOUNTER — Ambulatory Visit (INDEPENDENT_AMBULATORY_CARE_PROVIDER_SITE_OTHER): Payer: PPO | Admitting: Pharmacist

## 2016-07-22 DIAGNOSIS — Z954 Presence of other heart-valve replacement: Secondary | ICD-10-CM

## 2016-07-22 DIAGNOSIS — Z952 Presence of prosthetic heart valve: Secondary | ICD-10-CM

## 2016-07-22 DIAGNOSIS — Z7901 Long term (current) use of anticoagulants: Secondary | ICD-10-CM | POA: Diagnosis not present

## 2016-07-22 LAB — POCT INR: INR: 3.8

## 2016-08-05 ENCOUNTER — Other Ambulatory Visit: Payer: Self-pay | Admitting: Internal Medicine

## 2016-08-19 ENCOUNTER — Ambulatory Visit (INDEPENDENT_AMBULATORY_CARE_PROVIDER_SITE_OTHER): Payer: PPO | Admitting: Pharmacist

## 2016-08-19 DIAGNOSIS — Z7901 Long term (current) use of anticoagulants: Secondary | ICD-10-CM | POA: Diagnosis not present

## 2016-08-19 DIAGNOSIS — Z952 Presence of prosthetic heart valve: Secondary | ICD-10-CM

## 2016-08-19 DIAGNOSIS — Z954 Presence of other heart-valve replacement: Secondary | ICD-10-CM

## 2016-08-19 LAB — POCT INR: INR: 2.7

## 2016-08-21 ENCOUNTER — Other Ambulatory Visit: Payer: Self-pay | Admitting: Internal Medicine

## 2016-08-29 ENCOUNTER — Other Ambulatory Visit: Payer: Self-pay | Admitting: Internal Medicine

## 2016-09-23 ENCOUNTER — Ambulatory Visit (INDEPENDENT_AMBULATORY_CARE_PROVIDER_SITE_OTHER): Payer: PPO | Admitting: Pharmacist

## 2016-09-23 DIAGNOSIS — Z952 Presence of prosthetic heart valve: Secondary | ICD-10-CM | POA: Diagnosis not present

## 2016-09-23 DIAGNOSIS — Z7901 Long term (current) use of anticoagulants: Secondary | ICD-10-CM | POA: Diagnosis not present

## 2016-09-23 LAB — POCT INR: INR: 3

## 2016-10-03 ENCOUNTER — Encounter: Payer: Self-pay | Admitting: Internal Medicine

## 2016-10-03 ENCOUNTER — Ambulatory Visit (INDEPENDENT_AMBULATORY_CARE_PROVIDER_SITE_OTHER): Payer: PPO | Admitting: Internal Medicine

## 2016-10-03 VITALS — BP 128/78 | HR 64 | Temp 97.6°F | Resp 16 | Ht 69.0 in | Wt 220.6 lb

## 2016-10-03 DIAGNOSIS — K219 Gastro-esophageal reflux disease without esophagitis: Secondary | ICD-10-CM

## 2016-10-03 DIAGNOSIS — Z136 Encounter for screening for cardiovascular disorders: Secondary | ICD-10-CM

## 2016-10-03 DIAGNOSIS — R6889 Other general symptoms and signs: Secondary | ICD-10-CM

## 2016-10-03 DIAGNOSIS — I2510 Atherosclerotic heart disease of native coronary artery without angina pectoris: Secondary | ICD-10-CM | POA: Diagnosis not present

## 2016-10-03 DIAGNOSIS — Z0001 Encounter for general adult medical examination with abnormal findings: Secondary | ICD-10-CM | POA: Diagnosis not present

## 2016-10-03 DIAGNOSIS — E559 Vitamin D deficiency, unspecified: Secondary | ICD-10-CM

## 2016-10-03 DIAGNOSIS — Z79899 Other long term (current) drug therapy: Secondary | ICD-10-CM | POA: Diagnosis not present

## 2016-10-03 DIAGNOSIS — E538 Deficiency of other specified B group vitamins: Secondary | ICD-10-CM | POA: Diagnosis not present

## 2016-10-03 DIAGNOSIS — Z125 Encounter for screening for malignant neoplasm of prostate: Secondary | ICD-10-CM

## 2016-10-03 DIAGNOSIS — Z952 Presence of prosthetic heart valve: Secondary | ICD-10-CM

## 2016-10-03 DIAGNOSIS — Z951 Presence of aortocoronary bypass graft: Secondary | ICD-10-CM

## 2016-10-03 DIAGNOSIS — E782 Mixed hyperlipidemia: Secondary | ICD-10-CM | POA: Diagnosis not present

## 2016-10-03 DIAGNOSIS — I10 Essential (primary) hypertension: Secondary | ICD-10-CM | POA: Diagnosis not present

## 2016-10-03 DIAGNOSIS — Z1211 Encounter for screening for malignant neoplasm of colon: Secondary | ICD-10-CM

## 2016-10-03 DIAGNOSIS — I2583 Coronary atherosclerosis due to lipid rich plaque: Secondary | ICD-10-CM

## 2016-10-03 DIAGNOSIS — Z1212 Encounter for screening for malignant neoplasm of rectum: Secondary | ICD-10-CM

## 2016-10-03 DIAGNOSIS — E1121 Type 2 diabetes mellitus with diabetic nephropathy: Secondary | ICD-10-CM

## 2016-10-03 LAB — CBC WITH DIFFERENTIAL/PLATELET
Basophils Absolute: 0 {cells}/uL (ref 0–200)
Basophils Relative: 0 %
Eosinophils Absolute: 98 {cells}/uL (ref 15–500)
Eosinophils Relative: 2 %
HCT: 45.7 % (ref 38.5–50.0)
Hemoglobin: 15.3 g/dL (ref 13.2–17.1)
Lymphocytes Relative: 18 %
Lymphs Abs: 882 {cells}/uL (ref 850–3900)
MCH: 30.1 pg (ref 27.0–33.0)
MCHC: 33.5 g/dL (ref 32.0–36.0)
MCV: 90 fL (ref 80.0–100.0)
MPV: 11.1 fL (ref 7.5–12.5)
Monocytes Absolute: 490 {cells}/uL (ref 200–950)
Monocytes Relative: 10 %
Neutro Abs: 3430 {cells}/uL (ref 1500–7800)
Neutrophils Relative %: 70 %
Platelets: 168 10*3/uL (ref 140–400)
RBC: 5.08 MIL/uL (ref 4.20–5.80)
RDW: 15.3 % — ABNORMAL HIGH (ref 11.0–15.0)
WBC: 4.9 10*3/uL (ref 3.8–10.8)

## 2016-10-03 LAB — TSH: TSH: 1.22 m[IU]/L (ref 0.40–4.50)

## 2016-10-03 NOTE — Progress Notes (Signed)
[facility]   Unk Pinto, M.D.    Uvaldo Bristle. Silverio Lay, P.A.-C      Starlyn Skeans, P.A.-C  [facility]                [address] [place], [place] [address] Telephone [phone] Telefax [phone] Annual  Screening/Preventative Visit  & Comprehensive Evaluation & Examination     This very nice 75 y.o. MWM presents for a Screening/Preventative Visit & comprehensive evaluation and management of multiple medical co-morbidities.  Patient has been followed for HTN, T2_NIDDM, ASCAD,  Hyperlipidemia and Vitamin D Deficiency. 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  He's followed by Dr Debara Pickett & in Mar 2016, he had a negative Nuclear Cardiac Stress Test.  Patient's BP has been controlled at home. Today's BP is 128/78. Patient denies any cardiac symptoms as chest pain, palpitations, shortness of breath, dizziness or ankle swelling.     Patient's hyperlipidemia is controlled with diet and medications. Patient denies myalgias or other medication SE's. 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 He reports FBG's range in the 160's! Last A1c was not at goal: Lab Results  Component Value Date   HGBA1C 7.4 (H) 06/20/2016       Finally, patient has history of Vitamin D Deficiency in 2012 of "28" and last vitamin D was still very low : Lab Results  Component Value Date   VD25OH 24 (L) 03/18/2016   Current Outpatient Prescriptions on File Prior to Visit  Medication Sig  . aspirin 81 MG  tablet Take 81 mg by mouth daily.    Marland Kitchen LOTRISONE crm APPLY TO AFFECTED AREA TWICE A DAY FOR 2 WEEKS  . VIT B-12 1000 MCG/ML injec INJECT 1/2 ML EVERY MONTH  . enalapril  20 MG tablet Take 2 tab (40 mg)daily.  Marland Kitchen FARXIGA 10 MG TABS tablet TAKE 1 TAB EVERY DAY FOR DIABETES  . glipiZIDE 10 MG tablet Take 1 tab 2 times daily before meals.  . metFORMIN-XR 500 MG  TAKE 2 TABLETS TWICE DAILY WITH A MEAL  . metoprolol succinate-XL 50 MG  TAKE 1 TABLET BY MOUTH EVERY DAY  . NYSTATIN powder APPLY TO AFFECTED AREA TWICE A DAY  . ranitidine  300 MG tablet TAKEs  2 TABLETS DAILY   . simvastatin  40 MG tablet TAKE 1 TABLET BY MOUTH EVERY DAY  . warfarin  5 MG tablet TAKE 1 TABLET BY MOUTH DAILY OR AS DIRECTED   No Known Allergies   Past Medical History:  Diagnosis Date  . Anemia   . AS (aortic stenosis)    valve replacement-st. jude mechanical valve  . ASHD (arteriosclerotic heart disease)   . CAD (coronary artery disease), native coronary artery    CABG 07/18/2003  . Cancer [facility])    prostate  . GERD (gastroesophageal reflux disease)   . History of nuclear stress test 02/12/10  EF 67%, normal perfusion  . Hyperlipidemia   . Hypertension   . Morbid obesity ([place])   . Type II or unspecified type diabetes mellitus without mention of complication, not stated as uncontrolled   . Umbilical hernia   . Vitamin B12 deficiency   . Vitamin D deficiency    Health Maintenance  Topic Date Due  . INFLUENZA VACCINE  06/24/2016  . FOOT EXAM  09/09/2016  . ZOSTAVAX  12/26/2023 (Originally 09/02/2002)  . OPHTHALMOLOGY EXAM  10/16/2016  . HEMOGLOBIN A1C  12/21/2016  . TETANUS/TDAP  11/24/2017  . COLONOSCOPY  10/08/2025  . PNA vac Low Risk Adult  Completed   Immunization History  Administered Date(s) Administered  . Influenza Whole 09/01/2012  . Influenza, High Dose Seasonal PF 09/27/2015  . Influenza-Unspecified 08/25/2016  . Pneumococcal Conjugate-13 12/18/2015  . Pneumococcal Polysaccharide-23  09/01/2012  . Tdap 11/25/2007   Past Surgical History:  Procedure Laterality Date  . AORTIC VALVE REPLACEMENT  07/18/2003   22mm ATS-AP valve  . AORTIC VALVE REPLACEMENT (AVR)/CORONARY ARTERY BYPASS GRAFTING (CABG)  07/18/2003   AVR; bypass x 2-SVG to first diag, SVG to posterior descending  . CARDIAC CATHETERIZATION  07/13/2003   AS-aortic valve gradient 11mmhg, valve area 1.1; obstruction of ostium of posterior lateral branch with obstruction in the ostium of first diagnonal  . HERNIA REPAIR  53/29/9242   RIH, umbilical  . NM MYOCAR PERF WALL MOTION  02/12/2010   protocol:Bruce, EF 67%, exercise cap 7 METS, low risk scan  . TEE WITHOUT CARDIOVERSION  07/18/2003   no evidence of PE, Aortic valve heavily calcified,   . TRANSTHORACIC ECHOCARDIOGRAM  02/21/2013   EF60-65%, Septal motion showed paradox. atrium moderately dilated   Family History  Problem Relation Age of Onset  . Stroke Mother   . Heart attack Father   . Heart disease Father   . Cancer Brother   . Cancer Sister    Social History   Social History  . Marital status: Married    Spouse name: N/A  . Number of children: N/A  . Years of education: N/A   Occupational History  . Drives an [employer]   Social History Main Topics  . Smoking status: Never Smoker  . Smokeless tobacco: Never Used  . Alcohol use No  . Drug use: No  . Sexual activity: Not on file    ROS Constitutional: Denies fever, chills, weight loss/gain, headaches, insomnia,  night sweats or change in appetite. Does c/o fatigue. Eyes: Denies redness, blurred vision, diplopia, discharge, itchy or watery eyes.  ENT: Denies discharge, congestion, post nasal drip, epistaxis, sore throat, earache, hearing loss, dental pain, Tinnitus, Vertigo, Sinus pain or snoring.  Cardio: Denies chest pain, palpitations, irregular heartbeat, syncope, dyspnea, diaphoresis, orthopnea, PND, claudication or edema Respiratory: denies cough, dyspnea, DOE,  pleurisy, hoarseness, laryngitis or wheezing.  Gastrointestinal: Denies dysphagia, heartburn, reflux, water brash, pain, cramps, nausea, vomiting, bloating, diarrhea, constipation, hematemesis, melena, hematochezia, jaundice or hemorrhoids Genitourinary: Denies dysuria, frequency, urgency, nocturia, hesitancy, discharge, hematuria or flank pain Musculoskeletal: Denies arthralgia, myalgia, stiffness, Jt. Swelling, pain, limp or strain/sprain. Denies Falls. Skin: Denies puritis, rash, hives, warts, acne, eczema or change in skin lesion Neuro: No weakness, tremor, incoordination, spasms, paresthesia or pain Psychiatric: Denies confusion, memory loss or sensory loss. Denies Depression. Endocrine: Denies change in weight, skin, hair change, nocturia, and paresthesia, diabetic polys, visual blurring or hyper / hypo glycemic episodes.  Heme/Lymph: No excessive bleeding, bruising or enlarged lymph nodes.  Physical  Exam  BP 128/78   Pulse 64   Temp 97.6 F (36.4 C)   Resp 16   Ht 5\' 9"  (1.753 m)   Wt 220 lb 9.6 oz (100.1 kg)   BMI 32.58 kg/m   General Appearance: Well nourished, in no apparent distress.  Eyes: PERRLA, EOMs, conjunctiva no swelling or erythema, normal fundi and vessels. Sinuses: No frontal/maxillary tenderness ENT/Mouth: EACs patent / TMs  nl. Nares clear without erythema, swelling, mucoid exudates. Oral hygiene is good. No erythema, swelling, or exudate. Tongue normal, non-obstructing. Tonsils not swollen or erythematous. Hearing normal.  Neck: Supple, thyroid normal. No bruits, nodes or JVD. Respiratory: Respiratory effort normal.  BS equal and clear bilateral without rales, rhonci, wheezing or stridor. Cardio: Prosthetic heart sounds w/o sig M with regular rate and rhythm and no murmurs, rubs or gallops. Peripheral pulses are normal and equal bilaterally without edema. No aortic or femoral bruits. Chest: Median Sternotomy scar.   Abdomen: Soft, with Nl bowel sounds.  Nontender, no guarding, rebound, hernias, masses, or organomegaly.  Lymphatics: Non tender without lymphadenopathy.  Genitourinary: No hernias.Testes nl. DRE - prostate nl for age - smooth & firm w/o nodules. Musculoskeletal: Full ROM all peripheral extremities, joint stability, 5/5 strength, and normal gait. Skin: Warm and dry without rashes, lesions, cyanosis, clubbing or  ecchymosis.  Neuro: Cranial nerves intact, reflexes equal bilaterally. Normal muscle tone, no cerebellar symptoms. Sensation intact touch, vibratory and Monofilament to the toes bilaterally.  Pysch: Alert and oriented X 3 with normal affect, insight and judgment appropriate.   Assessment and Plan  1. Annual Preventative/Screening Exam   - Microalbumin / creatinine urine ratio - EKG 12-Lead - [place], RETROPERITNL ABD,  LTD - POC Hemoccult Bld/Stl  - Urinalysis, Routine w reflex microscopic  - Vitamin B12 - PSA - HM DIABETES FOOT EXAM - LOW EXTREMITY NEUR EXAM DOCUM - CBC with Differential/Platelet - BASIC METABOLIC PANEL WITH GFR - Hepatic function panel - Magnesium - Lipid panel - TSH - Hemoglobin A1c - Insulin, random - VITAMIN D 25 Hydroxy   2. Essential hypertension  - Microalbumin / creatinine urine ratio - EKG 12-Lead - [place], RETROPERITNL ABD,  LTD - TSH  3. Mixed hyperlipidemia  - EKG 12-Lead - [place], RETROPERITNL ABD,  LTD - Lipid panel - TSH  4. Type II diabetes mellitus with nephropathy (HCC)  - Microalbumin / creatinine urine ratio - EKG 12-Lead - [place], RETROPERITNL ABD,  LTD - HM DIABETES FOOT EXAM - LOW EXTREMITY NEUR EXAM DOCUM - Hemoglobin A1c - Insulin, random  5. Vitamin D deficiency  - VITAMIN D 25 Hydroxy   6. Coronary artery disease due to lipid rich plaque  - EKG 12-Lead  7. S/P AVR   8. Hx of CABG   9. Vitamin B12 deficiency  - Vitamin B12  10. Gastroesophageal reflux disease   11. Screening for ischemic heart disease  - EKG 12-Lead  12. Screening for AAA  (aortic abdominal aneurysm)  - [place], RETROPERITNL ABD,  LTD  13. Medication management  - Urinalysis, Routine w reflex microscopic  - CBC with Differential/Platelet - BASIC METABOLIC PANEL WITH GFR - Hepatic function panel - Magnesium  14. Screening for colorectal cancer  - POC Hemoccult Bld/Stl   15. Prostate cancer screening  - PSA       Continue prudent diet as discussed, weight control, BP monitoring, regular exercise, and medications as discussed.  Discussed med effects and SE's. Routine screening labs and tests as requested with regular follow-up  as recommended. Over 40 minutes of exam, counseling, chart review and high complex critical decision making was performed

## 2016-10-03 NOTE — Patient Instructions (Signed)

## 2016-10-04 LAB — LIPID PANEL
Cholesterol: 117 mg/dL
HDL: 37 mg/dL — ABNORMAL LOW
LDL Cholesterol: 64 mg/dL
Total CHOL/HDL Ratio: 3.2 ratio
Triglycerides: 79 mg/dL
VLDL: 16 mg/dL

## 2016-10-04 LAB — BASIC METABOLIC PANEL WITHOUT GFR
BUN: 10 mg/dL (ref 7–25)
CO2: 22 mmol/L (ref 20–31)
Calcium: 9.3 mg/dL (ref 8.6–10.3)
Chloride: 105 mmol/L (ref 98–110)
Creat: 0.79 mg/dL (ref 0.70–1.18)
GFR, Est African American: >89 mL/min
GFR, Est Non African American: 88 mL/min
Glucose, Bld: 174 mg/dL — ABNORMAL HIGH (ref 65–99)
Potassium: 4.3 mmol/L (ref 3.5–5.3)
Sodium: 140 mmol/L (ref 135–146)

## 2016-10-04 LAB — MICROALBUMIN / CREATININE URINE RATIO
Creatinine, Urine: 71 mg/dL (ref 20–370)
Microalb, Ur: <0.2 mg/dL

## 2016-10-04 LAB — HEPATIC FUNCTION PANEL
ALT: 17 U/L (ref 9–46)
AST: 18 U/L (ref 10–35)
Albumin: 4.4 g/dL (ref 3.6–5.1)
Alkaline Phosphatase: 70 U/L (ref 40–115)
Bilirubin, Direct: 0.3 mg/dL — ABNORMAL HIGH
Indirect Bilirubin: 0.9 mg/dL (ref 0.2–1.2)
Total Bilirubin: 1.2 mg/dL (ref 0.2–1.2)
Total Protein: 6.7 g/dL (ref 6.1–8.1)

## 2016-10-04 LAB — URINALYSIS, ROUTINE W REFLEX MICROSCOPIC
Bilirubin Urine: NEGATIVE
Hgb urine dipstick: NEGATIVE
Ketones, ur: NEGATIVE
Leukocytes, UA: NEGATIVE
Nitrite: NEGATIVE
Protein, ur: NEGATIVE
Specific Gravity, Urine: 1.038 — ABNORMAL HIGH (ref 1.001–1.035)
pH: 5 (ref 5.0–8.0)

## 2016-10-04 LAB — URINALYSIS, MICROSCOPIC ONLY
Bacteria, UA: NONE SEEN [HPF]
Casts: NONE SEEN [LPF]
Crystals: NONE SEEN [HPF]
RBC / HPF: NONE SEEN RBC/HPF
Squamous Epithelial / HPF: NONE SEEN [HPF]
Yeast: NONE SEEN [HPF]

## 2016-10-04 LAB — HEMOGLOBIN A1C
Hgb A1c MFr Bld: 7 % — ABNORMAL HIGH
Mean Plasma Glucose: 154 mg/dL

## 2016-10-04 LAB — INSULIN, RANDOM: Insulin: 9.9 u[IU]/mL (ref 2.0–19.6)

## 2016-10-04 LAB — VITAMIN D 25 HYDROXY (VIT D DEFICIENCY, FRACTURES): Vit D, 25-Hydroxy: 33 ng/mL (ref 30–100)

## 2016-10-04 LAB — PSA: PSA: <0.1 ng/mL

## 2016-10-04 LAB — VITAMIN B12: Vitamin B-12: 230 pg/mL (ref 200–1100)

## 2016-10-04 LAB — MAGNESIUM: Magnesium: 1.9 mg/dL (ref 1.5–2.5)

## 2016-10-21 ENCOUNTER — Ambulatory Visit (INDEPENDENT_AMBULATORY_CARE_PROVIDER_SITE_OTHER): Payer: PPO | Admitting: Pharmacist Clinician (PhC)/ Clinical Pharmacy Specialist

## 2016-10-21 DIAGNOSIS — Z952 Presence of prosthetic heart valve: Secondary | ICD-10-CM

## 2016-10-21 DIAGNOSIS — Z7901 Long term (current) use of anticoagulants: Secondary | ICD-10-CM | POA: Diagnosis not present

## 2016-10-21 LAB — POCT INR: INR: 2.3

## 2016-10-22 DIAGNOSIS — E119 Type 2 diabetes mellitus without complications: Secondary | ICD-10-CM | POA: Diagnosis not present

## 2016-10-22 DIAGNOSIS — H2513 Age-related nuclear cataract, bilateral: Secondary | ICD-10-CM | POA: Diagnosis not present

## 2016-10-25 ENCOUNTER — Encounter: Payer: Self-pay | Admitting: *Deleted

## 2016-11-03 ENCOUNTER — Other Ambulatory Visit: Payer: Self-pay | Admitting: Internal Medicine

## 2016-11-14 ENCOUNTER — Ambulatory Visit (INDEPENDENT_AMBULATORY_CARE_PROVIDER_SITE_OTHER): Payer: PPO | Admitting: Physician Assistant

## 2016-11-14 VITALS — BP 112/72 | HR 56 | Temp 97.5°F | Resp 16 | Ht 69.0 in | Wt 219.2 lb

## 2016-11-14 DIAGNOSIS — R300 Dysuria: Secondary | ICD-10-CM

## 2016-11-14 MED ORDER — FLUCONAZOLE 150 MG PO TABS: 150.0000 mg | ORAL_TABLET | Freq: Once | ORAL | 0 refills | Status: AC

## 2016-11-14 MED ORDER — SULFAMETHOXAZOLE-TRIMETHOPRIM 800-160 MG PO TABS: 1.0000 | ORAL_TABLET | Freq: Two times a day (BID) | ORAL | 0 refills | Status: DC

## 2016-11-14 NOTE — Progress Notes (Signed)
Subjective:    Patient ID: Billy Reilly, male    DOB: 07-16-1941, 75 y.o.   MRN: [id number]  HPI 75 y.o. morbidly obese WM with diabetes presents with dysuria. He denies new sexual partners, no discharge, no itching/rash, no fever, chills, no weak stream, no dribbling, hesitancy. Just frequency, urgency, and dysuria. Sugars have been "normal" for him, around 140's. Getting coumadin checked next week with Dr. Debara Pickett office.   Blood pressure 112/72, pulse (!) 56, temperature 97.5 F (36.4 C), resp. rate 16, height 5\' 9"  (1.753 m), weight 219 lb 3.2 oz (99.4 kg).  Medications Current Outpatient Prescriptions on File Prior to Visit  Medication Sig  . aspirin 81 MG tablet Take 81 mg by mouth daily.    . clotrimazole-betamethasone (LOTRISONE) cream APPLY TO AFFECTED AREA TWICE A DAY FOR 2 WEEKS  . cyanocobalamin (,VITAMIN B-12,) 1000 MCG/ML injection INJECT 1/2 ML EVERY MONTH  . enalapril (VASOTEC) 20 MG tablet Take 2 tablets (40 mg total) by mouth daily.  Marland Kitchen FARXIGA 10 MG TABS tablet TAKE 1 TABLET BY MOUTH EVERY DAY FOR DIABETES  . glipiZIDE (GLUCOTROL) 10 MG tablet TAKE 1 TABLET BY MOUTH 3 TIMES A DAY BEFORE MEALS  . glucose blood (ACCU-CHEK AVIVA PLUS) test strip Check blood sugar 1 time daily. [id number]  . metFORMIN (GLUCOPHAGE-XR) 500 MG 24 hr tablet TAKE 2 TABLETS TWICE DAILY WITH A MEAL  . metoprolol succinate (TOPROL-XL) 50 MG 24 hr tablet TAKE 1 TABLET BY MOUTH EVERY DAY  . nystatin (NYSTATIN) powder APPLY TO AFFECTED AREA TWICE A DAY  . ranitidine (ZANTAC) 300 MG tablet TAKE 1 TO 2 TABLETS DAILY FOR HEARTBURN TO WEAN OFF OF PRILOSEC/OMEPRAZOLE  . simvastatin (ZOCOR) 40 MG tablet TAKE 1 TABLET BY MOUTH EVERY DAY  . warfarin (COUMADIN) 5 MG tablet TAKE 1 TABLET BY MOUTH DAILY OR AS DIRECTED   No current facility-administered medications on file prior to visit.     Problem list He has Umbilical hernia; Long term current use of anticoagulant therapy; S/P AVR; Hyperlipidemia; Type II  diabetes mellitus with nephropathy ([place]); Vitamin D deficiency; Vitamin B12 deficiency; Morbid obesity ([place]); Essential hypertension; Medication management; CAD (coronary artery disease); Encounter for Department of Transportation (DOT) examination for driving license renewal; Hx of CABG; Encounter for Medicare annual wellness exam; and BMI 31.94,adult on his problem list.   Review of Systems  Constitutional: Negative.  Negative for chills and fever.  Respiratory: Negative.   Cardiovascular: Negative.   Gastrointestinal: Negative.   Genitourinary: Positive for dysuria, frequency and urgency. Negative for decreased urine volume, difficulty urinating, discharge, enuresis, flank pain, genital sores, hematuria, penile pain, scrotal swelling and testicular pain.  Musculoskeletal: Negative.  Negative for back pain.  Skin: Negative.  Negative for rash.       Objective:   Physical Exam  Constitutional: He is oriented to person, place, and time. He appears well-developed.  HENT:  Head: Normocephalic and atraumatic.  Right Ear: External ear normal.  Left Ear: External ear normal.  Eyes: Conjunctivae are normal. Pupils are equal, round, and reactive to light.  Neck: Normal range of motion. Neck supple.  Cardiovascular: Normal pulses.  An irregularly irregular rhythm present. Tachycardia present.   Systolic click  Pulmonary/Chest: No accessory muscle usage. No respiratory distress. He has no decreased breath sounds. He has no wheezes. He has rhonchi. He has no rales.  Abdominal: Soft. There is no tenderness. There is no rebound and no CVA tenderness.  Lymphadenopathy:    He has  no cervical adenopathy.  Neurological: He is alert and oriented to person, place, and time. No cranial nerve deficit.  Skin: He is not diaphoretic.     Assessment & Plan:  1. Dysuria Cut back to 1/2 coumadin daily, let coumadin clinic know on bactrim, has INr check next week If not better follow up in the office, if  any worse symptoms, fever, chills, unable to urinate, etc go to ER, patient understands.  - Urinalysis, Routine w reflex microscopic - Urine culture - fluconazole (DIFLUCAN) 150 MG tablet; Take 1 tablet (150 mg total) by mouth once. If needed  Dispense: 1 tablet; Refill: 0 - sulfamethoxazole-trimethoprim (BACTRIM DS,SEPTRA DS) 800-160 MG tablet; Take 1 tablet by mouth 2 (two) times daily.  Dispense: 14 tablet; Refill: 0   Future Appointments Date Time Provider [place]  11/19/2016 10:40 AM [facility]  01/06/2017 10:30 AM Starlyn Skeans, PA-C [facility] None  04/07/2017 10:30 AM Unk Pinto, MD [facility] None  10/23/2017 10:00 AM Unk Pinto, MD [facility] None

## 2016-11-14 NOTE — Patient Instructions (Addendum)
Take the bactrim twice daily for 7 days for UTI This can increase your coumadin so please take 1/2 pill every day while on the the antibiotic, and then go back to the 1/2 x 4 days a week and 1 pill 3 days a week Let Dr. Debara Pickett office know that you are on bactrim Only take the diflucan if you get a yeast infection Very strict diet, high sugar can cause increase urination   Urinary Frequency The number of times a normal person urinates depends upon how much liquid they take in and how much liquid they are losing. If the temperature is hot and there is high humidity, then the person will sweat more and usually breathe a little more frequently. These factors decrease the amount of frequency of urination that would be considered normal. The amount you drink is easily determined, but the amount of fluid lost is sometimes more difficult to calculate.  Fluid is lost in two ways:  Sensible fluid loss is usually measured by the amount of urine that you get rid of. Losses of fluid can also occur with diarrhea.  Insensible fluid loss is more difficult to measure. It is caused by evaporation. Insensible loss of fluid occurs through breathing and sweating. It usually ranges from a little less than a quart to a little more than a quart of fluid a day. In normal temperatures and activity levels, the average person may urinate 4 to 7 times in a 24-hour period. Needing to urinate more often than that could indicate a problem. If one urinates 4 to 7 times in 24 hours and has large volumes each time, that could indicate a different problem from one who urinates 4 to 7 times a day and has small volumes. The time of urinating is also important. Most urinating should be done during the waking hours. Getting up at night to urinate frequently can indicate some problems. CAUSES  The bladder is the organ in your lower abdomen that holds urine. Like a balloon, it swells some as it fills up. Your nerves sense this and tell you  it is time to head for the bathroom. There are a number of reasons that you might feel the need to urinate more often than usual. They include:  Urinary tract infection. This is usually associated with other signs such as burning when you urinate.  In men, problems with the prostate (a walnut-size gland that is located near the tube that carries urine out of your body). There are two reasons why the prostate can cause an increased frequency of urination:  An enlarged prostate that does not let the bladder empty well. If the bladder only half empties when you urinate, then it only has half the capacity to fill before you have to urinate again.  The nerves in the bladder become more hypersensitive with an increased size of the prostate even if the bladder empties completely.  Pregnancy.  Obesity. Excess weight is more likely to cause a problem for women than for men.  Bladder stones or other bladder problems.  Caffeine.  Alcohol.  Medications. For example, drugs that help the body get rid of extra fluid (diuretics) increase urine production. Some other medicines must be taken with lots of fluids.  Muscle or nerve weakness. This might be the result of a spinal cord injury, a stroke, multiple sclerosis, or Parkinson disease.  Long-standing diabetes can decrease the sensation of the bladder. This loss of sensation makes it harder to sense the bladder needs  to be emptied. Over a period of years, the bladder is stretched out by constant overfilling. This weakens the bladder muscles so that the bladder does not empty well and has less capacity to fill with new urine.  Interstitial cystitis (also called painful bladder syndrome). This condition develops because the tissues that line the inside of the bladder are inflamed (inflammation is the body's way of reacting to injury or infection). It causes pain and frequent urination. It occurs in women more often than in men. DIAGNOSIS   To decide what  might be causing your urinary frequency, your health care provider will probably:  Ask about symptoms you have noticed.  Ask about your overall health. This will include questions about any medications you are taking.  Do a physical examination.  Order some tests. These might include:  A blood test to check for diabetes or other health issues that could be contributing to the problem.  Urine testing. This could measure the flow of urine and the pressure on the bladder.  A test of your neurological system (the brain, spinal cord, and nerves). This is the system that senses the need to urinate.  A bladder test to check whether it is emptying completely when you urinate.  Cystoscopy. This test uses a thin tube with a tiny camera on it. It offers a look inside your urethra and bladder to see if there are problems.  Imaging tests. You might be given a contrast dye and then asked to urinate. X-rays are taken to see how your bladder is working. TREATMENT  It is important for you to be evaluated to determine if the amount or frequency that you have is unusual or abnormal. If it is found to be abnormal, the cause should be determined and this can usually be found out easily. Depending upon the cause, treatment could include medication, stimulation of the nerves, or surgery. There are not too many things that you can do as an individual to change your urinary frequency. It is important that you balance the amount of fluid intake needed to compensate for your activity and the temperature. Medical problems will be diagnosed and taken care of by your physician. There is no particular bladder training such as Kegel exercises that you can do to help urinary frequency. This is an exercise that is usually recommended for people who have leaking of urine when they laugh, cough, or sneeze. HOME CARE INSTRUCTIONS   Take any medications your health care provider prescribed or suggested. Follow the directions  carefully.  Practice any lifestyle changes that are recommended. These might include:  Drinking less fluid or drinking at different times of the day. If you need to urinate often during the night, for example, you may need to stop drinking fluids early in the evening.  Cutting down on caffeine or alcohol. They both can make you need to urinate more often than normal. Caffeine is found in coffee, tea, and sodas.  Losing weight, if that is recommended.  Keep a journal or a log. You might be asked to record how much you drink and when and where you feel the need to urinate. This will also help evaluate how well the treatment provided by your physician is working. SEEK MEDICAL CARE IF:   Your need to urinate often gets worse.  You feel increased pain or irritation when you urinate.  You notice blood in your urine.  You have questions about any medications that your health care provider recommended.  You  notice blood, pus, or swelling at the site of any test or treatment procedure.  You develop a fever of more than 100.77F (38.1C). SEEK IMMEDIATE MEDICAL CARE IF:  You develop a fever of more than 102.39F (38.9C). This information is not intended to replace advice given to you by your health care provider. Make sure you discuss any questions you have with your health care provider. Document Released: 09/06/2009 Document Revised: 12/01/2014 Document Reviewed: 06/06/2015 Elsevier Interactive Patient Education  2017 [employer].

## 2016-11-15 LAB — URINALYSIS, MICROSCOPIC ONLY
Bacteria, UA: NONE SEEN [HPF]
Casts: NONE SEEN [LPF]
Crystals: NONE SEEN [HPF]
Squamous Epithelial / HPF: NONE SEEN [HPF]
WBC, UA: >60 WBC/HPF — AB
Yeast: NONE SEEN [HPF]

## 2016-11-15 LAB — URINALYSIS, ROUTINE W REFLEX MICROSCOPIC
Bilirubin Urine: NEGATIVE
Ketones, ur: NEGATIVE
Nitrite: NEGATIVE
Protein, ur: NEGATIVE
Specific Gravity, Urine: 1.041 — ABNORMAL HIGH (ref 1.001–1.035)
pH: 5 (ref 5.0–8.0)

## 2016-11-18 LAB — URINE CULTURE

## 2016-11-19 ENCOUNTER — Ambulatory Visit (INDEPENDENT_AMBULATORY_CARE_PROVIDER_SITE_OTHER): Payer: PPO | Admitting: Pharmacist

## 2016-11-19 DIAGNOSIS — Z7901 Long term (current) use of anticoagulants: Secondary | ICD-10-CM | POA: Diagnosis not present

## 2016-11-19 DIAGNOSIS — Z952 Presence of prosthetic heart valve: Secondary | ICD-10-CM | POA: Diagnosis not present

## 2016-11-19 LAB — POCT INR: INR: 1.8

## 2016-11-26 ENCOUNTER — Other Ambulatory Visit: Payer: Self-pay

## 2016-11-26 DIAGNOSIS — Z1212 Encounter for screening for malignant neoplasm of rectum: Secondary | ICD-10-CM

## 2016-11-26 DIAGNOSIS — Z0001 Encounter for general adult medical examination with abnormal findings: Secondary | ICD-10-CM

## 2016-11-26 DIAGNOSIS — Z1211 Encounter for screening for malignant neoplasm of colon: Secondary | ICD-10-CM

## 2016-11-26 LAB — POC HEMOCCULT BLD/STL (HOME/3-CARD/SCREEN)
Card #2 Fecal Occult Blod, POC: NEGATIVE
Card #3 Fecal Occult Blood, POC: NEGATIVE
Fecal Occult Blood, POC: NEGATIVE

## 2016-11-26 NOTE — Progress Notes (Signed)
Pt aware of lab results & voiced understanding of those results. PT STATES HE HAS AN APPT IN FEB & STATES HE WILL GET URINE RECHECKED THEN. (13th FEB 2018.)

## 2016-12-23 ENCOUNTER — Ambulatory Visit (INDEPENDENT_AMBULATORY_CARE_PROVIDER_SITE_OTHER): Payer: PPO | Admitting: Pharmacist

## 2016-12-23 DIAGNOSIS — Z7901 Long term (current) use of anticoagulants: Secondary | ICD-10-CM | POA: Diagnosis not present

## 2016-12-23 DIAGNOSIS — Z952 Presence of prosthetic heart valve: Secondary | ICD-10-CM | POA: Diagnosis not present

## 2016-12-23 LAB — POCT INR: INR: 2.4

## 2017-01-06 ENCOUNTER — Encounter: Payer: Self-pay | Admitting: Internal Medicine

## 2017-01-06 ENCOUNTER — Ambulatory Visit (INDEPENDENT_AMBULATORY_CARE_PROVIDER_SITE_OTHER): Payer: PPO | Admitting: Internal Medicine

## 2017-01-06 VITALS — BP 156/82 | HR 58 | Temp 98.2°F | Resp 16 | Ht 69.0 in | Wt 220.0 lb

## 2017-01-06 DIAGNOSIS — E559 Vitamin D deficiency, unspecified: Secondary | ICD-10-CM

## 2017-01-06 DIAGNOSIS — Z79899 Other long term (current) drug therapy: Secondary | ICD-10-CM | POA: Diagnosis not present

## 2017-01-06 DIAGNOSIS — R300 Dysuria: Secondary | ICD-10-CM

## 2017-01-06 DIAGNOSIS — K429 Umbilical hernia without obstruction or gangrene: Secondary | ICD-10-CM

## 2017-01-06 DIAGNOSIS — Z0001 Encounter for general adult medical examination with abnormal findings: Secondary | ICD-10-CM | POA: Diagnosis not present

## 2017-01-06 DIAGNOSIS — R6889 Other general symptoms and signs: Secondary | ICD-10-CM

## 2017-01-06 DIAGNOSIS — E782 Mixed hyperlipidemia: Secondary | ICD-10-CM | POA: Diagnosis not present

## 2017-01-06 DIAGNOSIS — I2510 Atherosclerotic heart disease of native coronary artery without angina pectoris: Secondary | ICD-10-CM

## 2017-01-06 DIAGNOSIS — E1121 Type 2 diabetes mellitus with diabetic nephropathy: Secondary | ICD-10-CM | POA: Diagnosis not present

## 2017-01-06 DIAGNOSIS — I10 Essential (primary) hypertension: Secondary | ICD-10-CM | POA: Diagnosis not present

## 2017-01-06 DIAGNOSIS — Z7901 Long term (current) use of anticoagulants: Secondary | ICD-10-CM

## 2017-01-06 DIAGNOSIS — Z951 Presence of aortocoronary bypass graft: Secondary | ICD-10-CM

## 2017-01-06 DIAGNOSIS — Z952 Presence of prosthetic heart valve: Secondary | ICD-10-CM

## 2017-01-06 DIAGNOSIS — E538 Deficiency of other specified B group vitamins: Secondary | ICD-10-CM

## 2017-01-06 DIAGNOSIS — Z0000 Encounter for general adult medical examination without abnormal findings: Secondary | ICD-10-CM

## 2017-01-06 DIAGNOSIS — Z6831 Body mass index (BMI) 31.0-31.9, adult: Secondary | ICD-10-CM

## 2017-01-06 DIAGNOSIS — I2583 Coronary atherosclerosis due to lipid rich plaque: Principal | ICD-10-CM

## 2017-01-06 LAB — CBC WITH DIFFERENTIAL/PLATELET
Basophils Absolute: 0 {cells}/uL (ref 0–200)
Basophils Relative: 0 %
Eosinophils Absolute: 118 {cells}/uL (ref 15–500)
Eosinophils Relative: 2 %
HCT: 44.4 % (ref 38.5–50.0)
Hemoglobin: 14.9 g/dL (ref 13.2–17.1)
Lymphocytes Relative: 22 %
Lymphs Abs: 1298 {cells}/uL (ref 850–3900)
MCH: 30.2 pg (ref 27.0–33.0)
MCHC: 33.6 g/dL (ref 32.0–36.0)
MCV: 89.9 fL (ref 80.0–100.0)
MPV: 11.7 fL (ref 7.5–12.5)
Monocytes Absolute: 531 {cells}/uL (ref 200–950)
Monocytes Relative: 9 %
Neutro Abs: 3953 {cells}/uL (ref 1500–7800)
Neutrophils Relative %: 67 %
Platelets: 161 10*3/uL (ref 140–400)
RBC: 4.94 MIL/uL (ref 4.20–5.80)
RDW: 14.8 % (ref 11.0–15.0)
WBC: 5.9 10*3/uL (ref 3.8–10.8)

## 2017-01-06 LAB — BASIC METABOLIC PANEL WITHOUT GFR
BUN: 9 mg/dL (ref 7–25)
CO2: 26 mmol/L (ref 20–31)
Calcium: 9.1 mg/dL (ref 8.6–10.3)
Chloride: 106 mmol/L (ref 98–110)
Creat: 0.77 mg/dL (ref 0.70–1.18)
GFR, Est African American: >89 mL/min
GFR, Est Non African American: 89 mL/min
Glucose, Bld: 113 mg/dL — ABNORMAL HIGH (ref 65–99)
Potassium: 4.4 mmol/L (ref 3.5–5.3)
Sodium: 141 mmol/L (ref 135–146)

## 2017-01-06 LAB — HEPATIC FUNCTION PANEL
ALT: 16 U/L (ref 9–46)
AST: 17 U/L (ref 10–35)
Albumin: 4.1 g/dL (ref 3.6–5.1)
Alkaline Phosphatase: 74 U/L (ref 40–115)
Bilirubin, Direct: 0.2 mg/dL
Indirect Bilirubin: 0.6 mg/dL (ref 0.2–1.2)
Total Bilirubin: 0.8 mg/dL (ref 0.2–1.2)
Total Protein: 6.5 g/dL (ref 6.1–8.1)

## 2017-01-06 LAB — URINALYSIS, ROUTINE W REFLEX MICROSCOPIC
Bilirubin Urine: NEGATIVE
Hgb urine dipstick: NEGATIVE
Ketones, ur: NEGATIVE
Leukocytes, UA: NEGATIVE
Nitrite: NEGATIVE
Protein, ur: NEGATIVE
Specific Gravity, Urine: 1.034 (ref 1.001–1.035)
pH: 5.5 (ref 5.0–8.0)

## 2017-01-06 LAB — URINALYSIS, MICROSCOPIC ONLY
Bacteria, UA: NONE SEEN [HPF]
Casts: NONE SEEN [LPF]
Crystals: NONE SEEN [HPF]
RBC / HPF: NONE SEEN RBC/HPF
Squamous Epithelial / HPF: NONE SEEN [HPF]
Yeast: NONE SEEN [HPF]

## 2017-01-06 LAB — LIPID PANEL
Cholesterol: 128 mg/dL
HDL: 33 mg/dL — ABNORMAL LOW
LDL Cholesterol: 75 mg/dL
Total CHOL/HDL Ratio: 3.9 ratio
Triglycerides: 102 mg/dL
VLDL: 20 mg/dL

## 2017-01-06 LAB — TSH: TSH: 1.59 m[IU]/L (ref 0.40–4.50)

## 2017-01-06 NOTE — Progress Notes (Signed)
MEDICARE ANNUAL WELLNESS VISIT AND FOLLOW UP Assessment:    1. 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 Debara Pickett -s/p CABG and AVR -continue statin LDL goal < 70 -cont diabetes control  2. Essential hypertension -cont meds -elevated today but on recheck was 134/78 on right forearm think cuff was too small -Billy Reilly will monitor at home -dash diet -needs more physical activity - TSH  3. Type II diabetes mellitus with nephropathy (HCC) -cont current medications -feet checks daily -cont to monitor CBGs at home -cont diet and add in exercise - Hemoglobin A1c  4. Mixed hyperlipidemia -cont statin -goal LDL < 70 - Lipid panel  5. Medication management  - CBC with Differential/Platelet - BASIC METABOLIC PANEL WITH GFR - Hepatic function panel  6. BMI 31.94,adult -recommended weight loss through diet and exercise -Billy Reilly wants to start walking again when weather improves  7. Encounter for Medicare annual wellness exam -due next year  8. Hx of CABG -due next year  64. Long term current use of anticoagulant therapy -followed by cardiology -cont coumadin  10. Morbid obesity ([place]) -cont diet and exercise  11. S/P AVR -long term anticoagulant use  12. Umbilical hernia without obstruction and without gangrene -patient aware to go to ER if bloody stools or severe abd pain -watchful waiting  13. Vitamin B12 deficiency -cont B complex vitamin  14. Vitamin D deficiency -cont Vit D supplement  15. Dysuria -urine recheck from recent infection - Urinalysis, Routine w reflex microscopic - Culture, Urine    Over 30 minutes of exam, counseling, chart review, and critical decision making was performed  Future Appointments Date Time Provider [place]  01/20/2017 8:45 AM [name], MD [facility]  01/20/2017 11:45 AM [facility]  04/07/2017 10:30 AM Unk Pinto, MD [facility] None  10/23/2017  10:00 AM Unk Pinto, MD [facility] None     Plan:   During the course of the visit the patient was educated and counseled about appropriate screening and preventive services including:    Pneumococcal vaccine   Influenza vaccine  Prevnar 13  Td vaccine  Screening electrocardiogram  Colorectal cancer screening  Diabetes screening  Glaucoma screening  Nutrition counseling    Subjective:  Billy Reilly is a 76 y.o. male who presents for Medicare Annual Wellness Visit and 3 month follow up for HTN, hyperlipidemia, prediabetes, and vitamin D Def.   His blood pressure has been controlled at home, today their BP is BP: (!) 156/82 Billy Reilly does not workout. Billy Reilly denies chest pain, shortness of breath, dizziness.  Billy Reilly is on cholesterol medication and denies myalgias. His cholesterol is at goal. The cholesterol last visit was:   Lab Results  Component Value Date   CHOL 117 10/03/2016   HDL 37 (L) 10/03/2016   LDLCALC 64 10/03/2016   TRIG 79 10/03/2016   CHOLHDL 3.2 10/03/2016   Billy Reilly has a  Long history of diabetes with CKD.  Billy Reilly does use oral medications for control.  Billy Reilly has had A1c between 7-8 in the last year.    Lab Results  Component Value Date   HGBA1C 7.0 (H) 10/03/2016   Billy Reilly reports that his blood sugars are staying around 165.  Billy Reilly reports that they are very consistent.  Billy Reilly never has low blood sugars.  Billy Reilly reports that Billy Reilly is not having vision problems, Billy Reilly has not had tingling or numbness in his feet.  Billy Reilly is checking feet regularly.    Last  GFR Lab Results  Component Value Date   GFRNONAA 88 10/03/2016     Lab Results  Component Value Date   GFRAA >89 10/03/2016   Patient is on Vitamin D supplement.   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  Billy Reilly reports that Billy Reilly is due to see Dr. Debara Pickett in 2 weeks.  Billy Reilly reports that Billy Reilly will need a letter with the last 3 A1c levels.    Billy Reilly reports that Billy Reilly recently had a UTI.   Billy Reilly was told to have a urine recheck today.  Billy Reilly is completely urinary symptom free per his report.     Medication Review: Current Outpatient Prescriptions on File Prior to Visit  Medication Sig Dispense Refill  . aspirin 81 MG tablet Take 81 mg by mouth daily.      . clotrimazole-betamethasone (LOTRISONE) cream APPLY TO AFFECTED AREA TWICE A DAY FOR 2 WEEKS 45 g 3  . cyanocobalamin (,VITAMIN B-12,) 1000 MCG/ML injection INJECT 1/2 ML EVERY MONTH 10 mL 2  . enalapril (VASOTEC) 20 MG tablet Take 2 tablets (40 mg total) by mouth daily. 180 tablet 3  . FARXIGA 10 MG TABS tablet TAKE 1 TABLET BY MOUTH EVERY DAY FOR DIABETES 30 tablet 2  . glipiZIDE (GLUCOTROL) 10 MG tablet TAKE 1 TABLET BY MOUTH 3 TIMES A DAY BEFORE MEALS 270 tablet 3  . glucose blood (ACCU-CHEK AVIVA PLUS) test strip Check blood sugar 1 time daily. DX-E11.9 100 each 12  . metFORMIN (GLUCOPHAGE-XR) 500 MG 24 hr tablet TAKE 2 TABLETS TWICE DAILY WITH A MEAL 360 tablet 1  . metoprolol succinate (TOPROL-XL) 50 MG 24 hr tablet TAKE 1 TABLET BY MOUTH EVERY DAY 90 tablet 3  . nystatin (NYSTATIN) powder APPLY TO AFFECTED AREA TWICE A DAY 60 g 3  . ranitidine (ZANTAC) 300 MG tablet TAKE 1 TO 2 TABLETS DAILY FOR HEARTBURN TO WEAN OFF OF PRILOSEC/OMEPRAZOLE 180 tablet 1  . simvastatin (ZOCOR) 40 MG tablet TAKE 1 TABLET BY MOUTH EVERY DAY 90 tablet 3  . warfarin (COUMADIN) 5 MG tablet TAKE 1 TABLET BY MOUTH DAILY OR AS DIRECTED 90 tablet 1   No current facility-administered medications on file prior to visit.     Allergies: No Known Allergies  Current Problems (verified) has Umbilical hernia; Long term current use of anticoagulant therapy; S/P AVR; Hyperlipidemia; Type II diabetes mellitus with nephropathy ([place]); Vitamin D deficiency; Vitamin B12 deficiency; Morbid obesity ([place]); Essential hypertension; Medication management; CAD (coronary artery disease); Encounter for Department of Transportation (DOT) examination for driving license  renewal; Hx of CABG; Encounter for Medicare annual wellness exam; and BMI 31.94,adult on his problem list.  Screening Tests Immunization History  Administered Date(s) Administered  . Influenza Whole 09/01/2012  . Influenza, High Dose Seasonal PF 09/27/2015  . Influenza-Unspecified 08/25/2016  . Pneumococcal Conjugate-13 12/18/2015  . Pneumococcal Polysaccharide-23 09/01/2012  . Tdap 11/25/2007    Preventative care: Last colonoscopy: 2011  Names of Other Physician/Practitioners you currently use: 1. [facility] here for primary care 2. Dr. Delman Cheadle, eye doctor, last visit 11/17 3. Dr. Johnnye Sima , dentist, last visit 2018 Patient Care Team: Unk Pinto, MD as PCP - General (Internal Medicine) [name], MD as Consulting Physician (Cardiology) Irine Seal, MD as Attending Physician (Urology) Laurence Spates, MD as Consulting Physician (Gastroenterology)  Surgical: Billy Reilly  has a past surgical history that includes Aortic  valve replacement (07/18/2003); Hernia repair (09/11/2011); Aortic valve replacement (avr)/coronary artery bypass grafting (cabg) (07/18/2003); Cardiac catheterization (07/13/2003); TEE without cardioversion (07/18/2003); transthoracic echocardiogram (02/21/2013); and NM MYOCAR PERF WALL MOTION (02/12/2010). Family His family history includes Cancer in his brother and sister; Heart attack in his father; Heart disease in his father; Stroke in his mother. Social history  Billy Reilly reports that Billy Reilly has never smoked. Billy Reilly has never used smokeless tobacco. Billy Reilly reports that Billy Reilly does not drink alcohol or use drugs.  MEDICARE WELLNESS OBJECTIVES: Physical activity: Current Exercise Habits: The patient does not participate in regular exercise at present Cardiac risk factors: Cardiac Risk Factors include: advanced age (>48men, >84 women);diabetes mellitus;dyslipidemia;hypertension;male gender;sedentary lifestyle;obesity (BMI >30kg/m2) Depression/mood  screen:   Depression screen [facility] 2/9 01/06/2017  Decreased Interest 0  Down, Depressed, Hopeless 0  PHQ - 2 Score 0    ADLs:  In your present state of health, do you have any difficulty performing the following activities: 01/06/2017 10/03/2016  Hearing? N N  Vision? N N  Difficulty concentrating or making decisions? N N  Walking or climbing stairs? N N  Dressing or bathing? N N  Doing errands, shopping? N N  Preparing Food and eating ? N -  Using the Toilet? N -  In the past six months, have you accidently leaked urine? N -  Do you have problems with loss of bowel control? N -  Managing your Medications? N -  Managing your Finances? N -  Housekeeping or managing your Housekeeping? N -  Some recent data might be hidden     Cognitive Testing  Alert? Yes  Normal Appearance?Yes  Oriented to person? Yes  Place? Yes   Time? Yes  Recall of three objects?  Yes  Can perform simple calculations? Yes  Displays appropriate judgment?Yes  Can read the correct time from a watch face?Yes  EOL planning: Does Patient Have a Medical Advance Directive?: Yes Type of Advance Directive: Living will   Objective:   Today's Vitals   01/06/17 1014  BP: (!) 156/82  Pulse: (!) 58  Resp: 16  Temp: 98.2 F (36.8 C)  TempSrc: Temporal  Weight: 220 lb (99.8 kg)  Height: 5\' 9"  (1.753 m)   Body mass index is 32.49 kg/m.  General appearance: Obese, alert, no distress, WD/WN, male HEENT: normocephalic, sclerae anicteric, TMs pearly, nares patent, no discharge or erythema, pharynx normal Oral cavity: MMM, no lesions Neck: supple, no lymphadenopathy, no thyromegaly, no masses Heart: Murmur heard best on the right second ICS with click.  No rubs or gallops Lungs: CTA bilaterally, no wheezes, rhonchi, or rales Abdomen: +bs, soft, non tender, non distended, no masses, no hepatomegaly, no splenomegaly Musculoskeletal: nontender, no swelling, no obvious deformity Extremities: no edema, no cyanosis, no  clubbing Pulses: 2+ symmetric, upper and lower extremities, normal cap refill Neurological: alert, oriented x 3, CN2-12 intact, strength normal upper extremities and lower extremities, sensation normal throughout, DTRs 2+ throughout, no cerebellar signs, gait normal Psychiatric: normal affect, behavior normal, pleasant   Medicare Attestation I have personally reviewed: The patient's medical and social history Their use of alcohol, tobacco or illicit drugs Their current medications and supplements The patient's functional ability including ADLs,fall risks, home safety risks, cognitive, and hearing and visual impairment Diet and physical activities Evidence for depression or mood disorders  The patient's weight, height, BMI, and visual acuity have been recorded in the chart.  I have made referrals, counseling, and provided education to the patient based on review of  the above and I have provided the patient with a written personalized care plan for preventive services.     Starlyn Skeans, PA-C   01/06/2017

## 2017-01-07 LAB — HEMOGLOBIN A1C
Hgb A1c MFr Bld: 7.7 % — ABNORMAL HIGH
Mean Plasma Glucose: 174 mg/dL

## 2017-01-07 LAB — URINE CULTURE: Organism ID, Bacteria: NO GROWTH

## 2017-01-09 ENCOUNTER — Other Ambulatory Visit: Payer: Self-pay | Admitting: Internal Medicine

## 2017-01-20 ENCOUNTER — Ambulatory Visit (INDEPENDENT_AMBULATORY_CARE_PROVIDER_SITE_OTHER): Payer: PPO | Admitting: Pharmacist

## 2017-01-20 ENCOUNTER — Encounter: Payer: Self-pay | Admitting: Internal Medicine

## 2017-01-20 ENCOUNTER — Ambulatory Visit (INDEPENDENT_AMBULATORY_CARE_PROVIDER_SITE_OTHER): Payer: PPO | Admitting: Internal Medicine

## 2017-01-20 VITALS — BP 138/68 | HR 63 | Ht 69.0 in | Wt 220.0 lb

## 2017-01-20 DIAGNOSIS — E782 Mixed hyperlipidemia: Secondary | ICD-10-CM

## 2017-01-20 DIAGNOSIS — Z0289 Encounter for other administrative examinations: Secondary | ICD-10-CM | POA: Diagnosis not present

## 2017-01-20 DIAGNOSIS — Z952 Presence of prosthetic heart valve: Secondary | ICD-10-CM | POA: Diagnosis not present

## 2017-01-20 DIAGNOSIS — I2510 Atherosclerotic heart disease of native coronary artery without angina pectoris: Secondary | ICD-10-CM | POA: Diagnosis not present

## 2017-01-20 DIAGNOSIS — I2583 Coronary atherosclerosis due to lipid rich plaque: Secondary | ICD-10-CM | POA: Diagnosis not present

## 2017-01-20 DIAGNOSIS — Z7901 Long term (current) use of anticoagulants: Secondary | ICD-10-CM | POA: Diagnosis not present

## 2017-01-20 LAB — POCT INR: INR: 2.8

## 2017-01-20 MED ORDER — METOPROLOL SUCCINATE ER 50 MG PO TB24: 50.0000 mg | ORAL_TABLET | Freq: Every day | ORAL | 3 refills | Status: DC

## 2017-01-20 MED ORDER — ENALAPRIL MALEATE 20 MG PO TABS: 40.0000 mg | ORAL_TABLET | Freq: Every day | ORAL | 3 refills | Status: DC

## 2017-01-20 NOTE — Progress Notes (Signed)
OFFICE NOTE  Chief Complaint:  No complaints  Primary Care Physician: Alesia Richards, MD  HPI:  Billy Reilly is a 76 year old gentleman, previously followed by Dr. Rex Kras, with a history of coronary disease. In 2004, he had CABG and replacement of his aortic valve with a mechanical prosthesis. He had 2-vessel bypass at the time with saphenous vein graft to the first diagonal and saphenous vein graft to the posterior descending by Dr. Prescott Gum, and replacement of the aortic valve with a 22 mm ATS AP valve. He has done well with valve replacement to this point without any recurrent angina, shortness of breath, palpitations, presyncope, or syncopal symptoms. INR checked today in the office is 2.6 and it has been stable on Coumadin without dose adjustments for at least a year. 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 Recently he was started on Farxiga, which is resulted in excellent weight loss as well as control of his blood sugars.  Overall he feels quite well.  I saw Billy Reilly back today in the office. He is here for DOT visit. When I last saw him he was doing well denies any chest pain or shortness of breath. That holds true today. His warfarin levels have been well controlled his INR was 3.1 today. He has not had stress testing in over 5 years and his bypass was in 2004. 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  Billy Reilly returns today for follow-up. I last saw him 6 months ago for DOT physical. At that time we performed a nuclear stress test which was negative for ischemia. 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 He denies any chest pain or worsening shortness of breath. Blood pressures been well controlled.  I saw Billy Reilly back today in the office for follow-up. He returns for a DOT letter. He continues to do well after bypass surgery  mechanical valve replacement. His INRs have been therapeutic. He is completely asymptomatic. 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 He had a stress test last year which was negative for ischemia. He is due for repeat echocardiogram to reassess his valve gradients this year.  01/20/2017  Billy Reilly was seen today in follow-up. He is due for renewal of his DOT physical. From a cardiac standpoint he seems stable. Denies a chest pain or worsening shortness of breath. An echocardiogram last year which showed a stable aortic valve gradient. 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 He is on warfarin and INR today was 2.8. EKG shows sinus rhythm.  PMHx:  Past Medical History:  Diagnosis Date  . Anemia   . AS (aortic stenosis)    valve replacement-st. jude mechanical valve  . ASHD (arteriosclerotic heart disease)   . CAD (coronary artery disease), native coronary artery    CABG 07/18/2003  . Cancer [facility])    prostate  . GERD (gastroesophageal reflux disease)   . History of nuclear stress test 02/12/10   EF 67%, normal perfusion  . Hyperlipidemia   . Hypertension   . Morbid obesity ([place])   . Type II or unspecified type diabetes mellitus without mention of complication, not stated as uncontrolled   . Umbilical hernia   . Vitamin B12 deficiency   . Vitamin D deficiency  Past Surgical History:  Procedure Laterality Date  . AORTIC VALVE REPLACEMENT  07/18/2003   44mm ATS-AP valve  . AORTIC VALVE REPLACEMENT (AVR)/CORONARY ARTERY BYPASS GRAFTING (CABG)  07/18/2003   AVR; bypass x 2-SVG to first diag, SVG to posterior descending  . CARDIAC CATHETERIZATION  07/13/2003   AS-aortic valve gradient 55mmhg, valve area 1.1; obstruction of ostium of posterior lateral branch with obstruction in the ostium of first diagnonal  . HERNIA REPAIR  41/96/2229   RIH, umbilical  . NM MYOCAR PERF WALL MOTION  02/12/2010    protocol:Bruce, EF 67%, exercise cap 7 METS, low risk scan  . TEE WITHOUT CARDIOVERSION  07/18/2003   no evidence of PE, Aortic valve heavily calcified,   . TRANSTHORACIC ECHOCARDIOGRAM  02/21/2013   EF60-65%, Septal motion showed paradox. atrium moderately dilated    FAMHx:  Family History  Problem Relation Age of Onset  . Stroke Mother   . Heart attack Father   . Heart disease Father   . Cancer Brother   . Cancer Sister     SOCHx:   reports that he has never smoked. He has never used smokeless tobacco. He reports that he does not drink alcohol or use drugs.  ALLERGIES:  No Known Allergies  ROS: A comprehensive review of systems was negative.  HOME MEDS: Current Outpatient Prescriptions  Medication Sig Dispense Refill  . aspirin 81 MG tablet Take 81 mg by mouth daily.      . clotrimazole-betamethasone (LOTRISONE) cream APPLY TO AFFECTED AREA TWICE A DAY FOR 2 WEEKS 45 g 3  . cyanocobalamin (,VITAMIN B-12,) 1000 MCG/ML injection INJECT 1/2 ML EVERY MONTH 10 mL 2  . enalapril (VASOTEC) 20 MG tablet Take 2 tablets (40 mg total) by mouth daily. 180 tablet 3  . FARXIGA 10 MG TABS tablet TAKE 1 TABLET BY MOUTH EVERY DAY FOR DIABETES 30 tablet 2  . glipiZIDE (GLUCOTROL) 10 MG tablet TAKE 1 TABLET BY MOUTH 3 TIMES A DAY BEFORE MEALS 270 tablet 3  . glucose blood (ACCU-CHEK AVIVA PLUS) test strip Check blood sugar 1 time daily. DX-E11.9 100 each 12  . metFORMIN (GLUCOPHAGE-XR) 500 MG 24 hr tablet TAKE 2 TABLETS TWICE DAILY WITH A MEAL 360 tablet 1  . metoprolol succinate (TOPROL-XL) 50 MG 24 hr tablet Take 1 tablet (50 mg total) by mouth daily. Take with or immediately following a meal. 90 tablet 3  . nystatin (NYSTATIN) powder APPLY TO AFFECTED AREA TWICE A DAY 60 g 3  . ranitidine (ZANTAC) 300 MG tablet TAKE 1 TO 2 TABLETS DAILY FOR HEARTBURN TO WEAN OFF OF PRILOSEC/OMEPRAZOLE 180 tablet 1  . simvastatin (ZOCOR) 40 MG tablet TAKE 1 TABLET BY MOUTH EVERY DAY 90 tablet 3  . warfarin  (COUMADIN) 5 MG tablet TAKE 1 TABLET BY MOUTH DAILY OR AS DIRECTED 90 tablet 1   No current facility-administered medications for this visit.     LABS/IMAGING: No results found for this or any previous visit (from the past 48 hour(s)). No results found.  VITALS: BP 138/68   Pulse 63   Ht 5\' 9"  (1.753 m)   Wt 220 lb (99.8 kg)   BMI 32.49 kg/m   EXAM: General appearance: alert and no distress Neck: no carotid bruit, no JVD and thyroid not enlarged, symmetric, no tenderness/mass/nodules Lungs: clear to auscultation bilaterally Heart: regular rate and rhythm, S1, S2 normal, no murmur, click, rub or gallop Abdomen: soft, non-tender; bowel sounds normal; no masses,  no organomegaly Extremities: extremities normal,  atraumatic, no cyanosis or edema Pulses: 2+ and symmetric Skin: Skin color, texture, turgor normal. No rashes or lesions Neurologic: Grossly normal Psych: Mood, affect normal  EKG: Normal sinus rhythm with sinus arrhythmia at 63  ASSESSMENT: 1. CAD status post 2 vessel CABG (SVG to diagonal SVG to PDA) in 2004 2. Status post ATS mechanical aortic valve 3. Lifetime anticoagulation on warfarin 4. HTN 5. Dyslipidemia 6. DM2  PLAN: 1.   Billy Reilly is doing well without any new cardiac problems. INR is therapeutic on warfarin. Blood pressure is well-controlled. His cholesterol was recently assessed and is at goal. He will need a repeat echocardiogram in one year for continued follow-up of his mechanical aortic valve. 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  Plan to see him back annually or sooner as necessary.  [name], MD, [facility] Attending Cardiologist [place] [facility] 01/20/2017, 10:13 AM

## 2017-01-20 NOTE — Patient Instructions (Signed)
Your physician wants you to follow-up in: ONE YEAR with Dr. Debara Pickett - after echocardiogram. You will receive a reminder letter in the mail two months in advance. If you don't receive a letter, please call our office to schedule the follow-up appointment.  Your physician has requested that you have an echocardiogram in 1 year - [address] - [address]rd [address]. Echocardiography is a painless test that uses sound waves to create images of your heart. It provides your doctor with information about the size and shape of your heart and how well your heart's chambers and valves are working. This procedure takes approximately one hour. There are no restrictions for this procedure.

## 2017-01-21 ENCOUNTER — Other Ambulatory Visit: Payer: Self-pay | Admitting: Internal Medicine

## 2017-02-04 ENCOUNTER — Other Ambulatory Visit: Payer: Self-pay | Admitting: Internal Medicine

## 2017-02-17 ENCOUNTER — Ambulatory Visit (INDEPENDENT_AMBULATORY_CARE_PROVIDER_SITE_OTHER): Payer: PPO | Admitting: Pharmacist

## 2017-02-17 DIAGNOSIS — Z952 Presence of prosthetic heart valve: Secondary | ICD-10-CM

## 2017-02-17 DIAGNOSIS — Z7901 Long term (current) use of anticoagulants: Secondary | ICD-10-CM | POA: Diagnosis not present

## 2017-02-17 LAB — POCT INR: INR: 3.5

## 2017-02-25 ENCOUNTER — Other Ambulatory Visit: Payer: Self-pay | Admitting: Internal Medicine

## 2017-03-03 ENCOUNTER — Other Ambulatory Visit: Payer: Self-pay | Admitting: Internal Medicine

## 2017-03-17 ENCOUNTER — Ambulatory Visit (INDEPENDENT_AMBULATORY_CARE_PROVIDER_SITE_OTHER): Payer: PPO | Admitting: Pharmacist

## 2017-03-17 DIAGNOSIS — Z7901 Long term (current) use of anticoagulants: Secondary | ICD-10-CM | POA: Diagnosis not present

## 2017-03-17 DIAGNOSIS — Z952 Presence of prosthetic heart valve: Secondary | ICD-10-CM

## 2017-03-17 LAB — POCT INR: INR: 3

## 2017-04-07 ENCOUNTER — Other Ambulatory Visit: Payer: Self-pay | Admitting: Internal Medicine

## 2017-04-07 ENCOUNTER — Ambulatory Visit (INDEPENDENT_AMBULATORY_CARE_PROVIDER_SITE_OTHER): Payer: PPO | Admitting: Internal Medicine

## 2017-04-07 ENCOUNTER — Encounter: Payer: Self-pay | Admitting: Internal Medicine

## 2017-04-07 VITALS — BP 132/70 | HR 76 | Temp 97.8°F | Resp 16 | Ht 69.0 in | Wt 216.6 lb

## 2017-04-07 DIAGNOSIS — E1121 Type 2 diabetes mellitus with diabetic nephropathy: Secondary | ICD-10-CM | POA: Diagnosis not present

## 2017-04-07 DIAGNOSIS — Z79899 Other long term (current) drug therapy: Secondary | ICD-10-CM | POA: Diagnosis not present

## 2017-04-07 DIAGNOSIS — E559 Vitamin D deficiency, unspecified: Secondary | ICD-10-CM | POA: Diagnosis not present

## 2017-04-07 DIAGNOSIS — E782 Mixed hyperlipidemia: Secondary | ICD-10-CM | POA: Diagnosis not present

## 2017-04-07 DIAGNOSIS — B351 Tinea unguium: Secondary | ICD-10-CM

## 2017-04-07 DIAGNOSIS — I10 Essential (primary) hypertension: Secondary | ICD-10-CM

## 2017-04-07 LAB — CBC WITH DIFFERENTIAL/PLATELET
Basophils Absolute: 50 {cells}/uL (ref 0–200)
Basophils Relative: 1 %
Eosinophils Absolute: 100 {cells}/uL (ref 15–500)
Eosinophils Relative: 2 %
HCT: 45.2 % (ref 38.5–50.0)
Hemoglobin: 15 g/dL (ref 13.2–17.1)
Lymphocytes Relative: 21 %
Lymphs Abs: 1050 {cells}/uL (ref 850–3900)
MCH: 29.7 pg (ref 27.0–33.0)
MCHC: 33.2 g/dL (ref 32.0–36.0)
MCV: 89.5 fL (ref 80.0–100.0)
MPV: 11.2 fL (ref 7.5–12.5)
Monocytes Absolute: 500 {cells}/uL (ref 200–950)
Monocytes Relative: 10 %
Neutro Abs: 3300 {cells}/uL (ref 1500–7800)
Neutrophils Relative %: 66 %
Platelets: 174 10*3/uL (ref 140–400)
RBC: 5.05 MIL/uL (ref 4.20–5.80)
RDW: 15.2 % — ABNORMAL HIGH (ref 11.0–15.0)
WBC: 5 10*3/uL (ref 3.8–10.8)

## 2017-04-07 LAB — HEPATIC FUNCTION PANEL
ALT: 15 U/L (ref 9–46)
AST: 15 U/L (ref 10–35)
Albumin: 4.2 g/dL (ref 3.6–5.1)
Alkaline Phosphatase: 82 U/L (ref 40–115)
Bilirubin, Direct: 0.2 mg/dL
Indirect Bilirubin: 0.8 mg/dL (ref 0.2–1.2)
Total Bilirubin: 1 mg/dL (ref 0.2–1.2)
Total Protein: 6.7 g/dL (ref 6.1–8.1)

## 2017-04-07 LAB — BASIC METABOLIC PANEL WITHOUT GFR
BUN: 14 mg/dL (ref 7–25)
CO2: 19 mmol/L — ABNORMAL LOW (ref 20–31)
Calcium: 9 mg/dL (ref 8.6–10.3)
Chloride: 104 mmol/L (ref 98–110)
Creat: 0.91 mg/dL (ref 0.70–1.18)
GFR, Est African American: >89 mL/min
GFR, Est Non African American: 83 mL/min
Glucose, Bld: 254 mg/dL — ABNORMAL HIGH (ref 65–99)
Potassium: 4.7 mmol/L (ref 3.5–5.3)
Sodium: 136 mmol/L (ref 135–146)

## 2017-04-07 LAB — LIPID PANEL
Cholesterol: 128 mg/dL
HDL: 34 mg/dL — ABNORMAL LOW
LDL Cholesterol: 74 mg/dL
Total CHOL/HDL Ratio: 3.8 ratio
Triglycerides: 101 mg/dL
VLDL: 20 mg/dL

## 2017-04-07 LAB — TSH: TSH: 1.43 m[IU]/L (ref 0.40–4.50)

## 2017-04-07 LAB — MAGNESIUM: Magnesium: 2 mg/dL (ref 1.5–2.5)

## 2017-04-07 MED ORDER — TERBINAFINE HCL 250 MG PO TABS: ORAL_TABLET | ORAL | 0 refills | Status: AC

## 2017-04-07 NOTE — Progress Notes (Signed)
This very nice 76 y.o. presents for 6 month follow up with Hypertension, ASHD/CABG/AoVR,  Hyperlipidemia, Pre-Diabetes and Vitamin D Deficiency.      Patient is treated for HTN (1994) & BP has been controlled at home. Today's BP is at goal - 132/70.  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 He had a negative Nuclear stress test in Mar 2016 per Dr Debara Pickett. Patient has had no complaints of any cardiac type chest pain, palpitations, dyspnea/orthopnea/PND, dizziness, claudication, or dependent edema.     Hyperlipidemia is controlled with diet & meds. Patient denies myalgias or other med SE's. 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 He denies  symptoms of reactive hypoglycemia, diabetic polys, paresthesias or visual blurring.  He reports CBK's range up to 150's and last A1c was not at goal: Lab Results  Component Value Date   HGBA1C 7.7 (H) 01/06/2017      Further, the patient also has history of Vitamin D Deficiency ("28" in 2012) and supplements vitamin D without any suspected side-effects. Last vitamin D was still low: Lab Results  Component Value Date   VD25OH 33 10/03/2016   Current Outpatient Prescriptions on File Prior to Visit  Medication Sig  . aspirin 81 MG tablet Take 81 mg by mouth daily.    . clotrimazole-betamethasone (LOTRISONE) cream APPLY TO AFFECTED AREA TWICE A DAY FOR 2 WEEKS  . cyanocobalamin (,VITAMIN B-12,) 1000 MCG/ML injection INJECT 1/2 ML EVERY MONTH  . enalapril (VASOTEC) 20 MG tablet Take 2 tablets (40 mg total) by mouth daily.  Marland Kitchen FARXIGA 10 MG TABS tablet TAKE 1 TABLET BY MOUTH EVERY DAY FOR DIABETES  . glipiZIDE (GLUCOTROL) 10 MG tablet TAKE 1 TABLET BY MOUTH 3 TIMES A DAY BEFORE MEALS  . glucose blood (ACCU-CHEK AVIVA PLUS) test strip Check blood sugar 1  time daily. DX-E11.9  . metFORMIN (GLUCOPHAGE-XR) 500 MG 24 hr tablet TAKE 2 TABLETS BY MOUTH TWICE A DAY WITH A MEAL  . metoprolol succinate (TOPROL-XL) 50 MG 24 hr tablet Take 1 tablet (50 mg total) by mouth daily. Take with or immediately following a meal.  . nystatin (NYSTATIN) powder APPLY TO AFFECTED AREA TWICE A DAY  . ranitidine (ZANTAC) 300 MG tablet TAKE 1 TO 2 TABLETS BY MOUTH EVERY DAY FOR HEARTBURN TO WEAN OFF PRILOSEC/OMEPRAZOLE  . simvastatin (ZOCOR) 40 MG tablet TAKE 1 TABLET BY MOUTH EVERY DAY  . warfarin (COUMADIN) 5 MG tablet TAKE 1 TABLET BY MOUTH DAILY OR AS DIRECTED   No current facility-administered medications on file prior to visit.    No Known Allergies PMHx:   Past Medical History:  Diagnosis Date  . Anemia   . AS (aortic stenosis)    valve replacement-st. jude mechanical valve  . ASHD (arteriosclerotic heart disease)   . CAD (coronary artery disease), native coronary artery    CABG 07/18/2003  . Cancer [facility])    prostate  . GERD (gastroesophageal reflux disease)   . History of nuclear stress test 02/12/10   EF 67%, normal perfusion  . Hyperlipidemia   . Hypertension   . Morbid obesity ([place])   . Type II or unspecified type diabetes mellitus without mention of complication, not stated as uncontrolled   .  Umbilical hernia   . Vitamin B12 deficiency   . Vitamin D deficiency    Immunization History  Administered Date(s) Administered  . Influenza Whole 09/01/2012  . Influenza, High Dose Seasonal PF 09/27/2015  . Influenza-Unspecified 08/25/2016  . Pneumococcal Conjugate-13 12/18/2015  . Pneumococcal Polysaccharide-23 09/01/2012  . Tdap 11/25/2007   Past Surgical History:  Procedure Laterality Date  . AORTIC VALVE REPLACEMENT  07/18/2003   56mm ATS-AP valve  . AORTIC VALVE REPLACEMENT (AVR)/CORONARY ARTERY BYPASS GRAFTING (CABG)  07/18/2003   AVR; bypass x 2-SVG to first diag, SVG to posterior descending  . CARDIAC CATHETERIZATION  07/13/2003    AS-aortic valve gradient 73mmhg, valve area 1.1; obstruction of ostium of posterior lateral branch with obstruction in the ostium of first diagnonal  . HERNIA REPAIR  46/27/0350   RIH, umbilical  . NM MYOCAR PERF WALL MOTION  02/12/2010   protocol:Bruce, EF 67%, exercise cap 7 METS, low risk scan  . TEE WITHOUT CARDIOVERSION  07/18/2003   no evidence of PE, Aortic valve heavily calcified,   . TRANSTHORACIC ECHOCARDIOGRAM  02/21/2013   EF60-65%, Septal motion showed paradox. atrium moderately dilated   FHx:    Reviewed / unchanged  SHx:    Reviewed / unchanged  Systems Review:  Constitutional: Denies fever, chills, wt changes, headaches, insomnia, fatigue, night sweats, change in appetite. Eyes: Denies redness, blurred vision, diplopia, discharge, itchy, watery eyes.  ENT: Denies discharge, congestion, post nasal drip, epistaxis, sore throat, earache, hearing loss, dental pain, tinnitus, vertigo, sinus pain, snoring.  CV: Denies chest pain, palpitations, irregular heartbeat, syncope, dyspnea, diaphoresis, orthopnea, PND, claudication or edema. Respiratory: denies cough, dyspnea, DOE, pleurisy, hoarseness, laryngitis, wheezing.  Gastrointestinal: Denies dysphagia, odynophagia, heartburn, reflux, water brash, abdominal pain or cramps, nausea, vomiting, bloating, diarrhea, constipation, hematemesis, melena, hematochezia  or hemorrhoids. Genitourinary: Denies dysuria, frequency, urgency, nocturia, hesitancy, discharge, hematuria or flank pain. Musculoskeletal: Denies arthralgias, myalgias, stiffness, jt. swelling, pain, limping or strain/sprain.  Skin: Denies pruritus, rash, hives, warts, acne, eczema or change in skin lesion(s). Neuro: No weakness, tremor, incoordination, spasms, paresthesia or pain. Psychiatric: Denies confusion, memory loss or sensory loss. Endo: Denies change in weight, skin or hair change.  Heme/Lymph: No excessive bleeding, bruising or enlarged lymph nodes.  Physical  Exam  BP 132/70   Pulse 76   Temp 97.8 F (36.6 C)   Resp 16   Ht 5\' 9"  (1.753 m)   Wt 216 lb 9.6 oz (98.2 kg)   BMI 31.99 kg/m   Appears well nourished, well groomed  and in no distress.  Eyes: PERRLA, EOMs, conjunctiva no swelling or erythema. Sinuses: No frontal/maxillary tenderness ENT/Mouth: EAC's clear, TM's nl w/o erythema, bulging. Nares clear w/o erythema, swelling, exudates. Oropharynx clear without erythema or exudates. Oral hygiene is good. Tongue normal, non obstructing. Hearing intact.  Neck: Supple. Thyroid nl. Car 2+/2+ without bruits, nodes or JVD. Chest: Respirations nl with BS clear & equal w/o rales, rhonchi, wheezing or stridor.  Cor: Heart sounds normal w/ regular rate and rhythm without sig. murmurs, gallops, clicks or rubs. Peripheral pulses normal and equal  without edema.  Abdomen: Soft & bowel sounds normal. Non-tender w/o guarding, rebound, hernias, masses or organomegaly.  Lymphatics: Unremarkable.  Musculoskeletal: Full ROM all peripheral extremities, joint stability, 5/5 strength and normal gait.  Skin: Warm, dry without exposed rashes, lesions or ecchymosis apparent.  Neuro: Cranial nerves intact, reflexes equal bilaterally. Sensory-motor testing grossly intact. Tendon reflexes grossly intact.  Pysch: Alert & oriented x 3.  Insight and judgement nl & appropriate. No ideations.  Assessment and Plan:  1. Essential hypertension  - Continue medication, monitor blood pressure at home.  - Continue DASH diet. Reminder to go to the ER if any CP,  SOB, nausea, dizziness, severe HA, changes vision/speech,  left arm numbness and tingling and jaw pain.  - CBC with Differential/Platelet - BASIC METABOLIC PANEL WITH GFR - Magnesium - TSH  2. Hyperlipidemia, mixed  - Continue diet/meds, exercise,& lifestyle modifications.  - Continue monitor periodic cholesterol/liver & renal functions   - Hepatic function panel - Lipid panel - TSH  3. Type II  diabetes mellitus with nephropathy (HCC)  - Continue diet, exercise, lifestyle modifications.  - Monitor appropriate labs.  - Hemoglobin A1c - Insulin, random  4. Vitamin D deficiency  - Continue supplementation.  - VITAMIN D 25 Hydroxy   5. Medication management  - CBC with Differential/Platelet - BASIC METABOLIC PANEL WITH GFR - Hepatic function panel - Magnesium - Lipid panel - TSH - Hemoglobin A1c - Insulin, random - VITAMIN D 25 Hydroxy        Discussed  regular exercise, BP monitoring, weight control to achieve/maintain BMI less than 25 and discussed med and SE's. Recommended labs to assess and monitor clinical status with further disposition pending results of labs. Over 30 minutes of exam, counseling, chart review was performed.

## 2017-04-07 NOTE — Patient Instructions (Signed)

## 2017-04-08 LAB — VITAMIN D 25 HYDROXY (VIT D DEFICIENCY, FRACTURES): Vit D, 25-Hydroxy: 27 ng/mL — ABNORMAL LOW (ref 30–100)

## 2017-04-08 LAB — INSULIN, RANDOM: Insulin: 16.7 u[IU]/mL (ref 2.0–19.6)

## 2017-04-08 LAB — HEMOGLOBIN A1C
Hgb A1c MFr Bld: 8.2 % — ABNORMAL HIGH
Mean Plasma Glucose: 189 mg/dL

## 2017-04-21 ENCOUNTER — Ambulatory Visit (INDEPENDENT_AMBULATORY_CARE_PROVIDER_SITE_OTHER): Payer: PPO | Admitting: Pharmacist

## 2017-04-21 DIAGNOSIS — Z952 Presence of prosthetic heart valve: Secondary | ICD-10-CM | POA: Diagnosis not present

## 2017-04-21 DIAGNOSIS — Z7901 Long term (current) use of anticoagulants: Secondary | ICD-10-CM | POA: Diagnosis not present

## 2017-04-21 LAB — POCT INR: INR: 2.9

## 2017-04-24 ENCOUNTER — Other Ambulatory Visit: Payer: Self-pay | Admitting: Internal Medicine

## 2017-04-24 DIAGNOSIS — B351 Tinea unguium: Secondary | ICD-10-CM

## 2017-04-24 DIAGNOSIS — Z79899 Other long term (current) drug therapy: Secondary | ICD-10-CM

## 2017-05-19 ENCOUNTER — Ambulatory Visit (INDEPENDENT_AMBULATORY_CARE_PROVIDER_SITE_OTHER): Payer: PPO | Admitting: Pharmacist Clinician (PhC)/ Clinical Pharmacy Specialist

## 2017-05-19 DIAGNOSIS — Z7901 Long term (current) use of anticoagulants: Secondary | ICD-10-CM | POA: Diagnosis not present

## 2017-05-19 DIAGNOSIS — Z952 Presence of prosthetic heart valve: Secondary | ICD-10-CM

## 2017-05-19 LAB — POCT INR: INR: 2.9

## 2017-05-28 ENCOUNTER — Other Ambulatory Visit: Payer: PPO

## 2017-05-28 DIAGNOSIS — Z79899 Other long term (current) drug therapy: Secondary | ICD-10-CM | POA: Diagnosis not present

## 2017-05-28 DIAGNOSIS — B351 Tinea unguium: Secondary | ICD-10-CM | POA: Diagnosis not present

## 2017-05-28 LAB — HEPATIC FUNCTION PANEL
ALT: 16 U/L (ref 9–46)
AST: 15 U/L (ref 10–35)
Albumin: 4.3 g/dL (ref 3.6–5.1)
Alkaline Phosphatase: 88 U/L (ref 40–115)
Bilirubin, Direct: 0.2 mg/dL
Indirect Bilirubin: 0.6 mg/dL (ref 0.2–1.2)
Total Bilirubin: 0.8 mg/dL (ref 0.2–1.2)
Total Protein: 6.6 g/dL (ref 6.1–8.1)

## 2017-06-23 ENCOUNTER — Ambulatory Visit (INDEPENDENT_AMBULATORY_CARE_PROVIDER_SITE_OTHER): Payer: PPO | Admitting: Pharmacist

## 2017-06-23 DIAGNOSIS — Z7901 Long term (current) use of anticoagulants: Secondary | ICD-10-CM

## 2017-06-23 DIAGNOSIS — Z952 Presence of prosthetic heart valve: Secondary | ICD-10-CM | POA: Diagnosis not present

## 2017-06-23 LAB — POCT INR: INR: 2.6

## 2017-07-13 NOTE — Progress Notes (Signed)
Assessment and Plan:  Essential hypertension - continue medications, DASH diet, exercise and monitor at home. Call if greater than 130/80.  -     CBC with Differential/Platelet -     BASIC METABOLIC PANEL WITH GFR -     Hepatic function panel -     TSH  Type II diabetes mellitus with nephropathy ([place]) Discussed general issues about diabetes pathophysiology and management., Educational material distributed., Suggested low cholesterol diet., Encouraged aerobic exercise., Discussed foot care., Reminded to get yearly retinal exam. -     BASIC METABOLIC PANEL WITH GFR -     Hemoglobin A1c  Morbid obesity, unspecified obesity type ([place]) Obesity with co morbidities- long discussion about weight loss, diet, and exercise  Medication management Continue supplement  Hyperlipidemia -continue medications, check lipids, decrease fatty foods, increase activity.  -     Lipid panel  Continue diet and meds as discussed. Further disposition pending results of labs. Discussed med's effects and SE's.   HPI 76 y.o. male  presents for 3 month follow up with hypertension, hyperlipidemia, diabetes and vitamin D. His blood pressure has been controlled at home, today their BP is BP: 130/76 He does not workout. He denies chest pain, shortness of breath, dizziness.  He has history of AVR and follows with coumadin clinic once a month. He has a history of CAD s/p CABG, follows with cardio.  He is on cholesterol medication, simvastatin 40mg  and denies myalgias. His cholesterol is at goal. The cholesterol was:  04/07/2017: Cholesterol 128; HDL 34; LDL Cholesterol 74; Triglycerides 101 He has been working on diet and exercise for Diabetes with diabetic nephropathy and with other circulatory complications but last time kidney function was better with stopping the HCTZ, he is on bASA, he is on ACE/ARB,he is on cinnamon 500 BID, farxiga 10mg  went up to a full pill last visit, metformin 500mg  x 4, glipizide 10mg  3 x a day,  Does not want to get on insulin because still driving, checking sugars 1-2 x a week in the AM, running 160's and denies polydipsia, polyuria and visual disturbances. Last A1C was: 04/07/2017: Hgb A1c MFr Bld 8.2  Lab Results  Component Value Date   GFRNONAA 83 04/07/2017  Patient is not on Vitamin D supplement, he can not remember it. 04/07/2017: Vit D, 25-Hydroxy 27  BMI is Body mass index is 31.16 kg/m., he is working on diet and exercise. Wt Readings from Last 3 Encounters:  07/14/17 211 lb (95.7 kg)  04/07/17 216 lb 9.6 oz (98.2 kg)  01/20/17 220 lb (99.8 kg)    Current Medications:  Current Outpatient Prescriptions on File Prior to Visit  Medication Sig Dispense Refill  . aspirin 81 MG tablet Take 81 mg by mouth daily.      . clotrimazole-betamethasone (LOTRISONE) cream APPLY TO AFFECTED AREA TWICE A DAY FOR 2 WEEKS 45 g 3  . cyanocobalamin (,VITAMIN B-12,) 1000 MCG/ML injection INJECT 1/2 ML EVERY MONTH 10 mL 2  . enalapril (VASOTEC) 20 MG tablet Take 2 tablets (40 mg total) by mouth daily. 180 tablet 3  . FARXIGA 10 MG TABS tablet TAKE 1 TABLET BY MOUTH EVERY DAY FOR DIABETES 30 tablet 2  . glipiZIDE (GLUCOTROL) 10 MG tablet TAKE 1 TABLET BY MOUTH 3 TIMES A DAY BEFORE MEALS 270 tablet 3  . glucose blood (ACCU-CHEK AVIVA PLUS) test strip Check blood sugar 1 time daily. DX-E11.9 100 each 12  . metFORMIN (GLUCOPHAGE-XR) 500 MG 24 hr tablet TAKE 2 TABLETS BY  MOUTH TWICE A DAY WITH A MEAL 360 tablet 1  . metoprolol succinate (TOPROL-XL) 50 MG 24 hr tablet Take 1 tablet (50 mg total) by mouth daily. Take with or immediately following a meal. 90 tablet 3  . nystatin (NYSTATIN) powder APPLY TO AFFECTED AREA TWICE A DAY 60 g 3  . ranitidine (ZANTAC) 300 MG tablet TAKE 1 TO 2 TABLETS BY MOUTH EVERY DAY FOR HEARTBURN TO WEAN OFF PRILOSEC/OMEPRAZOLE 180 tablet 1  . simvastatin (ZOCOR) 40 MG tablet TAKE 1 TABLET BY MOUTH EVERY DAY 90 tablet 3  . warfarin (COUMADIN) 5 MG tablet TAKE 1 TABLET BY  MOUTH DAILY OR AS DIRECTED 90 tablet 1   No current facility-administered medications on file prior to visit.    Medical History:  Past Medical History:  Diagnosis Date  . Anemia   . AS (aortic stenosis)    valve replacement-st. jude mechanical valve  . ASHD (arteriosclerotic heart disease)   . CAD (coronary artery disease), native coronary artery    CABG 07/18/2003  . Cancer [facility])    prostate  . GERD (gastroesophageal reflux disease)   . History of nuclear stress test 02/12/10   EF 67%, normal perfusion  . Hyperlipidemia   . Hypertension   . Morbid obesity ([place])   . Type II or unspecified type diabetes mellitus without mention of complication, not stated as uncontrolled   . Umbilical hernia   . Vitamin B12 deficiency   . Vitamin D deficiency    Allergies: No Known Allergies   Review of Systems:  Review of Systems  Constitutional: Negative.   HENT: Negative.   Eyes: Negative.   Respiratory: Negative.   Cardiovascular: Negative.   Gastrointestinal: Negative.   Genitourinary: Negative for dysuria, flank pain, frequency, hematuria and urgency.  Musculoskeletal: Negative.   Skin: Negative.   Neurological: Negative.   Endo/Heme/Allergies: Negative.   Psychiatric/Behavioral: Negative.     Family history- Review and unchanged Social history- Review and unchanged Physical Exam: BP 130/76   Pulse 70   Temp 97.7 F (36.5 C)   Resp 16   Ht 5\' 9"  (1.753 m)   Wt 211 lb (95.7 kg)   SpO2 98%   BMI 31.16 kg/m  Wt Readings from Last 3 Encounters:  07/14/17 211 lb (95.7 kg)  04/07/17 216 lb 9.6 oz (98.2 kg)  01/20/17 220 lb (99.8 kg)   General Appearance: Well nourished, in no apparent distress. Eyes: PERRLA, EOMs, conjunctiva no swelling or erythema Sinuses: No Frontal/maxillary tenderness ENT/Mouth: Ext aud canals clear, TMs without erythema, bulging. No erythema, swelling, or exudate on post pharynx.  Tonsils not swollen or erythematous. Hearing normal.  Neck: Supple,  thyroid normal.  Respiratory: Respiratory effort normal, BS equal bilaterally without rales, rhonchi, wheezing or stridor.  Cardio: RRR with systolic click. Brisk peripheral pulses with 1+ edema.  Abdomen: Soft, + BS, obese  Non tender, no guarding, rebound, hernias, masses. Lymphatics: Non tender without lymphadenopathy.  Musculoskeletal: Full ROM, 5/5 strength, normal gait.  Skin: Warm, dry without rashes, lesions, ecchymosis.  Neuro: Cranial nerves intact. No cerebellar symptoms. Sensation intact.  Psych: Awake and oriented X 3, normal affect, Insight and Judgment appropriate.    Vicie Mutters, PA-C 11:05 AM [facility]

## 2017-07-14 ENCOUNTER — Encounter: Payer: Self-pay | Admitting: Physician Assistant

## 2017-07-14 ENCOUNTER — Ambulatory Visit (INDEPENDENT_AMBULATORY_CARE_PROVIDER_SITE_OTHER): Payer: PPO | Admitting: Physician Assistant

## 2017-07-14 VITALS — BP 130/76 | HR 70 | Temp 97.7°F | Resp 16 | Ht 69.0 in | Wt 211.0 lb

## 2017-07-14 DIAGNOSIS — E782 Mixed hyperlipidemia: Secondary | ICD-10-CM | POA: Diagnosis not present

## 2017-07-14 DIAGNOSIS — I10 Essential (primary) hypertension: Secondary | ICD-10-CM

## 2017-07-14 DIAGNOSIS — Z79899 Other long term (current) drug therapy: Secondary | ICD-10-CM | POA: Diagnosis not present

## 2017-07-14 DIAGNOSIS — E1121 Type 2 diabetes mellitus with diabetic nephropathy: Secondary | ICD-10-CM

## 2017-07-14 MED ORDER — TERBINAFINE HCL 250 MG PO TABS: ORAL_TABLET | ORAL | 0 refills | Status: DC

## 2017-07-14 NOTE — Patient Instructions (Signed)
Your A1C is a measure of your sugar over the past 3 months and is not affected by what you have eaten over the past few days. Diabetes increases your chances of stroke and heart attack over 300 % and is the leading cause of blindness and kidney failure in the [place]. Please make sure you decrease bad carbs like white bread, white rice, potatoes, corn, soft drinks, pasta, cereals, refined sugars, sweet tea, dried fruits, and fruit juice. Good carbs are okay to eat in moderation like sweet potatoes, brown rice, whole grain pasta/bread, most fruit (except dried fruit) and you can eat as many veggies as you want.   Greater than 6.5 is considered diabetic. Between 6.4 and 5.7 is prediabetic If your A1C is less than 5.7 you are NOT diabetic.  Targets for Glucose Readings: Time of Check Target for patients WITHOUT Diabetes Target for DIABETICS  Before Meals Less than 100  less than 150  Two hours after meals Less than 200  Less than 250       Bad carbs also include fruit juice, alcohol, and sweet tea. These are empty calories that do not signal to your brain that you are full.   Please remember the good carbs are still carbs which convert into sugar. So please measure them out no more than 1/2-1 cup of rice, oatmeal, pasta, and beans  Veggies are however free foods! Pile them on.   Not all fruit is created equal. Please see the list below, the fruit at the bottom is higher in sugars than the fruit at the top. Please avoid all dried fruits.

## 2017-07-15 LAB — BASIC METABOLIC PANEL WITHOUT GFR
BUN: 9 mg/dL (ref 7–25)
CO2: 25 mmol/L (ref 20–32)
Calcium: 9.1 mg/dL (ref 8.6–10.3)
Chloride: 103 mmol/L (ref 98–110)
Creat: 0.85 mg/dL (ref 0.70–1.18)
GFR, Est African American: 99 mL/min/{1.73_m2}
GFR, Est Non African American: 86 mL/min/{1.73_m2}
Glucose, Bld: 222 mg/dL — ABNORMAL HIGH (ref 65–99)
Potassium: 4.3 mmol/L (ref 3.5–5.3)
Sodium: 139 mmol/L (ref 135–146)

## 2017-07-15 LAB — CBC WITH DIFFERENTIAL/PLATELET
Basophils Absolute: 19 {cells}/uL (ref 0–200)
Basophils Relative: 0.3 %
Eosinophils Absolute: 70 {cells}/uL (ref 15–500)
Eosinophils Relative: 1.1 %
HCT: 44.8 % (ref 38.5–50.0)
Hemoglobin: 15.4 g/dL (ref 13.2–17.1)
Lymphs Abs: 1037 {cells}/uL (ref 850–3900)
MCH: 31.2 pg (ref 27.0–33.0)
MCHC: 34.4 g/dL (ref 32.0–36.0)
MCV: 90.7 fL (ref 80.0–100.0)
MPV: 12.1 fL (ref 7.5–12.5)
Monocytes Relative: 9.2 %
Neutro Abs: 4685 {cells}/uL (ref 1500–7800)
Neutrophils Relative %: 73.2 %
Platelets: 159 10*3/uL (ref 140–400)
RBC: 4.94 Million/uL (ref 4.20–5.80)
RDW: 13.9 % (ref 11.0–15.0)
Total Lymphocyte: 16.2 %
WBC mixed population: 589 {cells}/uL (ref 200–950)
WBC: 6.4 10*3/uL (ref 3.8–10.8)

## 2017-07-15 LAB — HEPATIC FUNCTION PANEL
AG Ratio: 1.7 (calc) (ref 1.0–2.5)
ALT: 16 U/L (ref 9–46)
AST: 15 U/L (ref 10–35)
Albumin: 4 g/dL (ref 3.6–5.1)
Alkaline phosphatase (APISO): 85 U/L (ref 40–115)
Bilirubin, Direct: 0.2 mg/dL (ref 0.0–0.2)
Globulin: 2.4 g/dL (ref 1.9–3.7)
Indirect Bilirubin: 0.8 mg/dL (ref 0.2–1.2)
Total Bilirubin: 1 mg/dL (ref 0.2–1.2)
Total Protein: 6.4 g/dL (ref 6.1–8.1)

## 2017-07-15 LAB — LIPID PANEL
Cholesterol: 148 mg/dL
HDL: 39 mg/dL — ABNORMAL LOW
LDL Cholesterol (Calc): 87 mg/dL
Non-HDL Cholesterol (Calc): 109 mg/dL
Total CHOL/HDL Ratio: 3.8 (calc)
Triglycerides: 123 mg/dL

## 2017-07-15 LAB — HEMOGLOBIN A1C
Hgb A1c MFr Bld: 8 %{Hb} — ABNORMAL HIGH
Mean Plasma Glucose: 183 (calc)
eAG (mmol/L): 10.1 (calc)

## 2017-07-15 LAB — MAGNESIUM: Magnesium: 2.2 mg/dL (ref 1.5–2.5)

## 2017-07-15 LAB — TSH: TSH: 0.98 m[IU]/L (ref 0.40–4.50)

## 2017-07-17 NOTE — Progress Notes (Signed)
Pt aware of lab results & voiced understanding of those results.

## 2017-07-21 ENCOUNTER — Ambulatory Visit (INDEPENDENT_AMBULATORY_CARE_PROVIDER_SITE_OTHER): Payer: PPO | Admitting: Pharmacist Clinician (PhC)/ Clinical Pharmacy Specialist

## 2017-07-21 DIAGNOSIS — Z952 Presence of prosthetic heart valve: Secondary | ICD-10-CM | POA: Diagnosis not present

## 2017-07-21 DIAGNOSIS — Z7901 Long term (current) use of anticoagulants: Secondary | ICD-10-CM

## 2017-07-21 LAB — POCT INR: INR: 2.8

## 2017-08-18 ENCOUNTER — Ambulatory Visit (INDEPENDENT_AMBULATORY_CARE_PROVIDER_SITE_OTHER): Payer: PPO | Admitting: Pharmacist Clinician (PhC)/ Clinical Pharmacy Specialist

## 2017-08-18 DIAGNOSIS — Z7901 Long term (current) use of anticoagulants: Secondary | ICD-10-CM

## 2017-08-18 DIAGNOSIS — Z952 Presence of prosthetic heart valve: Secondary | ICD-10-CM

## 2017-08-18 LAB — POCT INR: INR: 3.3

## 2017-08-30 ENCOUNTER — Other Ambulatory Visit: Payer: Self-pay | Admitting: Internal Medicine

## 2017-09-22 ENCOUNTER — Ambulatory Visit (INDEPENDENT_AMBULATORY_CARE_PROVIDER_SITE_OTHER): Payer: PPO | Admitting: Pharmacist

## 2017-09-22 DIAGNOSIS — Z952 Presence of prosthetic heart valve: Secondary | ICD-10-CM | POA: Diagnosis not present

## 2017-09-22 DIAGNOSIS — Z7901 Long term (current) use of anticoagulants: Secondary | ICD-10-CM | POA: Diagnosis not present

## 2017-09-22 LAB — POCT INR: INR: 2.9

## 2017-10-11 ENCOUNTER — Other Ambulatory Visit: Payer: Self-pay | Admitting: Internal Medicine

## 2017-10-20 ENCOUNTER — Ambulatory Visit (INDEPENDENT_AMBULATORY_CARE_PROVIDER_SITE_OTHER): Payer: PPO | Admitting: Pharmacist

## 2017-10-20 DIAGNOSIS — Z952 Presence of prosthetic heart valve: Secondary | ICD-10-CM | POA: Diagnosis not present

## 2017-10-20 DIAGNOSIS — Z7901 Long term (current) use of anticoagulants: Secondary | ICD-10-CM

## 2017-10-20 LAB — POCT INR: INR: 2.2

## 2017-10-22 NOTE — Patient Instructions (Signed)

## 2017-10-22 NOTE — Progress Notes (Signed)
[facility]   Unk Pinto, M.D.     Uvaldo Bristle. Silverio Lay, P.A.-C Liane Comber, [place]                [address] [place], [place] [address] Telephone [phone] Telefax [phone] Annual  Screening/Preventative Visit  & Comprehensive Evaluation & Examination     This very nice 76 y.o. MWM presents for a Screening/Preventative Visit & comprehensive evaluation and management of multiple medical co-morbidities.  Patient has been followed for HTN, ASCAD/CABG/AoVR, T2_NIDDM, Hyperlipidemia and Vitamin D Deficiency.     HTN predates since 69. Patient's BP has been controlled at home.  He underwent CABG & Mech AoVR in 2004. Nuclear Stress test was negative in 2016 by Dr Debara Pickett. Today's BP is at goal - 128/74. Patient denies any cardiac symptoms as chest pain, palpitations, shortness of breath, dizziness or ankle swelling.     Patient's hyperlipidemia is controlled with diet and medications. Patient denies myalgias or other medication SE's. Last lipids were  Lab Results  Component Value Date   CHOL 145 10/23/2017   HDL 42 10/23/2017   LDLCALC 74 04/07/2017   TRIG 87 10/23/2017   CHOLHDL 3.5 10/23/2017      Patient has Morbid Obesity (BMI 32+) and T2_NIDDM since 2004 w/ hx/o CKD2 and patient denies reactive hypoglycemic symptoms, visual blurring, diabetic polys or paresthesias.  He alleges CBG's range betw 160-180 mg%. Last A1c was not at goal: Lab Results  Component Value Date   HGBA1C 7.1 (H) 10/23/2017       Finally, patient has history of Vitamin D Deficiency ("28" /2012)   and last vitamin D was still not at goal: Lab Results  Component Value Date   VD25OH 21 (L) 10/23/2017   Current Outpatient Medications on File Prior to Visit  Medication Sig  . aspirin 81 MG tablet Take 81 mg by mouth daily.    . enalapril (VASOTEC) 20 MG tablet Take 2 tablets (40 mg total) by mouth daily.  Marland Kitchen  FARXIGA 10 MG TABS tablet TAKE 1 TABLET BY MOUTH EVERY DAY FOR DIABETES  . glipiZIDE (GLUCOTROL) 10 MG tablet TAKE 1 TABLET BY MOUTH 3 TIMES A DAY BEFORE MEALS  . glucose blood (ACCU-CHEK AVIVA PLUS) test strip Check blood sugar 1 time daily. DX-E11.9  . metFORMIN (GLUCOPHAGE-XR) 500 MG 24 hr tablet TAKE 2 TABLETS BY MOUTH TWICE A DAY WITH A MEAL  . metoprolol succinate (TOPROL-XL) 50 MG 24 hr tablet Take 1 tablet (50 mg total) by mouth daily. Take with or immediately following a meal.  . ranitidine (ZANTAC) 300 MG tablet TAKE 1 TO 2 TABLETS BY MOUTH EVERY DAY FOR HEARTBURN TO WEAN OFF PRILOSEC/OMEPRAZOLE  . simvastatin (ZOCOR) 40 MG tablet TAKE 1 TABLET BY MOUTH EVERY DAY  . terbinafine (LAMISIL) 250 MG tablet Do 1pill daily for 1 month on, 1 month off until run out  . warfarin (COUMADIN) 5 MG tablet Take 1/2 to 1 tablet by mouth daily or as directed   No current facility-administered medications on file prior to visit.    No Known Allergies Past Medical History:  Diagnosis Date  . Anemia   . AS (aortic stenosis)    valve replacement-st. jude mechanical valve  . ASHD (arteriosclerotic heart disease)   . CAD (coronary artery disease), native coronary artery    CABG 07/18/2003  . Cancer (  [place])    prostate  . GERD (gastroesophageal reflux disease)   . History of nuclear stress test 02/12/10   EF 67%, normal perfusion  . Hyperlipidemia   . Hypertension   . Morbid obesity ([place])   . Type II or unspecified type diabetes mellitus without mention of complication, not stated as uncontrolled   . Umbilical hernia   . Vitamin B12 deficiency   . Vitamin D deficiency    Health Maintenance  Topic Date Due  . INFLUENZA VACCINE  06/24/2017  . OPHTHALMOLOGY EXAM  10/22/2017  . TETANUS/TDAP  11/24/2017  . HEMOGLOBIN A1C  04/22/2018  . FOOT EXAM  10/23/2018  . COLONOSCOPY  10/08/2025  . PNA vac Low Risk Adult  Completed   Immunization History  Administered Date(s) Administered  . Influenza  Whole 09/01/2012  . Influenza, High Dose Seasonal PF 09/27/2015  . Influenza-Unspecified 08/25/2016  . Pneumococcal Conjugate-13 12/18/2015  . Pneumococcal Polysaccharide-23 09/01/2012  . Tdap 11/25/2007   Past Surgical History:  Procedure Laterality Date  . AORTIC VALVE REPLACEMENT  07/18/2003   57mm ATS-AP valve  . AORTIC VALVE REPLACEMENT (AVR)/CORONARY ARTERY BYPASS GRAFTING (CABG)  07/18/2003   AVR; bypass x 2-SVG to first diag, SVG to posterior descending  . CARDIAC CATHETERIZATION  07/13/2003   AS-aortic valve gradient 66mmhg, valve area 1.1; obstruction of ostium of posterior lateral branch with obstruction in the ostium of first diagnonal  . HERNIA REPAIR  54/07/8118   RIH, umbilical  . NM MYOCAR PERF WALL MOTION  02/12/2010   protocol:Bruce, EF 67%, exercise cap 7 METS, low risk scan  . TEE WITHOUT CARDIOVERSION  07/18/2003   no evidence of PE, Aortic valve heavily calcified,   . TRANSTHORACIC ECHOCARDIOGRAM  02/21/2013   EF60-65%, Septal motion showed paradox. atrium moderately dilated   Family History  Problem Relation Age of Onset  . Stroke Mother   . Heart attack Father   . Heart disease Father   . Cancer Brother   . Cancer Sister    Social History   Socioeconomic History  . Marital status: Married    ROS Constitutional: Denies fever, chills, weight loss/gain, headaches, insomnia,  night sweats or change in appetite. Does c/o fatigue. Eyes: Denies redness, blurred vision, diplopia, discharge, itchy or watery eyes.  ENT: Denies discharge, congestion, post nasal drip, epistaxis, sore throat, earache, hearing loss, dental pain, Tinnitus, Vertigo, Sinus pain or snoring.  Cardio: Denies chest pain, palpitations, irregular heartbeat, syncope, dyspnea, diaphoresis, orthopnea, PND, claudication or edema Respiratory: denies cough, dyspnea, DOE, pleurisy, hoarseness, laryngitis or wheezing.  Gastrointestinal: Denies dysphagia, heartburn, reflux, water brash, pain,  cramps, nausea, vomiting, bloating, diarrhea, constipation, hematemesis, melena, hematochezia, jaundice or hemorrhoids Genitourinary: Denies dysuria, frequency, urgency, nocturia, hesitancy, discharge, hematuria or flank pain Musculoskeletal: Denies arthralgia, myalgia, stiffness, Jt. Swelling, pain, limp or strain/sprain. Denies Falls. Skin: Denies puritis, rash, hives, warts, acne, eczema or change in skin lesion Neuro: No weakness, tremor, incoordination, spasms, paresthesia or pain Psychiatric: Denies confusion, memory loss or sensory loss. Denies Depression. Endocrine: Denies change in weight, skin, hair change, nocturia, and paresthesia, diabetic polys, visual blurring or hyper / hypo glycemic episodes.  Heme/Lymph: No excessive bleeding, bruising or enlarged lymph nodes.  Physical Exam  BP 128/74   Pulse 64   Temp (!) 97.2 F (36.2 C)   Resp 18   Ht 5\' 9"  (1.753 m)   Wt 219 lb 3.2 oz (99.4 kg)   BMI 32.37 kg/m   General Appearance: Well nourished and well groomed  and in no apparent distress.  Eyes: PERRLA, EOMs, conjunctiva no swelling or erythema, normal fundi and vessels. Sinuses: No frontal/maxillary tenderness ENT/Mouth: EACs patent / TMs  nl. Nares clear without erythema, swelling, mucoid exudates. Oral hygiene is good. No erythema, swelling, or exudate. Tongue normal, non-obstructing. Tonsils not swollen or erythematous. Hearing normal.  Neck: Supple, thyroid normal. No bruits, nodes or JVD. Respiratory: Respiratory effort normal.  BS equal and clear bilateral without rales, rhonci, wheezing or stridor. Cardio: Heart sounds are normal with regular rate and rhythm and no murmurs, rubs or gallops. Peripheral pulses are normal and equal bilaterally without edema. No aortic or femoral bruits. Chest: symmetric with normal excursions and percussion.  Abdomen: Soft, with Nl bowel sounds. Nontender, no guarding, rebound, hernias, masses, or organomegaly.  Lymphatics: Non tender  without lymphadenopathy.  Genitourinary: No hernias.Testes nl. DRE - prostate nl for age - smooth & firm w/o nodules. Musculoskeletal: Full ROM all peripheral extremities, joint stability, 5/5 strength, and normal gait. Skin: Warm and dry without rashes, lesions, cyanosis, clubbing or  ecchymosis.  Neuro: Cranial nerves intact, reflexes equal bilaterally. Normal muscle tone, no cerebellar symptoms. Sensation intact.  Pysch: Alert and oriented X 3 with normal affect, insight and judgment appropriate.   Assessment and Plan  1. Annual Preventative/Screening Exam   2. Essential hypertension  - DG Chest 2 View - [place], RETROPERITNL ABD,  LTD - Urinalysis, Routine w reflex microscopic - Microalbumin / creatinine urine ratio - CBC with Differential/Platelet - BASIC METABOLIC PANEL WITH GFR - Magnesium - TSH  3. Hyperlipidemia, mixed  - [place], RETROPERITNL ABD,  LTD - Hepatic function panel - Lipid panel - TSH  4. Type 2 diabetes mellitus with stage 2 chronic kidney disease, without long-term current use of insulin (HCC)  - [place], RETROPERITNL ABD,  LTD - HM DIABETES FOOT EXAM - LOW EXTREMITY NEUR EXAM DOCUM - Hemoglobin A1c - Insulin, random  5. Vitamin D deficiency  - VITAMIN D 25 Hydroxy  6. Coronary artery disease due to lipid rich plaque  - Lipid panel  7. Hx of CABG  - Lipid panel  8. S/P AVR  - Lipid panel  9. Screening for ischemic heart disease   10. Screening for AAA (aortic abdominal aneurysm)  - [place], RETROPERITNL ABD,  LTD  11. Screening for colorectal cancer  - POC Hemoccult Bld/Stl   12. Prostate cancer screening  - PSA  13. Medication management  - Urinalysis, Routine w reflex microscopic - Microalbumin / creatinine urine ratio - CBC with Differential/Platelet - BASIC METABOLIC PANEL WITH GFR - Hepatic function panel - Magnesium - Lipid panel - TSH - Hemoglobin A1c - Insulin, random - VITAMIN D 25 Hydroxy  14. Benign localized prostatic  hyperplasia with lower urinary tract symptoms (LUTS)  - PSA      Patient was counseled in prudent diet, weight control to achieve/maintain BMI less than 25, BP monitoring, regular exercise and medications as discussed.  Discussed med effects and SE's. Routine screening labs and tests as requested with regular follow-up as recommended. Over 40 minutes of exam, counseling, chart review and high complex critical decision making was performed

## 2017-10-23 ENCOUNTER — Other Ambulatory Visit: Payer: Self-pay | Admitting: *Deleted

## 2017-10-23 ENCOUNTER — Ambulatory Visit: Payer: PPO | Admitting: Internal Medicine

## 2017-10-23 VITALS — BP 128/74 | HR 64 | Temp 97.2°F | Resp 18 | Ht 69.0 in | Wt 219.2 lb

## 2017-10-23 DIAGNOSIS — E1122 Type 2 diabetes mellitus with diabetic chronic kidney disease: Secondary | ICD-10-CM | POA: Diagnosis not present

## 2017-10-23 DIAGNOSIS — Z0000 Encounter for general adult medical examination without abnormal findings: Secondary | ICD-10-CM

## 2017-10-23 DIAGNOSIS — Z1212 Encounter for screening for malignant neoplasm of rectum: Secondary | ICD-10-CM

## 2017-10-23 DIAGNOSIS — Z125 Encounter for screening for malignant neoplasm of prostate: Secondary | ICD-10-CM

## 2017-10-23 DIAGNOSIS — I10 Essential (primary) hypertension: Secondary | ICD-10-CM | POA: Diagnosis not present

## 2017-10-23 DIAGNOSIS — E559 Vitamin D deficiency, unspecified: Secondary | ICD-10-CM

## 2017-10-23 DIAGNOSIS — Z136 Encounter for screening for cardiovascular disorders: Secondary | ICD-10-CM

## 2017-10-23 DIAGNOSIS — Z1211 Encounter for screening for malignant neoplasm of colon: Secondary | ICD-10-CM

## 2017-10-23 DIAGNOSIS — Z0001 Encounter for general adult medical examination with abnormal findings: Secondary | ICD-10-CM

## 2017-10-23 DIAGNOSIS — Z79899 Other long term (current) drug therapy: Secondary | ICD-10-CM

## 2017-10-23 DIAGNOSIS — I2583 Coronary atherosclerosis due to lipid rich plaque: Secondary | ICD-10-CM

## 2017-10-23 DIAGNOSIS — E782 Mixed hyperlipidemia: Secondary | ICD-10-CM

## 2017-10-23 DIAGNOSIS — I2510 Atherosclerotic heart disease of native coronary artery without angina pectoris: Secondary | ICD-10-CM

## 2017-10-23 DIAGNOSIS — N401 Enlarged prostate with lower urinary tract symptoms: Secondary | ICD-10-CM

## 2017-10-23 DIAGNOSIS — N182 Chronic kidney disease, stage 2 (mild): Secondary | ICD-10-CM

## 2017-10-23 DIAGNOSIS — Z951 Presence of aortocoronary bypass graft: Secondary | ICD-10-CM

## 2017-10-23 DIAGNOSIS — Z952 Presence of prosthetic heart valve: Secondary | ICD-10-CM

## 2017-10-23 MED ORDER — CYANOCOBALAMIN 1000 MCG/ML IJ SOLN: INTRAMUSCULAR | 2 refills | Status: DC

## 2017-10-23 MED ORDER — CLOTRIMAZOLE-BETAMETHASONE 1-0.05 % EX CREA: TOPICAL_CREAM | CUTANEOUS | 3 refills | Status: DC

## 2017-10-25 ENCOUNTER — Encounter: Payer: Self-pay | Admitting: Internal Medicine

## 2017-10-26 LAB — BASIC METABOLIC PANEL WITHOUT GFR
BUN: 13 mg/dL (ref 7–25)
CO2: 23 mmol/L (ref 20–32)
Calcium: 9.1 mg/dL (ref 8.6–10.3)
Chloride: 104 mmol/L (ref 98–110)
Creat: 0.74 mg/dL (ref 0.70–1.18)
GFR, Est African American: 105 mL/min/{1.73_m2}
GFR, Est Non African American: 90 mL/min/{1.73_m2}
Glucose, Bld: 194 mg/dL — ABNORMAL HIGH (ref 65–99)
Potassium: 4.5 mmol/L (ref 3.5–5.3)
Sodium: 138 mmol/L (ref 135–146)

## 2017-10-26 LAB — CBC WITH DIFFERENTIAL/PLATELET
Basophils Absolute: 22 {cells}/uL (ref 0–200)
Basophils Relative: 0.4 %
Eosinophils Absolute: 99 {cells}/uL (ref 15–500)
Eosinophils Relative: 1.8 %
HCT: 43.9 % (ref 38.5–50.0)
Hemoglobin: 15.4 g/dL (ref 13.2–17.1)
Lymphs Abs: 990 {cells}/uL (ref 850–3900)
MCH: 31.2 pg (ref 27.0–33.0)
MCHC: 35.1 g/dL (ref 32.0–36.0)
MCV: 89 fL (ref 80.0–100.0)
MPV: 11.6 fL (ref 7.5–12.5)
Monocytes Relative: 8.7 %
Neutro Abs: 3911 {cells}/uL (ref 1500–7800)
Neutrophils Relative %: 71.1 %
Platelets: 171 10*3/uL (ref 140–400)
RBC: 4.93 Million/uL (ref 4.20–5.80)
RDW: 13.5 % (ref 11.0–15.0)
Total Lymphocyte: 18 %
WBC mixed population: 479 {cells}/uL (ref 200–950)
WBC: 5.5 10*3/uL (ref 3.8–10.8)

## 2017-10-26 LAB — HEPATIC FUNCTION PANEL
AG Ratio: 1.8 (calc) (ref 1.0–2.5)
ALT: 18 U/L (ref 9–46)
AST: 17 U/L (ref 10–35)
Albumin: 4.2 g/dL (ref 3.6–5.1)
Alkaline phosphatase (APISO): 80 U/L (ref 40–115)
Bilirubin, Direct: 0.2 mg/dL (ref 0.0–0.2)
Globulin: 2.4 g/dL (ref 1.9–3.7)
Indirect Bilirubin: 0.6 mg/dL (ref 0.2–1.2)
Total Bilirubin: 0.8 mg/dL (ref 0.2–1.2)
Total Protein: 6.6 g/dL (ref 6.1–8.1)

## 2017-10-26 LAB — MICROALBUMIN / CREATININE URINE RATIO
Creatinine, Urine: 54 mg/dL (ref 20–320)
Microalb Creat Ratio: 9 ug/mg{creat}
Microalb, Ur: 0.5 mg/dL

## 2017-10-26 LAB — LIPID PANEL
Cholesterol: 145 mg/dL
HDL: 42 mg/dL
LDL Cholesterol (Calc): 85 mg/dL
Non-HDL Cholesterol (Calc): 103 mg/dL
Total CHOL/HDL Ratio: 3.5 (calc)
Triglycerides: 87 mg/dL

## 2017-10-26 LAB — URINALYSIS, ROUTINE W REFLEX MICROSCOPIC
Bilirubin Urine: NEGATIVE
Hgb urine dipstick: NEGATIVE
Ketones, ur: NEGATIVE
Leukocytes, UA: NEGATIVE
Nitrite: NEGATIVE
Protein, ur: NEGATIVE
Specific Gravity, Urine: 1.032 (ref 1.001–1.03)
pH: <=5 (ref 5.0–8.0)

## 2017-10-26 LAB — HEMOGLOBIN A1C
Hgb A1c MFr Bld: 7.1 %{Hb} — ABNORMAL HIGH
Mean Plasma Glucose: 157 (calc)
eAG (mmol/L): 8.7 (calc)

## 2017-10-26 LAB — PSA: PSA: <0.1 ng/mL

## 2017-10-26 LAB — TSH: TSH: 1.19 m[IU]/L (ref 0.40–4.50)

## 2017-10-26 LAB — INSULIN, RANDOM: Insulin: 16.4 u[IU]/mL (ref 2.0–19.6)

## 2017-10-26 LAB — MAGNESIUM: Magnesium: 2 mg/dL (ref 1.5–2.5)

## 2017-10-26 LAB — VITAMIN D 25 HYDROXY (VIT D DEFICIENCY, FRACTURES): Vit D, 25-Hydroxy: 21 ng/mL — ABNORMAL LOW (ref 30–100)

## 2017-11-11 DIAGNOSIS — E119 Type 2 diabetes mellitus without complications: Secondary | ICD-10-CM | POA: Diagnosis not present

## 2017-11-11 DIAGNOSIS — H2513 Age-related nuclear cataract, bilateral: Secondary | ICD-10-CM | POA: Diagnosis not present

## 2017-11-11 LAB — HM DIABETES EYE EXAM

## 2017-11-18 ENCOUNTER — Ambulatory Visit (INDEPENDENT_AMBULATORY_CARE_PROVIDER_SITE_OTHER): Payer: PPO | Admitting: Pharmacist Clinician (PhC)/ Clinical Pharmacy Specialist

## 2017-11-18 DIAGNOSIS — Z952 Presence of prosthetic heart valve: Secondary | ICD-10-CM | POA: Diagnosis not present

## 2017-11-18 DIAGNOSIS — Z7901 Long term (current) use of anticoagulants: Secondary | ICD-10-CM | POA: Diagnosis not present

## 2017-11-18 LAB — POCT INR: INR: 2.5

## 2017-12-03 ENCOUNTER — Encounter: Payer: Self-pay | Admitting: *Deleted

## 2017-12-15 ENCOUNTER — Other Ambulatory Visit: Payer: Self-pay | Admitting: Internal Medicine

## 2017-12-23 ENCOUNTER — Encounter: Payer: Self-pay | Admitting: Internal Medicine

## 2017-12-23 ENCOUNTER — Ambulatory Visit: Payer: PPO | Admitting: Internal Medicine

## 2017-12-23 ENCOUNTER — Ambulatory Visit (INDEPENDENT_AMBULATORY_CARE_PROVIDER_SITE_OTHER): Payer: PPO | Admitting: Pharmacist Clinician (PhC)/ Clinical Pharmacy Specialist

## 2017-12-23 VITALS — BP 121/73 | HR 60 | Ht 69.5 in | Wt 213.2 lb

## 2017-12-23 DIAGNOSIS — Z951 Presence of aortocoronary bypass graft: Secondary | ICD-10-CM

## 2017-12-23 DIAGNOSIS — I2510 Atherosclerotic heart disease of native coronary artery without angina pectoris: Secondary | ICD-10-CM | POA: Diagnosis not present

## 2017-12-23 DIAGNOSIS — Z7901 Long term (current) use of anticoagulants: Secondary | ICD-10-CM | POA: Diagnosis not present

## 2017-12-23 DIAGNOSIS — Z024 Encounter for examination for driving license: Secondary | ICD-10-CM

## 2017-12-23 DIAGNOSIS — I2583 Coronary atherosclerosis due to lipid rich plaque: Secondary | ICD-10-CM

## 2017-12-23 DIAGNOSIS — I10 Essential (primary) hypertension: Secondary | ICD-10-CM

## 2017-12-23 DIAGNOSIS — Z952 Presence of prosthetic heart valve: Secondary | ICD-10-CM | POA: Diagnosis not present

## 2017-12-23 LAB — POCT INR: INR: 2.2

## 2017-12-23 NOTE — Patient Instructions (Signed)
Description   Take 1.5 tablets today Wednesday Jan 30, then take 1 tablet daily except 1/2 tablet each Sunday, Tuesday and Thursday.  Repeat INR in 4 weeks

## 2017-12-23 NOTE — Patient Instructions (Signed)
Your physician wants you to follow-up in: ONE YEAR with Dr. Hilty. You will receive a reminder letter in the mail two months in advance. If you don't receive a letter, please call our office to schedule the follow-up appointment.  

## 2017-12-23 NOTE — Progress Notes (Signed)
OFFICE NOTE  Chief Complaint:  No complaints  Primary Care Physician: Unk Pinto, MD  HPI:  Billy Reilly is a 77 year old gentleman, previously followed by Dr. Rex Kras, with a history of coronary disease. In 2004, he had CABG and replacement of his aortic valve with a mechanical prosthesis. He had 2-vessel bypass at the time with saphenous vein graft to the first diagonal and saphenous vein graft to the posterior descending by Dr. Prescott Gum, and replacement of the aortic valve with a 22 mm ATS AP valve. He has done well with valve replacement to this point without any recurrent angina, shortness of breath, palpitations, presyncope, or syncopal symptoms. INR checked today in the office is 2.6 and it has been stable on Coumadin without dose adjustments for at least a year. 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 Recently he was started on Farxiga, which is resulted in excellent weight loss as well as control of his blood sugars.  Overall he feels quite well.  I saw Billy Reilly back today in the office. He is here for DOT visit. When I last saw him he was doing well denies any chest pain or shortness of breath. That holds true today. His warfarin levels have been well controlled his INR was 3.1 today. He has not had stress testing in over 5 years and his bypass was in 2004. 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  Billy Reilly returns today for follow-up. I last saw him 6 months ago for DOT physical. At that time we performed a nuclear stress test which was negative for ischemia. 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 He denies any chest pain or worsening shortness of breath. Blood pressures been well controlled.  I saw Billy Reilly back today in the office for follow-up. He returns for a DOT letter. He continues to do well after bypass surgery  mechanical valve replacement. His INRs have been therapeutic. He is completely asymptomatic. 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 He had a stress test last year which was negative for ischemia. He is due for repeat echocardiogram to reassess his valve gradients this year.  01/20/2017  Billy Reilly was seen today in follow-up. He is due for renewal of his DOT physical. From a cardiac standpoint he seems stable. Denies a chest pain or worsening shortness of breath. An echocardiogram last year which showed a stable aortic valve gradient. 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 He is on warfarin and INR today was 2.8. EKG shows sinus rhythm.  12/23/2017  Billy Reilly was seen today for routine follow-up.  He continues to work and requires a renewal of his DOT letter.  He has an upcoming echo at the end of February to assess the stability of his valve.  He denies any chest pain worsening shortness of breath.  His INR was a slightly low he needs some adjustment.  EKG is unchanged.  PMHx:  Past Medical History:  Diagnosis Date  . Anemia   . AS (aortic stenosis)    valve replacement-st. jude mechanical valve  . ASHD (arteriosclerotic heart disease)   . CAD (coronary artery disease), native coronary artery    CABG 07/18/2003  . Cancer [facility])    prostate  . GERD (gastroesophageal reflux  disease)   . History of nuclear stress test 02/12/10   EF 67%, normal perfusion  . Hyperlipidemia   . Hypertension   . Morbid obesity ([place])   . Type II or unspecified type diabetes mellitus without mention of complication, not stated as uncontrolled   . Umbilical hernia   . Vitamin B12 deficiency   . Vitamin D deficiency     Past Surgical History:  Procedure Laterality Date  . AORTIC VALVE REPLACEMENT  07/18/2003   99mm ATS-AP valve  . AORTIC VALVE REPLACEMENT (AVR)/CORONARY ARTERY BYPASS GRAFTING (CABG)  07/18/2003   AVR; bypass x  2-SVG to first diag, SVG to posterior descending  . CARDIAC CATHETERIZATION  07/13/2003   AS-aortic valve gradient 54mmhg, valve area 1.1; obstruction of ostium of posterior lateral branch with obstruction in the ostium of first diagnonal  . HERNIA REPAIR  19/37/9024   RIH, umbilical  . NM MYOCAR PERF WALL MOTION  02/12/2010   protocol:Bruce, EF 67%, exercise cap 7 METS, low risk scan  . TEE WITHOUT CARDIOVERSION  07/18/2003   no evidence of PE, Aortic valve heavily calcified,   . TRANSTHORACIC ECHOCARDIOGRAM  02/21/2013   EF60-65%, Septal motion showed paradox. atrium moderately dilated    FAMHx:  Family History  Problem Relation Age of Onset  . Stroke Mother   . Heart attack Father   . Heart disease Father   . Cancer Brother   . Cancer Sister     SOCHx:   reports that  has never smoked. he has never used smokeless tobacco. He reports that he does not drink alcohol or use drugs.  ALLERGIES:  No Known Allergies  ROS: Pertinent items noted in HPI and remainder of comprehensive ROS otherwise negative.  HOME MEDS: Current Outpatient Medications  Medication Sig Dispense Refill  . aspirin 81 MG tablet Take 81 mg by mouth daily.      . clotrimazole-betamethasone (LOTRISONE) cream APPLY TO AFFECTED AREA TWICE A DAY FOR 2 WEEKS 45 g 3  . cyanocobalamin (,VITAMIN B-12,) 1000 MCG/ML injection INJECT 1/2 ML EVERY MONTH 10 mL 2  . enalapril (VASOTEC) 20 MG tablet Take 2 tablets (40 mg total) by mouth daily. 180 tablet 3  . FARXIGA 10 MG TABS tablet TAKE 1 TABLET BY MOUTH EVERY DAY FOR DIABETES 30 tablet 2  . glipiZIDE (GLUCOTROL) 10 MG tablet TAKE 1 TABLET BY MOUTH 3 TIMES A DAY BEFORE MEALS 270 tablet 1  . glucose blood (ACCU-CHEK AVIVA PLUS) test strip Check blood sugar 1 time daily. DX-E11.9 100 each 12  . metFORMIN (GLUCOPHAGE-XR) 500 MG 24 hr tablet TAKE 2 TABLETS BY MOUTH TWICE A DAY WITH A MEAL 360 tablet 1  . metoprolol succinate (TOPROL-XL) 50 MG 24 hr tablet Take 1 tablet (50  mg total) by mouth daily. Take with or immediately following a meal. 90 tablet 3  . ranitidine (ZANTAC) 300 MG tablet TAKE 1 TO 2 TABLETS BY MOUTH EVERY DAY FOR HEARTBURN TO WEAN OFF PRILOSEC/OMEPRAZOLE 180 tablet 1  . simvastatin (ZOCOR) 40 MG tablet TAKE 1 TABLET BY MOUTH EVERY DAY 90 tablet 3  . terbinafine (LAMISIL) 250 MG tablet Do 1pill daily for 1 month on, 1 month off until run out 90 tablet 0  . warfarin (COUMADIN) 5 MG tablet Take 1/2 to 1 tablet by mouth daily or as directed 90 tablet 1   No current facility-administered medications for this visit.     LABS/IMAGING: Results for orders placed or performed in visit on 12/23/17 (from  the past 48 hour(s))  POCT INR     Status: None   Collection Time: 12/23/17  8:47 AM  Result Value Ref Range   INR 2.2    No results found.  VITALS: BP 121/73   Pulse 60   Ht 5' 9.5" (1.765 m)   Wt 213 lb 3.2 oz (96.7 kg)   BMI 31.03 kg/m   EXAM: General appearance: alert and no distress Neck: no carotid bruit, no JVD and thyroid not enlarged, symmetric, no tenderness/mass/nodules Lungs: clear to auscultation bilaterally Heart: regular rate and rhythm, S1, S2 normal, no murmur, click, rub or gallop Abdomen: soft, non-tender; bowel sounds normal; no masses,  no organomegaly Extremities: extremities normal, atraumatic, no cyanosis or edema Pulses: 2+ and symmetric Skin: Skin color, texture, turgor normal. No rashes or lesions Neurologic: Grossly normal Psych: Mood, affect normal  EKG: Sinus rhythm with PACs at 60-personally reviewed  ASSESSMENT: 1. CAD status post 2 vessel CABG (SVG to diagonal SVG to PDA) in 2004 2. Status post ATS mechanical aortic valve 3. Lifetime anticoagulation on warfarin 4. HTN 5. Dyslipidemia 6. DM2 7. Encounter for DOT physical  PLAN: 1.   Billy Reilly denies any chest pain or shortness of breath. INR has been slightly low recently. He has crisp mechanical valve sounds. He will have a repeat echo at the end  of February at which time we can likely clear him for continued DOT driving.  Plan to see him back annually or sooner as necessary.  [name], MD, [facility], [place] Director of the [facility]  [facility] Diplomate of the American Board of Clinical Lipidology Attending Cardiologist  Direct Dial: [phone]  Fax: [phone]  Website:  [email or web address] 12/23/2017, 9:25 AM

## 2017-12-31 ENCOUNTER — Other Ambulatory Visit: Payer: Self-pay | Admitting: Internal Medicine

## 2018-01-01 ENCOUNTER — Other Ambulatory Visit: Payer: Self-pay | Admitting: *Deleted

## 2018-01-01 MED ORDER — SIMVASTATIN 40 MG PO TABS: 40.0000 mg | ORAL_TABLET | Freq: Every day | ORAL | 3 refills | Status: DC

## 2018-01-04 ENCOUNTER — Other Ambulatory Visit: Payer: Self-pay | Admitting: Internal Medicine

## 2018-01-04 ENCOUNTER — Other Ambulatory Visit: Payer: Self-pay | Admitting: *Deleted

## 2018-01-04 MED ORDER — METOPROLOL SUCCINATE ER 50 MG PO TB24: 50.0000 mg | ORAL_TABLET | Freq: Every day | ORAL | 3 refills | Status: DC

## 2018-01-04 NOTE — Telephone Encounter (Signed)
Rx has been sent to the pharmacy electronically. ° °

## 2018-01-14 ENCOUNTER — Other Ambulatory Visit: Payer: Self-pay

## 2018-01-14 ENCOUNTER — Ambulatory Visit (HOSPITAL_COMMUNITY): Payer: PPO | Attending: Cardiology

## 2018-01-14 DIAGNOSIS — I10 Essential (primary) hypertension: Secondary | ICD-10-CM | POA: Diagnosis not present

## 2018-01-14 DIAGNOSIS — E119 Type 2 diabetes mellitus without complications: Secondary | ICD-10-CM | POA: Insufficient documentation

## 2018-01-14 DIAGNOSIS — I359 Nonrheumatic aortic valve disorder, unspecified: Secondary | ICD-10-CM | POA: Insufficient documentation

## 2018-01-14 DIAGNOSIS — E785 Hyperlipidemia, unspecified: Secondary | ICD-10-CM | POA: Diagnosis not present

## 2018-01-14 DIAGNOSIS — Z952 Presence of prosthetic heart valve: Secondary | ICD-10-CM | POA: Diagnosis not present

## 2018-01-14 DIAGNOSIS — I272 Pulmonary hypertension, unspecified: Secondary | ICD-10-CM | POA: Diagnosis not present

## 2018-01-15 ENCOUNTER — Telehealth: Payer: Self-pay | Admitting: Internal Medicine

## 2018-01-15 NOTE — Telephone Encounter (Signed)
New message  Pt verbalized that he is calling for the RN  To see if Dr.Hilty wrote his letter  He did not disclose the means of the letter

## 2018-01-15 NOTE — Telephone Encounter (Signed)
Patient called w/echo results. Will pick up DOT letter today

## 2018-01-15 NOTE — Telephone Encounter (Signed)
Returned call to patient. Made him aware that MD will review echo but is out of the office this week. He needs a letter for DOT clearance if echo is fine.

## 2018-01-19 ENCOUNTER — Ambulatory Visit (INDEPENDENT_AMBULATORY_CARE_PROVIDER_SITE_OTHER): Payer: PPO | Admitting: Pharmacist Clinician (PhC)/ Clinical Pharmacy Specialist

## 2018-01-19 DIAGNOSIS — Z7901 Long term (current) use of anticoagulants: Secondary | ICD-10-CM | POA: Diagnosis not present

## 2018-01-19 DIAGNOSIS — Z952 Presence of prosthetic heart valve: Secondary | ICD-10-CM | POA: Diagnosis not present

## 2018-01-19 LAB — POCT INR: INR: 3

## 2018-01-20 ENCOUNTER — Other Ambulatory Visit: Payer: Self-pay | Admitting: Physician Assistant

## 2018-02-03 NOTE — Progress Notes (Signed)
MEDICARE ANNUAL WELLNESS VISIT AND FOLLOW UP Assessment:    Diagnoses and all orders for this visit:  Encounter for Medicare annual wellness exam  Coronary artery disease due to lipid rich plaque Control blood pressure, cholesterol, glucose, increase exercise.  Followed by cardiology -     Lipid panel  Essential hypertension Continue medication Monitor blood pressure at home; call if consistently over 130/80 Continue DASH diet.   Reminder to go to the ER if any CP, SOB, nausea, dizziness, severe HA, changes vision/speech, left arm numbness and tingling and jaw pain.  Type II diabetes mellitus with nephropathy [facility]) Education: Reviewed 'ABCs' of diabetes management (respective goals in parentheses):  A1C (<7), blood pressure (<130/80), and cholesterol (LDL <70) Eye Exam yearly and Dental Exam every 6 months - up to date Dietary recommendations Physical Activity recommendations Just had foot exam - defer today -     Hemoglobin A1c  H/O mechanical aortic valve replacement Continue coumadin; followed by cardiology  Hx of CABG Control blood pressure, cholesterol, glucose, increase exercise.  Followed by cardiolgy  Long term current use of anticoagulant therapy Managed by cardiology  Medication management -     CBC with Differential/Platelet -     BASIC METABOLIC PANEL WITH GFR -     Hepatic function panel  Mixed hyperlipidemia Continue medications Continue low cholesterol diet and exercise.  Check lipid panel.  -     Lipid panel -     TSH  Obesity (BMI 30.0-34.9) Long discussion about weight loss, diet, and exercise Recommended diet heavy in fruits and veggies and low in animal meats, cheeses, and dairy products, appropriate calorie intake Discussed appropriate weight for height and initial goal (200 lb) Follow up at next visit  Umbilical hernia without obstruction and without gangrene No complications; continue to monitor  Vitamin B12 deficiency Very low at last  check over a year ago; recommend supplementation as indicated to maintain appropriate levels -     Vitamin B12  Vitamin D deficiency Patient has not been supplementing; discussed risks/benefits - goal is 70-100, continue to recommend supplementation -     VITAMIN D 25 Hydroxy (Vit-D Deficiency, Fractures)   Over 30 minutes of exam, counseling, chart review, and critical decision making was performed  Future Appointments  Date Time Provider [place]  02/16/2018 10:45 AM [facility]  05/11/2018 10:30 AM Unk Pinto, MD [facility] None  11/10/2018 11:00 AM Unk Pinto, MD [facility] None     Plan:   During the course of the visit the patient was educated and counseled about appropriate screening and preventive services including:    Pneumococcal vaccine   Influenza vaccine  Prevnar 13  Td vaccine  Screening electrocardiogram  Colorectal cancer screening  Diabetes screening  Glaucoma screening  Nutrition counseling    Subjective:  Billy Reilly is a 77 y.o. male with hx of ASCAD/CABG/AoVR who presents for Medicare Annual Wellness Visit and 3 month follow up for HTN, hyperlipidemia, T2DM, and vitamin D Def. He has hx of CABG & Mech AoVR in 2004. Nuclear Stress test was negative in 2016 by Dr Debara Pickett.   BMI is Body mass index is 31.47 kg/m., he has not been working on diet and exercise. Wt Readings from Last 3 Encounters:  02/04/18 216 lb 3.2 oz (98.1 kg)  12/23/17 213 lb 3.2 oz (96.7 kg)  10/23/17 219 lb 3.2 oz (99.4 kg)   His blood pressure has been controlled at home, today their BP is BP:  124/80 He does not workout. He denies chest pain, shortness of breath, dizziness.   He is on cholesterol medication (simvastatin 40 mg daily) and denies myalgias. His cholesterol is not at goal for LDL <70. The cholesterol last visit was:   Lab Results  Component Value Date   CHOL 145 10/23/2017   HDL 42 10/23/2017   LDLCALC  85 10/23/2017   TRIG 87 10/23/2017   CHOLHDL 3.5 10/23/2017   He has not been working on diet and exercise for T2DM, and denies foot ulcerations, hypoglycemia , increased appetite, nausea, paresthesia of the feet, polydipsia, polyuria, visual disturbances, vomiting and weight loss. He checks a fasting glucose once a week or so, estimates running 165. Last A1C in the office was:  Lab Results  Component Value Date   HGBA1C 7.1 (H) 10/23/2017   Last GFR Lab Results  Component Value Date   GFRNONAA 90 10/23/2017    Patient is not on Vitamin D supplement and very low at recent check:    Lab Results  Component Value Date   VD25OH 21 (L) 10/23/2017      Medication Review: Current Outpatient Medications on File Prior to Visit  Medication Sig Dispense Refill  . aspirin 81 MG tablet Take 81 mg by mouth daily.      . clotrimazole-betamethasone (LOTRISONE) cream APPLY TO AFFECTED AREA TWICE A DAY FOR 2 WEEKS 45 g 3  . cyanocobalamin (,VITAMIN B-12,) 1000 MCG/ML injection INJECT 1/2 ML EVERY MONTH 10 mL 2  . enalapril (VASOTEC) 20 MG tablet Take 2 tablets (40 mg total) by mouth daily. 180 tablet 3  . FARXIGA 10 MG TABS tablet TAKE 1 TABLET BY MOUTH EVERY DAY FOR DIABETES 30 tablet 2  . glipiZIDE (GLUCOTROL) 10 MG tablet TAKE 1 TABLET BY MOUTH 3 TIMES A DAY BEFORE MEALS 270 tablet 1  . glucose blood (ONE TOUCH ULTRA TEST) test strip CHECK BLOOD SUGAR ONCE DAILY 100 each 1  . metFORMIN (GLUCOPHAGE-XR) 500 MG 24 hr tablet TAKE 2 TABLETS BY MOUTH TWICE A DAY WITH A MEAL 360 tablet 1  . metoprolol succinate (TOPROL-XL) 50 MG 24 hr tablet Take 1 tablet (50 mg total) by mouth daily. Take with or immediately following a meal. 90 tablet 3  . ranitidine (ZANTAC) 300 MG tablet TAKE 1 TO 2 TABLETS BY MOUTH EVERY DAY FOR HEARTBURN TO WEAN OFF PRILOSEC/OMEPRAZOLE 180 tablet 1  . simvastatin (ZOCOR) 40 MG tablet Take 1 tablet (40 mg total) by mouth daily. 90 tablet 3  . terbinafine (LAMISIL) 250 MG tablet TAKE  1 TABLET BY MOUTH DAILY FOR 1 MONTH ON, 1 MONTH OFF UNTIL RUN OUT 90 tablet 0  . warfarin (COUMADIN) 5 MG tablet Take 1/2 to 1 tablet by mouth daily or as directed 90 tablet 1   No current facility-administered medications on file prior to visit.     Allergies: No Known Allergies  Current Problems (verified) has Umbilical hernia; Long term current use of anticoagulant therapy; H/O mechanical aortic valve replacement; Mixed hyperlipidemia; Type II diabetes mellitus with nephropathy ([place]); Vitamin D deficiency; Vitamin B12 deficiency; Essential hypertension; Medication management; Coronary artery disease due to lipid rich plaque; Hx of CABG; Encounter for Medicare annual wellness exam; and Obesity (BMI 30.0-34.9) on their problem list.  Screening Tests Immunization History  Administered Date(s) Administered  . Influenza Whole 09/01/2012  . Influenza, High Dose Seasonal PF 09/27/2015  . Influenza-Unspecified 08/25/2016  . Pneumococcal Conjugate-13 12/18/2015  . Pneumococcal Polysaccharide-23 09/01/2012  . Tdap 11/25/2007  Preventative care: Last colonoscopy: 2011  Prior vaccinations: TD or Tdap: 2009 DUE patient declines   Influenza: 2018  Pneumococcal: 2013 Prevnar13: 2017 Shingles/Zostavax: n/a in office today  Names of Other Physician/Practitioners you currently use: 1. [facility] here for primary care 2. Dr. Delman Cheadle, eye doctor, last visit 11/11/2017 - abstracted 3. Dr. Johnnye Sima , dentist, last visit 2018  Patient Care Team: Unk Pinto, MD as PCP - General (Internal Medicine) Debara Pickett Nadean Corwin, MD as Consulting Physician (Cardiology) Irine Seal, MD as Attending Physician (Urology) Laurence Spates, MD as Consulting Physician (Gastroenterology)  Surgical: He  has a past surgical history that includes Aortic valve replacement (07/18/2003); Hernia repair (09/11/2011); Aortic valve replacement (avr)/coronary artery bypass grafting  (cabg) (07/18/2003); Cardiac catheterization (07/13/2003); TEE without cardioversion (07/18/2003); transthoracic echocardiogram (02/21/2013); and NM MYOCAR PERF WALL MOTION (02/12/2010). Family His family history includes Cancer in his brother and sister; Heart attack in his father; Heart disease in his father; Stroke in his mother. Social history  He reports that  has never smoked. he has never used smokeless tobacco. He reports that he does not drink alcohol or use drugs.  MEDICARE WELLNESS OBJECTIVES: Physical activity: Current Exercise Habits: The patient has a physically strenous job, but has no regular exercise apart from work., Exercise limited by: None identified Cardiac risk factors: Cardiac Risk Factors include: diabetes mellitus;dyslipidemia;hypertension;male gender;obesity (BMI >30kg/m2) Depression/mood screen:   Depression screen [facility] 2/9 02/04/2018  Decreased Interest 0  Down, Depressed, Hopeless 0  PHQ - 2 Score 0    ADLs:  In your present state of health, do you have any difficulty performing the following activities: 02/04/2018 10/25/2017  Hearing? N N  Vision? N N  Difficulty concentrating or making decisions? N N  Walking or climbing stairs? N N  Dressing or bathing? N N  Doing errands, shopping? N N  Some recent data might be hidden     Cognitive Testing  Alert? Yes  Normal Appearance?Yes  Oriented to person? Yes  Place? Yes   Time? Yes  Recall of three objects?  Yes  Can perform simple calculations? Yes  Displays appropriate judgment?Yes  Can read the correct time from a watch face?Yes  EOL planning: Does Patient Have a Medical Advance Directive?: Yes Type of Advance Directive: Healthcare Power of Attorney, Living will Does patient want to make changes to medical advance directive?: No - Patient declined Copy of [place] in Chart?: No - copy requested   Objective:   Today's Vitals   02/04/18 1009  BP: 124/80  Pulse: 75  Resp: 16   Temp: (!) 97.3 F (36.3 C)  SpO2: 97%  Weight: 216 lb 3.2 oz (98.1 kg)  Height: 5' 9.5" (1.765 m)   Body mass index is 31.47 kg/m.  General appearance: alert, no distress, WD/WN, male HEENT: normocephalic, sclerae anicteric, TMs pearly, nares patent, no discharge or erythema, pharynx normal Oral cavity: MMM, no lesions Neck: supple, no lymphadenopathy, no thyromegaly, no masses Heart: RRR, normal S1, S2, no murmurs Lungs: CTA bilaterally, no wheezes, rhonchi, or rales Abdomen: +bs, soft, non tender, non distended, no masses, no hepatomegaly, no splenomegaly Musculoskeletal: nontender, no swelling, no obvious deformity Extremities: no edema, no cyanosis, no clubbing Pulses: 2+ symmetric, upper and lower extremities, normal cap refill Neurological: alert, oriented x 3, CN2-12 intact, strength normal upper extremities and lower extremities, sensation normal throughout, DTRs 2+ throughout, no cerebellar signs, gait normal Psychiatric: normal affect, behavior normal, pleasant   Medicare Attestation  I have personally reviewed: The patient's medical and social history Their use of alcohol, tobacco or illicit drugs Their current medications and supplements The patient's functional ability including ADLs,fall risks, home safety risks, cognitive, and hearing and visual impairment Diet and physical activities Evidence for depression or mood disorders  The patient's weight, height, BMI, and visual acuity have been recorded in the chart.  I have made referrals, counseling, and provided education to the patient based on review of the above and I have provided the patient with a written personalized care plan for preventive services.     Izora Ribas, NP   02/04/2018

## 2018-02-04 ENCOUNTER — Ambulatory Visit (INDEPENDENT_AMBULATORY_CARE_PROVIDER_SITE_OTHER): Payer: PPO | Admitting: Adult Health

## 2018-02-04 ENCOUNTER — Encounter: Payer: Self-pay | Admitting: Adult Health

## 2018-02-04 VITALS — BP 124/80 | HR 75 | Temp 97.3°F | Resp 16 | Ht 69.5 in | Wt 216.2 lb

## 2018-02-04 DIAGNOSIS — E559 Vitamin D deficiency, unspecified: Secondary | ICD-10-CM

## 2018-02-04 DIAGNOSIS — I2583 Coronary atherosclerosis due to lipid rich plaque: Secondary | ICD-10-CM

## 2018-02-04 DIAGNOSIS — Z0001 Encounter for general adult medical examination with abnormal findings: Secondary | ICD-10-CM | POA: Diagnosis not present

## 2018-02-04 DIAGNOSIS — R6889 Other general symptoms and signs: Secondary | ICD-10-CM | POA: Diagnosis not present

## 2018-02-04 DIAGNOSIS — I10 Essential (primary) hypertension: Secondary | ICD-10-CM

## 2018-02-04 DIAGNOSIS — E538 Deficiency of other specified B group vitamins: Secondary | ICD-10-CM

## 2018-02-04 DIAGNOSIS — E669 Obesity, unspecified: Secondary | ICD-10-CM

## 2018-02-04 DIAGNOSIS — Z952 Presence of prosthetic heart valve: Secondary | ICD-10-CM

## 2018-02-04 DIAGNOSIS — E66811 Obesity, class 1: Secondary | ICD-10-CM

## 2018-02-04 DIAGNOSIS — K429 Umbilical hernia without obstruction or gangrene: Secondary | ICD-10-CM

## 2018-02-04 DIAGNOSIS — Z951 Presence of aortocoronary bypass graft: Secondary | ICD-10-CM | POA: Diagnosis not present

## 2018-02-04 DIAGNOSIS — I2510 Atherosclerotic heart disease of native coronary artery without angina pectoris: Secondary | ICD-10-CM

## 2018-02-04 DIAGNOSIS — E782 Mixed hyperlipidemia: Secondary | ICD-10-CM

## 2018-02-04 DIAGNOSIS — Z79899 Other long term (current) drug therapy: Secondary | ICD-10-CM | POA: Diagnosis not present

## 2018-02-04 DIAGNOSIS — E1121 Type 2 diabetes mellitus with diabetic nephropathy: Secondary | ICD-10-CM

## 2018-02-04 DIAGNOSIS — Z7901 Long term (current) use of anticoagulants: Secondary | ICD-10-CM | POA: Diagnosis not present

## 2018-02-04 DIAGNOSIS — Z0000 Encounter for general adult medical examination without abnormal findings: Secondary | ICD-10-CM

## 2018-02-04 NOTE — Patient Instructions (Signed)
When it comes to diets, agreement about the perfect plan isn't easy to find, even among the experts. Experts at the [employer] developed an idea known as the Healthy Eating Plate. Just imagine a plate divided into logical, healthy portions.  The emphasis is on diet quality:  Load up on vegetables and fruits - one-half of your plate: Aim for color and variety, and remember that potatoes don't count.  Go for whole grains - one-quarter of your plate: Whole wheat, barley, wheat berries, quinoa, oats, brown rice, and foods made with them. If you want pasta, go with whole wheat pasta.  Protein power - one-quarter of your plate: Fish, chicken, beans, and nuts are all healthy, versatile protein sources. Limit red meat.  The diet, however, does go beyond the plate, offering a few other suggestions.  Use healthy plant oils, such as olive, canola, soy, corn, sunflower and peanut. Check the labels, and avoid partially hydrogenated oil, which have unhealthy trans fats.  If you're thirsty, drink water. Coffee and tea are good in moderation, but skip sugary drinks and limit milk and dairy products to one or two daily servings.  The type of carbohydrate in the diet is more important than the amount. Some sources of carbohydrates, such as vegetables, fruits, whole grains, and beans-are healthier than others.  Finally, stay active.    Diabetes Mellitus and Exercise Exercising regularly is important for your overall health, especially when you have diabetes (diabetes mellitus). Exercising is not only about losing weight. It has many health benefits, such as increasing muscle strength and bone density and reducing body fat and stress. This leads to improved fitness, flexibility, and endurance, all of which result in better overall health. Exercise has additional benefits for people with diabetes, including:  Reducing appetite.  Helping to lower and control blood  glucose.  Lowering blood pressure.  Helping to control amounts of fatty substances (lipids) in the blood, such as cholesterol and triglycerides.  Helping the body to respond better to insulin (improving insulin sensitivity).  Reducing how much insulin the body needs.  Decreasing the risk for heart disease by: ? Lowering cholesterol and triglyceride levels. ? Increasing the levels of good cholesterol. ? Lowering blood glucose levels.  What is my activity plan? Your health care provider or certified diabetes educator can help you make a plan for the type and frequency of exercise (activity plan) that works for you. Make sure that you:  Do at least 150 minutes of moderate-intensity or vigorous-intensity exercise each week. This could be brisk walking, biking, or water aerobics. ? Do stretching and strength exercises, such as yoga or weightlifting, at least 2 times a week. ? Spread out your activity over at least 3 days of the week.  Get some form of physical activity every day. ? Do not go more than 2 days in a row without some kind of physical activity. ? Avoid being inactive for more than 90 minutes at a time. Take frequent breaks to walk or stretch.  Choose a type of exercise or activity that you enjoy, and set realistic goals.  Start slowly, and gradually increase the intensity of your exercise over time.  What do I need to know about managing my diabetes?  Check your blood glucose before and after exercising. ? If your blood glucose is higher than 240 mg/dL (13.3 mmol/L) before you exercise, check your urine for ketones. If you have ketones in your urine, do not exercise until your  blood glucose returns to normal.  Know the symptoms of low blood glucose (hypoglycemia) and how to treat it. Your risk for hypoglycemia increases during and after exercise. Common symptoms of hypoglycemia can include: ? Hunger. ? Anxiety. ? Sweating and feeling clammy. ? Confusion. ? Dizziness or  feeling light-headed. ? Increased heart rate or palpitations. ? Blurry vision. ? Tingling or numbness around the mouth, lips, or tongue. ? Tremors or shakes. ? Irritability.  Keep a rapid-acting carbohydrate snack available before, during, and after exercise to help prevent or treat hypoglycemia.  Avoid injecting insulin into areas of the body that are going to be exercised. For example, avoid injecting insulin into: ? The arms, when playing tennis. ? The legs, when jogging.  Keep records of your exercise habits. Doing this can help you and your health care provider adjust your diabetes management plan as needed. Write down: ? Food that you eat before and after you exercise. ? Blood glucose levels before and after you exercise. ? The type and amount of exercise you have done. ? When your insulin is expected to peak, if you use insulin. Avoid exercising at times when your insulin is peaking.  When you start a new exercise or activity, work with your health care provider to make sure the activity is safe for you, and to adjust your insulin, medicines, or food intake as needed.  Drink plenty of water while you exercise to prevent dehydration or heat stroke. Drink enough fluid to keep your urine clear or pale yellow. This information is not intended to replace advice given to you by your health care provider. Make sure you discuss any questions you have with your health care provider. Document Released: 01/31/2004 Document Revised: 05/30/2016 Document Reviewed: 04/21/2016 Elsevier Interactive Patient Education  2018 [employer].

## 2018-02-05 LAB — CBC WITH DIFFERENTIAL/PLATELET
Basophils Absolute: 40 {cells}/uL (ref 0–200)
Basophils Relative: 0.7 %
Eosinophils Absolute: 91 {cells}/uL (ref 15–500)
Eosinophils Relative: 1.6 %
HCT: 44.8 % (ref 38.5–50.0)
Hemoglobin: 15.6 g/dL (ref 13.2–17.1)
Lymphs Abs: 1197 {cells}/uL (ref 850–3900)
MCH: 31.1 pg (ref 27.0–33.0)
MCHC: 34.8 g/dL (ref 32.0–36.0)
MCV: 89.2 fL (ref 80.0–100.0)
MPV: 11.5 fL (ref 7.5–12.5)
Monocytes Relative: 8.1 %
Neutro Abs: 3910 {cells}/uL (ref 1500–7800)
Neutrophils Relative %: 68.6 %
Platelets: 161 10*3/uL (ref 140–400)
RBC: 5.02 Million/uL (ref 4.20–5.80)
RDW: 14 % (ref 11.0–15.0)
Total Lymphocyte: 21 %
WBC mixed population: 462 {cells}/uL (ref 200–950)
WBC: 5.7 10*3/uL (ref 3.8–10.8)

## 2018-02-05 LAB — HEPATIC FUNCTION PANEL
AG Ratio: 2.2 (calc) (ref 1.0–2.5)
ALT: 20 U/L (ref 9–46)
AST: 21 U/L (ref 10–35)
Albumin: 4.3 g/dL (ref 3.6–5.1)
Alkaline phosphatase (APISO): 76 U/L (ref 40–115)
Bilirubin, Direct: 0.2 mg/dL (ref 0.0–0.2)
Globulin: 2 g/dL (ref 1.9–3.7)
Indirect Bilirubin: 0.5 mg/dL (ref 0.2–1.2)
Total Bilirubin: 0.7 mg/dL (ref 0.2–1.2)
Total Protein: 6.3 g/dL (ref 6.1–8.1)

## 2018-02-05 LAB — VITAMIN B12: Vitamin B-12: 355 pg/mL (ref 200–1100)

## 2018-02-05 LAB — BASIC METABOLIC PANEL WITHOUT GFR
BUN: 15 mg/dL (ref 7–25)
CO2: 26 mmol/L (ref 20–32)
Calcium: 9.3 mg/dL (ref 8.6–10.3)
Chloride: 108 mmol/L (ref 98–110)
Creat: 0.79 mg/dL (ref 0.70–1.18)
GFR, Est African American: 102 mL/min/{1.73_m2}
GFR, Est Non African American: 88 mL/min/{1.73_m2}
Glucose, Bld: 128 mg/dL — ABNORMAL HIGH (ref 65–99)
Potassium: 4.5 mmol/L (ref 3.5–5.3)
Sodium: 143 mmol/L (ref 135–146)

## 2018-02-05 LAB — LIPID PANEL
Cholesterol: 155 mg/dL
HDL: 37 mg/dL — ABNORMAL LOW
LDL Cholesterol (Calc): 96 mg/dL
Non-HDL Cholesterol (Calc): 118 mg/dL
Total CHOL/HDL Ratio: 4.2 (calc)
Triglycerides: 123 mg/dL

## 2018-02-05 LAB — VITAMIN D 25 HYDROXY (VIT D DEFICIENCY, FRACTURES): Vit D, 25-Hydroxy: 22 ng/mL — ABNORMAL LOW (ref 30–100)

## 2018-02-05 LAB — TSH: TSH: 1.17 m[IU]/L (ref 0.40–4.50)

## 2018-02-05 LAB — HEMOGLOBIN A1C
Hgb A1c MFr Bld: 7.3 %{Hb} — ABNORMAL HIGH
Mean Plasma Glucose: 163 (calc)
eAG (mmol/L): 9 (calc)

## 2018-02-16 ENCOUNTER — Ambulatory Visit (INDEPENDENT_AMBULATORY_CARE_PROVIDER_SITE_OTHER): Payer: PPO | Admitting: Pharmacist

## 2018-02-16 DIAGNOSIS — Z7901 Long term (current) use of anticoagulants: Secondary | ICD-10-CM

## 2018-02-16 DIAGNOSIS — Z952 Presence of prosthetic heart valve: Secondary | ICD-10-CM | POA: Diagnosis not present

## 2018-02-16 LAB — POCT INR: INR: 2.4

## 2018-02-26 ENCOUNTER — Other Ambulatory Visit: Payer: Self-pay | Admitting: Internal Medicine

## 2018-03-04 ENCOUNTER — Other Ambulatory Visit: Payer: Self-pay | Admitting: Internal Medicine

## 2018-03-04 MED ORDER — RANITIDINE HCL 300 MG PO TABS: ORAL_TABLET | ORAL | 3 refills | Status: DC

## 2018-03-04 MED ORDER — METFORMIN HCL ER 500 MG PO TB24: ORAL_TABLET | ORAL | 1 refills | Status: DC

## 2018-03-23 ENCOUNTER — Ambulatory Visit (INDEPENDENT_AMBULATORY_CARE_PROVIDER_SITE_OTHER): Payer: PPO | Admitting: Pharmacist Clinician (PhC)/ Clinical Pharmacy Specialist

## 2018-03-23 DIAGNOSIS — Z7901 Long term (current) use of anticoagulants: Secondary | ICD-10-CM

## 2018-03-23 DIAGNOSIS — Z952 Presence of prosthetic heart valve: Secondary | ICD-10-CM

## 2018-03-23 LAB — POCT INR: INR: 2.6

## 2018-03-23 NOTE — Patient Instructions (Signed)
Description   Continue with 1 tablet daily except 1/2 tablet each Sunday, Tuesday and Thursday.  Repeat INR in 4 weeks.

## 2018-04-16 ENCOUNTER — Other Ambulatory Visit: Payer: Self-pay | Admitting: Internal Medicine

## 2018-04-21 ENCOUNTER — Ambulatory Visit (INDEPENDENT_AMBULATORY_CARE_PROVIDER_SITE_OTHER): Payer: PPO | Admitting: Pharmacist

## 2018-04-21 DIAGNOSIS — Z952 Presence of prosthetic heart valve: Secondary | ICD-10-CM | POA: Diagnosis not present

## 2018-04-21 DIAGNOSIS — Z7901 Long term (current) use of anticoagulants: Secondary | ICD-10-CM

## 2018-04-21 LAB — POCT INR: INR: 3 (ref 2.0–3.0)

## 2018-05-10 ENCOUNTER — Encounter: Payer: Self-pay | Admitting: Internal Medicine

## 2018-05-10 NOTE — Patient Instructions (Signed)
Recommend Adult Low Dose Aspirin or  coated  Aspirin 81 mg daily  To reduce risk of Colon Cancer 20 %,  Skin Cancer 26 % ,  Melanoma 46%  and  Pancreatic cancer 60% +++++++++++++++++++++++++ Vitamin D goal  is between 70-100.  Please make sure that you are taking your Vitamin D as directed.  It is very important as a natural anti-inflammatory  helping hair, skin, and nails, as well as reducing stroke and heart attack risk.  It helps your bones and helps with mood. It also decreases numerous cancer risks so please take it as directed.  Low Vit D is associated with a 200-300% higher risk for CANCER  and 200-300% higher risk for HEART   ATTACK  &  STROKE.   ...................................... It is also associated with higher death rate at younger ages,  autoimmune diseases like Rheumatoid arthritis, Lupus, Multiple Sclerosis.    Also many other serious conditions, like depression, Alzheimer's Dementia, infertility, muscle aches, fatigue, fibromyalgia - just to name a few. ++++++++++++++++++++ Recommend the book "The END of DIETING" by Dr Joel Fuhrman  & the book "The END of DIABETES " by Dr Joel Fuhrman At [employer] - get book & Audio CD's    Being diabetic has a  300% increased risk for heart attack, stroke, cancer, and alzheimer- type vascular dementia. It is very important that you work harder with diet by avoiding all foods that are white. Avoid white rice (brown & wild rice is OK), white potatoes (sweetpotatoes in moderation is OK), White bread or wheat bread or anything made out of white flour like bagels, donuts, rolls, buns, biscuits, cakes, pastries, cookies, pizza crust, and pasta (made from white flour & egg whites) - vegetarian pasta or spinach or wheat pasta is OK. Multigrain breads like Arnold's or [employer], or multigrain sandwich thins or flatbreads.  Diet, exercise and weight loss can reverse and cure diabetes in the early stages.  Diet, exercise and weight loss is  very important in the control and prevention of complications of diabetes which affects every system in your body, ie. Brain - dementia/stroke, eyes - glaucoma/blindness, heart - heart attack/heart failure, kidneys - dialysis, stomach - gastric paralysis, intestines - malabsorption, nerves - severe painful neuritis, circulation - gangrene & loss of a leg(s), and finally cancer and Alzheimers.    I recommend avoid fried & greasy foods,  sweets/candy, white rice (brown or wild rice or Quinoa is OK), white potatoes (sweet potatoes are OK) - anything made from white flour - bagels, doughnuts, rolls, buns, biscuits,white and wheat breads, pizza crust and traditional pasta made of white flour & egg white(vegetarian pasta or spinach or wheat pasta is OK).  Multi-grain bread is OK - like multi-grain flat bread or sandwich thins. Avoid alcohol in excess. Exercise is also important.    Eat all the vegetables you want - avoid meat, especially red meat and dairy - especially cheese.  Cheese is the most concentrated form of trans-fats which is the worst thing to clog up our arteries. Veggie cheese is OK which can be found in the fresh produce section at [employer] or [employer] or [employer]  +++++++++++++++++++++ DASH Eating Plan  DASH stands for "Dietary Approaches to Stop Hypertension."   The DASH eating plan is a healthy eating plan that has been shown to reduce high blood pressure (hypertension). Additional health benefits may include reducing the risk of type 2 diabetes mellitus, heart disease, and stroke. The DASH eating plan may also   help with weight loss. WHAT DO I NEED TO KNOW ABOUT THE DASH EATING PLAN? For the DASH eating plan, you will follow these general guidelines:  Choose foods with a percent daily value for sodium of less than 5% (as listed on the food label).  Use salt-free seasonings or herbs instead of table salt or sea salt.  Check with your health care provider or pharmacist before  using salt substitutes.  Eat lower-sodium products, often labeled as "lower sodium" or "no salt added."  Eat fresh foods.  Eat more vegetables, fruits, and low-fat dairy products.  Choose whole grains. Look for the word "whole" as the first word in the ingredient list.  Choose fish   Limit sweets, desserts, sugars, and sugary drinks.  Choose heart-healthy fats.  Eat veggie cheese   Eat more home-cooked food and less restaurant, buffet, and fast food.  Limit fried foods.  Cook foods using methods other than frying.  Limit canned vegetables. If you do use them, rinse them well to decrease the sodium.  When eating at a restaurant, ask that your food be prepared with less salt, or no salt if possible.                      WHAT FOODS CAN I EAT? Read Dr Joel Fuhrman's books on The End of Dieting & The End of Diabetes  Grains Whole grain or whole wheat bread. Brown rice. Whole grain or whole wheat pasta. Quinoa, bulgur, and whole grain cereals. Low-sodium cereals. Corn or whole wheat flour tortillas. Whole grain cornbread. Whole grain crackers. Low-sodium crackers.  Vegetables Fresh or frozen vegetables (raw, steamed, roasted, or grilled). Low-sodium or reduced-sodium tomato and vegetable juices. Low-sodium or reduced-sodium tomato sauce and paste. Low-sodium or reduced-sodium canned vegetables.   Fruits All fresh, canned (in natural juice), or frozen fruits.  Protein Products  All fish and seafood.  Dried beans, peas, or lentils. Unsalted nuts and seeds. Unsalted canned beans.  Dairy Low-fat dairy products, such as skim or 1% milk, 2% or reduced-fat cheeses, low-fat ricotta or cottage cheese, or plain low-fat yogurt. Low-sodium or reduced-sodium cheeses.  Fats and Oils Tub margarines without trans fats. Light or reduced-fat mayonnaise and salad dressings (reduced sodium). Avocado. Safflower, olive, or canola oils. Natural peanut or almond butter.  Other Unsalted popcorn  and pretzels. The items listed above may not be a complete list of recommended foods or beverages. Contact your dietitian for more options.  +++++++++++++++  WHAT FOODS ARE NOT RECOMMENDED? Grains/ White flour or wheat flour White bread. White pasta. White rice. Refined cornbread. Bagels and croissants. Crackers that contain trans fat.  Vegetables  Creamed or fried vegetables. Vegetables in a . Regular canned vegetables. Regular canned tomato sauce and paste. Regular tomato and vegetable juices.  Fruits Dried fruits. Canned fruit in light or heavy syrup. Fruit juice.  Meat and Other Protein Products Meat in general - RED meat & White meat.  Fatty cuts of meat. Ribs, chicken wings, all processed meats as bacon, sausage, bologna, salami, fatback, hot dogs, bratwurst and packaged luncheon meats.  Dairy Whole or 2% milk, cream, half-and-half, and cream cheese. Whole-fat or sweetened yogurt. Full-fat cheeses or blue cheese. Non-dairy creamers and whipped toppings. Processed cheese, cheese spreads, or cheese curds.  Condiments Onion and garlic salt, seasoned salt, table salt, and sea salt. Canned and packaged gravies. Worcestershire sauce. Tartar sauce. Barbecue sauce. Teriyaki sauce. Soy sauce, including reduced sodium. Steak sauce. Fish sauce. Oyster sauce. Cocktail   sauce. Horseradish. Ketchup and mustard. Meat flavorings and tenderizers. Bouillon cubes. Hot sauce. Tabasco sauce. Marinades. Taco seasonings. Relishes.  Fats and Oils Butter, stick margarine, lard, shortening and bacon fat. Coconut, palm kernel, or palm oils. Regular salad dressings.  Pickles and olives. Salted popcorn and pretzels.  The items listed above may not be a complete list of foods and beverages to avoid.   Bleeding Precautions When on Anticoagulant Therapy WHAT IS ANTICOAGULANT THERAPY? Anticoagulant therapy is taking medicine to prevent or reduce blood clots. It is also called blood thinner therapy. Blood  clots that form in your blood vessels can be dangerous. They can break loose and travel to your heart, lungs, or brain. This increases your risk of a heart attack or stroke. Anticoagulant therapy causes blood to clot more slowly. You may need anticoagulant therapy if you have:  A medical condition that increases the likelihood that blood clots will form.  A heart defect or a problem with heart rhythm. It is also a common treatment after heart surgery, such as valve replacement. WHAT ARE COMMON TYPES OF ANTICOAGULANT THERAPY? Anticoagulant medicine can be injected or taken by mouth.If you need anticoagulant therapy quickly at the hospital, the medicine may be injected under your skin or given through an IV tube. Heparin is a common example of an anticoagulant that you may get at the hospital. Most anticoagulant therapy is in the form of pills that you take at home every day. These may include:  Aspirin. This common blood thinner works by preventing blood cells (platelets) from sticking together to form a clot. Aspirin is not as strong as anticoagulants that slow down the time that it takes for your body to form a clot.  Clopidogrel. This is a newer type of drug that affects platelets. It is stronger than aspirin.  Warfarin. This is the most common anticoagulant. It changes the way your body uses vitamin K, a vitamin that helps your blood to clot. The risk of bleeding is higher with warfarin than with aspirin. You will need frequent blood tests to make sure you are taking the safest amount.  New anticoagulants. Several new drugs have been approved. They are all taken by mouth. Studies show that these drugs work as well as warfarin. They do not require blood testing. They may cause less bleeding risk than warfarin. WHAT DO I NEED TO REMEMBER WHEN TAKING ANTICOAGULANT THERAPY? Anticoagulant therapy decreases your risk of forming a blood clot, but it increases your risk of bleeding. Work closely with  your health care provider to make sure you are taking your medicine safely. These tips can help:  Learn ways to reduce your risk of bleeding.  If you are taking warfarin: ? Have blood tests as ordered by your health care provider. ? Do not make any sudden changes to your diet. Vitamin K in your diet can make warfarin less effective. ? Do not get pregnant. This medicine may cause birth defects.  Take your medicine at the same time every day. If you forget to take your medicine, take it as soon as you remember. If you miss a whole day, do not double your dose of medicine. Take your normal dose and call your health care provider to check in.  Do not stop taking your medicine on your own.  Tell your health care provider before you start taking any new medicine, vitamin, or herbal product. Some of these could interfere with your therapy.  Tell all of your health care providers that  you are on anticoagulant therapy.  Do not have surgery, medical procedures, or dental work until you tell your health care provider that you are on anticoagulant therapy. WHAT CAN AFFECT HOW ANTICOAGULANTS WORK? Certain foods, vitamins, medicines, supplements, and herbal medicines change the way that anticoagulant therapy works. They may increase or decrease the effects of your anticoagulant therapy. Either result can be dangerous for you.  Many over-the-counter medicines for pain, colds, or stomach problems interfere with anticoagulant therapy. Take these only as told by your health care provider.  Do not drink alcohol. It can interfere with your medicine and increase your risk of an injury that causes bleeding.  If you are taking warfarin, do not begin eating more foods that contain vitamin K. These include leafy green vegetables. Ask your health care provider if you should avoid any foods. WHAT ARE SOME WAYS TO PREVENT BLEEDING? You can prevent bleeding by taking certain precautions:  Be extra careful when you  use knives, scissors, or other sharp objects.  Use an electric razor instead of a blade.  Do not use toothpicks.  Use a soft toothbrush.  Wear shoes that have nonskid soles.  Use bath mats and handrails in your bathroom.  Wear gloves while you do yard work.  Wear a helmet when you ride a bike.  Wear your seat belt.  Prevent falls by removing loose rugs and extension cords from areas where you walk.  Do not play contact sports or participate in other activities that have a high risk of injury. [place] PROVIDER? Call your health care provider if:  You miss a dose of medicine: ? And you are not sure what to do. ? For more than one day.  You have: ? Menstrual bleeding that is heavier than normal. ? Blood in your urine. ? A bloody nose or bleeding gums. ? Easy bruising. ? Blood in your stool (feces) or have black and tarry stool. ? Side effects from your medicine.  You feel weak or dizzy.  You become pregnant. Seek immediate medical care if:  You have bleeding that will not stop.  You have sudden and severe headache or belly pain.  You vomit or you cough up bright red blood.  You have a severe blow to your head. WHAT ARE SOME QUESTIONS TO ASK MY HEALTH CARE PROVIDER?  What is the best anticoagulant therapy for my condition?  What side effects should I watch for?  When should I take my medicine? What should I do if I forget to take it?  Will I need to have regular blood tests?  Do I need to change my diet? Are there foods or drinks that I should avoid?  What activities are safe for me?  What should I do if I want to get pregnant? This information is not intended to replace advice given to you by your health care provider. Make sure you discuss any questions you have with your health care provider. Document Released: 10/22/2015 Document Reviewed: 10/22/2015 Elsevier Interactive Patient Education  2017 [employer].

## 2018-05-10 NOTE — Progress Notes (Signed)
This very nice 77 y.o. MWM presents for 6 month follow up with HTN, ASCAD/CABG & AoVR,  HLD, Pre-Diabetes and Vitamin D Deficiency.      Patient is treated for HTN (1994)  & BP has been controlled at home. Today's BP is at goal - 122/68. He underwent CABG & Mech AoVR in 2004.  His coags are followed at the [facility]. He had a neg Myoview in 2016 by Dr Debara Pickett. Patient has had no complaints of any cardiac type chest pain, palpitations, dyspnea / orthopnea / PND, dizziness, claudication, or dependent edema.     Hyperlipidemia is controlled with diet & meds. Patient denies myalgias or other med SE's. 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  Last A1c was  Lab Results  Component Value Date   HGBA1C 7.3 (H) 02/04/2018      Further, the patient also has history of Vitamin D Deficiency ("28"/2012)  and he does not supplement vitamin D  Ars advised. Last vitamin D was still very low: Lab Results  Component Value Date   VD25OH 22 (L) 02/04/2018   Current Outpatient Medications on File Prior to Visit  Medication Sig  . aspirin 81 MG tablet Take 81 mg by mouth daily.    . clotrimazole-betamethasone (LOTRISONE) cream APPLY TO AFFECTED AREA TWICE A DAY FOR 2 WEEKS  . cyanocobalamin (,VITAMIN B-12,) 1000 MCG/ML injection INJECT 1/2 ML EVERY MONTH  . enalapril (VASOTEC) 20 MG tablet TAKE 2 TABLETS BY MOUTH EVERY DAY  . FARXIGA 10 MG TABS tablet TAKE 1 TABLET BY MOUTH EVERY DAY FOR DIABETES  . glipiZIDE (GLUCOTROL) 10 MG tablet TAKE 1 TABLET BY MOUTH 3 TIMES A DAY BEFORE MEALS  . glucose blood (ONE TOUCH ULTRA TEST) test strip CHECK BLOOD SUGAR ONCE DAILY  .  metFORMIN (GLUCOPHAGE-XR) 500 MG 24 hr tablet TAKE 2 TABLETS BY MOUTH TWICE A DAY WITH A MEAL  . metoprolol succinate (TOPROL-XL) 50 MG 24 hr tablet Take 1 tablet (50 mg total) by mouth daily. Take with or immediately following a meal.  . ranitidine (ZANTAC) 300 MG tablet Take 1 tablet 1 to 2 x/ day for Heartburn / Indigestion  . simvastatin (ZOCOR) 40 MG tablet Take 1 tablet (40 mg total) by mouth daily.  Marland Kitchen terbinafine (LAMISIL) 250 MG tablet TAKE 1 TABLET BY MOUTH DAILY FOR 1 MONTH ON, 1 MONTH OFF UNTIL RUN OUT  . warfarin (COUMADIN) 5 MG tablet TAKE 1/2 TO 1 TABLET BY MOUTH DAILY OR AS DIRECTED   No current facility-administered medications on file prior to visit.    No Known Allergies   PMHx:   Past Medical History:  Diagnosis Date  . Anemia   . AS (aortic stenosis)    valve replacement-st. jude mechanical valve  . ASHD (arteriosclerotic heart disease)   . CAD (coronary artery disease), native coronary artery    CABG 07/18/2003  . Cancer [facility])    prostate  . GERD (gastroesophageal reflux disease)   . History of nuclear stress test 02/12/10   EF 67%, normal perfusion  . Hyperlipidemia   .  Hypertension   . Morbid obesity ([place])   . Type II or unspecified type diabetes mellitus without mention of complication, not stated as uncontrolled   . Umbilical hernia   . Vitamin B12 deficiency   . Vitamin D deficiency    Immunization History  Administered Date(s) Administered  . Influenza Whole 09/01/2012  . Influenza, High Dose Seasonal PF 09/27/2015  . Influenza-Unspecified 08/25/2016  . Pneumococcal Conjugate-13 12/18/2015  . Pneumococcal Polysaccharide-23 09/01/2012  . Tdap 11/25/2007   Past Surgical History:  Procedure Laterality Date  . AORTIC VALVE REPLACEMENT  07/18/2003   87mm ATS-AP valve  . AORTIC VALVE REPLACEMENT (AVR)/CORONARY ARTERY BYPASS GRAFTING (CABG)  07/18/2003   AVR; bypass x 2-SVG to first diag, SVG to posterior descending  . CARDIAC CATHETERIZATION   07/13/2003   AS-aortic valve gradient 35mmhg, valve area 1.1; obstruction of ostium of posterior lateral branch with obstruction in the ostium of first diagnonal  . HERNIA REPAIR  14/97/0263   RIH, umbilical  . NM MYOCAR PERF WALL MOTION  02/12/2010   protocol:Bruce, EF 67%, exercise cap 7 METS, low risk scan  . TEE WITHOUT CARDIOVERSION  07/18/2003   no evidence of PE, Aortic valve heavily calcified,   . TRANSTHORACIC ECHOCARDIOGRAM  02/21/2013   EF60-65%, Septal motion showed paradox. atrium moderately dilated   FHx:    Reviewed / unchanged  SHx:    Reviewed / unchanged  Systems Review:  Constitutional: Denies fever, chills, wt changes, headaches, insomnia, fatigue, night sweats, change in appetite. Eyes: Denies redness, blurred vision, diplopia, discharge, itchy, watery eyes.  ENT: Denies discharge, congestion, post nasal drip, epistaxis, sore throat, earache, hearing loss, dental pain, tinnitus, vertigo, sinus pain, snoring.  CV: Denies chest pain, palpitations, irregular heartbeat, syncope, dyspnea, diaphoresis, orthopnea, PND, claudication or edema. Respiratory: denies cough, dyspnea, DOE, pleurisy, hoarseness, laryngitis, wheezing.  Gastrointestinal: Denies dysphagia, odynophagia, heartburn, reflux, water brash, abdominal pain or cramps, nausea, vomiting, bloating, diarrhea, constipation, hematemesis, melena, hematochezia  or hemorrhoids. Genitourinary: Denies dysuria, frequency, urgency, nocturia, hesitancy, discharge, hematuria or flank pain. Musculoskeletal: Denies arthralgias, myalgias, stiffness, jt. swelling, pain, limping or strain/sprain.  Skin: Denies pruritus, rash, hives, warts, acne, eczema or change in skin lesion(s). Neuro: No weakness, tremor, incoordination, spasms, paresthesia or pain. Psychiatric: Denies confusion, memory loss or sensory loss. Endo: Denies change in weight, skin or hair change.  Heme/Lymph: No excessive bleeding, bruising or enlarged lymph  nodes.  Physical Exam  BP 122/68   Pulse (!) 52   Temp (!) 97.2 F (36.2 C)   Resp 18   Ht 5' 9.5" (1.765 m)   Wt 218 lb 3.2 oz (99 kg)   BMI 31.76 kg/m   Appears  well nourished, well groomed  and in no distress.  Eyes: PERRLA, EOMs, conjunctiva no swelling or erythema. Sinuses: No frontal/maxillary tenderness ENT/Mouth: EAC's clear, TM's nl w/o erythema, bulging. Nares clear w/o erythema, swelling, exudates. Oropharynx clear without erythema or exudates. Oral hygiene is good. Tongue normal, non obstructing. Hearing intact.  Neck: Supple. Thyroid not palpable. Car 2+/2+ without bruits, nodes or JVD. Chest: Respirations nl with BS clear & equal w/o rales, rhonchi, wheezing or stridor.  Cor: Heart sounds normal w/ regular rate and rhythm without sig. murmurs, gallops, clicks or rubs. Peripheral pulses normal and equal  without edema.  Abdomen: Soft & bowel sounds normal. Non-tender w/o guarding, rebound, hernias, masses or organomegaly.  Lymphatics: Unremarkable.  Musculoskeletal: Full ROM all peripheral extremities, joint stability, 5/5 strength and normal gait.  Skin: Warm,  dry without exposed rashes, lesions or ecchymosis apparent.  Neuro: Cranial nerves intact, reflexes equal bilaterally. Sensory-motor testing grossly intact. Tendon reflexes grossly intact.  Pysch: Alert & oriented x 3.  Insight and judgement nl & appropriate. No ideations.  Assessment and Plan:  1. Essential hypertension  - Continue medication, monitor blood pressure at home.  - Continue DASH diet.  Reminder to go to the ER if any CP,  SOB, nausea, dizziness, severe HA, changes vision/speech.  - CBC with Differential/Platelet - COMPLETE METABOLIC PANEL WITH GFR - Magnesium - TSH  2. Hyperlipidemia, mixed  - Continue diet/meds, exercise,& lifestyle modifications.  - Continue monitor periodic cholesterol/liver & renal functions   - Lipid panel - TSH  3. Type 2 diabetes mellitus with stage 2 chronic  kidney disease, without long-term current use of insulin (HCC)  - Continue diet, exercise, lifestyle modifications.  - Monitor appropriate labs.  - Hemoglobin A1c - Insulin, random  4. Vitamin D deficiency  - Continue supplementation.   - VITAMIN D 25 Hydroxyl  5. Vitamin B12 deficiency  - Vitamin B12  6. Coronary artery disease due to lipid rich plaque  - Lipid panel  7. H/O mechanical aortic valve replacement   8. Medication management  - CBC with Differential/Platelet - COMPLETE METABOLIC PANEL WITH GFR - Magnesium - Lipid panel - TSH - Hemoglobin A1c - Insulin, random - VITAMIN D 25 Hydroxyl - Vitamin B12          Discussed  regular exercise, BP monitoring, weight control to achieve/maintain BMI less than 25 and discussed med and SE's. Recommended labs to assess and monitor clinical status with further disposition pending results of labs. Over 30 minutes of exam, counseling, chart review was performed.

## 2018-05-11 ENCOUNTER — Encounter: Payer: Self-pay | Admitting: Internal Medicine

## 2018-05-11 ENCOUNTER — Ambulatory Visit (INDEPENDENT_AMBULATORY_CARE_PROVIDER_SITE_OTHER): Payer: PPO | Admitting: Internal Medicine

## 2018-05-11 VITALS — BP 122/68 | HR 52 | Temp 97.2°F | Resp 18 | Ht 69.5 in | Wt 218.2 lb

## 2018-05-11 DIAGNOSIS — Z952 Presence of prosthetic heart valve: Secondary | ICD-10-CM

## 2018-05-11 DIAGNOSIS — Z79899 Other long term (current) drug therapy: Secondary | ICD-10-CM

## 2018-05-11 DIAGNOSIS — N182 Chronic kidney disease, stage 2 (mild): Secondary | ICD-10-CM

## 2018-05-11 DIAGNOSIS — E782 Mixed hyperlipidemia: Secondary | ICD-10-CM

## 2018-05-11 DIAGNOSIS — I2510 Atherosclerotic heart disease of native coronary artery without angina pectoris: Secondary | ICD-10-CM | POA: Diagnosis not present

## 2018-05-11 DIAGNOSIS — E559 Vitamin D deficiency, unspecified: Secondary | ICD-10-CM | POA: Diagnosis not present

## 2018-05-11 DIAGNOSIS — I2583 Coronary atherosclerosis due to lipid rich plaque: Secondary | ICD-10-CM | POA: Diagnosis not present

## 2018-05-11 DIAGNOSIS — E1122 Type 2 diabetes mellitus with diabetic chronic kidney disease: Secondary | ICD-10-CM

## 2018-05-11 DIAGNOSIS — I10 Essential (primary) hypertension: Secondary | ICD-10-CM | POA: Diagnosis not present

## 2018-05-11 DIAGNOSIS — E538 Deficiency of other specified B group vitamins: Secondary | ICD-10-CM

## 2018-05-15 ENCOUNTER — Encounter: Payer: Self-pay | Admitting: Internal Medicine

## 2018-05-18 ENCOUNTER — Ambulatory Visit (INDEPENDENT_AMBULATORY_CARE_PROVIDER_SITE_OTHER): Payer: PPO | Admitting: Pharmacist Clinician (PhC)/ Clinical Pharmacy Specialist

## 2018-05-18 DIAGNOSIS — Z7901 Long term (current) use of anticoagulants: Secondary | ICD-10-CM

## 2018-05-18 DIAGNOSIS — Z952 Presence of prosthetic heart valve: Secondary | ICD-10-CM

## 2018-05-18 LAB — POCT INR: INR: 2.3 (ref 2.0–3.0)

## 2018-06-08 ENCOUNTER — Other Ambulatory Visit: Payer: Self-pay | Admitting: Internal Medicine

## 2018-06-22 ENCOUNTER — Ambulatory Visit: Payer: PPO | Admitting: Pharmacist

## 2018-06-22 DIAGNOSIS — Z952 Presence of prosthetic heart valve: Secondary | ICD-10-CM | POA: Diagnosis not present

## 2018-06-22 DIAGNOSIS — Z7901 Long term (current) use of anticoagulants: Secondary | ICD-10-CM | POA: Diagnosis not present

## 2018-06-22 LAB — POCT INR: INR: 2.4 (ref 2.0–3.0)

## 2018-07-20 ENCOUNTER — Ambulatory Visit (INDEPENDENT_AMBULATORY_CARE_PROVIDER_SITE_OTHER): Payer: PPO | Admitting: Pharmacist

## 2018-07-20 DIAGNOSIS — Z7901 Long term (current) use of anticoagulants: Secondary | ICD-10-CM

## 2018-07-20 DIAGNOSIS — Z952 Presence of prosthetic heart valve: Secondary | ICD-10-CM

## 2018-07-20 LAB — POCT INR: INR: 2.6 (ref 2.0–3.0)

## 2018-07-22 ENCOUNTER — Other Ambulatory Visit: Payer: Self-pay | Admitting: Physician Assistant

## 2018-08-09 NOTE — Progress Notes (Signed)
Assessment and Plan:  Essential hypertension - continue medications, DASH diet, exercise and monitor at home. Call if greater than 130/80.  -     CBC with Differential/Platelet -     BASIC METABOLIC PANEL WITH GFR -     Hepatic function panel -     TSH  Type II diabetes mellitus with nephropathy ([place]) Discussed general issues about diabetes pathophysiology and management., Educational material distributed., Suggested low cholesterol diet., Encouraged aerobic exercise., Discussed foot care., Reminded to get yearly retinal exam. -     BASIC METABOLIC PANEL WITH GFR -     Hemoglobin A1c - NOT AT GOAL - STOPPED FARXIGA RECENTLY- WILL LIKELY NEED ADD ON - RESISTANT TO START INSULIN- STILL DRIVING- DISCUSSED CAN NOT DRIVE WITH A HIGH Q2V EITHER  Morbid obesity, unspecified obesity type ([place]) Obesity with co morbidities- long discussion about weight loss, diet, and exercise STOP DIET SODA, STOP SNACKING AT NIGHT  Medication management Continue supplement  Hyperlipidemia -continue medications, check lipids, decrease fatty foods, increase activity.  -     Lipid panel - MAY NEED TO SWITCH TO DIFFERENT STATIN  Continue diet and meds as discussed. Further disposition pending results of labs. Discussed med's effects and SE's.   HPI 77 y.o. male  presents for 3 month follow up with hypertension, hyperlipidemia, diabetes and vitamin D. His blood pressure has been controlled at home, today their BP is BP: 140/70 He does not workout. He denies chest pain, shortness of breath, dizziness.   He has history of AVR and follows with coumadin clinic once a month. Following with Dr. Debara Pickett.  He has a history of CAD s/p CABG, follows with cardio.   He is on cholesterol medication, simvastatin 40mg  and denies myalgias. His cholesterol is not at goal. The cholesterol was:  05/11/2018: Cholesterol 148; HDL 39; LDL Cholesterol (Calc) 90; Triglycerides 101   He has been working on diet and exercise for  Diabetes with diabetic nephropathy and with other circulatory complications but last time kidney function was better with stopping the HCTZ, he is on bASA, he is on ACE/ARB,he is on cinnamon 2000 TID, he is off farxiga due to yeast infections, metformin 500mg  x 4, glipizide 10mg  3 x a day, Does not want to get on insulin because still driving, checking sugars 1-2 x a week in the AM, running 200's in the morning and denies polydipsia, polyuria and visual disturbances. Last A1C was: 05/11/2018: Hgb A1c MFr Bld 8.2   Lab Results  Component Value Date   GFRNONAA 85 05/11/2018   Patient is not on Vitamin D supplement, he is on 15,000 a day or 5000 IU 3 x a day with his cinnamon.  05/11/2018: Vit D, 25-Hydroxy 25   BMI is Body mass index is 32.6 kg/m., he is working on diet and exercise. Wt Readings from Last 3 Encounters:  08/11/18 224 lb (101.6 kg)  05/11/18 218 lb 3.2 oz (99 kg)  02/04/18 216 lb 3.2 oz (98.1 kg)    Current Medications:  Current Outpatient Medications on File Prior to Visit  Medication Sig Dispense Refill  . aspirin 81 MG tablet Take 81 mg by mouth daily.      . clotrimazole-betamethasone (LOTRISONE) cream APPLY TO AFFECTED AREA TWICE A DAY FOR 2 WEEKS 45 g 3  . cyanocobalamin (,VITAMIN B-12,) 1000 MCG/ML injection INJECT 1/2 ML EVERY MONTH 10 mL 2  . enalapril (VASOTEC) 20 MG tablet TAKE 2 TABLETS BY MOUTH EVERY DAY 180 tablet 1  .  FARXIGA 10 MG TABS tablet TAKE 1 TABLET BY MOUTH EVERY DAY FOR DIABETES 30 tablet 2  . glipiZIDE (GLUCOTROL) 10 MG tablet TAKE 1 TABLET BY MOUTH 3 TIMES A DAY BEFORE MEALS 270 tablet 1  . glucose blood (ONE TOUCH ULTRA TEST) test strip CHECK BLOOD SUGAR ONCE DAILY 100 each 1  . metFORMIN (GLUCOPHAGE-XR) 500 MG 24 hr tablet TAKE 2 TABLETS BY MOUTH TWICE A DAY WITH A MEAL 360 tablet 1  . metoprolol succinate (TOPROL-XL) 50 MG 24 hr tablet Take 1 tablet (50 mg total) by mouth daily. Take with or immediately following a meal. 90 tablet 3  . ranitidine  (ZANTAC) 300 MG tablet Take 1 tablet 1 to 2 x/ day for Heartburn / Indigestion 180 tablet 3  . simvastatin (ZOCOR) 40 MG tablet Take 1 tablet (40 mg total) by mouth daily. 90 tablet 3  . terbinafine (LAMISIL) 250 MG tablet TAKE 1 TABLET BY MOUTH DAILY FOR 1 MONTH ON, 1 MONTH OFF UNTIL RUN OUT 90 tablet 0  . warfarin (COUMADIN) 5 MG tablet TAKE 1/2 TO 1 TABLET BY MOUTH DAILY OR AS DIRECTED 90 tablet 1   No current facility-administered medications on file prior to visit.    Medical History:  Past Medical History:  Diagnosis Date  . Anemia   . AS (aortic stenosis)    valve replacement-st. jude mechanical valve  . ASHD (arteriosclerotic heart disease)   . CAD (coronary artery disease), native coronary artery    CABG 07/18/2003  . Cancer [facility])    prostate  . GERD (gastroesophageal reflux disease)   . History of nuclear stress test 02/12/10   EF 67%, normal perfusion  . Hyperlipidemia   . Hypertension   . Morbid obesity ([place])   . Type II or unspecified type diabetes mellitus without mention of complication, not stated as uncontrolled   . Umbilical hernia   . Vitamin B12 deficiency   . Vitamin D deficiency    Allergies: No Known Allergies   Review of Systems:  Review of Systems  Constitutional: Negative.   HENT: Negative.   Eyes: Negative.   Respiratory: Negative.   Cardiovascular: Negative.   Gastrointestinal: Negative.   Genitourinary: Negative for dysuria, flank pain, frequency, hematuria and urgency.  Musculoskeletal: Negative.   Skin: Negative.   Neurological: Negative.   Endo/Heme/Allergies: Negative.   Psychiatric/Behavioral: Negative.     Family history- Review and unchanged Social history- Review and unchanged Physical Exam: BP 140/70   Pulse 66   Temp 98.5 F (36.9 C)   Ht 5' 9.5" (1.765 m)   Wt 224 lb (101.6 kg)   SpO2 95%   BMI 32.60 kg/m  Wt Readings from Last 3 Encounters:  08/11/18 224 lb (101.6 kg)  05/11/18 218 lb 3.2 oz (99 kg)  02/04/18 216 lb  3.2 oz (98.1 kg)   General Appearance: Well nourished, in no apparent distress. Eyes: PERRLA, EOMs, conjunctiva no swelling or erythema Sinuses: No Frontal/maxillary tenderness ENT/Mouth: Ext aud canals clear, TMs without erythema, bulging. No erythema, swelling, or exudate on post pharynx.  Tonsils not swollen or erythematous. Hearing normal.  Neck: Supple, thyroid normal.  Respiratory: Respiratory effort normal, BS equal bilaterally without rales, rhonchi, wheezing or stridor.  Cardio: RRR with systolic click. Brisk peripheral pulses with 1+ edema.  Abdomen: Soft, + BS, obese  Non tender, no guarding, rebound, hernias, masses. Lymphatics: Non tender without lymphadenopathy.  Musculoskeletal: Full ROM, 5/5 strength, normal gait.  Skin: Warm, dry without rashes, lesions, ecchymosis.  Neuro: Cranial nerves intact. No cerebellar symptoms. Sensation intact.  Psych: Awake and oriented X 3, normal affect, Insight and Judgment appropriate.    Vicie Mutters, PA-C 2:34 PM [facility]

## 2018-08-11 ENCOUNTER — Ambulatory Visit (INDEPENDENT_AMBULATORY_CARE_PROVIDER_SITE_OTHER): Payer: PPO | Admitting: Physician Assistant

## 2018-08-11 ENCOUNTER — Encounter: Payer: Self-pay | Admitting: Physician Assistant

## 2018-08-11 VITALS — BP 140/70 | HR 66 | Temp 98.5°F | Ht 69.5 in | Wt 224.0 lb

## 2018-08-11 DIAGNOSIS — I2583 Coronary atherosclerosis due to lipid rich plaque: Secondary | ICD-10-CM | POA: Diagnosis not present

## 2018-08-11 DIAGNOSIS — Z79899 Other long term (current) drug therapy: Secondary | ICD-10-CM

## 2018-08-11 DIAGNOSIS — E1121 Type 2 diabetes mellitus with diabetic nephropathy: Secondary | ICD-10-CM | POA: Diagnosis not present

## 2018-08-11 DIAGNOSIS — I10 Essential (primary) hypertension: Secondary | ICD-10-CM | POA: Diagnosis not present

## 2018-08-11 DIAGNOSIS — E782 Mixed hyperlipidemia: Secondary | ICD-10-CM

## 2018-08-11 DIAGNOSIS — E559 Vitamin D deficiency, unspecified: Secondary | ICD-10-CM | POA: Diagnosis not present

## 2018-08-11 DIAGNOSIS — I2510 Atherosclerotic heart disease of native coronary artery without angina pectoris: Secondary | ICD-10-CM

## 2018-08-11 NOTE — Patient Instructions (Signed)
Your A1C is a measure of your sugar over the past 3 months and is not affected by what you have eaten over the past few days. Diabetes increases your chances of stroke and heart attack over 300 % and is the leading cause of blindness and kidney failure in the [place]. Please make sure you decrease bad carbs like white bread, white rice, potatoes, corn, soft drinks, pasta, cereals, refined sugars, sweet tea, dried fruits, and fruit juice. Good carbs are okay to eat in moderation like sweet potatoes, brown rice, whole grain pasta/bread, most fruit (except dried fruit) and you can eat as many veggies as you want.   Greater than 6.5 is considered diabetic. Between 6.4 and 5.7 is prediabetic If your A1C is less than 5.7 you are NOT diabetic.  Targets for Glucose Readings: Time of Check Target for patients WITHOUT Diabetes Target for DIABETICS  Before Meals Less than 100  less than 150  Two hours after meals Less than 200  Less than 250   What does your A1C results mean?  Your A1C is a measure of your sugar over the past 3 months   Use this chart as a guide to compare the results of your A1C blood test to your estimated average daily blood sugar:  A1C Range Average Sugar  4.0-6.0% 60-120 mg/dl  6.1-7.0% 121 - 150 mg/dl  7.1-8.0% 151-180 mg/dl  8.1-9.0% 181-210 mg/dl  10.1-11% 211-240 mg/dl  11.1-12.0% 271-300 mg/dl  12.1-13.0% 301-330 mg/dl  13.1-14.0% 331-360 mg/dl  Greater than 14.0% Greater than 360 mg/dl    Diet soda leads to weight gain.  We recently discovered that the artifical sugar in the soda stops an enzyme in your stomach that is suppose to signal that your brain is full. So patients that drink a lot of diet soda will never feel full and tend to over eat. So please cut back on diet soda and it can help with your weight loss.      When it comes to diets, agreement about the perfect plan isn't easy to find, even among the experts. Experts at the [employer] developed an idea known as the Healthy Eating Plate. Just imagine a plate divided into logical, healthy portions.  The emphasis is on diet quality:  Load up on vegetables and fruits - one-half of your plate: Aim for color and variety, and remember that potatoes don't count.  Go for whole grains - one-quarter of your plate: Whole wheat, barley, wheat berries, quinoa, oats, brown rice, and foods made with them. If you want pasta, go with whole wheat pasta.  Protein power - one-quarter of your plate: Fish, chicken, beans, and nuts are all healthy, versatile protein sources. Limit red meat.  The diet, however, does go beyond the plate, offering a few other suggestions.  Use healthy plant oils, such as olive, canola, soy, corn, sunflower and peanut. Check the labels, and avoid partially hydrogenated oil, which have unhealthy trans fats.  If you're thirsty, drink water. Coffee and tea are good in moderation, but skip sugary drinks and limit milk and dairy products to one or two daily servings.  The type of carbohydrate in the diet is more important than the amount. Some sources of carbohydrates, such as vegetables, fruits, whole grains, and beans-are healthier than others.  Finally, stay active.

## 2018-08-12 LAB — COMPLETE METABOLIC PANEL WITHOUT GFR
AG Ratio: 2.1 (calc) (ref 1.0–2.5)
ALT: 17 U/L (ref 9–46)
AST: 16 U/L (ref 10–35)
Albumin: 4 g/dL (ref 3.6–5.1)
Alkaline phosphatase (APISO): 76 U/L (ref 40–115)
BUN: 12 mg/dL (ref 7–25)
CO2: 22 mmol/L (ref 20–32)
Calcium: 9.1 mg/dL (ref 8.6–10.3)
Chloride: 106 mmol/L (ref 98–110)
Creat: 0.82 mg/dL (ref 0.70–1.18)
GFR, Est African American: 100 mL/min/{1.73_m2}
GFR, Est Non African American: 87 mL/min/{1.73_m2}
Globulin: 1.9 g/dL (ref 1.9–3.7)
Glucose, Bld: 146 mg/dL — ABNORMAL HIGH (ref 65–99)
Potassium: 4.4 mmol/L (ref 3.5–5.3)
Sodium: 140 mmol/L (ref 135–146)
Total Bilirubin: 0.6 mg/dL (ref 0.2–1.2)
Total Protein: 5.9 g/dL — ABNORMAL LOW (ref 6.1–8.1)

## 2018-08-12 LAB — CBC WITH DIFFERENTIAL/PLATELET
Basophils Absolute: 21 {cells}/uL (ref 0–200)
Basophils Relative: 0.4 %
Eosinophils Absolute: 130 {cells}/uL (ref 15–500)
Eosinophils Relative: 2.5 %
HCT: 41.5 % (ref 38.5–50.0)
Hemoglobin: 14.5 g/dL (ref 13.2–17.1)
Lymphs Abs: 1274 {cells}/uL (ref 850–3900)
MCH: 32.3 pg (ref 27.0–33.0)
MCHC: 34.9 g/dL (ref 32.0–36.0)
MCV: 92.4 fL (ref 80.0–100.0)
MPV: 11.5 fL (ref 7.5–12.5)
Monocytes Relative: 8.7 %
Neutro Abs: 3323 {cells}/uL (ref 1500–7800)
Neutrophils Relative %: 63.9 %
Platelets: 152 10*3/uL (ref 140–400)
RBC: 4.49 Million/uL (ref 4.20–5.80)
RDW: 13.2 % (ref 11.0–15.0)
Total Lymphocyte: 24.5 %
WBC mixed population: 452 {cells}/uL (ref 200–950)
WBC: 5.2 10*3/uL (ref 3.8–10.8)

## 2018-08-12 LAB — LIPID PANEL
Cholesterol: 135 mg/dL
HDL: 38 mg/dL — ABNORMAL LOW
LDL Cholesterol (Calc): 72 mg/dL
Non-HDL Cholesterol (Calc): 97 mg/dL
Total CHOL/HDL Ratio: 3.6 (calc)
Triglycerides: 169 mg/dL — ABNORMAL HIGH

## 2018-08-12 LAB — TSH: TSH: 1.25 m[IU]/L (ref 0.40–4.50)

## 2018-08-12 LAB — HEMOGLOBIN A1C
Hgb A1c MFr Bld: 7.7 %{Hb} — ABNORMAL HIGH
Mean Plasma Glucose: 174 (calc)
eAG (mmol/L): 9.7 (calc)

## 2018-08-12 LAB — VITAMIN D 25 HYDROXY (VIT D DEFICIENCY, FRACTURES): Vit D, 25-Hydroxy: 45 ng/mL (ref 30–100)

## 2018-08-17 ENCOUNTER — Ambulatory Visit (INDEPENDENT_AMBULATORY_CARE_PROVIDER_SITE_OTHER): Payer: PPO | Admitting: Pharmacist

## 2018-08-17 DIAGNOSIS — Z7901 Long term (current) use of anticoagulants: Secondary | ICD-10-CM | POA: Diagnosis not present

## 2018-08-17 DIAGNOSIS — Z952 Presence of prosthetic heart valve: Secondary | ICD-10-CM | POA: Diagnosis not present

## 2018-08-17 LAB — POCT INR: INR: 3.2 — AB (ref 2.0–3.0)

## 2018-08-24 LAB — CBC WITH DIFFERENTIAL/PLATELET
Basophils Absolute: 31 {cells}/uL (ref 0–200)
Basophils Relative: 0.5 %
Eosinophils Absolute: 87 {cells}/uL (ref 15–500)
Eosinophils Relative: 1.4 %
HCT: 46 % (ref 38.5–50.0)
Hemoglobin: 15.9 g/dL (ref 13.2–17.1)
Lymphs Abs: 1017 {cells}/uL (ref 850–3900)
MCH: 31.1 pg (ref 27.0–33.0)
MCHC: 34.6 g/dL (ref 32.0–36.0)
MCV: 89.8 fL (ref 80.0–100.0)
MPV: 11.6 fL (ref 7.5–12.5)
Monocytes Relative: 9.3 %
Neutro Abs: 4489 {cells}/uL (ref 1500–7800)
Neutrophils Relative %: 72.4 %
Platelets: 157 10*3/uL (ref 140–400)
RBC: 5.12 Million/uL (ref 4.20–5.80)
RDW: 13.5 % (ref 11.0–15.0)
Total Lymphocyte: 16.4 %
WBC mixed population: 577 {cells}/uL (ref 200–950)
WBC: 6.2 10*3/uL (ref 3.8–10.8)

## 2018-08-24 LAB — COMPLETE METABOLIC PANEL WITHOUT GFR
AG Ratio: 1.9 (calc) (ref 1.0–2.5)
ALT: 19 U/L (ref 9–46)
AST: 17 U/L (ref 10–35)
Albumin: 4.5 g/dL (ref 3.6–5.1)
Alkaline phosphatase (APISO): 87 U/L (ref 40–115)
BUN: 13 mg/dL (ref 7–25)
CO2: 27 mmol/L (ref 20–32)
Calcium: 9.5 mg/dL (ref 8.6–10.3)
Chloride: 103 mmol/L (ref 98–110)
Creat: 0.85 mg/dL (ref 0.70–1.18)
GFR, Est African American: 99 mL/min/{1.73_m2}
GFR, Est Non African American: 85 mL/min/{1.73_m2}
Globulin: 2.4 g/dL (ref 1.9–3.7)
Glucose, Bld: 247 mg/dL — ABNORMAL HIGH (ref 65–99)
Potassium: 4.6 mmol/L (ref 3.5–5.3)
Sodium: 139 mmol/L (ref 135–146)
Total Bilirubin: 0.9 mg/dL (ref 0.2–1.2)
Total Protein: 6.9 g/dL (ref 6.1–8.1)

## 2018-08-24 LAB — HEMOGLOBIN A1C
Hgb A1c MFr Bld: 8.2 %{Hb} — ABNORMAL HIGH
Mean Plasma Glucose: 189 (calc)
eAG (mmol/L): 10.4 (calc)

## 2018-08-24 LAB — LIPID PANEL
Cholesterol: 148 mg/dL
HDL: 39 mg/dL — ABNORMAL LOW
LDL Cholesterol (Calc): 90 mg/dL
Non-HDL Cholesterol (Calc): 109 mg/dL
Total CHOL/HDL Ratio: 3.8 (calc)
Triglycerides: 101 mg/dL

## 2018-08-24 LAB — TSH: TSH: 1.65 m[IU]/L (ref 0.40–4.50)

## 2018-08-24 LAB — VITAMIN D 25 HYDROXY (VIT D DEFICIENCY, FRACTURES): Vit D, 25-Hydroxy: 25 ng/mL — ABNORMAL LOW (ref 30–100)

## 2018-08-24 LAB — MAGNESIUM: Magnesium: 2.1 mg/dL (ref 1.5–2.5)

## 2018-08-24 LAB — VITAMIN B12: Vitamin B-12: 388 pg/mL (ref 200–1100)

## 2018-08-24 LAB — INSULIN, RANDOM: Insulin: 12.3 u[IU]/mL (ref 2.0–19.6)

## 2018-09-21 ENCOUNTER — Ambulatory Visit (INDEPENDENT_AMBULATORY_CARE_PROVIDER_SITE_OTHER): Payer: PPO | Admitting: Pharmacist Clinician (PhC)/ Clinical Pharmacy Specialist

## 2018-09-21 DIAGNOSIS — Z952 Presence of prosthetic heart valve: Secondary | ICD-10-CM

## 2018-09-21 DIAGNOSIS — Z7901 Long term (current) use of anticoagulants: Secondary | ICD-10-CM | POA: Diagnosis not present

## 2018-09-21 LAB — POCT INR: INR: 3.1 — AB (ref 2.0–3.0)

## 2018-09-22 ENCOUNTER — Other Ambulatory Visit: Payer: Self-pay | Admitting: Internal Medicine

## 2018-09-28 ENCOUNTER — Other Ambulatory Visit: Payer: Self-pay | Admitting: Adult Health

## 2018-09-29 ENCOUNTER — Other Ambulatory Visit: Payer: Self-pay | Admitting: Internal Medicine

## 2018-10-20 ENCOUNTER — Other Ambulatory Visit: Payer: Self-pay | Admitting: Internal Medicine

## 2018-10-20 ENCOUNTER — Ambulatory Visit (INDEPENDENT_AMBULATORY_CARE_PROVIDER_SITE_OTHER): Payer: PPO | Admitting: Pharmacist

## 2018-10-20 DIAGNOSIS — Z7901 Long term (current) use of anticoagulants: Secondary | ICD-10-CM

## 2018-10-20 DIAGNOSIS — Z952 Presence of prosthetic heart valve: Secondary | ICD-10-CM | POA: Diagnosis not present

## 2018-10-20 LAB — POCT INR: INR: 3.1 — AB (ref 2.0–3.0)

## 2018-10-20 NOTE — Patient Instructions (Signed)
Description   Continue with 1 tablet daily except 1/2 tablet each Sunday, Tuesday and Thursday.  Repeat INR in 4 weeks (last Tuesday of the month)

## 2018-11-01 DIAGNOSIS — E119 Type 2 diabetes mellitus without complications: Secondary | ICD-10-CM | POA: Diagnosis not present

## 2018-11-01 DIAGNOSIS — H2513 Age-related nuclear cataract, bilateral: Secondary | ICD-10-CM | POA: Diagnosis not present

## 2018-11-09 NOTE — Progress Notes (Signed)
[facility]   Billy Reilly, M.D.     Billy Reilly. Billy Reilly, P.A.-C Billy Reilly, Billy Reilly                [address] [place] [address] Telephone [phone] Telefax [phone] Annual  Screening/Preventative Visit  & Comprehensive Evaluation & Examination     This very nice 77 y.o. MWM presents for a Screening /Preventative Visit & comprehensive evaluation and management of multiple medical co-morbidities.  Patient has been followed for HTN, HLD, T2_NIDDM  and Vitamin D Deficiency.     HTN predates circa 1994. Patient's BP has been controlled at home.  Today's BP was initially sl elevated & rechecked at goal - 138/78. In 2004, he had CABG & St Jude mechanical AoVR and has been on [facility]in since [facility]. In 2016, he had a negative Myoview per Dr Debara Pickett. Patient denies any cardiac symptoms as chest pain, palpitations, shortness of breath, dizziness or ankle swelling.     Patient's hyperlipidemia is controlled with diet and medications. Patient denies myalgias or other medication SE's. 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 He's on treatment with Metformin, Glipizide & Wilder Glade. Last A1c was not at goal: Lab Results  Component Value Date   HGBA1C 8.4 (H) 11/10/2018       Finally, patient has history of Vitamin D Deficiency ("28" / 2012) and last vitamin D was low (goal 70-100): Lab Results  Component Value Date   VD25OH 36 11/10/2018   Current Outpatient Medications on File Prior to Visit  Medication Sig  . aspirin 81 MG tablet Take 81 mg  by mouth daily.    Marland Kitchen CINNAMON PO Take 1,000 mg by mouth 4 (four) times daily.  . clotrimazole-betamethasone (LOTRISONE) cream APPLY TO AFFECTED AREA TWICE A DAY FOR 2 WEEKS  . cyanocobalamin (,VITAMIN B-12,) 1000 MCG/ML injection INJECT 1/2 ML EVERY MONTH  . enalapril (VASOTEC) 20 MG tablet TAKE 2 TABLETS BY MOUTH EVERY DAY  . glipiZIDE (GLUCOTROL) 10 MG tablet TAKE 1 TABLET BY MOUTH 3 TIMES A DAY BEFORE MEALS  . glucose blood (ONE TOUCH ULTRA TEST) test strip CHECK BLOOD SUGAR ONCE DAILY  . metFORMIN (GLUCOPHAGE-XR) 500 MG 24 hr tablet TAKE 2 TABLETS BY MOUTH TWICE A DAY WITH A MEAL  . metoprolol succinate (TOPROL-XL) 50 MG 24 hr tablet Take 1 tablet (50 mg total) by mouth daily. Take with or immediately following a meal.  . simvastatin (ZOCOR) 40 MG tablet Take 1 tablet (40 mg total) by mouth daily.  Marland Kitchen terbinafine (LAMISIL) 250 MG tablet TAKE 1 TABLET BY MOUTH DAILY FOR 1 MONTH ON, 1 MONTH OFF UNTIL RUN OUT  . warfarin (COUMADIN) 5 MG tablet TAKE 1/2 TO 1 TABLET BY MOUTH DAILY OR AS DIRECTED   No current facility-administered medications on file prior to visit.    No Known Allergies   Past Medical History:  Diagnosis Date  . Anemia   . AS (aortic stenosis)    valve replacement-st. jude  mechanical valve  . ASHD (arteriosclerotic heart disease)   . CAD (coronary artery disease), native coronary artery    CABG 07/18/2003  . Cancer [facility])    prostate  . GERD (gastroesophageal reflux disease)   . History of nuclear stress test 02/12/10   EF 67%, normal perfusion  . Hyperlipidemia   . Hypertension   . Morbid obesity ([place])   . Type II or unspecified type diabetes mellitus without mention of complication, not stated as uncontrolled   . Umbilical hernia   . Vitamin B12 deficiency   . Vitamin D deficiency    Health Maintenance  Topic Date Due  . OPHTHALMOLOGY EXAM  11/11/2018  . TETANUS/TDAP  02/05/2019 (Originally 11/24/2017)  . HEMOGLOBIN A1C  05/12/2019  . FOOT EXAM  11/11/2019  .  INFLUENZA VACCINE  Completed  . PNA vac Low Risk Adult  Completed   Immunization History  Administered Date(s) Administered  . Influenza Whole 09/01/2012  . Influenza, High Dose Seasonal PF 09/27/2015  . Influenza-Unspecified 08/25/2016, 08/24/2018  . Pneumococcal Conjugate-13 12/18/2015  . Pneumococcal Polysaccharide-23 09/01/2012  . Tdap 11/25/2007   Cologard 10/08/2015 - Negative - needs 3 yr f/u - due Nov/2019  Past Surgical History:  Procedure Laterality Date  . AORTIC VALVE REPLACEMENT  07/18/2003   56mm ATS-AP valve  . AORTIC VALVE REPLACEMENT (AVR)/CORONARY ARTERY BYPASS GRAFTING (CABG)  07/18/2003   AVR; bypass x 2-SVG to first diag, SVG to posterior descending  . CARDIAC CATHETERIZATION  07/13/2003   AS-aortic valve gradient 3mmhg, valve area 1.1; obstruction of ostium of posterior lateral branch with obstruction in the ostium of first diagnonal  . HERNIA REPAIR  09/09/5101   RIH, umbilical  . NM MYOCAR PERF WALL MOTION  02/12/2010   protocol:[place], EF 67%, exercise cap 7 METS, low risk scan  . TEE WITHOUT CARDIOVERSION  07/18/2003   no evidence of PE, Aortic valve heavily calcified,   . TRANSTHORACIC ECHOCARDIOGRAM  02/21/2013   EF60-65%, Septal motion showed paradox. atrium moderately dilated   Family History  Problem Relation Age of Onset  . Stroke Mother   . Heart attack Father   . Heart disease Father   . Cancer Brother   . Cancer Sister    Social History   Socioeconomic History  . Marital status: Married    Spouse name: Dianna  . Number of children: Not on file  Occupational History  . Commercial truck driver   Tobacco Use  . Smoking status: Never Smoker  . Smokeless tobacco: Never Used  Substance and Sexual Activity  . Alcohol use: No  . Drug use: No  . Sexual activity: Not on file    ROS Constitutional: Denies fever, chills, weight loss/gain, headaches, insomnia,  night sweats or change in appetite. Does c/o fatigue. Eyes: Denies redness,  blurred vision, diplopia, discharge, itchy or watery eyes.  ENT: Denies discharge, congestion, post nasal drip, epistaxis, sore throat, earache, hearing loss, dental pain, Tinnitus, Vertigo, Sinus pain or snoring.  Cardio: Denies chest pain, palpitations, irregular heartbeat, syncope, dyspnea, diaphoresis, orthopnea, PND, claudication or edema Respiratory: denies cough, dyspnea, DOE, pleurisy, hoarseness, laryngitis or wheezing.  Gastrointestinal: Denies dysphagia, heartburn, reflux, water brash, pain, cramps, nausea, vomiting, bloating, diarrhea, constipation, hematemesis, melena, hematochezia, jaundice or hemorrhoids Genitourinary: Denies dysuria, frequency, discharge, hematuria or flank pain. Has urgency, nocturia x 2-3 & occasional hesitancy. Musculoskeletal: Denies arthralgia, myalgia, stiffness, Jt. Swelling, pain, limp or strain/sprain. Denies Falls. Skin: Denies puritis, rash, hives, warts, acne, eczema or change in skin lesion  Neuro: No weakness, tremor, incoordination, spasms, paresthesia or pain Psychiatric: Denies confusion, memory loss or sensory loss. Denies Depression. Endocrine: Denies change in weight, skin, hair change, nocturia, and paresthesia, diabetic polys, visual blurring or hyper / hypo glycemic episodes.  Heme/Lymph: No excessive bleeding, bruising or enlarged lymph nodes.  Physical Exam  BP 138/78   Pulse 60   Temp (!) 97.3 F (36.3 C)   Resp 18   Ht 5\' 9"  (1.753 m)   Wt 225 lb 3.2 oz (102.2 kg)   BMI 33.26 kg/m   General Appearance: Over nourished and well groomed and in no apparent distress.  Eyes: PERRLA, EOMs, conjunctiva no swelling or erythema, normal fundi and vessels. Sinuses: No frontal/maxillary tenderness ENT/Mouth: EACs patent / TMs  nl. Nares clear without erythema, swelling, mucoid exudates. Oral hygiene is good. No erythema, swelling, or exudate. Tongue normal, non-obstructing. Tonsils not swollen or erythematous. Hearing normal.  Neck: Supple,  thyroid not palpable. No bruits, nodes or JVD. Respiratory: Respiratory effort normal.  BS equal and clear bilateral without rales, rhonci, wheezing or stridor. Cardio: Heart sounds are normal with regular rate and rhythm and no murmurs, rubs or gallops. Peripheral pulses are normal and equal bilaterally without edema. No aortic or femoral bruits. Chest: symmetric with normal excursions and percussion.  Abdomen: Soft, with Nl bowel sounds. Nontender, no guarding, rebound, hernias, masses, or organomegaly.  Lymphatics: Non tender without lymphadenopathy.  Musculoskeletal: Full ROM all peripheral extremities, joint stability, 5/5 strength, and normal gait. Skin: Warm and dry without rashes, lesions, cyanosis, clubbing or  ecchymosis.  Neuro: Cranial nerves intact, reflexes equal bilaterally. Normal muscle tone, no cerebellar symptoms. Sensation intact to touch, vibratory and Monofilament to the toes bilaterally. Pysch: Alert and oriented X 3 with normal affect, insight and judgment appropriate.   Assessment and Plan  1. Annual Preventative/Screening Exam   2. Essential hypertension  - EKG 12-Lead - [place], RETROPERITNL ABD,  LTD - Urinalysis, Routine w reflex microscopic - Microalbumin / creatinine urine ratio - CBC with Differential/Platelet - COMPLETE METABOLIC PANEL WITH GFR - Magnesium - TSH  3. Hyperlipidemia, mixed  - EKG 12-Lead - [place], RETROPERITNL ABD,  LTD - Lipid panel - TSH  4. Type 2 diabetes mellitus with stage 2 chronic kidney disease, without long-term current use of insulin (HCC)  - EKG 12-Lead - [place], RETROPERITNL ABD,  LTD - Urinalysis, Routine w reflex microscopic - Microalbumin / creatinine urine ratio - HM DIABETES FOOT EXAM - LOW EXTREMITY NEUR EXAM DOCUM - Hemoglobin A1c - Insulin, random  5. Vitamin D deficiency  - VITAMIN D 25 Hydroxyl  6. Coronary artery disease due to lipid rich plaque  - EKG 12-Lead - Lipid panel  7. H/O mechanical aortic valve  replacement  - EKG 12-Lead  8. Screening for colorectal cancer  - POC Hemoccult Bld/Stl  9. 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 Prostate cancer screening  - PSA  11. Screening for ischemic heart disease  - EKG 12-Lead - Lipid panel  12. FHx: heart disease  - EKG 12-Lead - [place], RETROPERITNL ABD,  LTD  13. Screening for AAA (aortic abdominal aneurysm)  - [place], RETROPERITNL ABD,  LTD  14. Medication management  - Urinalysis, Routine w reflex microscopic - Microalbumin / creatinine urine ratio - CBC with Differential/Platelet - COMPLETE METABOLIC PANEL WITH GFR - Magnesium - Lipid panel - TSH - Hemoglobin A1c - Insulin, random - VITAMIN D 25 Hydroxyl  Patient was counseled in prudent diet, weight control to achieve/maintain BMI less than 25, BP monitoring, regular exercise and medications as discussed.  Discussed med effects and SE's. Routine screening labs and tests as requested with regular follow-up as recommended. Over 40 minutes of exam, counseling, chart review and high complex critical decision making was performed

## 2018-11-10 ENCOUNTER — Encounter: Payer: Self-pay | Admitting: Internal Medicine

## 2018-11-10 ENCOUNTER — Ambulatory Visit (INDEPENDENT_AMBULATORY_CARE_PROVIDER_SITE_OTHER): Payer: PPO | Admitting: Internal Medicine

## 2018-11-10 VITALS — BP 138/78 | HR 60 | Temp 97.3°F | Resp 18 | Ht 69.0 in | Wt 225.2 lb

## 2018-11-10 DIAGNOSIS — Z136 Encounter for screening for cardiovascular disorders: Secondary | ICD-10-CM | POA: Diagnosis not present

## 2018-11-10 DIAGNOSIS — Z125 Encounter for screening for malignant neoplasm of prostate: Secondary | ICD-10-CM

## 2018-11-10 DIAGNOSIS — I2583 Coronary atherosclerosis due to lipid rich plaque: Secondary | ICD-10-CM

## 2018-11-10 DIAGNOSIS — Z951 Presence of aortocoronary bypass graft: Secondary | ICD-10-CM

## 2018-11-10 DIAGNOSIS — E782 Mixed hyperlipidemia: Secondary | ICD-10-CM

## 2018-11-10 DIAGNOSIS — Z0000 Encounter for general adult medical examination without abnormal findings: Secondary | ICD-10-CM

## 2018-11-10 DIAGNOSIS — E1122 Type 2 diabetes mellitus with diabetic chronic kidney disease: Secondary | ICD-10-CM

## 2018-11-10 DIAGNOSIS — I10 Essential (primary) hypertension: Secondary | ICD-10-CM | POA: Diagnosis not present

## 2018-11-10 DIAGNOSIS — Z1211 Encounter for screening for malignant neoplasm of colon: Secondary | ICD-10-CM

## 2018-11-10 DIAGNOSIS — Z952 Presence of prosthetic heart valve: Secondary | ICD-10-CM

## 2018-11-10 DIAGNOSIS — E559 Vitamin D deficiency, unspecified: Secondary | ICD-10-CM

## 2018-11-10 DIAGNOSIS — K219 Gastro-esophageal reflux disease without esophagitis: Secondary | ICD-10-CM

## 2018-11-10 DIAGNOSIS — Z0001 Encounter for general adult medical examination with abnormal findings: Secondary | ICD-10-CM

## 2018-11-10 DIAGNOSIS — Z1212 Encounter for screening for malignant neoplasm of rectum: Secondary | ICD-10-CM

## 2018-11-10 DIAGNOSIS — Z8249 Family history of ischemic heart disease and other diseases of the circulatory system: Secondary | ICD-10-CM

## 2018-11-10 DIAGNOSIS — I2510 Atherosclerotic heart disease of native coronary artery without angina pectoris: Secondary | ICD-10-CM

## 2018-11-10 DIAGNOSIS — Z79899 Other long term (current) drug therapy: Secondary | ICD-10-CM

## 2018-11-10 DIAGNOSIS — N419 Inflammatory disease of prostate, unspecified: Secondary | ICD-10-CM

## 2018-11-10 DIAGNOSIS — N401 Enlarged prostate with lower urinary tract symptoms: Secondary | ICD-10-CM

## 2018-11-10 DIAGNOSIS — N182 Chronic kidney disease, stage 2 (mild): Secondary | ICD-10-CM

## 2018-11-10 MED ORDER — FAMOTIDINE 20 MG PO TABS: ORAL_TABLET | ORAL | 3 refills | Status: DC

## 2018-11-10 NOTE — Patient Instructions (Signed)

## 2018-11-11 ENCOUNTER — Other Ambulatory Visit: Payer: Self-pay | Admitting: Internal Medicine

## 2018-11-11 DIAGNOSIS — N419 Inflammatory disease of prostate, unspecified: Secondary | ICD-10-CM

## 2018-11-11 LAB — CBC WITH DIFFERENTIAL/PLATELET
Absolute Monocytes: 426 {cells}/uL (ref 200–950)
Basophils Absolute: 21 {cells}/uL (ref 0–200)
Basophils Relative: 0.4 %
Eosinophils Absolute: 130 {cells}/uL (ref 15–500)
Eosinophils Relative: 2.5 %
HCT: 42.4 % (ref 38.5–50.0)
Hemoglobin: 14.3 g/dL (ref 13.2–17.1)
Lymphs Abs: 1056 {cells}/uL (ref 850–3900)
MCH: 31.6 pg (ref 27.0–33.0)
MCHC: 33.7 g/dL (ref 32.0–36.0)
MCV: 93.6 fL (ref 80.0–100.0)
MPV: 12.1 fL (ref 7.5–12.5)
Monocytes Relative: 8.2 %
Neutro Abs: 3567 {cells}/uL (ref 1500–7800)
Neutrophils Relative %: 68.6 %
Platelets: 161 10*3/uL (ref 140–400)
RBC: 4.53 Million/uL (ref 4.20–5.80)
RDW: 12.6 % (ref 11.0–15.0)
Total Lymphocyte: 20.3 %
WBC: 5.2 10*3/uL (ref 3.8–10.8)

## 2018-11-11 LAB — COMPLETE METABOLIC PANEL WITHOUT GFR
AG Ratio: 1.8 (calc) (ref 1.0–2.5)
ALT: 18 U/L (ref 9–46)
AST: 17 U/L (ref 10–35)
Albumin: 3.9 g/dL (ref 3.6–5.1)
Alkaline phosphatase (APISO): 82 U/L (ref 40–115)
BUN/Creatinine Ratio: 15 (calc) (ref 6–22)
BUN: 10 mg/dL (ref 7–25)
CO2: 26 mmol/L (ref 20–32)
Calcium: 9.2 mg/dL (ref 8.6–10.3)
Chloride: 105 mmol/L (ref 98–110)
Creat: 0.67 mg/dL — ABNORMAL LOW (ref 0.70–1.18)
GFR, Est African American: 108 mL/min/{1.73_m2}
GFR, Est Non African American: 93 mL/min/{1.73_m2}
Globulin: 2.2 g/dL (ref 1.9–3.7)
Glucose, Bld: 161 mg/dL — ABNORMAL HIGH (ref 65–99)
Potassium: 4.3 mmol/L (ref 3.5–5.3)
Sodium: 138 mmol/L (ref 135–146)
Total Bilirubin: 0.7 mg/dL (ref 0.2–1.2)
Total Protein: 6.1 g/dL (ref 6.1–8.1)

## 2018-11-11 LAB — VITAMIN D 25 HYDROXY (VIT D DEFICIENCY, FRACTURES): Vit D, 25-Hydroxy: 36 ng/mL (ref 30–100)

## 2018-11-11 LAB — URINALYSIS, ROUTINE W REFLEX MICROSCOPIC
Bacteria, UA: NONE SEEN /HPF
Bilirubin Urine: NEGATIVE
Hyaline Cast: NONE SEEN /LPF
Ketones, ur: NEGATIVE
Nitrite: NEGATIVE
Specific Gravity, Urine: 1.027 (ref 1.001–1.03)
Squamous Epithelial / HPF: NONE SEEN /HPF
WBC, UA: >=60 /HPF — AB (ref 0–5)
pH: <=5 (ref 5.0–8.0)

## 2018-11-11 LAB — LIPID PANEL
Cholesterol: 146 mg/dL
HDL: 40 mg/dL — ABNORMAL LOW
LDL Cholesterol (Calc): 89 mg/dL
Non-HDL Cholesterol (Calc): 106 mg/dL
Total CHOL/HDL Ratio: 3.7 (calc)
Triglycerides: 81 mg/dL

## 2018-11-11 LAB — HEMOGLOBIN A1C
Hgb A1c MFr Bld: 8.4 %{Hb} — ABNORMAL HIGH
Mean Plasma Glucose: 194 (calc)
eAG (mmol/L): 10.8 (calc)

## 2018-11-11 LAB — PSA: PSA: 0.2 ng/mL

## 2018-11-11 LAB — TSH: TSH: 1.59 m[IU]/L (ref 0.40–4.50)

## 2018-11-11 LAB — MICROALBUMIN / CREATININE URINE RATIO
Creatinine, Urine: 164 mg/dL (ref 20–320)
Microalb Creat Ratio: 37 ug/mg{creat} — ABNORMAL HIGH
Microalb, Ur: 6 mg/dL

## 2018-11-11 LAB — INSULIN, RANDOM: Insulin: 27.2 u[IU]/mL — ABNORMAL HIGH (ref 2.0–19.6)

## 2018-11-11 LAB — MAGNESIUM: Magnesium: 1.9 mg/dL (ref 1.5–2.5)

## 2018-11-12 DIAGNOSIS — N419 Inflammatory disease of prostate, unspecified: Secondary | ICD-10-CM | POA: Diagnosis not present

## 2018-11-14 LAB — URINE CULTURE
MICRO NUMBER:: 91527250
SPECIMEN QUALITY:: ADEQUATE

## 2018-11-15 ENCOUNTER — Other Ambulatory Visit: Payer: Self-pay | Admitting: Internal Medicine

## 2018-11-15 DIAGNOSIS — N410 Acute prostatitis: Secondary | ICD-10-CM

## 2018-11-15 MED ORDER — SULFAMETHOXAZOLE-TRIMETHOPRIM 800-160 MG PO TABS: ORAL_TABLET | ORAL | 0 refills | Status: DC

## 2018-11-18 ENCOUNTER — Ambulatory Visit (INDEPENDENT_AMBULATORY_CARE_PROVIDER_SITE_OTHER): Payer: PPO | Admitting: Pharmacist Clinician (PhC)/ Clinical Pharmacy Specialist

## 2018-11-18 DIAGNOSIS — Z7901 Long term (current) use of anticoagulants: Secondary | ICD-10-CM

## 2018-11-18 DIAGNOSIS — Z952 Presence of prosthetic heart valve: Secondary | ICD-10-CM | POA: Diagnosis not present

## 2018-11-18 LAB — POCT INR: INR: 2.8 (ref 2.0–3.0)

## 2018-11-23 ENCOUNTER — Ambulatory Visit (INDEPENDENT_AMBULATORY_CARE_PROVIDER_SITE_OTHER): Payer: PPO | Admitting: Pharmacist Clinician (PhC)/ Clinical Pharmacy Specialist

## 2018-11-23 DIAGNOSIS — Z7901 Long term (current) use of anticoagulants: Secondary | ICD-10-CM | POA: Diagnosis not present

## 2018-11-23 DIAGNOSIS — Z952 Presence of prosthetic heart valve: Secondary | ICD-10-CM

## 2018-11-23 LAB — POCT INR: INR: 2.5 (ref 2.0–3.0)

## 2018-11-23 NOTE — Patient Instructions (Signed)
Description   Continue with 1 tablet daily except 1/2 tablet each Sunday, Tuesday and Thursday.  Repeat INR in 6 weeks - call if you continue the antibiotic beyond 3 weeks or are given a different one.  Kristin/Raquel at [phone].

## 2018-11-25 DIAGNOSIS — Z1212 Encounter for screening for malignant neoplasm of rectum: Secondary | ICD-10-CM | POA: Diagnosis not present

## 2018-11-25 DIAGNOSIS — Z1211 Encounter for screening for malignant neoplasm of colon: Secondary | ICD-10-CM | POA: Diagnosis not present

## 2018-11-30 LAB — COLOGUARD: Cologuard: NEGATIVE

## 2018-12-01 ENCOUNTER — Encounter: Payer: Self-pay | Admitting: *Deleted

## 2018-12-05 ENCOUNTER — Other Ambulatory Visit: Payer: Self-pay | Admitting: Internal Medicine

## 2018-12-16 ENCOUNTER — Ambulatory Visit (INDEPENDENT_AMBULATORY_CARE_PROVIDER_SITE_OTHER): Payer: PPO

## 2018-12-16 ENCOUNTER — Other Ambulatory Visit: Payer: Self-pay | Admitting: Internal Medicine

## 2018-12-16 DIAGNOSIS — N410 Acute prostatitis: Secondary | ICD-10-CM

## 2018-12-16 NOTE — Progress Notes (Signed)
Reports for LAB----URINE CULTURE & U/A Reports meds  HAS FINISHED ABX                            at this time. Reports concerns   NO CONCERNS                     at this time.  Vitals taken at intake & entered.

## 2018-12-17 LAB — URINALYSIS, ROUTINE W REFLEX MICROSCOPIC
Bilirubin Urine: NEGATIVE
Hgb urine dipstick: NEGATIVE
Ketones, ur: NEGATIVE
Leukocytes, UA: NEGATIVE
Nitrite: NEGATIVE
Protein, ur: NEGATIVE
Specific Gravity, Urine: 1.039 — ABNORMAL HIGH (ref 1.001–1.03)
pH: <=5 (ref 5.0–8.0)

## 2018-12-17 LAB — URINE CULTURE
MICRO NUMBER:: 97356
SPECIMEN QUALITY:: ADEQUATE

## 2019-01-05 ENCOUNTER — Other Ambulatory Visit: Payer: Self-pay | Admitting: Internal Medicine

## 2019-01-10 ENCOUNTER — Ambulatory Visit: Payer: PPO | Admitting: Internal Medicine

## 2019-01-10 ENCOUNTER — Encounter: Payer: Self-pay | Admitting: Internal Medicine

## 2019-01-10 ENCOUNTER — Ambulatory Visit (INDEPENDENT_AMBULATORY_CARE_PROVIDER_SITE_OTHER): Payer: PPO | Admitting: Pharmacist Clinician (PhC)/ Clinical Pharmacy Specialist

## 2019-01-10 ENCOUNTER — Telehealth: Payer: Self-pay

## 2019-01-10 VITALS — BP 140/70 | HR 57 | Ht 69.5 in | Wt 223.0 lb

## 2019-01-10 DIAGNOSIS — Z7901 Long term (current) use of anticoagulants: Secondary | ICD-10-CM

## 2019-01-10 DIAGNOSIS — Z951 Presence of aortocoronary bypass graft: Secondary | ICD-10-CM

## 2019-01-10 DIAGNOSIS — Z952 Presence of prosthetic heart valve: Secondary | ICD-10-CM

## 2019-01-10 DIAGNOSIS — Z0289 Encounter for other administrative examinations: Secondary | ICD-10-CM | POA: Diagnosis not present

## 2019-01-10 DIAGNOSIS — I2583 Coronary atherosclerosis due to lipid rich plaque: Secondary | ICD-10-CM

## 2019-01-10 DIAGNOSIS — I10 Essential (primary) hypertension: Secondary | ICD-10-CM

## 2019-01-10 DIAGNOSIS — I2510 Atherosclerotic heart disease of native coronary artery without angina pectoris: Secondary | ICD-10-CM

## 2019-01-10 LAB — POCT INR: INR: 2.1 (ref 2.0–3.0)

## 2019-01-10 NOTE — Progress Notes (Signed)
OFFICE NOTE  Chief Complaint:  Feels well  Primary Care Physician: Unk Pinto, MD  HPI:  Billy Reilly is a 78 year old gentleman, previously followed by Dr. Rex Kras, with a history of coronary disease. In 2004, he had CABG and replacement of his aortic valve with a mechanical prosthesis. He had 2-vessel bypass at the time with saphenous vein graft to the first diagonal and saphenous vein graft to the posterior descending by Dr. Prescott Gum, and replacement of the aortic valve with a 22 mm ATS AP valve. He has done well with valve replacement to this point without any recurrent angina, shortness of breath, palpitations, presyncope, or syncopal symptoms. INR checked today in the office is 2.6 and it has been stable on Coumadin without dose adjustments for at least a year. 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 Recently he was started on Farxiga, which is resulted in excellent weight loss as well as control of his blood sugars.  Overall he feels quite well.  I saw Billy Reilly back today in the office. He is here for DOT visit. When I last saw him he was doing well denies any chest pain or shortness of breath. That holds true today. His warfarin levels have been well controlled his INR was 3.1 today. He has not had stress testing in over 5 years and his bypass was in 2004. 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  Billy Reilly returns today for follow-up. I last saw him 6 months ago for DOT physical. At that time we performed a nuclear stress test which was negative for ischemia. 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 He denies any chest pain or worsening shortness of breath. Blood pressures been well controlled.  I saw Billy Reilly back today in the office for follow-up. He returns for a DOT letter. He continues to do well after bypass surgery mechanical  valve replacement. His INRs have been therapeutic. He is completely asymptomatic. 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 He had a stress test last year which was negative for ischemia. He is due for repeat echocardiogram to reassess his valve gradients this year.  01/20/2017  Billy Reilly was seen today in follow-up. He is due for renewal of his DOT physical. From a cardiac standpoint he seems stable. Denies a chest pain or worsening shortness of breath. An echocardiogram last year which showed a stable aortic valve gradient. 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 He is on warfarin and INR today was 2.8. EKG shows sinus rhythm.  12/23/2017  Billy Reilly was seen today for routine follow-up.  He continues to work and requires a renewal of his DOT letter.  He has an upcoming echo at the end of February to assess the stability of his valve.  He denies any chest pain worsening shortness of breath.  His INR was a slightly low he needs some adjustment.  EKG is unchanged.  01/10/2019  Billy Reilly returns today for follow-up.  Overall he continues to do well.  He still working for [employer].  He drives oil trucks.  He continues to get annual DOT physicals.  He has an upcoming physical.  His echo last year showed a stable LVEF with normally functioning mechanical aortic valve.  His INR was  therapeutic today on warfarin.  He denies any worsening shortness of breath.  He has had no further chest pain.  He is now more than 15 years out from his bypass surgery.  I did review lab work from December 2019 which showed total cholesterol 146, HDL 40, LDL 89 and triglycerides 81.  His hemoglobin A1c was 8.4.  We discussed diet as well does sound like he has somewhat of increased saturated fats in his diet which could be further optimized.  PMHx:  Past Medical History:  Diagnosis Date  . Anemia   . AS (aortic stenosis)    valve replacement-[facility] [facility]  mechanical valve  . ASHD (arteriosclerotic heart disease)   . CAD (coronary artery disease), native coronary artery    CABG 07/18/2003  . Cancer [facility])    prostate  . GERD (gastroesophageal reflux disease)   . History of nuclear stress test 02/12/10   EF 67%, normal perfusion  . Hyperlipidemia   . Hypertension   . Morbid obesity ([place])   . Type II or unspecified type diabetes mellitus without mention of complication, not stated as uncontrolled   . Umbilical hernia   . Vitamin B12 deficiency   . Vitamin D deficiency     Past Surgical History:  Procedure Laterality Date  . AORTIC VALVE REPLACEMENT  07/18/2003   5mm ATS-AP valve  . AORTIC VALVE REPLACEMENT (AVR)/CORONARY ARTERY BYPASS GRAFTING (CABG)  07/18/2003   AVR; bypass x 2-SVG to first diag, SVG to posterior descending  . CARDIAC CATHETERIZATION  07/13/2003   AS-aortic valve gradient 66mmhg, valve area 1.1; obstruction of ostium of posterior lateral branch with obstruction in the ostium of first diagnonal  . HERNIA REPAIR  08/67/6195   RIH, umbilical  . NM MYOCAR PERF WALL MOTION  02/12/2010   protocol:Bruce, EF 67%, exercise cap 7 METS, low risk scan  . TEE WITHOUT CARDIOVERSION  07/18/2003   no evidence of PE, Aortic valve heavily calcified,   . TRANSTHORACIC ECHOCARDIOGRAM  02/21/2013   EF60-65%, Septal motion showed paradox. atrium moderately dilated    FAMHx:  Family History  Problem Relation Age of Onset  . Stroke Mother   . Heart attack Father   . Heart disease Father   . Cancer Brother   . Cancer Sister     SOCHx:   reports that he has never smoked. He has never used smokeless tobacco. He reports that he does not drink alcohol or use drugs.  ALLERGIES:  No Known Allergies  ROS: Pertinent items noted in HPI and remainder of comprehensive ROS otherwise negative.  HOME MEDS: Current Outpatient Medications  Medication Sig Dispense Refill  . aspirin 81 MG tablet Take 81 mg by mouth daily.      Marland Kitchen CINNAMON  PO Take 1,000 mg by mouth 4 (four) times daily.    . clotrimazole-betamethasone (LOTRISONE) cream APPLY TO AFFECTED AREA TWICE A DAY FOR 2 WEEKS 45 g 3  . cyanocobalamin (,VITAMIN B-12,) 1000 MCG/ML injection INJECT 1/2 ML EVERY MONTH 10 mL 2  . enalapril (VASOTEC) 20 MG tablet TAKE 2 TABLETS BY MOUTH EVERY DAY 180 tablet 0  . famotidine (PEPCID) 20 MG tablet Take 1 tablet 2 x /day with meals for Acid Indigestion 180 tablet 3  . glipiZIDE (GLUCOTROL) 10 MG tablet TAKE 1 TABLET BY MOUTH 3 TIMES A DAY BEFORE MEALS 270 tablet 3  . glucose blood (ONE TOUCH ULTRA TEST) test strip CHECK BLOOD SUGAR ONCE DAILY 100 each 1  . metFORMIN (GLUCOPHAGE-XR) 500  MG 24 hr tablet TAKE 2 TABLETS BY MOUTH TWICE A DAY WITH A MEAL 360 tablet 1  . metoprolol succinate (TOPROL-XL) 50 MG 24 hr tablet TAKE 1 TABLET (50 MG TOTAL) BY MOUTH DAILY. TAKE WITH OR IMMEDIATELY FOLLOWING A MEAL. 90 tablet 3  . simvastatin (ZOCOR) 40 MG tablet TAKE 1 TABLET BY MOUTH EVERY DAY 90 tablet 3  . sulfamethoxazole-trimethoprim (BACTRIM DS,SEPTRA DS) 800-160 MG tablet Take 1 tablet 2 x /day with a meal - 3 weeks - Prostate infection 42 tablet 0  . terbinafine (LAMISIL) 250 MG tablet TAKE 1 TABLET BY MOUTH DAILY FOR 1 MONTH ON, 1 MONTH OFF UNTIL RUN OUT 90 tablet 0  . warfarin (COUMADIN) 5 MG tablet TAKE 1/2 TO 1 TABLET BY MOUTH DAILY OR AS DIRECTED 90 tablet 1   No current facility-administered medications for this visit.     LABS/IMAGING: No results found for this or any previous visit (from the past 48 hour(s)). No results found.  VITALS: BP 140/70 (BP Location: Left Arm, Patient Position: Sitting, Cuff Size: Normal)   Pulse (!) 57   Ht 5' 9.5" (1.765 m)   Wt 223 lb (101.2 kg)   BMI 32.46 kg/m   EXAM: General appearance: alert and no distress Neck: no carotid bruit, no JVD and thyroid not enlarged, symmetric, no tenderness/mass/nodules Lungs: clear to auscultation bilaterally Heart: regular rate and rhythm, S1, S2 normal, no  murmur, click, rub or gallop and Mechanical valve sounds are heard Abdomen: soft, non-tender; bowel sounds normal; no masses,  no organomegaly Extremities: extremities normal, atraumatic, no cyanosis or edema Pulses: 2+ and symmetric Skin: Skin color, texture, turgor normal. No rashes or lesions Neurologic: Grossly normal Psych: Mood, affect normal  EKG: Sinus bradycardia PACs at 57, nonspecific T wave changes-personally reviewed  ASSESSMENT: 1. CAD status post 2 vessel CABG (SVG to diagonal SVG to PDA) in 2004 2. Status post ATS mechanical aortic valve 3. Lifetime anticoagulation on warfarin 4. HTN 5. Dyslipidemia 6. DM2 7. Encounter for DOT physical  PLAN: 1.   Billy Reilly denies any chest pain or worsening shortness of breath.  His echo last year showed normal mechanical aortic valve function with normal LVEF.  His INR is therapeutic on warfarin.  His diabetes is not at goal with A1c less than 7.  I will defer this to his primary care provider.  His lipids are also higher than ideal.  His goal LDL is less than 70.  We discussed dietary changes and weight loss that would be helpful for this.  I propose giving him 3 to 6 months to improve this as he says he his PCP gets blood work every 3 months.  If he has not had significant improvement in his lipid profile, I would recommend adding ezetimibe which could be combined with simvastatin as a generic to further lower cholesterol to LDL goal less than 70.  I would also consider him a candidate for Jardiance, given his history of coronary artery disease and diabetes for mortality benefit.  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  Plan follow-up annually or sooner as necessary.  [name], MD, [facility], [place] Director of the Advanced Lipid Disorders &  Cardiovascular Risk Reduction Clinic Diplomate of the American Board of Clinical Lipidology Attending Cardiologist  Direct  Dial: [phone]  Fax: [phone]  Website:  [email or web address]  01/10/2019, 8:15 AM

## 2019-01-10 NOTE — Patient Instructions (Signed)
Medication Instructions:  Your physician recommends that you continue on your current medications as directed. Please refer to the Current Medication list given to you today.  If you need a refill on your cardiac medications before your next appointment, please call your pharmacy.   Lab work: None ordered  Testing/Procedures: None ordered  Follow-Up: At [facility], you and your health needs are our priority.  As part of our continuing mission to provide you with exceptional heart care, we have created designated Provider Care Teams.  These Care Teams include your primary Cardiologist (physician) and Advanced Practice Providers (APPs -  Physician Assistants and Nurse Practitioners) who all work together to provide you with the care you need, when you need it. You will need a follow up appointment in 12 months.  Please call our office 2 months in advance to schedule this appointment.  You may see  Dr.Hilty or one of the following Advanced Practice Providers on your designated Care Team: Almyra Deforest, [place] . Fabian Sharp, PA-C

## 2019-01-10 NOTE — Telephone Encounter (Signed)
Advised patient that Dr.Hilty has completed his DOT letter and it will be left at the front desk for him to pick. Patient voiced appreciation for the assistance.

## 2019-01-19 ENCOUNTER — Other Ambulatory Visit: Payer: Self-pay | Admitting: Internal Medicine

## 2019-02-09 ENCOUNTER — Ambulatory Visit: Payer: Self-pay | Admitting: Physician Assistant

## 2019-02-21 ENCOUNTER — Telehealth: Payer: Self-pay

## 2019-02-21 NOTE — Telephone Encounter (Signed)

## 2019-02-21 NOTE — Progress Notes (Signed)
MEDICARE ANNUAL WELLNESS VISIT AND FOLLOW UP Assessment:    Diagnoses and all orders for this visit:  Encounter for Medicare annual wellness exam  Coronary artery disease due to lipid rich plaque Control blood pressure, cholesterol, glucose, increase exercise.  Followed by cardiology -     Lipid panel  Essential hypertension Continue medication Monitor blood pressure at home; call if consistently over 140/80 Continue DASH diet.   Reminder to go to the ER if any CP, SOB, nausea, dizziness, severe HA, changes vision/speech, left arm numbness and tingling and jaw pain.  Type II diabetes mellitus with nephropathy [facility]) Education: Reviewed 'ABCs' of diabetes management (respective goals in parentheses):  A1C (<7), blood pressure (<130/80), and cholesterol (LDL <70) Eye Exam yearly and Dental Exam every 6 months - diabetic eye exam report requested from Dr. Delman Cheadle Dietary recommendations Physical Activity recommendations Just had foot exam - defer today -     Hemoglobin A1c  H/O mechanical aortic valve replacement Continue coumadin; followed by cardiology  Hx of CABG Control blood pressure, cholesterol, glucose, increase exercise.  Followed by cardiolgy  Long term current use of anticoagulant therapy Managed by cardiology Discussed if patient falls to immediately contact office or go to ER. Discussed foods that can increase or decrease Coumadin levels.  Patient understands to call the office before starting a new medication.  Medication management -     CBC with Differential/Platelet -     CMPGFR  Mixed hyperlipidemia Continue medications Continue low cholesterol diet and exercise.  Check lipid panel.  -     Lipid panel -     TSH  Obesity (BMI 30.0-34.9) Long discussion about weight loss, diet, and exercise Recommended diet heavy in fruits and veggies and low in animal meats, cheeses, and dairy products, appropriate calorie intake Discussed appropriate weight for height  and initial goal (200 lb) Follow up at next visit  Umbilical hernia without obstruction and without gangrene No complications; continue to monitor  Vitamin B12 deficiency Continue supplement; check levels at CPE  Vitamin D deficiency Patient has not been supplementing regularly; discussed risks/benefits - goal is 70-100, he will increase frequency Defer vitamin D today    Over 30 minutes of exam, counseling, chart review, and critical decision making was performed  Future Appointments  Date Time Provider [place]  06/07/2019 11:00 AM Unk Pinto, MD [facility] None  12/01/2019 10:00 AM Unk Pinto, MD [facility] None     Plan:   During the course of the visit the patient was educated and counseled about appropriate screening and preventive services including:    Pneumococcal vaccine   Influenza vaccine  Prevnar 13  Td vaccine  Screening electrocardiogram  Colorectal cancer screening  Diabetes screening  Glaucoma screening  Nutrition counseling    Subjective:  Billy Reilly is a 78 y.o. male with hx of ASCAD/CABG/AoVR who presents for Medicare Annual Wellness Visit and 3 month follow up for HTN, hyperlipidemia, T2DM, and vitamin D Def. He has hx of CABG & Mech AoVR in 2004; he is on coumadin and followed by coumadin clinic. Nuclear Stress test was negative in 2016 by Dr Debara Pickett.   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   BMI is Body mass index is 31.73 kg/m., he has been working on diet and exercise. Weight goal < 200 lb, he has been reducing breakfast meat, eggs, doing oatmeal instead, doing protein + veggies for meal whenever possible, limited evening intake.  He drinks diet mountain dew - <1 bottle daily. He drinks unsweet tea.  Wt Readings from Last 3 Encounters:  02/22/19 218 lb (98.9 kg)  01/10/19 223 lb (101.2 kg)  12/16/18 226 lb 9.6 oz (102.8 kg)   His blood pressure has been  controlled at home, today their BP is BP: 140/80 He does not workout. He denies chest pain, shortness of breath, dizziness.   He is on cholesterol medication (simvastatin 40 mg daily) and denies myalgias. His cholesterol is not at goal for LDL <70. The cholesterol last visit was:   Lab Results  Component Value Date   CHOL 146 11/10/2018   HDL 40 (L) 11/10/2018   LDLCALC 89 11/10/2018   TRIG 81 11/10/2018   CHOLHDL 3.7 11/10/2018   He has not been working on diet and exercise for T2DM (metformin 2000 mg daily, glipizide 10 mg TID), and denies foot ulcerations, hypoglycemia , increased appetite, nausea, paresthesia of the feet, polydipsia, polyuria, visual disturbances, vomiting and weight loss. He checks a fasting glucose once a week or so, estimates running 180-200. Last A1C in the office was:  Lab Results  Component Value Date   HGBA1C 8.4 (H) 11/10/2018   Last GFR Lab Results  Component Value Date   GFRNONAA 93 11/10/2018    Patient is not on Vitamin D supplement and very low at recent check:    Lab Results  Component Value Date   VD25OH 36 11/10/2018      Medication Review: Current Outpatient Medications on File Prior to Visit  Medication Sig Dispense Refill  . aspirin 81 MG tablet Take 81 mg by mouth daily.      . CHOLECALCIFEROL PO Take 1,000 Units by mouth 2 (two) times daily.    Marland Kitchen CINNAMON PO Take 1,000 mg by mouth 3 (three) times daily.     . clotrimazole-betamethasone (LOTRISONE) cream APPLY TO AFFECTED AREA TWICE A DAY FOR 2 WEEKS (Patient taking differently: as needed. APPLY TO AFFECTED AREA TWICE A DAY FOR 2 WEEKS) 45 g 3  . cyanocobalamin (,VITAMIN B-12,) 1000 MCG/ML injection INJECT 1/2 ML EVERY MONTH 10 mL 2  . enalapril (VASOTEC) 20 MG tablet TAKE 2 TABLETS BY MOUTH EVERY DAY 180 tablet 0  . famotidine (PEPCID) 20 MG tablet Take 1 tablet 2 x /day with meals for Acid Indigestion 180 tablet 3  . glipiZIDE (GLUCOTROL) 10 MG tablet TAKE 1 TABLET BY MOUTH 3 TIMES A  DAY BEFORE MEALS 270 tablet 3  . glucose blood (ONE TOUCH ULTRA TEST) test strip CHECK BLOOD SUGAR ONCE DAILY 100 each 1  . metFORMIN (GLUCOPHAGE-XR) 500 MG 24 hr tablet TAKE 2 TABLETS BY MOUTH TWICE A DAY WITH A MEAL 360 tablet 1  . metoprolol succinate (TOPROL-XL) 50 MG 24 hr tablet TAKE 1 TABLET (50 MG TOTAL) BY MOUTH DAILY. TAKE WITH OR IMMEDIATELY FOLLOWING A MEAL. 90 tablet 3  . simvastatin (ZOCOR) 40 MG tablet TAKE 1 TABLET BY MOUTH EVERY DAY 90 tablet 3  . terbinafine (LAMISIL) 250 MG tablet TAKE 1 TABLET BY MOUTH DAILY FOR 1 MONTH ON, 1 MONTH OFF UNTIL RUN OUT 90 tablet 0  . warfarin (COUMADIN) 5 MG tablet TAKE 1/2 TO 1 TABLET BY MOUTH DAILY OR AS DIRECTED (Patient taking differently: Takes 1/2 tablet three days a week and 1 tablet four days a week) 90 tablet 1   No current facility-administered medications on file prior to visit.     Allergies: No Known Allergies  Current Problems (verified)  has Umbilical hernia; Long term current use of anticoagulant therapy; H/O mechanical aortic valve replacement; Hyperlipidemia associated with type 2 diabetes mellitus ([place]); Type II diabetes mellitus with nephropathy ([place]); Vitamin D deficiency; Vitamin B12 deficiency; Essential hypertension; Medication management; Coronary artery disease due to lipid rich plaque; Hx of CABG; Encounter for Medicare annual wellness exam; and Obesity (BMI 30.0-34.9) on their problem list.  Screening Tests Immunization History  Administered Date(s) Administered  . Influenza Whole 09/01/2012  . Influenza, High Dose Seasonal PF 09/27/2015  . Influenza-Unspecified 08/25/2016, 08/24/2018  . Pneumococcal Conjugate-13 12/18/2015  . Pneumococcal Polysaccharide-23 09/01/2012  . Tdap 11/25/2007   Preventative care: Last colonoscopy: 2011  Prior vaccinations: TD or Tdap: 2009 DUE patient declines   Influenza: 2019  Pneumococcal: 2013 Prevnar13: 2017 Shingles/Zostavax: declines  Names of Other  Physician/Practitioners you currently use: 1. [facility] here for primary care 2. Dr. Delman Cheadle, eye doctor, last visit 10/2018 - report requested 3. Dr. Delphia Grates, dentist, last visit 12/2018, goes q16m  Patient Care Team: Unk Pinto, MD as PCP - General (Internal Medicine) Debara Pickett Nadean Corwin, MD as Consulting Physician (Cardiology) Laurence Spates, MD as Consulting Physician (Gastroenterology)  Surgical: He  has a past surgical history that includes Aortic valve replacement (07/18/2003); Hernia repair (09/11/2011); Aortic valve replacement (avr)/coronary artery bypass grafting (cabg) (07/18/2003); Cardiac catheterization (07/13/2003); TEE without cardioversion (07/18/2003); transthoracic echocardiogram (02/21/2013); and NM MYOCAR PERF WALL MOTION (02/12/2010). Family His family history includes Cancer in his brother and sister; Heart attack in his father; Heart disease in his father; Stroke in his mother. Social history  He reports that he has never smoked. He has never used smokeless tobacco. He reports that he does not drink alcohol or use drugs.  MEDICARE WELLNESS OBJECTIVES: Physical activity: Current Exercise Habits: The patient has a physically strenuous job, but has no regular exercise apart from work., Exercise limited by: None identified Cardiac risk factors: Cardiac Risk Factors include: diabetes mellitus;male gender;advanced age (>8men, >1 women);dyslipidemia;hypertension;obesity (BMI >30kg/m2) Depression/mood screen:   Depression screen [facility] 2/9 02/22/2019  Decreased Interest 0  Down, Depressed, Hopeless 0  PHQ - 2 Score 0    ADLs:  In your present state of health, do you have any difficulty performing the following activities: 02/22/2019 11/10/2018  Hearing? N N  Vision? N N  Difficulty concentrating or making decisions? N N  Walking or climbing stairs? N N  Dressing or bathing? N N  Doing errands, shopping? N N  Some recent data  might be hidden     Cognitive Testing  Alert? Yes  Normal Appearance?Yes  Oriented to person? Yes  Place? Yes   Time? Yes  Recall of three objects?  Yes  Can perform simple calculations? Yes  Displays appropriate judgment?Yes  Can read the correct time from a watch face?Yes  EOL planning: Does Patient Have a Medical Advance Directive?: Yes Type of Advance Directive: Healthcare Power of Attorney, Living will Does patient want to make changes to medical advance directive?: No - Patient declined Copy of [place] in Chart?: No - copy requested   Objective:   Today's Vitals   02/22/19 1051  BP: 140/80  Pulse: 60  Temp: (!) 97.3 F (36.3 C)  SpO2: 97%  Weight: 218 lb (98.9 kg)  Height: 5' 9.5" (1.765 m)   Body mass index is 31.73 kg/m.  General appearance: alert, no distress, WD/WN, male HEENT: normocephalic, sclerae anicteric, TMs pearly, nares patent, no discharge or erythema, pharynx normal  Oral cavity: MMM, no lesions Neck: supple, no lymphadenopathy, no thyromegaly, no masses Heart: RRR, normal S1, S2, no murmurs Lungs: CTA bilaterally, no wheezes, rhonchi, or rales Abdomen: +bs, soft, non tender, non distended, no masses, no hepatomegaly, no splenomegaly Musculoskeletal: nontender, no swelling, no obvious deformity Extremities: no edema, no cyanosis, no clubbing Pulses: 2+ symmetric, upper and lower extremities, normal cap refill Neurological: alert, oriented x 3, CN2-12 intact, strength normal upper extremities and lower extremities, sensation normal throughout, DTRs 2+ throughout, no cerebellar signs, gait normal Psychiatric: normal affect, behavior normal, pleasant   Medicare Attestation I have personally reviewed: The patient's medical and social history Their use of alcohol, tobacco or illicit drugs Their current medications and supplements The patient's functional ability including ADLs,fall risks, home safety risks, cognitive, and hearing  and visual impairment Diet and physical activities Evidence for depression or mood disorders  The patient's weight, height, BMI, and visual acuity have been recorded in the chart.  I have made referrals, counseling, and provided education to the patient based on review of the above and I have provided the patient with a written personalized care plan for preventive services.     Izora Ribas, NP   02/22/2019

## 2019-02-22 ENCOUNTER — Ambulatory Visit (INDEPENDENT_AMBULATORY_CARE_PROVIDER_SITE_OTHER): Payer: PPO | Admitting: Adult Health

## 2019-02-22 ENCOUNTER — Encounter: Payer: Self-pay | Admitting: Adult Health

## 2019-02-22 ENCOUNTER — Other Ambulatory Visit: Payer: Self-pay

## 2019-02-22 ENCOUNTER — Ambulatory Visit (INDEPENDENT_AMBULATORY_CARE_PROVIDER_SITE_OTHER): Payer: PPO | Admitting: Pharmacist

## 2019-02-22 VITALS — BP 140/80 | HR 60 | Temp 97.3°F | Ht 69.5 in | Wt 218.0 lb

## 2019-02-22 DIAGNOSIS — I2510 Atherosclerotic heart disease of native coronary artery without angina pectoris: Secondary | ICD-10-CM | POA: Diagnosis not present

## 2019-02-22 DIAGNOSIS — E1121 Type 2 diabetes mellitus with diabetic nephropathy: Secondary | ICD-10-CM

## 2019-02-22 DIAGNOSIS — Z951 Presence of aortocoronary bypass graft: Secondary | ICD-10-CM | POA: Diagnosis not present

## 2019-02-22 DIAGNOSIS — E1169 Type 2 diabetes mellitus with other specified complication: Secondary | ICD-10-CM | POA: Diagnosis not present

## 2019-02-22 DIAGNOSIS — I10 Essential (primary) hypertension: Secondary | ICD-10-CM

## 2019-02-22 DIAGNOSIS — K429 Umbilical hernia without obstruction or gangrene: Secondary | ICD-10-CM | POA: Diagnosis not present

## 2019-02-22 DIAGNOSIS — Z79899 Other long term (current) drug therapy: Secondary | ICD-10-CM | POA: Diagnosis not present

## 2019-02-22 DIAGNOSIS — Z0000 Encounter for general adult medical examination without abnormal findings: Secondary | ICD-10-CM

## 2019-02-22 DIAGNOSIS — Z952 Presence of prosthetic heart valve: Secondary | ICD-10-CM

## 2019-02-22 DIAGNOSIS — E669 Obesity, unspecified: Secondary | ICD-10-CM | POA: Diagnosis not present

## 2019-02-22 DIAGNOSIS — R6889 Other general symptoms and signs: Secondary | ICD-10-CM | POA: Diagnosis not present

## 2019-02-22 DIAGNOSIS — B351 Tinea unguium: Secondary | ICD-10-CM

## 2019-02-22 DIAGNOSIS — I2583 Coronary atherosclerosis due to lipid rich plaque: Secondary | ICD-10-CM

## 2019-02-22 DIAGNOSIS — E559 Vitamin D deficiency, unspecified: Secondary | ICD-10-CM

## 2019-02-22 DIAGNOSIS — Z0001 Encounter for general adult medical examination with abnormal findings: Secondary | ICD-10-CM | POA: Diagnosis not present

## 2019-02-22 DIAGNOSIS — E538 Deficiency of other specified B group vitamins: Secondary | ICD-10-CM | POA: Diagnosis not present

## 2019-02-22 DIAGNOSIS — E785 Hyperlipidemia, unspecified: Secondary | ICD-10-CM | POA: Diagnosis not present

## 2019-02-22 DIAGNOSIS — Z7901 Long term (current) use of anticoagulants: Secondary | ICD-10-CM | POA: Diagnosis not present

## 2019-02-22 DIAGNOSIS — E66811 Obesity, class 1: Secondary | ICD-10-CM

## 2019-02-22 LAB — POCT INR: INR: 2.2 (ref 2.0–3.0)

## 2019-02-22 NOTE — Patient Instructions (Addendum)
  Mr. Billy Reilly , Thank you for taking time to come for your Medicare Wellness Visit. I appreciate your ongoing commitment to your health goals. Please review the following plan we discussed and let me know if I can assist you in the future.   These are the goals we discussed: Goals    . Blood Pressure < 120/80    . HEMOGLOBIN A1C < 8    . LDL CALC < 70    . Weight (lb) < 200 lb (90.7 kg)       This is a list of the screening recommended for you and due dates:  Health Maintenance  Topic Date Due  . Eye exam for diabetics  03/24/2019*  . Tetanus Vaccine  02/22/2020*  . Hemoglobin A1C  05/12/2019  . Complete foot exam   11/11/2019  . Flu Shot  Completed  . Pneumonia vaccines  Completed  *Topic was postponed. The date shown is not the original due date.   Please start taking vitamin D more consistently; high vitamin D levels can improve immune system and there is some data to suggest may improve coronavirus outcomes    Aim for 7+ servings of fruits and vegetables daily  65-80+ fluid ounces of water or unsweet tea for healthy kidneys  Limit to max 1 drink of alcohol per day; avoid smoking/tobacco  Limit animal fats in diet for cholesterol and heart health - choose grass fed whenever available  Avoid highly processed foods, and foods high in saturated/trans fats  Aim for low stress - take time to unwind and care for your mental health  Aim for 150 min of moderate intensity exercise weekly for heart health, and weights twice weekly for bone health  Aim for 7-9 hours of sleep daily

## 2019-02-23 ENCOUNTER — Other Ambulatory Visit: Payer: Self-pay | Admitting: Adult Health

## 2019-02-23 LAB — CBC WITH DIFFERENTIAL/PLATELET
Absolute Monocytes: 465 {cells}/uL (ref 200–950)
Basophils Absolute: 39 {cells}/uL (ref 0–200)
Basophils Relative: 0.7 %
Eosinophils Absolute: 101 {cells}/uL (ref 15–500)
Eosinophils Relative: 1.8 %
HCT: 44.3 % (ref 38.5–50.0)
Hemoglobin: 15 g/dL (ref 13.2–17.1)
Lymphs Abs: 997 {cells}/uL (ref 850–3900)
MCH: 30.2 pg (ref 27.0–33.0)
MCHC: 33.9 g/dL (ref 32.0–36.0)
MCV: 89.3 fL (ref 80.0–100.0)
MPV: 12.1 fL (ref 7.5–12.5)
Monocytes Relative: 8.3 %
Neutro Abs: 3998 {cells}/uL (ref 1500–7800)
Neutrophils Relative %: 71.4 %
Platelets: 154 10*3/uL (ref 140–400)
RBC: 4.96 Million/uL (ref 4.20–5.80)
RDW: 13.7 % (ref 11.0–15.0)
Total Lymphocyte: 17.8 %
WBC: 5.6 10*3/uL (ref 3.8–10.8)

## 2019-02-23 LAB — COMPLETE METABOLIC PANEL WITHOUT GFR
AG Ratio: 1.8 (calc) (ref 1.0–2.5)
ALT: 17 U/L (ref 9–46)
AST: 14 U/L (ref 10–35)
Albumin: 4.2 g/dL (ref 3.6–5.1)
Alkaline phosphatase (APISO): 91 U/L (ref 35–144)
BUN: 12 mg/dL (ref 7–25)
CO2: 27 mmol/L (ref 20–32)
Calcium: 9.2 mg/dL (ref 8.6–10.3)
Chloride: 101 mmol/L (ref 98–110)
Creat: 0.79 mg/dL (ref 0.70–1.18)
GFR, Est African American: 101 mL/min/{1.73_m2}
GFR, Est Non African American: 87 mL/min/{1.73_m2}
Globulin: 2.3 g/dL (ref 1.9–3.7)
Glucose, Bld: 279 mg/dL — ABNORMAL HIGH (ref 65–99)
Potassium: 4.8 mmol/L (ref 3.5–5.3)
Sodium: 137 mmol/L (ref 135–146)
Total Bilirubin: 0.7 mg/dL (ref 0.2–1.2)
Total Protein: 6.5 g/dL (ref 6.1–8.1)

## 2019-02-23 LAB — LIPID PANEL
Cholesterol: 159 mg/dL
HDL: 38 mg/dL — ABNORMAL LOW
LDL Cholesterol (Calc): 100 mg/dL — ABNORMAL HIGH
Non-HDL Cholesterol (Calc): 121 mg/dL
Total CHOL/HDL Ratio: 4.2 (calc)
Triglycerides: 117 mg/dL

## 2019-02-23 LAB — HEMOGLOBIN A1C
Hgb A1c MFr Bld: 8.8 %{Hb} — ABNORMAL HIGH
Mean Plasma Glucose: 206 (calc)
eAG (mmol/L): 11.4 (calc)

## 2019-02-23 LAB — TSH: TSH: 1.2 m[IU]/L (ref 0.40–4.50)

## 2019-02-23 LAB — MAGNESIUM: Magnesium: 1.9 mg/dL (ref 1.5–2.5)

## 2019-02-23 MED ORDER — GLIPIZIDE 10 MG PO TABS: ORAL_TABLET | ORAL | 1 refills | Status: DC

## 2019-03-28 ENCOUNTER — Other Ambulatory Visit: Payer: Self-pay | Admitting: Internal Medicine

## 2019-04-15 ENCOUNTER — Telehealth: Payer: Self-pay

## 2019-04-15 NOTE — Telephone Encounter (Signed)

## 2019-04-19 ENCOUNTER — Ambulatory Visit (INDEPENDENT_AMBULATORY_CARE_PROVIDER_SITE_OTHER): Payer: PPO | Admitting: *Deleted

## 2019-04-19 ENCOUNTER — Other Ambulatory Visit: Payer: Self-pay

## 2019-04-19 DIAGNOSIS — Z7901 Long term (current) use of anticoagulants: Secondary | ICD-10-CM

## 2019-04-19 DIAGNOSIS — Z952 Presence of prosthetic heart valve: Secondary | ICD-10-CM | POA: Diagnosis not present

## 2019-04-19 LAB — POCT INR: INR: 2.5 (ref 2.0–3.0)

## 2019-04-19 NOTE — Patient Instructions (Signed)
Description   Spoke with pt and instructed pt to continue with 1 tablet daily except 1/2 tablet each Sunday, Tuesday and Thursday.  Repeat INR in 9 weeks per pt.  Kristin/Raquel at [phone].

## 2019-06-04 ENCOUNTER — Other Ambulatory Visit: Payer: Self-pay | Admitting: Internal Medicine

## 2019-06-06 ENCOUNTER — Encounter: Payer: Self-pay | Admitting: Internal Medicine

## 2019-06-06 NOTE — Progress Notes (Signed)
History of Present Illness:      This very nice 78 y.o. MWM presents for 6 month follow up with HTN, HLD, Pre-Diabetes and Vitamin D Deficiency.       Patient is treated for HTN (1994) & BP has been controlled at home. Today's BP at goal - 138/84.   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 Patient is on coumadin followed at [facility] coumadin clinic.  Patient had a negative Myoview in 2016 per Dr Debara Pickett. Patient has had no complaints of any cardiac type chest pain, palpitations, dyspnea / orthopnea / PND, dizziness, claudication, or dependent edema.      Hyperlipidemia is not controlled with diet & meds. Patient denies myalgias or other med SE's. 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  He's on a triple drug regimen with Metformin, Glipizide & Wilder Glade.   Patient denies symptoms of reactive hypoglycemia, diabetic polys, paresthesias or visual blurring.  Last A1c was not at goal:  Lab Results  Component Value Date   HGBA1C 9.5 (H) 06/07/2019       Further, the patient also has history of Vitamin D Deficiency ("28" / 2012) and he does not supplements vitamin D as repeatedly advised over the years.. Last vitamin D was still very low: Lab Results  Component Value Date   VD25OH 33 06/07/2019   Current Outpatient Medications on File Prior to Visit  Medication Sig  . aspirin 81 MG tablet Take 81 mg by mouth daily.    . CHOLECALCIFEROL PO Take 1,000 Units by mouth 2 (two) times daily.  Marland Kitchen CINNAMON PO Take 1,000 mg by mouth 3 (three) times daily.   . clotrimazole-betamethasone (LOTRISONE) cream APPLY TO AFFECTED AREA TWICE A DAY FOR 2 WEEKS  . cyanocobalamin (,VITAMIN B-12,) 1000 MCG/ML injection INJECT 1/2 ML EVERY MONTH  .  enalapril (VASOTEC) 20 MG tablet TAKE 2 TABLETS BY MOUTH EVERY DAY  . famotidine (PEPCID) 20 MG tablet Take 1 tablet 2 x /day with meals for Acid Indigestion  . glipiZIDE (GLUCOTROL) 10 MG tablet Take 1 tab daily with each meal; take 2 tabs with largest meal of the day for total of 40 mg daily.  Marland Kitchen glucose blood (ONE TOUCH ULTRA TEST) test strip CHECK BLOOD SUGAR ONCE DAILY  . metFORMIN (GLUCOPHAGE-XR) 500 MG 24 hr tablet TAKE 2 TABLETS BY MOUTH TWICE A DAY WITH A MEAL  . metoprolol succinate (TOPROL-XL) 50 MG 24 hr tablet TAKE 1 TABLET (50 MG TOTAL) BY MOUTH DAILY. TAKE WITH OR IMMEDIATELY FOLLOWING A MEAL.  . simvastatin (ZOCOR) 40 MG tablet TAKE 1 TABLET BY MOUTH EVERY DAY  . terbinafine (LAMISIL) 250 MG tablet TAKE 1 TABLET BY MOUTH DAILY FOR 1 MONTH ON, 1 MONTH OFF UNTIL RUN OUT  . warfarin (COUMADIN) 5 MG tablet TAKE 1/2 TO 1 TABLET BY MOUTH DAILY OR AS DIRECTED   No current facility-administered medications on file prior to visit.    No Known Allergies  PMHx:   Past Medical History:  Diagnosis Date  . Anemia   . AS (aortic stenosis)    valve replacement-st. jude mechanical valve  . ASHD (arteriosclerotic heart disease)   . CAD (coronary  artery disease), native coronary artery    CABG 07/18/2003  . Cancer [facility])    prostate  . GERD (gastroesophageal reflux disease)   . History of nuclear stress test 02/12/10   EF 67%, normal perfusion  . Hyperlipidemia   . Hypertension   . Morbid obesity ([place])   . Type II or unspecified type diabetes mellitus without mention of complication, not stated as uncontrolled   . Umbilical hernia   . Vitamin B12 deficiency   . Vitamin D deficiency    Immunization History  Administered Date(s) Administered  . Influenza Whole 09/01/2012  . Influenza, High Dose Seasonal PF 09/27/2015  . Influenza-Unspecified 08/25/2016, 08/24/2018  . Pneumococcal Conjugate-13 12/18/2015  . Pneumococcal Polysaccharide-23 09/01/2012  . Tdap 11/25/2007   Past  Surgical History:  Procedure Laterality Date  . AORTIC VALVE REPLACEMENT  07/18/2003   18mm ATS-AP valve  . AORTIC VALVE REPLACEMENT (AVR)/CORONARY ARTERY BYPASS GRAFTING (CABG)  07/18/2003   AVR; bypass x 2-SVG to first diag, SVG to posterior descending  . CARDIAC CATHETERIZATION  07/13/2003   AS-aortic valve gradient 21mmhg, valve area 1.1; obstruction of ostium of posterior lateral branch with obstruction in the ostium of first diagnonal  . HERNIA REPAIR  97/35/3299   RIH, umbilical  . NM MYOCAR PERF WALL MOTION  02/12/2010   protocol:Bruce, EF 67%, exercise cap 7 METS, low risk scan  . TEE WITHOUT CARDIOVERSION  07/18/2003   no evidence of PE, Aortic valve heavily calcified,   . TRANSTHORACIC ECHOCARDIOGRAM  02/21/2013   EF60-65%, Septal motion showed paradox. atrium moderately dilated   FHx:    Reviewed / unchanged  SHx:    Reviewed / unchanged   Systems Review:  Constitutional: Denies fever, chills, wt changes, headaches, insomnia, fatigue, night sweats, change in appetite. Eyes: Denies redness, blurred vision, diplopia, discharge, itchy, watery eyes.  ENT: Denies discharge, congestion, post nasal drip, epistaxis, sore throat, earache, hearing loss, dental pain, tinnitus, vertigo, sinus pain, snoring.  CV: Denies chest pain, palpitations, irregular heartbeat, syncope, dyspnea, diaphoresis, orthopnea, PND, claudication or edema. Respiratory: denies cough, dyspnea, DOE, pleurisy, hoarseness, laryngitis, wheezing.  Gastrointestinal: Denies dysphagia, odynophagia, heartburn, reflux, water brash, abdominal pain or cramps, nausea, vomiting, bloating, diarrhea, constipation, hematemesis, melena, hematochezia  or hemorrhoids. Genitourinary: Denies dysuria, frequency, urgency, nocturia, hesitancy, discharge, hematuria or flank pain. Musculoskeletal: Denies arthralgias, myalgias, stiffness, jt. swelling, pain, limping or strain/sprain.  Skin: Denies pruritus, rash, hives, warts, acne,  eczema or change in skin lesion(s). Neuro: No weakness, tremor, incoordination, spasms, paresthesia or pain. Psychiatric: Denies confusion, memory loss or sensory loss. Endo: Denies change in weight, skin or hair change.  Heme/Lymph: No excessive bleeding, bruising or enlarged lymph nodes.  Physical Exam  BP 138/84   Pulse 76   Temp (!) 97 F (36.1 C)   Resp 16   Ht 5\' 9"  (1.753 m)   Wt 222 lb (100.7 kg)   BMI 32.78 kg/m   Appears over nourished  and in no distress.  Eyes: PERRLA, EOMs, conjunctiva no swelling or erythema. Sinuses: No frontal/maxillary tenderness ENT/Mouth: EAC's clear, TM's nl w/o erythema, bulging. Nares clear w/o erythema, swelling, exudates. Oropharynx clear without erythema or exudates. Oral hygiene is good. Tongue normal, non obstructing. Hearing intact.  Neck: Supple. Thyroid not palpable. Car 2+/2+ without bruits, nodes or JVD. Chest: Respirations nl with BS clear & equal w/o rales, rhonchi, wheezing or stridor.  Cor: Heart sounds normal w/ regular rate and rhythm without sig. murmurs, gallops, clicks or rubs. Peripheral pulses  normal and equal  without edema.  Abdomen: Soft & bowel sounds normal. Non-tender w/o guarding, rebound, hernias, masses or organomegaly.  Lymphatics: Unremarkable.  Musculoskeletal: Full ROM all peripheral extremities, joint stability, 5/5 strength and normal gait.  Skin: Warm, dry without exposed rashes, lesions or ecchymosis apparent.  Neuro: Cranial nerves intact, reflexes equal bilaterally. Sensory-motor testing grossly intact. Tendon reflexes grossly intact.  Pysch: Alert & oriented x 3.  Insight and judgement nl & appropriate. No ideations.  Assessment and Plan:  1. Essential hypertension  - Continue medication, monitor blood pressure at home.  - Continue DASH diet.  Reminder to go to the ER if any CP,  SOB, nausea, dizziness, severe HA, changes vision/speech.  - CBC with Differential/Platelet - COMPLETE METABOLIC PANEL  WITH GFR - Magnesium - TSH  2. Hyperlipidemia, mixed  - Continue diet/meds, exercise,& lifestyle modifications.  - Continue monitor periodic cholesterol/liver & renal functions   - Lipid panel - TSH  3. Type 2 diabetes mellitus with stage 2 chronic kidney disease, without long-term current use of insulin (HCC)  - Continue diet, exercise  - Lifestyle modifications.  - Monitor appropriate labs.  - Hemglobin A1c - Insulin, random  4. Vitamin D deficiency  - Continue supplementation.  - VITAMIN D 25 Hydroxyl  5. Coronary artery disease due to lipid rich plaque  - Lipid panel  6. Type II diabetes mellitus with nephropathy (HCC)  - Hemoglobin A1c - Insulin, random  7. Hyperlipidemia associated with type 2 diabetes mellitus (HCC)  - Lipid panel - Hemoglobin A1c - Insulin, random  8. Medication management  - CBC with Differential/Platelet - COMPLETE METABOLIC PANEL WITH GFR - Magnesium - Lipid panel - TSH - Hemoglobin A1c - Insulin, random - VITAMIN D 25 Hydroxyl       Discussed  regular exercise, BP monitoring, weight control to achieve/maintain BMI less than 25 and discussed med and SE's. Recommended labs to assess and monitor clinical status with further disposition pending results of labs.  I discussed the assessment and treatment plan with the patient. The patient was provided an opportunity to ask questions and all were answered. The patient agreed with the plan and demonstrated an understanding of the instructions. I provided over 30 minutes of exam, counseling, chart review and  complex critical decision making.   Kirtland Bouchard, MD

## 2019-06-06 NOTE — Patient Instructions (Signed)

## 2019-06-07 ENCOUNTER — Other Ambulatory Visit: Payer: Self-pay

## 2019-06-07 ENCOUNTER — Ambulatory Visit (INDEPENDENT_AMBULATORY_CARE_PROVIDER_SITE_OTHER): Payer: PPO | Admitting: Internal Medicine

## 2019-06-07 VITALS — BP 138/84 | HR 76 | Temp 97.0°F | Resp 16 | Ht 69.0 in | Wt 222.0 lb

## 2019-06-07 DIAGNOSIS — Z79899 Other long term (current) drug therapy: Secondary | ICD-10-CM | POA: Diagnosis not present

## 2019-06-07 DIAGNOSIS — E782 Mixed hyperlipidemia: Secondary | ICD-10-CM | POA: Diagnosis not present

## 2019-06-07 DIAGNOSIS — N182 Chronic kidney disease, stage 2 (mild): Secondary | ICD-10-CM | POA: Diagnosis not present

## 2019-06-07 DIAGNOSIS — I2510 Atherosclerotic heart disease of native coronary artery without angina pectoris: Secondary | ICD-10-CM | POA: Diagnosis not present

## 2019-06-07 DIAGNOSIS — I10 Essential (primary) hypertension: Secondary | ICD-10-CM | POA: Diagnosis not present

## 2019-06-07 DIAGNOSIS — E785 Hyperlipidemia, unspecified: Secondary | ICD-10-CM

## 2019-06-07 DIAGNOSIS — E559 Vitamin D deficiency, unspecified: Secondary | ICD-10-CM | POA: Diagnosis not present

## 2019-06-07 DIAGNOSIS — E1121 Type 2 diabetes mellitus with diabetic nephropathy: Secondary | ICD-10-CM | POA: Diagnosis not present

## 2019-06-07 DIAGNOSIS — I2583 Coronary atherosclerosis due to lipid rich plaque: Secondary | ICD-10-CM

## 2019-06-07 DIAGNOSIS — E1169 Type 2 diabetes mellitus with other specified complication: Secondary | ICD-10-CM

## 2019-06-07 DIAGNOSIS — E1122 Type 2 diabetes mellitus with diabetic chronic kidney disease: Secondary | ICD-10-CM

## 2019-06-08 LAB — COMPLETE METABOLIC PANEL WITHOUT GFR
AG Ratio: 2 (calc) (ref 1.0–2.5)
ALT: 17 U/L (ref 9–46)
AST: 17 U/L (ref 10–35)
Albumin: 4.5 g/dL (ref 3.6–5.1)
Alkaline phosphatase (APISO): 91 U/L (ref 35–144)
BUN: 13 mg/dL (ref 7–25)
CO2: 26 mmol/L (ref 20–32)
Calcium: 9.2 mg/dL (ref 8.6–10.3)
Chloride: 107 mmol/L (ref 98–110)
Creat: 0.76 mg/dL (ref 0.70–1.18)
GFR, Est African American: 103 mL/min/{1.73_m2}
GFR, Est Non African American: 89 mL/min/{1.73_m2}
Globulin: 2.2 g/dL (ref 1.9–3.7)
Glucose, Bld: 115 mg/dL — ABNORMAL HIGH (ref 65–99)
Potassium: 4.5 mmol/L (ref 3.5–5.3)
Sodium: 141 mmol/L (ref 135–146)
Total Bilirubin: 0.7 mg/dL (ref 0.2–1.2)
Total Protein: 6.7 g/dL (ref 6.1–8.1)

## 2019-06-08 LAB — LIPID PANEL
Cholesterol: 152 mg/dL
HDL: 38 mg/dL — ABNORMAL LOW
LDL Cholesterol (Calc): 93 mg/dL
Non-HDL Cholesterol (Calc): 114 mg/dL
Total CHOL/HDL Ratio: 4 (calc)
Triglycerides: 112 mg/dL

## 2019-06-08 LAB — INSULIN, RANDOM: Insulin: 57.2 u[IU]/mL — ABNORMAL HIGH

## 2019-06-08 LAB — HEMOGLOBIN A1C
Hgb A1c MFr Bld: 9.5 %{Hb} — ABNORMAL HIGH
Mean Plasma Glucose: 226 (calc)
eAG (mmol/L): 12.5 (calc)

## 2019-06-08 LAB — CBC WITH DIFFERENTIAL/PLATELET
Absolute Monocytes: 418 {cells}/uL (ref 200–950)
Basophils Absolute: 38 {cells}/uL (ref 0–200)
Basophils Relative: 0.8 %
Eosinophils Absolute: 91 {cells}/uL (ref 15–500)
Eosinophils Relative: 1.9 %
HCT: 45.6 % (ref 38.5–50.0)
Hemoglobin: 15.5 g/dL (ref 13.2–17.1)
Lymphs Abs: 1037 {cells}/uL (ref 850–3900)
MCH: 31.2 pg (ref 27.0–33.0)
MCHC: 34 g/dL (ref 32.0–36.0)
MCV: 91.8 fL (ref 80.0–100.0)
MPV: 12 fL (ref 7.5–12.5)
Monocytes Relative: 8.7 %
Neutro Abs: 3216 {cells}/uL (ref 1500–7800)
Neutrophils Relative %: 67 %
Platelets: 173 10*3/uL (ref 140–400)
RBC: 4.97 Million/uL (ref 4.20–5.80)
RDW: 14.8 % (ref 11.0–15.0)
Total Lymphocyte: 21.6 %
WBC: 4.8 10*3/uL (ref 3.8–10.8)

## 2019-06-08 LAB — MAGNESIUM: Magnesium: 1.7 mg/dL (ref 1.5–2.5)

## 2019-06-08 LAB — TSH: TSH: 1.9 m[IU]/L (ref 0.40–4.50)

## 2019-06-08 LAB — VITAMIN D 25 HYDROXY (VIT D DEFICIENCY, FRACTURES): Vit D, 25-Hydroxy: 33 ng/mL (ref 30–100)

## 2019-06-11 ENCOUNTER — Encounter: Payer: Self-pay | Admitting: Internal Medicine

## 2019-06-17 ENCOUNTER — Telehealth: Payer: Self-pay

## 2019-06-17 NOTE — Telephone Encounter (Signed)

## 2019-06-21 ENCOUNTER — Ambulatory Visit (INDEPENDENT_AMBULATORY_CARE_PROVIDER_SITE_OTHER): Payer: PPO | Admitting: Pharmacist Clinician (PhC)/ Clinical Pharmacy Specialist

## 2019-06-21 ENCOUNTER — Other Ambulatory Visit: Payer: Self-pay

## 2019-06-21 DIAGNOSIS — Z7901 Long term (current) use of anticoagulants: Secondary | ICD-10-CM

## 2019-06-21 DIAGNOSIS — Z952 Presence of prosthetic heart valve: Secondary | ICD-10-CM

## 2019-06-21 LAB — POCT INR: INR: 3.2 — AB (ref 2.0–3.0)

## 2019-08-23 ENCOUNTER — Ambulatory Visit (INDEPENDENT_AMBULATORY_CARE_PROVIDER_SITE_OTHER): Payer: PPO | Admitting: Pharmacist Clinician (PhC)/ Clinical Pharmacy Specialist

## 2019-08-23 ENCOUNTER — Other Ambulatory Visit: Payer: Self-pay

## 2019-08-23 DIAGNOSIS — Z952 Presence of prosthetic heart valve: Secondary | ICD-10-CM | POA: Diagnosis not present

## 2019-08-23 DIAGNOSIS — Z7901 Long term (current) use of anticoagulants: Secondary | ICD-10-CM

## 2019-08-23 LAB — POCT INR: INR: 3.3 — AB (ref 2.0–3.0)

## 2019-09-02 ENCOUNTER — Other Ambulatory Visit: Payer: Self-pay | Admitting: Internal Medicine

## 2019-09-04 ENCOUNTER — Other Ambulatory Visit: Payer: Self-pay | Admitting: Internal Medicine

## 2019-09-13 ENCOUNTER — Ambulatory Visit: Payer: PPO | Admitting: Physician Assistant

## 2019-09-13 ENCOUNTER — Other Ambulatory Visit: Payer: Self-pay

## 2019-09-13 ENCOUNTER — Encounter: Payer: Self-pay | Admitting: Adult Health Nurse Practitioner

## 2019-09-13 ENCOUNTER — Ambulatory Visit (INDEPENDENT_AMBULATORY_CARE_PROVIDER_SITE_OTHER): Payer: PPO | Admitting: Adult Health Nurse Practitioner

## 2019-09-13 VITALS — BP 130/70 | HR 64 | Temp 97.7°F | Wt 219.0 lb

## 2019-09-13 DIAGNOSIS — E669 Obesity, unspecified: Secondary | ICD-10-CM | POA: Diagnosis not present

## 2019-09-13 DIAGNOSIS — D6869 Other thrombophilia: Secondary | ICD-10-CM | POA: Diagnosis not present

## 2019-09-13 DIAGNOSIS — Z952 Presence of prosthetic heart valve: Secondary | ICD-10-CM | POA: Diagnosis not present

## 2019-09-13 DIAGNOSIS — Z7901 Long term (current) use of anticoagulants: Secondary | ICD-10-CM | POA: Diagnosis not present

## 2019-09-13 DIAGNOSIS — E1121 Type 2 diabetes mellitus with diabetic nephropathy: Secondary | ICD-10-CM

## 2019-09-13 DIAGNOSIS — E66811 Obesity, class 1: Secondary | ICD-10-CM

## 2019-09-13 DIAGNOSIS — K219 Gastro-esophageal reflux disease without esophagitis: Secondary | ICD-10-CM

## 2019-09-13 DIAGNOSIS — E1169 Type 2 diabetes mellitus with other specified complication: Secondary | ICD-10-CM

## 2019-09-13 DIAGNOSIS — I10 Essential (primary) hypertension: Secondary | ICD-10-CM

## 2019-09-13 DIAGNOSIS — E785 Hyperlipidemia, unspecified: Secondary | ICD-10-CM

## 2019-09-13 DIAGNOSIS — I2510 Atherosclerotic heart disease of native coronary artery without angina pectoris: Secondary | ICD-10-CM

## 2019-09-13 DIAGNOSIS — E538 Deficiency of other specified B group vitamins: Secondary | ICD-10-CM

## 2019-09-13 DIAGNOSIS — Z79899 Other long term (current) drug therapy: Secondary | ICD-10-CM

## 2019-09-13 DIAGNOSIS — E559 Vitamin D deficiency, unspecified: Secondary | ICD-10-CM | POA: Diagnosis not present

## 2019-09-13 DIAGNOSIS — N401 Enlarged prostate with lower urinary tract symptoms: Secondary | ICD-10-CM

## 2019-09-13 DIAGNOSIS — I2583 Coronary atherosclerosis due to lipid rich plaque: Secondary | ICD-10-CM

## 2019-09-13 NOTE — Progress Notes (Signed)
3 Month Follow Up   Assessment and Plan:   Billy Reilly was seen today for follow-up.  Diagnoses and all orders for this visit:  Essential hypertension Continue current medications: Enalapril 20mg , two tablets daily, metoprolol-XL 50mg  daily Monitor blood pressure at home; call if consistently over 130/80 Continue DASH diet.   Reminder to go to the ER if any CP, SOB, nausea, dizziness, severe HA, changes vision/speech, left arm numbness and tingling and jaw pain.  -     CBC with Differential/Platelet -     COMPLETE METABOLIC PANEL WITH GFR -     Magnesium  Hyperlipidemia associated with type 2 diabetes mellitus (HCC) Cholesterol Continue medications: Simvastatin 40mg   Continue low cholesterol diet and exercise.  Check lipid panel.  -     Lipid panel  Type II diabetes mellitus with nephropathy (HCC) Continue medications: Metformin 500mg  two tablets twice a day. Discussed general issues about diabetes pathophysiology and management. Education: Reviewed 'ABCs' of diabetes management (respective goals in parentheses):  A1C (<7), blood pressure (<130/80), and cholesterol (LDL <70) Dietary recommendations Encouraged aerobic exercise.  Discussed foot care, check daily Yearly retinal exam Dental exam every 6 months Monitor blood glucose, discussed goal for patient -     Hemoglobin A1c  Vitamin D deficiency -     VITAMIN D 25 Hydroxy (Vit-D Deficiency, Fractures) -     Cholecalciferol 1.25 MG (50000 UT) capsule; Take one tablet by mouth three days a week for twelve weeks.  Obesity (BMI 30.0-34.9) BMI 32 Discussed dietary and exercise modifications  Coronary artery disease due to lipid rich plaque Control weight, blood pressure, cholesterol Follows with cardiology  Vitamin B12 deficiency Continue supplemetation Injecting 1/41ml 1010mcg/ml every month.  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  Discussed if patient falls to immediately contact office or go to ER. Discussed foods that can increase or decrease Coumadin levels. Patient understands to call the office before starting a new medication. Follow up in one month.  -     Protime-INR BPH with LUTZ Doing well at this time Taking Flowmax 0.4mg , two tablets nightly Follow with Urology  GERD Doing well at this time Continue: Prilosec 40mg  daily PRN Diet discussed Monitor for triggers Avoid food with high acid content Avoid excessive cafeine Increase water intake  Medication management Continued    Continue diet and meds as discussed. Further disposition pending results of labs. Discussed med's effects and SE's.  Patient agrees with plan of care and opportunity to ask questions/voice concerns. Over 30 minutes of chart review, interview, exam, counseling, and critical decision making was performed.   Future Appointments  Date Time Provider [place]  09/20/2019 10:30 AM [facility]  12/20/2019 11:00 AM Unk Pinto, MD [facility] None  03/29/2020  2:00 PM Liane Comber, NP [facility] None    ----------------------------------------------------------------------------------------------------------------------  HPI 78 y.o. male  presents for 3 month follow up on HTN, HLD, GERD, Mechanical heart valve on Coumadin, BPH, DMII, weight and vitamin D deficiency.  Report over all he is doing well.  BMI is Body mass index is 32.34 kg/m., he has been working on diet and exercise. Wt Readings from Last 3 Encounters:  09/13/19 219 lb (99.3 kg)  06/07/19 222 lb (100.7 kg)  02/22/19 218 lb (98.9 kg)    His blood pressure has been controlled at home, today their BP is BP: 130/70  He does  workout. He denies any cardiac symptoms, chest pains, palpitations, shortness of breath, dizziness or lower extremity edema.     He is on  cholesterol medication Simvastatin and denies myalgias. His cholesterol is at goal. The cholesterol last visit was:   Lab Results  Component Value Date   CHOL 152 06/07/2019   HDL 38 (L) 06/07/2019   LDLCALC 93 06/07/2019   TRIG 112 06/07/2019   CHOLHDL 4.0 06/07/2019    He has been working on diet and exercise for prediabetes, and denies nausea, paresthesia of the feet, polydipsia, polyuria, visual disturbances, vomiting and weight loss. Last A1C in the office was:   He reports his fasting has been around 200. Lab Results  Component Value Date   HGBA1C 9.5 (H) 06/07/2019   Patient is on Vitamin D supplement.   Lab Results  Component Value Date   VD25OH 33 06/07/2019       Patient is on Coumadin for Mechanical Heart Valve.  Patient's last INR is  Lab Results  Component Value Date   INR 3.3 (A) 08/23/2019   INR 3.2 (A) 06/21/2019   INR 2.5 04/19/2019    Patient denies SOB, CP, dizziness, nose bleeds, easy bleeding, and blood in stool/urine. His coumadin dose was changed last visit. He has not taken ABX, has not missed any doses and denies a fall.     BPH Currently he is symptomatic and denies or endorses nocturia x 2-3 a night and denies urinary hesitancy, urinary frequency, incomplete voiding, double voiding, weak stream, perineal discomfort, dysuria and hematuria.  He is taking Tamulosin (Flomax). He is following with urology at this time and last results were in normal. Lab Results  Component Value Date   PSA 0.2 11/10/2018     Current Outpatient Medications on File Prior to Visit  Medication Sig Dispense Refill  . aspirin 81 MG tablet Take 81 mg by mouth daily.      . CHOLECALCIFEROL PO Take 1,000 Units by mouth 2 (two) times daily.    Marland Kitchen CINNAMON PO Take 1,000 mg by mouth 3 (three) times daily.     . clotrimazole-betamethasone (LOTRISONE) cream APPLY TO AFFECTED AREA TWICE A DAY FOR 2 WEEKS 45 g 3  . cyanocobalamin (,VITAMIN B-12,) 1000 MCG/ML injection INJECT 1/2 ML  EVERY MONTH 10 mL 2  . enalapril (VASOTEC) 20 MG tablet TAKE 2 TABLETS BY MOUTH EVERY DAY 180 tablet 0  . famotidine (PEPCID) 20 MG tablet Take 1 tablet 2 x /day with meals for Acid Indigestion 180 tablet 3  . glipiZIDE (GLUCOTROL) 10 MG tablet Take 1 tab daily with each meal; take 2 tabs with largest meal of the day for total of 40 mg daily. 360 tablet 1  . glucose blood (ONE TOUCH ULTRA TEST) test strip CHECK BLOOD SUGAR ONCE DAILY 100 each 1  . metFORMIN (GLUCOPHAGE-XR) 500 MG 24 hr tablet TAKE 2 TABLETS BY MOUTH TWICE A DAY WITH A MEAL 360 tablet 1  . metoprolol succinate (TOPROL-XL) 50 MG 24 hr tablet TAKE 1 TABLET (50 MG TOTAL) BY MOUTH DAILY. TAKE WITH OR IMMEDIATELY FOLLOWING A MEAL. 90 tablet 3  . simvastatin (ZOCOR) 40 MG tablet TAKE 1 TABLET BY MOUTH EVERY DAY 90 tablet 3  . terbinafine (LAMISIL) 250 MG tablet TAKE 1 TABLET BY MOUTH DAILY FOR 1 MONTH ON, 1 MONTH OFF UNTIL RUN OUT 90 tablet 0  . warfarin (COUMADIN) 5 MG tablet TAKE 1/2 TO 1 TABLET BY MOUTH DAILY OR AS DIRECTED 90 tablet  0   No current facility-administered medications on file prior to visit.    Past Medical History:  Diagnosis Date  . Anemia   . AS (aortic stenosis)    valve replacement-st. jude mechanical valve  . ASHD (arteriosclerotic heart disease)   . CAD (coronary artery disease), native coronary artery    CABG 07/18/2003  . Cancer [facility])    prostate  . GERD (gastroesophageal reflux disease)   . History of nuclear stress test 02/12/10   EF 67%, normal perfusion  . Hyperlipidemia   . Hypertension   . Morbid obesity ([place])   . Type II or unspecified type diabetes mellitus without mention of complication, not stated as uncontrolled   . Umbilical hernia   . Vitamin B12 deficiency   . Vitamin D deficiency    No Known Allergies   Family history- Reviewed and unchanged   Social history- Reviewed and unchanged   Patient Care Team: Unk Pinto, MD as PCP - General (Internal Medicine) Debara Pickett  Nadean Corwin, MD as Consulting Physician (Cardiology) Laurence Spates, MD as Consulting Physician (Gastroenterology)   Screening Tests: Immunization History  Administered Date(s) Administered  . Influenza Whole 09/01/2012  . Influenza, High Dose Seasonal PF 09/27/2015  . Influenza-Unspecified 08/25/2016, 08/24/2018  . Pneumococcal Conjugate-13 12/18/2015  . Pneumococcal Polysaccharide-23 09/01/2012  . Tdap 11/25/2007     Vaccinations: TD or Tdap: 2009  Influenza: DUE  Pneumococcal: 08/2012 Prevnar13: 11/2015 Shingles: Zostavax/Shingrix: Discussed with patient  Preventative Care: Last colonoscopy: Cologuard 11/2018  Imaging: Chest X-ray: 08/2015 EKG: 12/2018 ECHO: 12/2017 Myoview: 01/2015   Review of Systems:  Review of Systems  Constitutional: Negative for chills, diaphoresis, fever, malaise/fatigue and weight loss.  HENT: Negative for congestion, ear discharge, ear pain, hearing loss, nosebleeds, sinus pain, sore throat and tinnitus.   Eyes: Negative for blurred vision, double vision, photophobia, pain, discharge and redness.  Respiratory: Negative for cough, hemoptysis, sputum production, shortness of breath, wheezing and stridor.   Cardiovascular: Negative for chest pain, palpitations, orthopnea, claudication, leg swelling and PND.  Gastrointestinal: Negative for abdominal pain, blood in stool, constipation, diarrhea, heartburn, melena, nausea and vomiting.  Genitourinary: Negative for dysuria, flank pain, frequency, hematuria and urgency.  Musculoskeletal: Negative for back pain, falls, joint pain, myalgias and neck pain.  Skin: Negative for itching and rash.  Neurological: Negative for dizziness, tingling, tremors, sensory change, speech change, focal weakness, seizures, loss of consciousness, weakness and headaches.  Endo/Heme/Allergies: Negative for environmental allergies and polydipsia. Does not bruise/bleed easily.  Psychiatric/Behavioral: Negative for depression,  hallucinations, memory loss, substance abuse and suicidal ideas. The patient is not nervous/anxious and does not have insomnia.       Physical Exam: BP 130/70   Pulse 64   Temp 97.7 F (36.5 C)   Wt 219 lb (99.3 kg)   SpO2 95%   BMI 32.34 kg/m  Wt Readings from Last 3 Encounters:  09/13/19 219 lb (99.3 kg)  06/07/19 222 lb (100.7 kg)  02/22/19 218 lb (98.9 kg)   General Appearance: Well nourished, in no apparent distress. Eyes: PERRLA, EOMs, conjunctiva no swelling or erythema Sinuses: No Frontal/maxillary tenderness ENT/Mouth: Ext aud canals clear, TMs without erythema, bulging. No erythema, swelling, or exudate on post pharynx.  Tonsils not swollen or erythematous. Hearing normal.  Neck: Supple, thyroid normal.  Respiratory: Respiratory effort normal, BS equal bilaterally without rales, rhonchi, wheezing or stridor.  Cardio: RRR with no MRGs. Brisk peripheral pulses without edema.  Abdomen: Soft, + BS.  Non tender, no guarding,  rebound, hernias, masses. Lymphatics: Non tender without lymphadenopathy.  Musculoskeletal: Full ROM, 5/5 strength, Normal gait Skin: Warm, dry without rashes, lesions, ecchymosis.  Neuro: Cranial nerves intact. No cerebellar symptoms.  Psych: Awake and oriented X 3, normal affect, Insight and Judgment appropriate.    Garnet Sierras, NP [facility] 12:05 PM

## 2019-09-13 NOTE — Patient Instructions (Addendum)
You receive your influenza vaccination today.  You are due for a tetanus vaccination.  Should you cut yourself on any metyal pobject please let our office know so we can get you this vaccination.     .https://www.cdc.gov/vaccines/hcp/vis/vis-statements/tdap.pdf">  Tdap Vaccine (Tetanus, Diphtheria and Pertussis): What You Need to Know 1. Why get vaccinated? Tetanus, diphtheria and pertussis are very serious diseases. Tdap vaccine can protect [place] from these diseases. And, Tdap vaccine given to pregnant women can protect newborn babies against pertussis.Marland Kitchen TETANUS (Lockjaw) is rare in the [place] today. It causes painful muscle tightening and stiffness, usually all over the body.  It can lead to tightening of muscles in the head and neck so you can't open your mouth, swallow, or sometimes even breathe. Tetanus kills about 1 out of 10 people who are infected even after receiving the best medical care. DIPHTHERIA is also rare in the [place] today. It can cause a thick coating to form in the back of the throat.  It can lead to breathing problems, heart failure, paralysis, and death. PERTUSSIS (Whooping Cough) causes severe coughing spells, which can cause difficulty breathing, vomiting and disturbed sleep.  It can also lead to weight loss, incontinence, and rib fractures. Up to 2 in 100 adolescents and 5 in 100 adults with pertussis are hospitalized or have complications, which could include pneumonia or death. These diseases are caused by bacteria. Diphtheria and pertussis are spread from person to person through secretions from coughing or sneezing. Tetanus enters the body through cuts, scratches, or wounds. Before vaccines, as many as 200,000 cases of diphtheria, 200,000 cases of pertussis, and hundreds of cases of tetanus, were reported in the [place] each year. Since vaccination began, reports of cases for tetanus and diphtheria have dropped by about 99% and for pertussis by  about 80%. 2. Tdap vaccine Tdap vaccine can protect adolescents and adults from tetanus, diphtheria, and pertussis. One dose of Tdap is routinely given at age 8 or 24. People who did not get Tdap at that age should get it as soon as possible. Tdap is especially important for healthcare professionals and anyone having close contact with a baby younger than 12 months. Pregnant women should get a dose of Tdap during every pregnancy, to protect the newborn from pertussis. Infants are most at risk for severe, life-threatening complications from pertussis. Another vaccine, called Td, protects against tetanus and diphtheria, but not pertussis. A Td booster should be given every 10 years. Tdap may be given as one of these boosters if you have never gotten Tdap before. Tdap may also be given after a severe cut or burn to prevent tetanus infection. Your doctor or the person giving you the vaccine can give you more information. Tdap may safely be given at the same time as other vaccines. 3. Some people should not get this vaccine  A person who has ever had a life-threatening allergic reaction after a previous dose of any diphtheria, tetanus or pertussis containing vaccine, OR has a severe allergy to any part of this vaccine, should not get Tdap vaccine. Tell the person giving the vaccine about any severe allergies.  Anyone who had coma or long repeated seizures within 7 days after a childhood dose of DTP or DTaP, or a previous dose of Tdap, should not get Tdap, unless a cause other than the vaccine was found. They can still get Td.  Talk to your doctor if you: ? have seizures or another nervous system problem, ?  had severe pain or swelling after any vaccine containing diphtheria, tetanus or pertussis, ? ever had a condition called Guillain-Barr Syndrome (GBS), ? aren't feeling well on the day the shot is scheduled. 4. Risks With any medicine, including vaccines, there is a chance of side effects. These  are usually mild and go away on their own. Serious reactions are also possible but are rare. Most people who get Tdap vaccine do not have any problems with it. Mild problems following Tdap (Did not interfere with activities)  Pain where the shot was given (about 3 in 4 adolescents or 2 in 3 adults)  Redness or swelling where the shot was given (about 1 person in 5)  Mild fever of at least 100.32F (up to about 1 in 25 adolescents or 1 in 100 adults)  Headache (about 3 or 4 people in 10)  Tiredness (about 1 person in 3 or 4)  Nausea, vomiting, diarrhea, stomach ache (up to 1 in 4 adolescents or 1 in 10 adults)  Chills, sore joints (about 1 person in 10)  Body aches (about 1 person in 3 or 4)  Rash, swollen glands (uncommon) Moderate problems following Tdap (Interfered with activities, but did not require medical attention)  Pain where the shot was given (up to 1 in 5 or 6)  Redness or swelling where the shot was given (up to about 1 in 16 adolescents or 1 in 12 adults)  Fever over 102F (about 1 in 100 adolescents or 1 in 250 adults)  Headache (about 1 in 7 adolescents or 1 in 10 adults)  Nausea, vomiting, diarrhea, stomach ache (up to 1 or 3 people in 100)  Swelling of the entire arm where the shot was given (up to about 1 in 500). Severe problems following Tdap (Unable to perform usual activities; required medical attention)  Swelling, severe pain, bleeding and redness in the arm where the shot was given (rare). Problems that could happen after any vaccine:  People sometimes faint after a medical procedure, including vaccination. Sitting or lying down for about 15 minutes can help prevent fainting, and injuries caused by a fall. Tell your doctor if you feel dizzy, or have vision changes or ringing in the ears.  Some people get severe pain in the shoulder and have difficulty moving the arm where a shot was given. This happens very rarely.  Any medication can cause a  severe allergic reaction. Such reactions from a vaccine are very rare, estimated at fewer than 1 in a million doses, and would happen within a few minutes to a few hours after the vaccination. As with any medicine, there is a very remote chance of a vaccine causing a serious injury or death. The safety of vaccines is always being monitored. For more information, visit: http://www.aguilar.org/ 5. What if there is a serious problem? What should I look for?  Look for anything that concerns you, such as signs of a severe allergic reaction, very high fever, or unusual behavior. Signs of a severe allergic reaction can include hives, swelling of the face and throat, difficulty breathing, a fast heartbeat, dizziness, and weakness. These would usually start a few minutes to a few hours after the vaccination. What should I do?  If you think it is a severe allergic reaction or other emergency that can't wait, call 9-1-1 or get the person to the nearest hospital. Otherwise, call your doctor.  Afterward, the reaction should be reported to the Vaccine Adverse Event Reporting System (VAERS). Your doctor  might file this report, or you can do it yourself through the VAERS web site at www.vaers.SamedayNews.es, or by calling 971-760-0278. VAERS does not give medical advice. 6. The National Vaccine Injury Compensation Program The [employer] Vaccine Injury Compensation Program (VICP) is a federal program that was created to compensate people who may have been injured by certain vaccines. Persons who believe they may have been injured by a vaccine can learn about the program and about filing a claim by calling 320-782-7039 or visiting the [place] website at GoldCloset.com.ee. There is a time limit to file a claim for compensation. 7. How can I learn more?  Ask your doctor. He or she can give you the vaccine package insert or suggest other sources of information.  Call your local or state health  department.  Contact the Centers for Disease Control and Prevention (CDC): ? Call 615-491-9784 (1-800-CDC-INFO) or ? Visit CDC's website at http://hunter.com/ Vaccine Information Statement Tdap Vaccine (01/17/2014) This information is not intended to replace advice given to you by your health care provider. Make sure you discuss any questions you have with your health care provider. Document Released: 05/11/2012 Document Revised: 06/28/2018 Document Reviewed: 06/28/2018 Elsevier Interactive Patient Education  Purple Sage 1,000 mg   are recommended to help protect  against the Covid-19 and other Corona viruses.    Also it's recommended  to take  Zinc 50 mg  to help  protect against the Covid-19   and best place to get  is also on [employer].com  and don't pay more than 6-8 cents /pill !  ================================ Coronavirus (COVID-19) Are you at risk?  Are you at risk for the Coronavirus (COVID-19)?  To be considered HIGH RISK for Coronavirus (COVID-19), you have to meet the following criteria:  . Traveled to [place], [place], [place], [place] or [place]; or in the [place] to [place], [place], [place]  . or [place]; and have fever, cough, and shortness of breath within the last 2 weeks of travel OR . Been in close contact with a person diagnosed with COVID-19 within the last 2 weeks and have  . fever, cough,and shortness of breath .  . IF YOU DO NOT MEET THESE CRITERIA, YOU ARE CONSIDERED LOW RISK FOR COVID-19.  What to do if you are HIGH RISK for COVID-19?  Marland Kitchen If you are having a medical emergency, call 911. . Seek medical care right away. Before you go to a doctor's office, urgent care or emergency department, .  call ahead and tell them about your recent travel, contact with someone diagnosed with COVID-19  .  and your symptoms.  . You should receive instructions from your physician's office regarding next steps of care.   . When you arrive at healthcare provider, tell the healthcare staff immediately you have returned from  . visiting [place], [place], [place], [place] or [place]; or traveled in the [place] to [place], [place],  . [place] or [place] in the last two weeks or you have been in close contact with a person diagnosed with  . COVID-19 in the last 2 weeks.   . Tell the health care staff about your symptoms: fever, cough and shortness of breath. . After you have been seen by a medical provider, you will be either: o Tested for (COVID-19) and discharged home on quarantine except to seek medical care if  o symptoms worsen, and asked to  - Stay  home and avoid contact with others until you get your results (4-5 days)  - Avoid travel on public transportation if possible (such as bus, train, or airplane) or o Sent to the Emergency Department by EMS for evaluation, COVID-19 testing  and  o possible admission depending on your condition and test results.  What to do if you are LOW RISK for COVID-19?  Reduce your risk of any infection by using the same precautions used for avoiding the common cold or flu:  Marland Kitchen Wash your hands often with soap and warm water for at least 20 seconds.  If soap and water are not readily available,  . use an alcohol-based hand sanitizer with at least 60% alcohol.  . If coughing or sneezing, cover your mouth and nose by coughing or sneezing into the elbow areas of your shirt or coat, .  into a tissue or into your sleeve (not your hands). . Avoid shaking hands with others and consider head nods or verbal greetings only. . Avoid touching your eyes, nose, or mouth with unwashed hands.  . Avoid close contact with people who are sick. . Avoid places or events with large numbers of people in one location, like concerts or sporting events. . Carefully consider travel plans you have or are making. . If you are planning any travel outside or inside the [place], visit the CDC's  Travelers' Health webpage for the latest health notices. . If you have some symptoms but not all symptoms, continue to monitor at home and seek medical attention  . if your symptoms worsen. . If you are having a medical emergency, call 911. >>>>>>>>>>>>>>>>>>>>>>>>>>>>>>>>>>>>>>>>>>>>>>>>>>>>>>> We Do NOT Approve of  [facility] [place] Soliciting Our Patients  To Do Home Visits  & We Do NOT Approve of [employer] > > > > > > > > > > > > > > > > > > > > > > > > > > > > > > > > > > >  > > > >   Preventive Care for Adults  A healthy lifestyle and preventive care can promote health and wellness. Preventive health guidelines for men include the following key practices:  A routine yearly physical is a good way to check with your health care provider about your health and preventative screening. It is a chance to share any concerns and updates on your health and to receive a thorough exam.  Visit your dentist for a routine exam and preventative care every 6 months. Brush your teeth twice a day and floss once a day. Good oral hygiene prevents tooth decay and gum disease.  The frequency of eye exams is based on your age, health, family medical history, use of contact lenses, and other factors. Follow your health care provider's recommendations for frequency of eye exams.  Eat a healthy diet. Foods such as vegetables, fruits, whole grains, low-fat dairy products, and lean protein foods contain the nutrients you need without too many calories. Decrease your intake of foods high in solid fats, added sugars, and salt. Eat the right amount of calories for you. Get information about a proper diet from your health care provider, if necessary.  Regular physical exercise is one of the most important things you can do for your health. Most adults should get at least 150 minutes of moderate-intensity exercise (any activity that increases your heart rate and causes you to sweat) each week.  In addition, most adults need muscle-strengthening exercises on 2 or more days  a week.  Maintain a healthy weight. The body mass index (BMI) is a screening tool to identify possible weight problems. It provides an estimate of body fat based on height and weight. Your health care provider can find your BMI and can help you achieve or maintain a healthy weight. For adults 20 years and older:  A BMI below 18.5 is considered underweight.  A BMI of 18.5 to 24.9 is normal.  A BMI of 25 to 29.9 is considered overweight.  A BMI of 30 and above is considered obese.  Maintain normal blood lipids and cholesterol levels by exercising and minimizing your intake of saturated fat. Eat a balanced diet with plenty of fruit and vegetables. Blood tests for lipids and cholesterol should begin at age 42 and be repeated every 5 years. If your lipid or cholesterol levels are high, you are over 50, or you are at high risk for heart disease, you may need your cholesterol levels checked more frequently. Ongoing high lipid and cholesterol levels should be treated with medicines if diet and exercise are not working.  If you smoke, find out from your health care provider how to quit. If you do not use tobacco, do not start.  Lung cancer screening is recommended for adults aged 39-80 years who are at high risk for developing lung cancer because of a history of smoking. A yearly low-dose CT scan of the lungs is recommended for people who have at least a 30-pack-year history of smoking and are a current smoker or have quit within the past 15 years. A pack year of smoking is smoking an average of 1 pack of cigarettes a day for 1 year (for example: 1 pack a day for 30 years or 2 packs a day for 15 years). Yearly screening should continue until the smoker has stopped smoking for at least 15 years. Yearly screening should be stopped for people who develop a health problem that would prevent them from having lung cancer treatment.  If  you choose to drink alcohol, do not have more than 2 drinks per day. One drink is considered to be 12 ounces (355 mL) of beer, 5 ounces (148 mL) of wine, or 1.5 ounces (44 mL) of liquor.  Avoid use of street drugs. Do not share needles with anyone. Ask for help if you need support or instructions about stopping the use of drugs.  High blood pressure causes heart disease and increases the risk of stroke. Your blood pressure should be checked at least every 1-2 years. Ongoing high blood pressure should be treated with medicines, if weight loss and exercise are not effective.  If you are 54-35 years old, ask your health care provider if you should take aspirin to prevent heart disease.  Diabetes screening involves taking a blood sample to check your fasting blood sugar level. Testing should be considered at a younger age or be carried out more frequently if you are overweight and have at least 1 risk factor for diabetes.  Colorectal cancer can be detected and often prevented. Most routine colorectal cancer screening begins at the age of 13 and continues through age 12. However, your health care provider may recommend screening at an earlier age if you have risk factors for colon cancer. On a yearly basis, your health care provider may provide home test kits to check for hidden blood in the stool. Use of a small camera at the end of a tube to directly examine the colon (sigmoidoscopy or colonoscopy) can  detect the earliest forms of colorectal cancer. Talk to your health care provider about this at age 40, when routine screening begins. Direct exam of the colon should be repeated every 5-10 years through age 27, unless early forms of precancerous polyps or small growths are found.  Hepatitis C blood testing is recommended for all people born from [birth year] through 1965 and any individual with known risks for hepatitis C.  Screening for abdominal aortic aneurysm (AAA)  by ultrasound is recommended for people who  have history of high blood pressure or who are current or former smokers.  Healthy men should  receive prostate-specific antigen (PSA) blood tests as part of routine cancer screening. Talk with your health care provider about prostate cancer screening.  Testicular cancer screening is  recommended for adult males. Screening includes self-exam, a health care provider exam, and other screening tests. Consult with your health care provider about any symptoms you have or any concerns you have about testicular cancer.  Use sunscreen. Apply sunscreen liberally and repeatedly throughout the day. You should seek shade when your shadow is shorter than you. Protect yourself by wearing long sleeves, pants, a wide-brimmed hat, and sunglasses year round, whenever you are outdoors.  Once a month, do a whole-body skin exam, using a mirror to look at the skin on your back. Tell your health care provider about new moles, moles that have irregular borders, moles that are larger than a pencil eraser, or moles that have changed in shape or color.  Stay current with required vaccines (immunizations).  Influenza vaccine. All adults should be immunized every year.  Tetanus, diphtheria, and acellular pertussis (Td, Tdap) vaccine. An adult who has not previously received Tdap or who does not know his vaccine status should receive 1 dose of Tdap. This initial dose should be followed by tetanus and diphtheria toxoids (Td) booster doses every 10 years. Adults with an unknown or incomplete history of completing a 3-dose immunization series with Td-containing vaccines should begin or complete a primary immunization series including a Tdap dose. Adults should receive a Td booster every 10 years.  Zoster vaccine. One dose is recommended for adults aged 30 years or older unless certain conditions are present.    PREVNAR - Pneumococcal 13-valent conjugate (PCV13) vaccine. When indicated, a person who is uncertain of his  immunization history and has no record of immunization should receive the PCV13 vaccine. An adult aged 47 years or older who has certain medical conditions and has not been previously immunized should receive 1 dose of PCV13 vaccine. This PCV13 should be followed with a dose of pneumococcal polysaccharide (PPSV23) vaccine. The PPSV23 vaccine dose should be obtained 1 or more year(s)after the dose of PCV13 vaccine. An adult aged 60 years or older who has certain medical conditions and previously received 1 or more doses of PPSV23 vaccine should receive 1 dose of PCV13. The PCV13 vaccine dose should be obtained 1 or more years after the last PPSV23 vaccine dose.    PNEUMOVAX - Pneumococcal polysaccharide (PPSV23) vaccine. When PCV13 is also indicated, PCV13 should be obtained first. All adults aged 3 years and older should be immunized. An adult younger than age 74 years who has certain medical conditions should be immunized. Any person who resides in a nursing home or long-term care facility should be immunized. An adult smoker should be immunized. People with an immunocompromised condition and certain other conditions should receive both PCV13 and PPSV23 vaccines. People with human immunodeficiency virus (HIV) infection should  be immunized as soon as possible after diagnosis. Immunization during chemotherapy or radiation therapy should be avoided. Routine use of PPSV23 vaccine is not recommended for American Indians, [place]atives, or people younger than 65 years unless there are medical conditions that require PPSV23 vaccine. When indicated, people who have unknown immunization and have no record of immunization should receive PPSV23 vaccine. One-time revaccination 5 years after the first dose of PPSV23 is recommended for people aged 19-64 years who have chronic kidney failure, nephrotic syndrome, asplenia, or immunocompromised conditions. People who received 1-2 doses of PPSV23 before age 36 years should  receive another dose of PPSV23 vaccine at age 65 years or later if at least 5 years have passed since the previous dose. Doses of PPSV23 are not needed for people immunized with PPSV23 at or after age 30 years.    Hepatitis A vaccine. Adults who wish to be protected from this disease, have certain high-risk conditions, work with hepatitis A-infected animals, work in hepatitis A research labs, or travel to or work in countries with a high rate of hepatitis A should be immunized. Adults who were previously unvaccinated and who anticipate close contact with an international adoptee during the first 60 days after arrival in the [place] from a country with a high rate of hepatitis A should be immunized.    Hepatitis B vaccine. Adults should be immunized if they wish to be protected from this disease, have certain high-risk conditions, may be exposed to blood or other infectious body fluids, are household contacts or sex partners of hepatitis B positive people, are clients or workers in certain care facilities, or travel to or work in countries with a high rate of hepatitis B.   Preventive Service / Frequency   Ages 79 and over  Blood pressure check.  Lipid and cholesterol check.  Lung cancer screening. / Every year if you are aged 96-80 years and have a 30-pack-year history of smoking and currently smoke or have quit within the past 15 years. Yearly screening is stopped once you have quit smoking for at least 15 years or develop a health problem that would prevent you from having lung cancer treatment.  Fecal occult blood test (FOBT) of stool. You may not have to do this test if you get a colonoscopy every 10 years.  Flexible sigmoidoscopy** or colonoscopy.** / Every 5 years for a flexible sigmoidoscopy or every 10 years for a colonoscopy beginning at age 26 and continuing until age 73.  Hepatitis C blood test.** / For all people born from [birth year] through 1964 and any individual with known  risks for hepatitis C.  Abdominal aortic aneurysm (AAA) screening./ Screening current or former smokers or have Hypertension.  Skin self-exam. / Monthly.  Influenza vaccine. / Every year.  Tetanus, diphtheria, and acellular pertussis (Tdap/Td) vaccine.** / 1 dose of Td every 10 years.   Zoster vaccine.** / 1 dose for adults aged 14 years or older.         Pneumococcal 13-valent conjugate (PCV13) vaccine.    Pneumococcal polysaccharide (PPSV23) vaccine.     Hepatitis A vaccine.** / Consult your health care provider.  Hepatitis B vaccine.** / Consult your health care provider. Screening for abdominal aortic aneurysm (AAA)  by ultrasound is recommended for people who have history of high blood pressure or who are current or former smokers. ++++++++++ Recommend Adult Low Dose Aspirin or  coated  Aspirin 81 mg daily  To reduce risk of Colon Cancer 40 %,  Skin Cancer 26 % ,  Malignant Melanoma 46%  and  Pancreatic cancer 60% ++++++++++++++++++++++ Vitamin D goal  is between 70-100.  Please make sure that you are taking your Vitamin D as directed.  It is very important as a natural anti-inflammatory  helping hair, skin, and nails, as well as reducing stroke and heart attack risk.  It helps your bones and helps with mood. It also decreases numerous cancer risks so please take it as directed.  Low Vit D is associated with a 200-300% higher risk for CANCER  and 200-300% higher risk for HEART   ATTACK  &  STROKE.   .....................................Marland Kitchen It is also associated with higher death rate at younger ages,  autoimmune diseases like Rheumatoid arthritis, Lupus, Multiple Sclerosis.    Also many other serious conditions, like depression, Alzheimer's Dementia, infertility, muscle aches, fatigue, fibromyalgia - just to name a few. ++++++++++++++++++++++ Recommend the book "The END of DIETING" by Dr Excell Seltzer  & the book "The END of DIABETES " by Dr Excell Seltzer At  [email or web address] - get book & Audio CD's    Being diabetic has a  300% increased risk for heart attack, stroke, cancer, and alzheimer- type vascular dementia. It is very important that you work harder with diet by avoiding all foods that are white. Avoid white rice (brown & wild rice is OK), white potatoes (sweetpotatoes in moderation is OK), White bread or wheat bread or anything made out of white flour like bagels, donuts, rolls, buns, biscuits, cakes, pastries, cookies, pizza crust, and pasta (made from white flour & egg whites) - vegetarian pasta or spinach or wheat pasta is OK. Multigrain breads like Arnold's or Pepperidge Farm, or multigrain sandwich thins or flatbreads.  Diet, exercise and weight loss can reverse and cure diabetes in the early stages.  Diet, exercise and weight loss is very important in the control and prevention of complications of diabetes which affects every system in your body, ie. Brain - dementia/stroke, eyes - glaucoma/blindness, heart - heart attack/heart failure, kidneys - dialysis, stomach - gastric paralysis, intestines - malabsorption, nerves - severe painful neuritis, circulation - gangrene & loss of a leg(s), and finally cancer and Alzheimers.    I recommend avoid fried & greasy foods,  sweets/candy, white rice (brown or wild rice or Quinoa is OK), white potatoes (sweet potatoes are OK) - anything made from white flour - bagels, doughnuts, rolls, buns, biscuits,white and wheat breads, pizza crust and traditional pasta made of white flour & egg white(vegetarian pasta or spinach or wheat pasta is OK).  Multi-grain bread is OK - like multi-grain flat bread or sandwich thins. Avoid alcohol in excess. Exercise is also important.    Eat all the vegetables you want - avoid meat, especially red meat and dairy - especially cheese.  Cheese is the most concentrated form of trans-fats which is the worst thing to clog up our arteries. Veggie cheese is OK which can be found in the fresh  produce section at [employer] or [employer] or [employer]  ++++++++++++++++++++++ DASH Eating Plan  DASH stands for "Dietary Approaches to Stop Hypertension."   The DASH eating plan is a healthy eating plan that has been shown to reduce high blood pressure (hypertension). Additional health benefits may include reducing the risk of type 2 diabetes mellitus, heart disease, and stroke. The DASH eating plan may also help with weight loss. WHAT DO I NEED TO KNOW ABOUT THE DASH EATING PLAN? For the DASH eating plan, you  will follow these general guidelines:  Choose foods with a percent daily value for sodium of less than 5% (as listed on the food label).  Use salt-free seasonings or herbs instead of table salt or sea salt.  Check with your health care provider or pharmacist before using salt substitutes.  Eat lower-sodium products, often labeled as "lower sodium" or "no salt added."  Eat fresh foods.  Eat more vegetables, fruits, and low-fat dairy products.  Choose whole grains. Look for the word "whole" as the first word in the ingredient list.  Choose fish   Limit sweets, desserts, sugars, and sugary drinks.  Choose heart-healthy fats.  Eat veggie cheese   Eat more home-cooked food and less restaurant, buffet, and fast food.  Limit fried foods.  Cook foods using methods other than frying.  Limit canned vegetables. If you do use them, rinse them well to decrease the sodium.  When eating at a restaurant, ask that your food be prepared with less salt, or no salt if possible.                      WHAT FOODS CAN I EAT? Read Dr Fara Olden Fuhrman's books on The End of Dieting & The End of Diabetes  Grains Whole grain or whole wheat bread. Brown rice. Whole grain or whole wheat pasta. Quinoa, bulgur, and whole grain cereals. Low-sodium cereals. Corn or whole wheat flour tortillas. Whole grain cornbread. Whole grain crackers. Low-sodium crackers.  Vegetables Fresh or frozen  vegetables (raw, steamed, roasted, or grilled). Low-sodium or reduced-sodium tomato and vegetable juices. Low-sodium or reduced-sodium tomato sauce and paste. Low-sodium or reduced-sodium canned vegetables.   Fruits All fresh, canned (in natural juice), or frozen fruits.  Protein Products  All fish and seafood.  Dried beans, peas, or lentils. Unsalted nuts and seeds. Unsalted canned beans.  Dairy Low-fat dairy products, such as skim or 1% milk, 2% or reduced-fat cheeses, low-fat ricotta or cottage cheese, or plain low-fat yogurt. Low-sodium or reduced-sodium cheeses.  Fats and Oils Tub margarines without trans fats. Light or reduced-fat mayonnaise and salad dressings (reduced sodium). Avocado. Safflower, olive, or canola oils. Natural peanut or almond butter.  Other Unsalted popcorn and pretzels. The items listed above may not be a complete list of recommended foods or beverages. Contact your dietitian for more options.  ++++++++++++++++++++  WHAT FOODS ARE NOT RECOMMENDED? Grains/ White flour or wheat flour White bread. White pasta. White rice. Refined cornbread. Bagels and croissants. Crackers that contain trans fat.  Vegetables  Creamed or fried vegetables. Vegetables in a . Regular canned vegetables. Regular canned tomato sauce and paste. Regular tomato and vegetable juices.  Fruits Dried fruits. Canned fruit in light or heavy syrup. Fruit juice.  Meat and Other Protein Products Meat in general - RED meat & White meat.  Fatty cuts of meat. Ribs, chicken wings, all processed meats as bacon, sausage, bologna, salami, fatback, hot dogs, bratwurst and packaged luncheon meats.  Dairy Whole or 2% milk, cream, half-and-half, and cream cheese. Whole-fat or sweetened yogurt. Full-fat cheeses or blue cheese. Non-dairy creamers and whipped toppings. Processed cheese, cheese spreads, or cheese curds.  Condiments Onion and garlic salt, seasoned salt, table salt, and sea salt. Canned and  packaged gravies. Worcestershire sauce. Tartar sauce. Barbecue sauce. Teriyaki sauce. Soy sauce, including reduced sodium. Steak sauce. Fish sauce. Oyster sauce. Cocktail sauce. Horseradish. Ketchup and mustard. Meat flavorings and tenderizers. Bouillon cubes. Hot sauce. Tabasco sauce. Marinades. Taco seasonings. Relishes.  Fats  and Oils Butter, stick margarine, lard, shortening and bacon fat. Coconut, palm kernel, or palm oils. Regular salad dressings.  Pickles and olives. Salted popcorn and pretzels.  The items listed above may not be a complete list of foods and beverages to avoid.

## 2019-09-14 LAB — LIPID PANEL
Cholesterol: 115 mg/dL
HDL: 35 mg/dL — ABNORMAL LOW
LDL Cholesterol (Calc): 63 mg/dL
Non-HDL Cholesterol (Calc): 80 mg/dL
Total CHOL/HDL Ratio: 3.3 (calc)
Triglycerides: 91 mg/dL

## 2019-09-14 LAB — VITAMIN D 25 HYDROXY (VIT D DEFICIENCY, FRACTURES): Vit D, 25-Hydroxy: 25 ng/mL — ABNORMAL LOW (ref 30–100)

## 2019-09-14 LAB — CBC WITH DIFFERENTIAL/PLATELET
Absolute Monocytes: 439 {cells}/uL (ref 200–950)
Basophils Absolute: 31 {cells}/uL (ref 0–200)
Basophils Relative: 0.6 %
Eosinophils Absolute: 107 {cells}/uL (ref 15–500)
Eosinophils Relative: 2.1 %
HCT: 39.7 % (ref 38.5–50.0)
Hemoglobin: 13.5 g/dL (ref 13.2–17.1)
Lymphs Abs: 995 {cells}/uL (ref 850–3900)
MCH: 31.3 pg (ref 27.0–33.0)
MCHC: 34 g/dL (ref 32.0–36.0)
MCV: 91.9 fL (ref 80.0–100.0)
MPV: 11.9 fL (ref 7.5–12.5)
Monocytes Relative: 8.6 %
Neutro Abs: 3529 {cells}/uL (ref 1500–7800)
Neutrophils Relative %: 69.2 %
Platelets: 150 10*3/uL (ref 140–400)
RBC: 4.32 Million/uL (ref 4.20–5.80)
RDW: 12.7 % (ref 11.0–15.0)
Total Lymphocyte: 19.5 %
WBC: 5.1 10*3/uL (ref 3.8–10.8)

## 2019-09-14 LAB — COMPLETE METABOLIC PANEL WITHOUT GFR
AG Ratio: 1.9 (calc) (ref 1.0–2.5)
ALT: 13 U/L (ref 9–46)
AST: 13 U/L (ref 10–35)
Albumin: 3.9 g/dL (ref 3.6–5.1)
Alkaline phosphatase (APISO): 67 U/L (ref 35–144)
BUN/Creatinine Ratio: 16 (calc) (ref 6–22)
BUN: 10 mg/dL (ref 7–25)
CO2: 24 mmol/L (ref 20–32)
Calcium: 8.5 mg/dL — ABNORMAL LOW (ref 8.6–10.3)
Chloride: 105 mmol/L (ref 98–110)
Creat: 0.62 mg/dL — ABNORMAL LOW (ref 0.70–1.18)
GFR, Est African American: 111 mL/min/{1.73_m2}
GFR, Est Non African American: 96 mL/min/{1.73_m2}
Globulin: 2.1 g/dL (ref 1.9–3.7)
Glucose, Bld: 132 mg/dL — ABNORMAL HIGH (ref 65–99)
Potassium: 4.3 mmol/L (ref 3.5–5.3)
Sodium: 138 mmol/L (ref 135–146)
Total Bilirubin: 0.9 mg/dL (ref 0.2–1.2)
Total Protein: 6 g/dL — ABNORMAL LOW (ref 6.1–8.1)

## 2019-09-14 LAB — PROTIME-INR
INR: 3.4 — ABNORMAL HIGH
Prothrombin Time: 32.9 s — ABNORMAL HIGH (ref 9.0–11.5)

## 2019-09-14 LAB — HEMOGLOBIN A1C
Hgb A1c MFr Bld: 9 %{Hb} — ABNORMAL HIGH
Mean Plasma Glucose: 212 (calc)
eAG (mmol/L): 11.7 (calc)

## 2019-09-14 LAB — MAGNESIUM: Magnesium: 1.9 mg/dL (ref 1.5–2.5)

## 2019-09-15 MED ORDER — CHOLECALCIFEROL 1.25 MG (50000 UT) PO CAPS: ORAL_CAPSULE | ORAL | 0 refills | Status: DC

## 2019-09-20 ENCOUNTER — Other Ambulatory Visit: Payer: Self-pay

## 2019-09-20 ENCOUNTER — Ambulatory Visit (INDEPENDENT_AMBULATORY_CARE_PROVIDER_SITE_OTHER): Payer: PPO | Admitting: Pharmacist Clinician (PhC)/ Clinical Pharmacy Specialist

## 2019-09-20 DIAGNOSIS — Z952 Presence of prosthetic heart valve: Secondary | ICD-10-CM

## 2019-09-20 DIAGNOSIS — Z7901 Long term (current) use of anticoagulants: Secondary | ICD-10-CM | POA: Diagnosis not present

## 2019-09-20 LAB — POCT INR: INR: 3 (ref 2.0–3.0)

## 2019-10-18 ENCOUNTER — Other Ambulatory Visit: Payer: Self-pay

## 2019-10-18 ENCOUNTER — Ambulatory Visit (INDEPENDENT_AMBULATORY_CARE_PROVIDER_SITE_OTHER): Payer: PPO | Admitting: Pharmacist

## 2019-10-18 DIAGNOSIS — Z7901 Long term (current) use of anticoagulants: Secondary | ICD-10-CM

## 2019-10-18 DIAGNOSIS — Z952 Presence of prosthetic heart valve: Secondary | ICD-10-CM | POA: Diagnosis not present

## 2019-10-18 LAB — POCT INR: INR: 3.3 — AB (ref 2.0–3.0)

## 2019-11-08 ENCOUNTER — Other Ambulatory Visit: Payer: Self-pay | Admitting: Internal Medicine

## 2019-11-08 DIAGNOSIS — K219 Gastro-esophageal reflux disease without esophagitis: Secondary | ICD-10-CM

## 2019-11-15 ENCOUNTER — Ambulatory Visit (INDEPENDENT_AMBULATORY_CARE_PROVIDER_SITE_OTHER): Payer: PPO | Admitting: Pharmacist Clinician (PhC)/ Clinical Pharmacy Specialist

## 2019-11-15 ENCOUNTER — Encounter (INDEPENDENT_AMBULATORY_CARE_PROVIDER_SITE_OTHER): Payer: Self-pay

## 2019-11-15 ENCOUNTER — Other Ambulatory Visit: Payer: Self-pay

## 2019-11-15 DIAGNOSIS — Z952 Presence of prosthetic heart valve: Secondary | ICD-10-CM

## 2019-11-15 DIAGNOSIS — Z7901 Long term (current) use of anticoagulants: Secondary | ICD-10-CM | POA: Diagnosis not present

## 2019-11-15 LAB — HM DIABETES EYE EXAM

## 2019-11-15 LAB — POCT INR: INR: 2.4 (ref 2.0–3.0)

## 2019-11-29 ENCOUNTER — Other Ambulatory Visit: Payer: Self-pay | Admitting: Internal Medicine

## 2019-12-01 ENCOUNTER — Encounter: Payer: Self-pay | Admitting: Internal Medicine

## 2019-12-05 ENCOUNTER — Other Ambulatory Visit: Payer: Self-pay

## 2019-12-05 ENCOUNTER — Ambulatory Visit (INDEPENDENT_AMBULATORY_CARE_PROVIDER_SITE_OTHER): Payer: PPO | Admitting: Pharmacist

## 2019-12-05 ENCOUNTER — Ambulatory Visit: Payer: PPO | Admitting: Internal Medicine

## 2019-12-05 ENCOUNTER — Encounter: Payer: Self-pay | Admitting: Internal Medicine

## 2019-12-05 VITALS — BP 130/72 | HR 81 | Temp 97.7°F | Ht 69.0 in | Wt 215.0 lb

## 2019-12-05 DIAGNOSIS — Z951 Presence of aortocoronary bypass graft: Secondary | ICD-10-CM

## 2019-12-05 DIAGNOSIS — Z952 Presence of prosthetic heart valve: Secondary | ICD-10-CM

## 2019-12-05 DIAGNOSIS — I4892 Unspecified atrial flutter: Secondary | ICD-10-CM

## 2019-12-05 DIAGNOSIS — Z7901 Long term (current) use of anticoagulants: Secondary | ICD-10-CM

## 2019-12-05 LAB — POCT INR: INR: 1.8 — AB (ref 2.0–3.0)

## 2019-12-05 NOTE — Patient Instructions (Addendum)
Medication Instructions:  Your physician recommends that you continue on your current medications as directed. Please refer to the Current Medication list given to you today.  *If you need a refill on your cardiac medications before your next appointment, please call your pharmacy*  Lab Work: NONE  Testing/Procedures: Your physician has requested that you have an echocardiogram. Echocardiography is a painless test that uses sound waves to create images of your heart. It provides your doctor with information about the size and shape of your heart and how well your heart's chambers and valves are working. This procedure takes approximately one hour. There are no restrictions for this procedure. -- this is done at [address] - [address]  Follow-Up: At [facility], you and your health needs are our priority.  As part of our continuing mission to provide you with exceptional heart care, we have created designated Provider Care Teams.  These Care Teams include your primary Cardiologist (physician) and Advanced Practice Providers (APPs -  Physician Assistants and Nurse Practitioners) who all work together to provide you with the care you need, when you need it.  Your next appointment:   3 months  The format for your next appointment:   In Person  Provider:   You may see Dr. Debara Pickett or one of the following Advanced Practice Providers on your designated Care Team:    Almyra Deforest, PA-C  Fabian Sharp, [place] or   Roby Lofts, [place]   Other Instructions

## 2019-12-05 NOTE — Progress Notes (Signed)
OFFICE NOTE  Chief Complaint:  No complaints  Primary Care Physician: Unk Pinto, MD  HPI:  Billy Reilly is a 79 year old gentleman, previously followed by Dr. Rex Kras, with a history of coronary disease. In 2004, he had CABG and replacement of his aortic valve with a mechanical prosthesis. He had 2-vessel bypass at the time with saphenous vein graft to the first diagonal and saphenous vein graft to the posterior descending by Dr. Prescott Gum, and replacement of the aortic valve with a 22 mm ATS AP valve. He has done well with valve replacement to this point without any recurrent angina, shortness of breath, palpitations, presyncope, or syncopal symptoms. INR checked today in the office is 2.6 and it has been stable on Coumadin without dose adjustments for at least a year. 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 Recently he was started on Farxiga, which is resulted in excellent weight loss as well as control of his blood sugars.  Overall he feels quite well.  I saw Billy Reilly back today in the office. He is here for DOT visit. When I last saw him he was doing well denies any chest pain or shortness of breath. That holds true today. His warfarin levels have been well controlled his INR was 3.1 today. He has not had stress testing in over 5 years and his bypass was in 2004. 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  Billy Reilly returns today for follow-up. I last saw him 6 months ago for DOT physical. At that time we performed a nuclear stress test which was negative for ischemia. 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 He denies any chest pain or worsening shortness of breath. Blood pressures been well controlled.  I saw Billy Reilly back today in the office for follow-up. He returns for a DOT letter. He continues to do well after bypass surgery  mechanical valve replacement. His INRs have been therapeutic. He is completely asymptomatic. 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 He had a stress test last year which was negative for ischemia. He is due for repeat echocardiogram to reassess his valve gradients this year.  01/20/2017  Billy Reilly was seen today in follow-up. He is due for renewal of his DOT physical. From a cardiac standpoint he seems stable. Denies a chest pain or worsening shortness of breath. An echocardiogram last year which showed a stable aortic valve gradient. 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 He is on warfarin and INR today was 2.8. EKG shows sinus rhythm.  12/23/2017  Billy Reilly was seen today for routine follow-up.  He continues to work and requires a renewal of his DOT letter.  He has an upcoming echo at the end of February to assess the stability of his valve.  He denies any chest pain worsening shortness of breath.  His INR was a slightly low he needs some adjustment.  EKG is unchanged.  01/10/2019  Billy Reilly returns today for follow-up.  Overall he continues to do well.  He still working for [employer].  He drives oil trucks.  He continues to get annual DOT physicals.  He has an upcoming physical.  His echo last year showed a stable LVEF with normally functioning mechanical aortic valve.  His INR was  therapeutic today on warfarin.  He denies any worsening shortness of breath.  He has had no further chest pain.  He is now more than 15 years out from his bypass surgery.  I did review lab work from December 2019 which showed total cholesterol 146, HDL 40, LDL 89 and triglycerides 81.  His hemoglobin A1c was 8.4.  We discussed diet as well does sound like he has somewhat of increased saturated fats in his diet which could be further optimized.  12/05/2019  Billy Reilly is seen today in follow-up.  Overall he remains asymptomatic.  Denies any new or  worsening chest pain or shortness of breath.  Recently saw his primary care provider and needs clearance for DOT physical.  Today he was noted incidentally to have atrial flutter with borderline ventricular response on his EKG.  Review of prior EKGs did not demonstrate this.  Fortunately he is anticoagulated on warfarin for his mechanical aortic valve.  He has had therapeutic INRs with this and is due for an INR today.  PMHx:  Past Medical History:  Diagnosis Date  . Anemia   . AS (aortic stenosis)    valve replacement-st. jude mechanical valve  . ASHD (arteriosclerotic heart disease)   . CAD (coronary artery disease), native coronary artery    CABG 07/18/2003  . Cancer [facility])    prostate  . GERD (gastroesophageal reflux disease)   . History of nuclear stress test 02/12/10   EF 67%, normal perfusion  . Hyperlipidemia   . Hypertension   . Morbid obesity ([place])   . Type II or unspecified type diabetes mellitus without mention of complication, not stated as uncontrolled   . Umbilical hernia   . Vitamin B12 deficiency   . Vitamin D deficiency     Past Surgical History:  Procedure Laterality Date  . AORTIC VALVE REPLACEMENT  07/18/2003   72mm ATS-AP valve  . AORTIC VALVE REPLACEMENT (AVR)/CORONARY ARTERY BYPASS GRAFTING (CABG)  07/18/2003   AVR; bypass x 2-SVG to first diag, SVG to posterior descending  . CARDIAC CATHETERIZATION  07/13/2003   AS-aortic valve gradient 59mmhg, valve area 1.1; obstruction of ostium of posterior lateral branch with obstruction in the ostium of first diagnonal  . HERNIA REPAIR  53/66/4403   RIH, umbilical  . NM MYOCAR PERF WALL MOTION  02/12/2010   protocol:Bruce, EF 67%, exercise cap 7 METS, low risk scan  . TEE WITHOUT CARDIOVERSION  07/18/2003   no evidence of PE, Aortic valve heavily calcified,   . TRANSTHORACIC ECHOCARDIOGRAM  02/21/2013   EF60-65%, Septal motion showed paradox. atrium moderately dilated    FAMHx:  Family History  Problem Relation  Age of Onset  . Stroke Mother   . Heart attack Father   . Heart disease Father   . Cancer Brother   . Cancer Sister     SOCHx:   reports that he has never smoked. He has never used smokeless tobacco. He reports that he does not drink alcohol or use drugs.  ALLERGIES:  No Known Allergies  ROS: Pertinent items noted in HPI and remainder of comprehensive ROS otherwise negative.  HOME MEDS: Current Outpatient Medications  Medication Sig Dispense Refill  . aspirin 81 MG tablet Take 81 mg by mouth daily.      . Cholecalciferol 1.25 MG (50000 UT) capsule Take one tablet by mouth three days a week for twelve weeks. 36 capsule 0  . CHOLECALCIFEROL PO Take 1,000 Units by mouth 2 (two) times daily.    Marland Kitchen  CINNAMON PO Take 1,000 mg by mouth 3 (three) times daily.     . clotrimazole-betamethasone (LOTRISONE) cream APPLY TO AFFECTED AREA TWICE A DAY FOR 2 WEEKS 45 g 3  . cyanocobalamin (,VITAMIN B-12,) 1000 MCG/ML injection INJECT 1/2 ML EVERY MONTH 10 mL 2  . enalapril (VASOTEC) 20 MG tablet TAKE 2 TABLETS BY MOUTH EVERY DAY 180 tablet 0  . famotidine (PEPCID) 20 MG tablet TAKE 1 TABLET 2 X /DAY WITH MEALS FOR ACID INDIGESTION 180 tablet 3  . glipiZIDE (GLUCOTROL) 10 MG tablet Take 1 tab daily with each meal; take 2 tabs with largest meal of the day for total of 40 mg daily. 360 tablet 1  . glucose blood (ONE TOUCH ULTRA TEST) test strip CHECK BLOOD SUGAR ONCE DAILY 100 each 1  . metFORMIN (GLUCOPHAGE-XR) 500 MG 24 hr tablet TAKE 2 TABLETS BY MOUTH TWICE A DAY WITH A MEAL 360 tablet 1  . metoprolol succinate (TOPROL-XL) 50 MG 24 hr tablet TAKE 1 TABLET (50 MG TOTAL) BY MOUTH DAILY. TAKE WITH OR IMMEDIATELY FOLLOWING A MEAL. 90 tablet 3  . simvastatin (ZOCOR) 40 MG tablet TAKE 1 TABLET BY MOUTH EVERY DAY 90 tablet 3  . terbinafine (LAMISIL) 250 MG tablet TAKE 1 TABLET BY MOUTH DAILY FOR 1 MONTH ON, 1 MONTH OFF UNTIL RUN OUT 90 tablet 0  . warfarin (COUMADIN) 5 MG tablet TAKE 1/2 TO 1 TABLET BY  MOUTH DAILY OR AS DIRECTED 90 tablet 0   No current facility-administered medications for this visit.    LABS/IMAGING: No results found for this or any previous visit (from the past 48 hour(s)). No results found.  VITALS: BP 130/72 (BP Location: Left Arm, Patient Position: Sitting, Cuff Size: Normal)   Pulse 81   Temp 97.7 F (36.5 C)   Ht 5\' 9"  (1.753 m)   Wt 215 lb (97.5 kg)   BMI 31.75 kg/m   EXAM: General appearance: alert and no distress Neck: no carotid bruit, no JVD and thyroid not enlarged, symmetric, no tenderness/mass/nodules Lungs: clear to auscultation bilaterally Heart: regular rate and rhythm, S1, S2 normal, no murmur, click, rub or gallop and Mechanical valve sounds are heard Abdomen: soft, non-tender; bowel sounds normal; no masses,  no organomegaly Extremities: extremities normal, atraumatic, no cyanosis or edema Pulses: 2+ and symmetric Skin: Skin color, texture, turgor normal. No rashes or lesions Neurologic: Grossly normal Psych: Mood, affect normal  EKG: Atrial flutter with variable AV block at 81-personally reviewed  ASSESSMENT: 1. New onset atrial flutter 2. CAD status post 2 vessel CABG (SVG to diagonal SVG to PDA) in 2004 3. Status post ATS mechanical aortic valve 4. Lifetime anticoagulation on warfarin 5. HTN 6. Dyslipidemia 7. DM2 8. Encounter for DOT physical  PLAN: 1.   Billy Reilly was noted to have new onset atrial flutter today.  He last saw his primary in October and there was no mention at the time that he may have been out of rhythm.  He seems to be asymptomatic with this and is anticoagulated however he is best cardiac function would be to be in sinus rhythm.  I think it is worthwhile to try to get him back into sinus.  We will plan to recheck his INR today and if he remains therapeutic try to arrange for an electrical cardioversion next week.  If successful then will repeat an echo to reassess his aortic valve as his last echo was in  2019.  [name], MD, [facility], FACP  [place] Director of the [facility] Lipid [facility] &  Cardiovascular Risk Reduction Clinic Diplomate of the American Board of Clinical Lipidology Attending Cardiologist  Direct Dial: [phone]  Fax: [phone]  Website:  [email or web address] [email or web address] Gunnels 12/05/2019, 1:27 PM

## 2019-12-07 ENCOUNTER — Other Ambulatory Visit: Payer: Self-pay | Admitting: Internal Medicine

## 2019-12-09 ENCOUNTER — Other Ambulatory Visit (HOSPITAL_COMMUNITY): Payer: PPO

## 2019-12-13 ENCOUNTER — Ambulatory Visit (HOSPITAL_COMMUNITY): Admit: 2019-12-13 | Payer: PPO | Admitting: Internal Medicine

## 2019-12-13 ENCOUNTER — Encounter (HOSPITAL_COMMUNITY): Payer: PPO

## 2019-12-13 SURGERY — CARDIOVERSION
Anesthesia: General

## 2019-12-19 NOTE — Progress Notes (Signed)
Annual  Screening/Preventative Visit  & Comprehensive Evaluation & Examination     This very nice 79 y.o. MWM  presents for a Screening /Preventative Visit & comprehensive evaluation and management of multiple medical co-morbidities.  Patient has been followed for HTN, HLD, T2_NIDDM  Prediabetes and Vitamin D Deficiency.     HTN predates since 63. Patient's BP has been controlled at home. hx of CABG & St Jude Mech AoVR in 2004 and is followed at the [facility].   Patient had a negative Myoview in 2016 by Dr Debara Pickett. Today's BP is at goal - 130/80.  Recently, he was found in Atrial Flutter by Dr Debara Pickett.  Patient denies any cardiac symptoms as chest pain, palpitations, shortness of breath, dizziness or ankle swelling.     Patient's hyperlipidemia is controlled with diet and medications. Patient denies myalgias or other medication SE's. 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  Patient denies reactive hypoglycemic symptoms, visual blurring, diabetic polys or paresthesias. Last A1c was not at goal:  Lab Results  Component Value Date   HGBA1C 9.0 (H) 09/13/2019        Finally, patient has history of Vitamin D Deficiency ("28" / 2012)  and last vitamin D was very low:  Lab Results  Component Value Date   VD25OH 25 (L) 09/13/2019    Current Outpatient Medications on File Prior to Visit  Medication Sig  . aspirin 81 MG tablet Take 81 mg by mouth daily.    . CHOLECALCIFEROL PO Take 1,000 Units by mouth 2 (two) times daily.  Marland Kitchen CINNAMON PO Take 1,000 mg by mouth 3 (three) times daily.   . clotrimazole-betamethasone (LOTRISONE) cream APPLY TO AFFECTED AREA TWICE A DAY FOR 2 WEEKS  . cyanocobalamin (,VITAMIN B-12,) 1000 MCG/ML injection INJECT 1/2 ML EVERY  MONTH  . enalapril (VASOTEC) 20 MG tablet TAKE 2 TABLETS BY MOUTH EVERY DAY  . famotidine (PEPCID) 20 MG tablet TAKE 1 TABLET 2 X /DAY WITH MEALS FOR ACID INDIGESTION  . glipiZIDE (GLUCOTROL) 10 MG tablet Take 1 tablet 3 x /day before Meals for Diabetes (Patient taking differently: Take 1 tablet at breakfast, 2 tablets at lunch and 1 tablet at supper.)  . glucose blood (ONE TOUCH ULTRA TEST) test strip CHECK BLOOD SUGAR ONCE DAILY  . metFORMIN (GLUCOPHAGE-XR) 500 MG 24 hr tablet TAKE 2 TABLETS BY MOUTH TWICE A DAY WITH A MEAL  . metoprolol succinate (TOPROL-XL) 50 MG 24 hr tablet TAKE 1 TABLET (50 MG TOTAL) BY MOUTH DAILY. TAKE WITH OR IMMEDIATELY FOLLOWING A MEAL.  . simvastatin (ZOCOR) 40 MG tablet TAKE 1 TABLET BY MOUTH EVERY DAY  . warfarin (COUMADIN) 5 MG tablet TAKE 1/2 TO 1 TABLET BY MOUTH DAILY OR AS DIRECTED  . terbinafine (LAMISIL) 250 MG tablet TAKE 1 TABLET BY MOUTH DAILY FOR 1 MONTH ON, 1 MONTH OFF UNTIL RUN OUT (Patient not taking: Reported on 12/20/2019)   No current facility-administered medications on file prior to visit.   No Known Allergies Past Medical History:  Diagnosis Date  . Anemia   . AS (aortic stenosis)    valve replacement-st. jude mechanical valve  . ASHD (arteriosclerotic heart disease)   . CAD (coronary  artery disease), native coronary artery    CABG 07/18/2003  . Cancer [facility])    prostate  . GERD (gastroesophageal reflux disease)   . History of nuclear stress test 02/12/10   EF 67%, normal perfusion  . Hyperlipidemia   . Hypertension   . Morbid obesity ([place])   . Type II or unspecified type diabetes mellitus without mention of complication, not stated as uncontrolled   . Umbilical hernia   . Vitamin B12 deficiency   . Vitamin D deficiency    Health Maintenance  Topic Date Due  . TETANUS/TDAP  02/22/2020 (Originally 11/24/2017)  . HEMOGLOBIN A1C  03/13/2020  . OPHTHALMOLOGY EXAM  11/14/2020  . FOOT EXAM  12/19/2020  . INFLUENZA VACCINE  Completed  .  PNA vac Low Risk Adult  Completed   Immunization History  Administered Date(s) Administered  . Influenza Whole 09/01/2012  . Influenza, High Dose Seasonal PF 09/27/2015  . Influenza-Unspecified 08/25/2016, 08/24/2018, 09/13/2019  . Pneumococcal Conjugate-13 12/18/2015  . Pneumococcal Polysaccharide-23 09/01/2012  . Tdap 11/25/2007   Cologard - 11/30/2018 - Negative - Recc f/u 3 years due Jan 2023.  Past Surgical History:  Procedure Laterality Date  . AORTIC VALVE REPLACEMENT  07/18/2003   4mm ATS-AP valve  . AORTIC VALVE REPLACEMENT (AVR)/CORONARY ARTERY BYPASS GRAFTING (CABG)  07/18/2003   AVR; bypass x 2-SVG to first diag, SVG to posterior descending  . CARDIAC CATHETERIZATION  07/13/2003   AS-aortic valve gradient 65mmhg, valve area 1.1; obstruction of ostium of posterior lateral branch with obstruction in the ostium of first diagnonal  . HERNIA REPAIR  17/79/3903   RIH, umbilical  . NM MYOCAR PERF WALL MOTION  02/12/2010   protocol:Bruce, EF 67%, exercise cap 7 METS, low risk scan  . TEE WITHOUT CARDIOVERSION  07/18/2003   no evidence of PE, Aortic valve heavily calcified,   . TRANSTHORACIC ECHOCARDIOGRAM  02/21/2013   EF60-65%, Septal motion showed paradox. atrium moderately dilated   Family History  Problem Relation Age of Onset  . Stroke Mother   . Heart attack Father   . Heart disease Father   . Cancer Brother   . Cancer Sister    Social History   Socioeconomic History  . Marital status: Married    Spouse name: Beverlee Nims  . Number of children: None  Occupational History  . Commercial truck driver   Tobacco Use  . Smoking status: Never Smoker  . Smokeless tobacco: Never Used  Substance and Sexual Activity  . Alcohol use: No  . Drug use: No  . Sexual activity: Not on file    ROS Constitutional: Denies fever, chills, weight loss/gain, headaches, insomnia,  night sweats or change in appetite. Does c/o fatigue. Eyes: Denies redness, blurred vision, diplopia,  discharge, itchy or watery eyes.  ENT: Denies discharge, congestion, post nasal drip, epistaxis, sore throat, earache, hearing loss, dental pain, Tinnitus, Vertigo, Sinus pain or snoring.  Cardio: Denies chest pain, palpitations, irregular heartbeat, syncope, dyspnea, diaphoresis, orthopnea, PND, claudication or edema Respiratory: denies cough, dyspnea, DOE, pleurisy, hoarseness, laryngitis or wheezing.  Gastrointestinal: Denies dysphagia, heartburn, reflux, water brash, pain, cramps, nausea, vomiting, bloating, diarrhea, constipation, hematemesis, melena, hematochezia, jaundice or hemorrhoids Genitourinary: Denies dysuria, frequency,  discharge, hematuria or flank pain. Has urgency, nocturia x 2-3 & occasional hesitancy. Musculoskeletal: Denies arthralgia, myalgia, stiffness, Jt. Swelling, pain, limp or strain/sprain. Denies Falls. Skin: Denies puritis, rash, hives, warts, acne, eczema or change in skin lesion Neuro: No weakness, tremor, incoordination, spasms, paresthesia or pain Psychiatric: Denies confusion,  memory loss or sensory loss. Denies Depression. Endocrine: Denies change in weight, skin, hair change, nocturia, and paresthesia, diabetic polys, visual blurring or hyper / hypo glycemic episodes.  Heme/Lymph: No excessive bleeding, bruising or enlarged lymph nodes.  Physical Exam  BP 130/80   Pulse 60   Temp (!) 97.4 F (36.3 C)   Resp 18   Ht 5\' 9"  (1.753 m)   Wt 213 lb 3.2 oz (96.7 kg)   BMI 31.48 kg/m   General Appearance: Over nourished and  and in no apparent distress.  Eyes: PERRLA, EOMs, conjunctiva no swelling or erythema, normal fundi and vessels. Sinuses: No frontal/maxillary tenderness ENT/Mouth: EACs patent / TMs  nl. Nares clear without erythema, swelling, mucoid exudates. Oral hygiene is good. No erythema, swelling, or exudate. Tongue normal, non-obstructing. Tonsils not swollen or erythematous. Hearing normal.  Neck: Supple, thyroid not palpable. No bruits, nodes  or JVD. Respiratory: Respiratory effort normal.  BS equal and clear bilateral without rales, rhonci, wheezing or stridor. Cardio: Heart sounds reveal mechanical valve sounds with regular rate and rhythm and no murmurs, rubs or gallops. Peripheral pulses are normal and equal bilaterally without edema. No aortic or femoral bruits. Chest: symmetric with normal excursions and percussion.  Abdomen: Soft, with Nl bowel sounds. Nontender, no guarding, rebound, hernias, masses, or organomegaly.  Lymphatics: Non tender without lymphadenopathy.  Musculoskeletal: Full ROM all peripheral extremities, joint stability, 5/5 strength, and normal gait. Skin: Warm and dry without rashes, lesions, cyanosis, clubbing or  ecchymosis.  Neuro: Cranial nerves intact, reflexes equal bilaterally. Normal muscle tone, no cerebellar symptoms. Sensation intact.  Pysch: Alert and oriented X 3 with normal affect, insight and judgment appropriate.   Assessment and Plan  1. Annual Preventative/Screening Exam   2. Essential hypertension  - EKG 12-Lead - [place], RETROPERITNL ABD,  LTD - Urinalysis, Routine w reflex microscopic - Microalbumin / creatinine urine ratio - CBC with Differential/Platelet - COMPLETE METABOLIC PANEL WITH GFR - Magnesium - TSH  3. Hyperlipidemia associated with type 2 diabetes mellitus ([place])  - EKG 12-Lead - [place], RETROPERITNL ABD,  LTD - Lipid panel - TSH  4. Type 2 diabetes mellitus with stage 2 chronic kidney disease,  without long-term current use of insulin (HCC)  - EKG 12-Lead - [place], RETROPERITNL ABD,  LTD - Urinalysis, Routine w reflex microscopic - Microalbumin / creatinine urine ratio - HM DIABETES FOOT EXAM - LOW EXTREMITY NEUR EXAM DOCUM - Hemoglobin A1c - Insulin, random  5. Vitamin D deficiency  - VITAMIN D 25 Hydroxy   6. Coronary artery disease due to lipid rich plaque  - EKG 12-Lead - Lipid panel  7. Hx of CABG  - EKG 12-Lead - Lipid panel  8. H/O mechanical  aortic valve replacement  - EKG 12-Lead  9. Acquired thrombophilia ([place])   10. Gastroesophageal reflux disease    11. BPH with obstruction/lower urinary tract symptoms  - PSA  12. Screening for colorectal cancer  - POC Hemoccult Bld/Stl   13. Prostate cancer screening  - PSA  14. Screening for ischemic heart disease  - [place], RETROPERITNL ABD,  LTD  15. FHx: heart disease  - [place], RETROPERITNL ABD,  LTD - Urinalysis, Routine w reflex microscopic  16. Screening for AAA (aortic abdominal aneurysm)  - Urinalysis, Routine w reflex microscopic  17. Medication management  - Microalbumin / creatinine urine ratio - POC Hemoccult Bld/Stl - CBC with Differential/Platelet - COMPLETE METABOLIC PANEL WITH GFR - Magnesium - Lipid panel - TSH -  Hemoglobin A1c - Insulin, random - VITAMIN D 25 Hydroxy            Patient was counseled in prudent diet, weight control to achieve/maintain BMI less than 25, BP monitoring, regular exercise and medications as discussed.  Discussed med effects and SE's. Routine screening labs and tests as requested with regular follow-up as recommended. Over 40 minutes of exam, counseling, chart review and high complex critical decision making was performed   Kirtland Bouchard, MD

## 2019-12-19 NOTE — Patient Instructions (Signed)

## 2019-12-20 ENCOUNTER — Other Ambulatory Visit: Payer: Self-pay

## 2019-12-20 ENCOUNTER — Ambulatory Visit (INDEPENDENT_AMBULATORY_CARE_PROVIDER_SITE_OTHER): Payer: PPO | Admitting: Internal Medicine

## 2019-12-20 ENCOUNTER — Encounter: Payer: Self-pay | Admitting: Internal Medicine

## 2019-12-20 VITALS — BP 130/80 | HR 60 | Temp 97.4°F | Resp 18 | Ht 69.0 in | Wt 213.2 lb

## 2019-12-20 DIAGNOSIS — Z951 Presence of aortocoronary bypass graft: Secondary | ICD-10-CM | POA: Diagnosis not present

## 2019-12-20 DIAGNOSIS — I10 Essential (primary) hypertension: Secondary | ICD-10-CM | POA: Diagnosis not present

## 2019-12-20 DIAGNOSIS — Z0001 Encounter for general adult medical examination with abnormal findings: Secondary | ICD-10-CM

## 2019-12-20 DIAGNOSIS — E538 Deficiency of other specified B group vitamins: Secondary | ICD-10-CM

## 2019-12-20 DIAGNOSIS — Z136 Encounter for screening for cardiovascular disorders: Secondary | ICD-10-CM

## 2019-12-20 DIAGNOSIS — N138 Other obstructive and reflux uropathy: Secondary | ICD-10-CM | POA: Diagnosis not present

## 2019-12-20 DIAGNOSIS — N401 Enlarged prostate with lower urinary tract symptoms: Secondary | ICD-10-CM | POA: Diagnosis not present

## 2019-12-20 DIAGNOSIS — Z125 Encounter for screening for malignant neoplasm of prostate: Secondary | ICD-10-CM | POA: Diagnosis not present

## 2019-12-20 DIAGNOSIS — I2510 Atherosclerotic heart disease of native coronary artery without angina pectoris: Secondary | ICD-10-CM | POA: Diagnosis not present

## 2019-12-20 DIAGNOSIS — E1122 Type 2 diabetes mellitus with diabetic chronic kidney disease: Secondary | ICD-10-CM | POA: Diagnosis not present

## 2019-12-20 DIAGNOSIS — N182 Chronic kidney disease, stage 2 (mild): Secondary | ICD-10-CM

## 2019-12-20 DIAGNOSIS — D6869 Other thrombophilia: Secondary | ICD-10-CM

## 2019-12-20 DIAGNOSIS — Z8249 Family history of ischemic heart disease and other diseases of the circulatory system: Secondary | ICD-10-CM | POA: Diagnosis not present

## 2019-12-20 DIAGNOSIS — Z1212 Encounter for screening for malignant neoplasm of rectum: Secondary | ICD-10-CM

## 2019-12-20 DIAGNOSIS — Z952 Presence of prosthetic heart valve: Secondary | ICD-10-CM

## 2019-12-20 DIAGNOSIS — I2583 Coronary atherosclerosis due to lipid rich plaque: Secondary | ICD-10-CM

## 2019-12-20 DIAGNOSIS — Z0000 Encounter for general adult medical examination without abnormal findings: Secondary | ICD-10-CM

## 2019-12-20 DIAGNOSIS — E1169 Type 2 diabetes mellitus with other specified complication: Secondary | ICD-10-CM

## 2019-12-20 DIAGNOSIS — Z79899 Other long term (current) drug therapy: Secondary | ICD-10-CM

## 2019-12-20 DIAGNOSIS — Z1211 Encounter for screening for malignant neoplasm of colon: Secondary | ICD-10-CM

## 2019-12-20 DIAGNOSIS — E785 Hyperlipidemia, unspecified: Secondary | ICD-10-CM | POA: Diagnosis not present

## 2019-12-20 DIAGNOSIS — B379 Candidiasis, unspecified: Secondary | ICD-10-CM

## 2019-12-20 DIAGNOSIS — K219 Gastro-esophageal reflux disease without esophagitis: Secondary | ICD-10-CM

## 2019-12-20 DIAGNOSIS — E559 Vitamin D deficiency, unspecified: Secondary | ICD-10-CM

## 2019-12-20 MED ORDER — CYANOCOBALAMIN 1000 MCG/ML IJ SOLN: INTRAMUSCULAR | 2 refills | Status: DC

## 2019-12-20 MED ORDER — CLOTRIMAZOLE-BETAMETHASONE 1-0.05 % EX CREA: TOPICAL_CREAM | CUTANEOUS | 3 refills | Status: DC

## 2019-12-21 LAB — URINALYSIS, ROUTINE W REFLEX MICROSCOPIC
Bacteria, UA: NONE SEEN /HPF
Bilirubin Urine: NEGATIVE
Hyaline Cast: NONE SEEN /LPF
Ketones, ur: NEGATIVE
Leukocytes,Ua: NEGATIVE
Nitrite: NEGATIVE
Protein, ur: NEGATIVE
RBC / HPF: NONE SEEN /HPF (ref 0–2)
Specific Gravity, Urine: 1.021 (ref 1.001–1.03)
Squamous Epithelial / HPF: NONE SEEN /HPF
WBC, UA: NONE SEEN /HPF (ref 0–5)
pH: <=5 (ref 5.0–8.0)

## 2019-12-21 LAB — CBC WITH DIFFERENTIAL/PLATELET
Absolute Monocytes: 523 {cells}/uL (ref 200–950)
Basophils Absolute: 40 {cells}/uL (ref 0–200)
Basophils Relative: 0.6 %
Eosinophils Absolute: 174 {cells}/uL (ref 15–500)
Eosinophils Relative: 2.6 %
HCT: 43.4 % (ref 38.5–50.0)
Hemoglobin: 14.5 g/dL (ref 13.2–17.1)
Lymphs Abs: 1246 {cells}/uL (ref 850–3900)
MCH: 31.1 pg (ref 27.0–33.0)
MCHC: 33.4 g/dL (ref 32.0–36.0)
MCV: 93.1 fL (ref 80.0–100.0)
MPV: 11.6 fL (ref 7.5–12.5)
Monocytes Relative: 7.8 %
Neutro Abs: 4717 {cells}/uL (ref 1500–7800)
Neutrophils Relative %: 70.4 %
Platelets: 169 10*3/uL (ref 140–400)
RBC: 4.66 Million/uL (ref 4.20–5.80)
RDW: 13.4 % (ref 11.0–15.0)
Total Lymphocyte: 18.6 %
WBC: 6.7 10*3/uL (ref 3.8–10.8)

## 2019-12-21 LAB — PSA: PSA: <0.1 ng/mL

## 2019-12-21 LAB — COMPLETE METABOLIC PANEL WITHOUT GFR
AG Ratio: 2.1 (calc) (ref 1.0–2.5)
ALT: 13 U/L (ref 9–46)
AST: 13 U/L (ref 10–35)
Albumin: 4.1 g/dL (ref 3.6–5.1)
Alkaline phosphatase (APISO): 83 U/L (ref 35–144)
BUN: 12 mg/dL (ref 7–25)
CO2: 25 mmol/L (ref 20–32)
Calcium: 8.8 mg/dL (ref 8.6–10.3)
Chloride: 105 mmol/L (ref 98–110)
Creat: 0.74 mg/dL (ref 0.70–1.18)
GFR, Est African American: 103 mL/min/{1.73_m2}
GFR, Est Non African American: 89 mL/min/{1.73_m2}
Globulin: 2 g/dL (ref 1.9–3.7)
Glucose, Bld: 209 mg/dL — ABNORMAL HIGH (ref 65–99)
Potassium: 4.6 mmol/L (ref 3.5–5.3)
Sodium: 140 mmol/L (ref 135–146)
Total Bilirubin: 0.7 mg/dL (ref 0.2–1.2)
Total Protein: 6.1 g/dL (ref 6.1–8.1)

## 2019-12-21 LAB — HEMOGLOBIN A1C
Hgb A1c MFr Bld: 7.9 %{Hb} — ABNORMAL HIGH
Mean Plasma Glucose: 180 (calc)
eAG (mmol/L): 10 (calc)

## 2019-12-21 LAB — MAGNESIUM: Magnesium: 1.8 mg/dL (ref 1.5–2.5)

## 2019-12-21 LAB — MICROALBUMIN / CREATININE URINE RATIO
Creatinine, Urine: 86 mg/dL (ref 20–320)
Microalb Creat Ratio: 31 ug/mg{creat} — ABNORMAL HIGH
Microalb, Ur: 2.7 mg/dL

## 2019-12-21 LAB — INSULIN, RANDOM: Insulin: 15.3 u[IU]/mL

## 2019-12-21 LAB — VITAMIN D 25 HYDROXY (VIT D DEFICIENCY, FRACTURES): Vit D, 25-Hydroxy: 26 ng/mL — ABNORMAL LOW (ref 30–100)

## 2019-12-21 LAB — LIPID PANEL
Cholesterol: 124 mg/dL
HDL: 41 mg/dL
LDL Cholesterol (Calc): 65 mg/dL
Non-HDL Cholesterol (Calc): 83 mg/dL
Total CHOL/HDL Ratio: 3 (calc)
Triglycerides: 93 mg/dL

## 2019-12-21 LAB — TSH: TSH: 1.17 m[IU]/L (ref 0.40–4.50)

## 2019-12-22 ENCOUNTER — Other Ambulatory Visit: Payer: Self-pay | Admitting: Internal Medicine

## 2019-12-24 ENCOUNTER — Encounter: Payer: Self-pay | Admitting: Internal Medicine

## 2019-12-25 ENCOUNTER — Other Ambulatory Visit: Payer: Self-pay | Admitting: Internal Medicine

## 2019-12-26 ENCOUNTER — Telehealth: Payer: Self-pay | Admitting: Internal Medicine

## 2019-12-26 DIAGNOSIS — Z01812 Encounter for preprocedural laboratory examination: Secondary | ICD-10-CM

## 2019-12-26 DIAGNOSIS — I77810 Thoracic aortic ectasia: Secondary | ICD-10-CM

## 2019-12-26 DIAGNOSIS — I712 Thoracic aortic aneurysm, without rupture, unspecified: Secondary | ICD-10-CM

## 2019-12-26 NOTE — Telephone Encounter (Signed)
Patient states Dr. Debara Pickett will be writing him a letter that he will need to take to his DOT Physical. He has his physical 02-09 and wanted to make sure the letter would be done by then.

## 2019-12-26 NOTE — Telephone Encounter (Signed)
Will route to nurse- are you aware of patient needing a letter by today?

## 2019-12-27 ENCOUNTER — Ambulatory Visit (HOSPITAL_COMMUNITY): Payer: PPO | Attending: Internal Medicine

## 2019-12-27 ENCOUNTER — Encounter (INDEPENDENT_AMBULATORY_CARE_PROVIDER_SITE_OTHER): Payer: Self-pay

## 2019-12-27 ENCOUNTER — Other Ambulatory Visit: Payer: Self-pay

## 2019-12-27 DIAGNOSIS — I4892 Unspecified atrial flutter: Secondary | ICD-10-CM

## 2019-12-27 DIAGNOSIS — Z951 Presence of aortocoronary bypass graft: Secondary | ICD-10-CM | POA: Diagnosis present

## 2019-12-27 DIAGNOSIS — Z952 Presence of prosthetic heart valve: Secondary | ICD-10-CM | POA: Diagnosis present

## 2019-12-28 ENCOUNTER — Telehealth: Payer: Self-pay | Admitting: Internal Medicine

## 2019-12-28 NOTE — Telephone Encounter (Signed)
Sent to MD - please advise on letter, recent echo results

## 2019-12-28 NOTE — Telephone Encounter (Signed)
Patient states he needs a letter for his DOT physical.

## 2019-12-29 ENCOUNTER — Encounter: Payer: Self-pay | Admitting: Internal Medicine

## 2019-12-29 NOTE — Telephone Encounter (Signed)
Patient aware letter is ready to be picked up.   Also notified him of echo results & ordered CT angiogram to be done in August 2021. Reminded him of March 30 virtual visit.

## 2019-12-29 NOTE — Telephone Encounter (Signed)
I wrote the letter for Mr. Loftus - printed to the nurses station printer at the office in the back. Doesn't look like he has myChart.  Dr Lemmie Evens

## 2019-12-29 NOTE — Telephone Encounter (Signed)
Duplicate encounter concerning same letter. Patient aware it is ready for pick up

## 2020-01-10 ENCOUNTER — Other Ambulatory Visit: Payer: Self-pay

## 2020-01-10 ENCOUNTER — Ambulatory Visit (INDEPENDENT_AMBULATORY_CARE_PROVIDER_SITE_OTHER): Payer: PPO | Admitting: Pharmacist

## 2020-01-10 DIAGNOSIS — Z7901 Long term (current) use of anticoagulants: Secondary | ICD-10-CM

## 2020-01-10 DIAGNOSIS — Z952 Presence of prosthetic heart valve: Secondary | ICD-10-CM

## 2020-01-10 LAB — POCT INR: INR: 3.8 — AB (ref 2.0–3.0)

## 2020-01-17 ENCOUNTER — Other Ambulatory Visit: Payer: Self-pay | Admitting: Internal Medicine

## 2020-01-30 ENCOUNTER — Other Ambulatory Visit: Payer: Self-pay

## 2020-01-30 DIAGNOSIS — Z79899 Other long term (current) drug therapy: Secondary | ICD-10-CM

## 2020-01-30 DIAGNOSIS — Z1211 Encounter for screening for malignant neoplasm of colon: Secondary | ICD-10-CM

## 2020-01-30 DIAGNOSIS — Z1212 Encounter for screening for malignant neoplasm of rectum: Secondary | ICD-10-CM

## 2020-01-30 LAB — POC HEMOCCULT BLD/STL (HOME/3-CARD/SCREEN)
Card #2 Fecal Occult Blod, POC: NEGATIVE
Card #3 Fecal Occult Blood, POC: NEGATIVE
Fecal Occult Blood, POC: NEGATIVE

## 2020-01-31 DIAGNOSIS — Z1211 Encounter for screening for malignant neoplasm of colon: Secondary | ICD-10-CM | POA: Diagnosis not present

## 2020-01-31 DIAGNOSIS — Z1212 Encounter for screening for malignant neoplasm of rectum: Secondary | ICD-10-CM | POA: Diagnosis not present

## 2020-02-21 ENCOUNTER — Other Ambulatory Visit: Payer: Self-pay

## 2020-02-21 ENCOUNTER — Telehealth: Payer: PPO | Admitting: Internal Medicine

## 2020-02-21 ENCOUNTER — Ambulatory Visit (INDEPENDENT_AMBULATORY_CARE_PROVIDER_SITE_OTHER): Payer: PPO | Admitting: Pharmacist Clinician (PhC)/ Clinical Pharmacy Specialist

## 2020-02-21 DIAGNOSIS — Z952 Presence of prosthetic heart valve: Secondary | ICD-10-CM

## 2020-02-21 DIAGNOSIS — Z7901 Long term (current) use of anticoagulants: Secondary | ICD-10-CM

## 2020-02-21 LAB — POCT INR: INR: 2.5 (ref 2.0–3.0)

## 2020-02-24 ENCOUNTER — Encounter: Payer: Self-pay | Admitting: Internal Medicine

## 2020-02-24 ENCOUNTER — Ambulatory Visit: Payer: PPO | Admitting: Internal Medicine

## 2020-02-24 VITALS — BP 125/70 | HR 72 | Temp 97.3°F | Ht 69.0 in | Wt 213.0 lb

## 2020-02-24 DIAGNOSIS — Z7901 Long term (current) use of anticoagulants: Secondary | ICD-10-CM | POA: Diagnosis not present

## 2020-02-24 DIAGNOSIS — I77810 Thoracic aortic ectasia: Secondary | ICD-10-CM

## 2020-02-24 DIAGNOSIS — Z952 Presence of prosthetic heart valve: Secondary | ICD-10-CM | POA: Diagnosis not present

## 2020-02-24 DIAGNOSIS — I4891 Unspecified atrial fibrillation: Secondary | ICD-10-CM

## 2020-02-24 NOTE — Patient Instructions (Addendum)
Medication Instructions:  Your physician recommends that you continue on your current medications as directed. Please refer to the Current Medication list given to you today.  *If you need a refill on your cardiac medications before your next appointment, please call your pharmacy*  Follow-Up: At [facility], you and your health needs are our priority.  As part of our continuing mission to provide you with exceptional heart care, we have created designated Provider Care Teams.  These Care Teams include your primary Cardiologist (physician) and Advanced Practice Providers (APPs -  Physician Assistants and Nurse Practitioners) who all work together to provide you with the care you need, when you need it.  We recommend signing up for the patient portal called "MyChart".  Sign up information is provided on this After Visit Summary.  MyChart is used to connect with patients for Virtual Visits (Telemedicine).  Patients are able to view lab/test results, encounter notes, upcoming appointments, etc.  Non-urgent messages can be sent to your provider as well.   To learn more about what you can do with MyChart, go to [email or web address].    Your next appointment:   12 month(s)  The format for your next appointment:   In Person  Provider:   You may see [name], MD or one of the following Advanced Practice Providers on your designated Care Team:    Almyra Deforest, PA-C  Fabian Sharp, [place] or   Roby Lofts, [place]    Other Instructions  You will be contacted to arrange a CT angiogram of your chest/aorta in August 2021

## 2020-02-24 NOTE — Progress Notes (Signed)
OFFICE NOTE  Chief Complaint:  No complaints  Primary Care Physician: Unk Pinto, MD  HPI:  Billy Reilly is a 79 year old gentleman, previously followed by Dr. Rex Kras, with a history of coronary disease. In 2004, he had CABG and replacement of his aortic valve with a mechanical prosthesis. He had 2-vessel bypass at the time with saphenous vein graft to the first diagonal and saphenous vein graft to the posterior descending by Dr. Prescott Gum, and replacement of the aortic valve with a 22 mm ATS AP valve. He has done well with valve replacement to this point without any recurrent angina, shortness of breath, palpitations, presyncope, or syncopal symptoms. INR checked today in the office is 2.6 and it has been stable on Coumadin without dose adjustments for at least a year. 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 Recently he was started on Farxiga, which is resulted in excellent weight loss as well as control of his blood sugars.  Overall he feels quite well.  I saw Billy Reilly back today in the office. He is here for DOT visit. When I last saw him he was doing well denies any chest pain or shortness of breath. That holds true today. His warfarin levels have been well controlled his INR was 3.1 today. He has not had stress testing in over 5 years and his bypass was in 2004. 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  Billy Reilly returns today for follow-up. I last saw him 6 months ago for DOT physical. At that time we performed a nuclear stress test which was negative for ischemia. 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 He denies any chest pain or worsening shortness of breath. Blood pressures been well controlled.  I saw Billy Reilly back today in the office for follow-up. He returns for a DOT letter. He continues to do well after bypass surgery  mechanical valve replacement. His INRs have been therapeutic. He is completely asymptomatic. 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 He had a stress test last year which was negative for ischemia. He is due for repeat echocardiogram to reassess his valve gradients this year.  01/20/2017  Billy Reilly was seen today in follow-up. He is due for renewal of his DOT physical. From a cardiac standpoint he seems stable. Denies a chest pain or worsening shortness of breath. An echocardiogram last year which showed a stable aortic valve gradient. 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 He is on warfarin and INR today was 2.8. EKG shows sinus rhythm.  12/23/2017  Billy Reilly was seen today for routine follow-up.  He continues to work and requires a renewal of his DOT letter.  He has an upcoming echo at the end of February to assess the stability of his valve.  He denies any chest pain worsening shortness of breath.  His INR was a slightly low he needs some adjustment.  EKG is unchanged.  01/10/2019  Billy Reilly returns today for follow-up.  Overall he continues to do well.  He still working for [employer].  He drives oil trucks.  He continues to get annual DOT physicals.  He has an upcoming physical.  His echo last year showed a stable LVEF with normally functioning mechanical aortic valve.  His INR was  therapeutic today on warfarin.  He denies any worsening shortness of breath.  He has had no further chest pain.  He is now more than 15 years out from his bypass surgery.  I did review lab work from December 2019 which showed total cholesterol 146, HDL 40, LDL 89 and triglycerides 81.  His hemoglobin A1c was 8.4.  We discussed diet as well does sound like he has somewhat of increased saturated fats in his diet which could be further optimized.  12/05/2019  Billy Reilly is seen today in follow-up.  Overall he remains asymptomatic.  Denies any new or  worsening chest pain or shortness of breath.  Recently saw his primary care provider and needs clearance for DOT physical.  Today he was noted incidentally to have atrial flutter with borderline ventricular response on his EKG.  Review of prior EKGs did not demonstrate this.  Fortunately he is anticoagulated on warfarin for his mechanical aortic valve.  He has had therapeutic INRs with this and is due for an INR today.  02/24/2020  Billy Reilly is seen today in follow-up. When I last saw him he was in newly recognized atrial flutter. He stated he was asymptomatic. I repeated his echo which showed normal LVEF 65 to 70% however there is severe left atrial enlargement and moderate right atrial enlargement. His mechanical aortic valve seems to be working appropriately. 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 He reports he continues to be asymptomatic. Today he is noted to be in atrial fibrillation.  PMHx:  Past Medical History:  Diagnosis Date  . Anemia   . AS (aortic stenosis)    valve replacement-st. jude mechanical valve  . ASHD (arteriosclerotic heart disease)   . CAD (coronary artery disease), native coronary artery    CABG 07/18/2003  . Cancer [facility])    prostate  . GERD (gastroesophageal reflux disease)   . History of nuclear stress test 02/12/10   EF 67%, normal perfusion  . Hyperlipidemia   . Hypertension   . Morbid obesity ([place])   . Type II or unspecified type diabetes mellitus without mention of complication, not stated as uncontrolled   . Umbilical hernia   . Vitamin B12 deficiency   . Vitamin D deficiency     Past Surgical History:  Procedure Laterality Date  . AORTIC VALVE REPLACEMENT  07/18/2003   64mm ATS-AP valve  . AORTIC VALVE REPLACEMENT (AVR)/CORONARY ARTERY BYPASS GRAFTING (CABG)  07/18/2003   AVR; bypass x 2-SVG to first diag, SVG to posterior descending  . CARDIAC CATHETERIZATION  07/13/2003   AS-aortic valve gradient 9mmhg, valve  area 1.1; obstruction of ostium of posterior lateral branch with obstruction in the ostium of first diagnonal  . HERNIA REPAIR  85/88/5027   RIH, umbilical  . NM MYOCAR PERF WALL MOTION  02/12/2010   protocol:Bruce, EF 67%, exercise cap 7 METS, low risk scan  . TEE WITHOUT CARDIOVERSION  07/18/2003   no evidence of PE, Aortic valve heavily calcified,   . TRANSTHORACIC ECHOCARDIOGRAM  02/21/2013   EF60-65%, Septal motion showed paradox. atrium moderately dilated    FAMHx:  Family History  Problem Relation Age of Onset  . Stroke Mother   . Heart attack Father   . Heart disease Father   . Cancer Brother   . Cancer Sister     SOCHx:   reports that he has never smoked. He has never used smokeless tobacco. He reports that he does not  drink alcohol or use drugs.  ALLERGIES:  No Known Allergies  ROS: Pertinent items noted in HPI and remainder of comprehensive ROS otherwise negative.  HOME MEDS: Current Outpatient Medications  Medication Sig Dispense Refill  . aspirin 81 MG tablet Take 81 mg by mouth daily.      . CHOLECALCIFEROL PO Take 1,000 Units by mouth 2 (two) times daily.    Marland Kitchen CINNAMON PO Take 1,000 mg by mouth 3 (three) times daily.     . clotrimazole-betamethasone (LOTRISONE) cream Apply to rash 2 to 3 x /day 45 g 3  . cyanocobalamin (,VITAMIN B-12,) 1000 MCG/ML injection INJECT 1/2 ML EVERY MONTH 10 mL 2  . enalapril (VASOTEC) 20 MG tablet TAKE 2 TABLETS BY MOUTH EVERY DAY 180 tablet 0  . famotidine (PEPCID) 20 MG tablet TAKE 1 TABLET 2 X /DAY WITH MEALS FOR ACID INDIGESTION 180 tablet 3  . glipiZIDE (GLUCOTROL) 10 MG tablet Take 10 mg by mouth 2 (two) times daily before a meal. TAKE 1 TABLRT AT BREAKFAST, 2 AT LUNCH, 1 AT SUPPERER    . glucose blood (ONE TOUCH ULTRA TEST) test strip CHECK BLOOD SUGAR ONCE DAILY 100 each 1  . metFORMIN (GLUCOPHAGE-XR) 500 MG 24 hr tablet Take 2 tablets 2 x /day with Meals for Diabetes 360 tablet 3  . metoprolol succinate (TOPROL-XL) 50 MG  24 hr tablet TAKE 1 TABLET (50 MG TOTAL) BY MOUTH DAILY. TAKE WITH OR IMMEDIATELY FOLLOWING A MEAL. 90 tablet 3  . simvastatin (ZOCOR) 40 MG tablet Take 1 tablet at Bedtime for Cholesterol 90 tablet 3  . terbinafine (LAMISIL) 250 MG tablet TAKE 1 TABLET BY MOUTH DAILY FOR 1 MONTH ON, 1 MONTH OFF UNTIL RUN OUT 90 tablet 0  . warfarin (COUMADIN) 5 MG tablet TAKE 1/2 TO 1 TABLET BY MOUTH DAILY OR AS DIRECTED 90 tablet 0   No current facility-administered medications for this visit.    LABS/IMAGING: No results found for this or any previous visit (from the past 48 hour(s)). No results found.  VITALS: BP 125/70   Pulse 72   Temp (!) 97.3 F (36.3 C)   Ht 5\' 9"  (1.753 m)   Wt 213 lb (96.6 kg)   SpO2 96%   BMI 31.45 kg/m   EXAM: Deferred  EKG: Atrial fibrillation at 73 -personally reviewed  ASSESSMENT: 1. New onset atrial flutter / now atrial fibrillation 2. CAD status post 2 vessel CABG (SVG to diagonal SVG to PDA) in 2004 3. Status post ATS mechanical aortic valve 4. Lifetime anticoagulation on warfarin 5. HTN 6. Dyslipidemia 7. DM2 8. Encounter for DOT physical 9. 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   Billy Reilly has had both atrial flutter and atrial fibrillation however his echo shows severe left atrial and moderate right atrial enlargement. EF is normal and he is asymptomatic. His mechanical aortic valve is working properly. He is anticoagulated lifetime on warfarin. INR recently was appropriate at 2.5. At this point I do not think there is a clear indication to proceed for cardioversion. He is unlikely to convert or stay in rhythm given his atrial enlargement without antiarrhythmic therapy and he is asymptomatic. We will plan continued rate control and anticoagulation. He will have a follow-up CT scan in 6 months to evaluate his ascending aortic aneurysm.  [name], MD, [facility], [place] Director of the Advanced Lipid Disorders  &  Cardiovascular Risk Reduction Clinic Diplomate of the American Board  of Clinical Lipidology Attending Cardiologist  Direct Dial: [phone]  Fax: [phone]  Website:  [email or web address]  Nadean Corwin Cypress Hinkson 02/24/2020, 8:14 AM

## 2020-02-27 ENCOUNTER — Other Ambulatory Visit: Payer: Self-pay | Admitting: Internal Medicine

## 2020-03-01 ENCOUNTER — Ambulatory Visit: Payer: PPO | Admitting: Adult Health

## 2020-03-20 ENCOUNTER — Other Ambulatory Visit: Payer: Self-pay

## 2020-03-20 ENCOUNTER — Ambulatory Visit (INDEPENDENT_AMBULATORY_CARE_PROVIDER_SITE_OTHER): Payer: PPO | Admitting: Pharmacist Clinician (PhC)/ Clinical Pharmacy Specialist

## 2020-03-20 DIAGNOSIS — Z952 Presence of prosthetic heart valve: Secondary | ICD-10-CM | POA: Diagnosis not present

## 2020-03-20 DIAGNOSIS — Z7901 Long term (current) use of anticoagulants: Secondary | ICD-10-CM | POA: Diagnosis not present

## 2020-03-20 LAB — POCT INR: INR: 2.4 (ref 2.0–3.0)

## 2020-03-28 DIAGNOSIS — N182 Chronic kidney disease, stage 2 (mild): Secondary | ICD-10-CM | POA: Insufficient documentation

## 2020-03-28 DIAGNOSIS — E1122 Type 2 diabetes mellitus with diabetic chronic kidney disease: Secondary | ICD-10-CM | POA: Insufficient documentation

## 2020-03-28 NOTE — Progress Notes (Signed)
MEDICARE ANNUAL WELLNESS VISIT AND FOLLOW UP Assessment:    Diagnoses and all orders for this visit:  Encounter for Medicare annual wellness exam  Coronary artery disease due to lipid rich plaque Control blood pressure, cholesterol, glucose, increase exercise.  Followed by cardiology -     Lipid panel  Essential hypertension Continue medication Monitor blood pressure at home; call if consistently over 140/80 Continue DASH diet.   Reminder to go to the ER if any CP, SOB, nausea, dizziness, severe HA, changes vision/speech, left arm numbness and tingling and jaw pain.  Type II diabetes mellitus with nephropathy [facility]) Education: Reviewed 'ABCs' of diabetes management (respective goals in parentheses):  A1C (<7), blood pressure (<130/80), and cholesterol (LDL <70) Eye Exam yearly and Dental Exam every 6 months  Dietary recommendations Physical Activity recommendations Just had foot exam - defer today Check bedtime/2am/fasting glucose - call back if night time lower than AM fasting - possible nocturnal hypoglycemia, fasting higher than expected for A1C, patient denies hypoglycemia sx -     Hemoglobin A1c  H/O mechanical aortic valve replacement Continue coumadin; followed by cardiology  Hx of CABG Control blood pressure, cholesterol, glucose, increase exercise.  Followed by cardiolgy  Long term current use of anticoagulant therapy Managed by cardiology Discussed if patient falls to immediately contact office or go to ER. Discussed foods that can increase or decrease Coumadin levels.  Patient understands to call the office before starting a new medication.  Medication management -     CBC with Differential/Platelet -     CMPGFR  Mixed hyperlipidemia associated with T2DM ([place]) Continue medications Continue low cholesterol diet and exercise.  Check lipid panel.  -     Lipid panel -     TSH  CKD II associated with T2DM (HCC) Increase fluids, avoid NSAIDS, monitor sugars,  will monitor  Obesity (BMI 30.0-34.9) Long discussion about weight loss, diet, and exercise Recommended diet heavy in fruits and veggies and low in animal meats, cheeses, and dairy products, appropriate calorie intake Discussed appropriate weight for height and initial goal (200 lb) Follow up at next visit  Umbilical hernia without obstruction and without gangrene No complications; continue to monitor  Vitamin B12 deficiency Continue supplement; check levels at CPE  Vitamin D deficiency Patient has increased dose but admits taking irregularly, encouraged daily 5000 IU, can take extra next day if forgets Check vitamin D next visit  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, on coumadin for valve, following with cardiology  Rate controlled today, monitoring only for now, wasn't reocmmended CV  Ascending aortic aneurysm ([place]) Incidentally noted on recent ECHO, cardiology is monitoring and planning CTA in 6 months Control BP    Over 30 minutes of exam, counseling, chart review, and critical decision making was performed  Future Appointments  Date Time Provider [place]  04/17/2020 10:00 AM [facility]  07/05/2020 11:30 AM Unk Pinto, MD [facility] None  01/03/2021 11:00 AM Unk Pinto, MD [facility] None     Plan:   During the course of the visit the patient was educated and counseled about appropriate screening and preventive services including:    Pneumococcal vaccine   Influenza vaccine  Prevnar 13  Td vaccine  Screening electrocardiogram  Colorectal cancer screening  Diabetes screening  Glaucoma screening  Nutrition counseling    Subjective:  Billy Reilly is a 79 y.o. male with hx of ASCAD/CABG/AoVR who presents for Medicare Annual Wellness Visit and 3 month follow up for HTN, hyperlipidemia,  T2DM, and vitamin D Def.   Wife's health is not doing well, advanced kidney disease and discussing starting  dialysis.   He has hx of CABG & Mech AoVR in 2004; he is on coumadin and followed by coumadin clinic. Nuclear Stress test was negative in 2016 by Dr Debara Pickett. He was recentlly found to be in a. Fib/flutter at cardiology, asymptomatic. Had follow up ECHO showed severe/mod atrial enlargement with normal EF. Also incidentally noted ascending aortic aneurysm 4.4 cm which they plan to follow up with CTA in 6 months.   GERD is well controlled by famotidine 20 mg BID.   BMI is Body mass index is 31.31 kg/m., he has been working on diet and exercise. Weight goal < 200 lb, he has been reducing breakfast meat, eggs, doing oatmeal instead, doing protein + veggies for meal whenever possible, limited evening intake.  He drinks diet mountain dew - <1 bottle daily. He drinks unsweet tea.  Wt Readings from Last 3 Encounters:  03/29/20 212 lb (96.2 kg)  02/24/20 213 lb (96.6 kg)  12/20/19 213 lb 3.2 oz (96.7 kg)   His blood pressure has been controlled at home, today their BP is BP: 118/76 He does not workout. He denies chest pain, shortness of breath, dizziness.   He is on cholesterol medication (simvastatin 40 mg daily) and denies myalgias. His cholesterol is not at goal for LDL <70. The cholesterol last visit was:   Lab Results  Component Value Date   CHOL 124 12/20/2019   HDL 41 12/20/2019   LDLCALC 65 12/20/2019   TRIG 93 12/20/2019   CHOLHDL 3.0 12/20/2019   He has not been working on diet and exercise for T2DM (metformin 2000 mg daily, glipizide 10 mg - 4 tabs daily over meals, cinnamon), and denies foot ulcerations, hypoglycemia , increased appetite, nausea, paresthesia of the feet, polydipsia, polyuria, visual disturbances, vomiting and weight loss. He checks a fasting glucose about once weekly, running "about 185." Has had hypoglycemia in the past, typically "has sweats" but none recently. Last A1C in the office was:  Lab Results  Component Value Date   HGBA1C 7.9 (H) 12/20/2019   He has CKD II  associated with T2DM monitored at this office:  Lab Results  Component Value Date   GFRNONAA 89 12/20/2019    Patient is not on Vitamin D supplement and very low at recent check, admits has been forgetting to take, but has been taking 5000 IU 2-3 times a week:    Lab Results  Component Value Date   VD25OH 26 (L) 12/20/2019      Medication Review: Current Outpatient Medications on File Prior to Visit  Medication Sig Dispense Refill  . aspirin 81 MG tablet Take 81 mg by mouth daily.      . CHOLECALCIFEROL PO Take 5,000 Units by mouth daily. Takes 2-3 days per week currently     . CINNAMON PO Take 1,000 mg by mouth 3 (three) times daily.     . clotrimazole-betamethasone (LOTRISONE) cream Apply to rash 2 to 3 x /day 45 g 3  . cyanocobalamin (,VITAMIN B-12,) 1000 MCG/ML injection INJECT 1/2 ML EVERY MONTH 10 mL 2  . enalapril (VASOTEC) 20 MG tablet TAKE 2 TABLETS BY MOUTH EVERY DAY 180 tablet 0  . famotidine (PEPCID) 20 MG tablet TAKE 1 TABLET 2 X /DAY WITH MEALS FOR ACID INDIGESTION 180 tablet 3  . glipiZIDE (GLUCOTROL) 10 MG tablet Take 10 mg by mouth 2 (two) times daily before  a meal. TAKE 1 TABLRT AT BREAKFAST, 2 AT LUNCH, 1 AT SUPPERER    . glucose blood (ONE TOUCH ULTRA TEST) test strip CHECK BLOOD SUGAR ONCE DAILY 100 each 1  . metFORMIN (GLUCOPHAGE-XR) 500 MG 24 hr tablet Take 2 tablets 2 x /day with Meals for Diabetes 360 tablet 3  . metoprolol succinate (TOPROL-XL) 50 MG 24 hr tablet TAKE 1 TABLET (50 MG TOTAL) BY MOUTH DAILY. TAKE WITH OR IMMEDIATELY FOLLOWING A MEAL. 90 tablet 3  . simvastatin (ZOCOR) 40 MG tablet Take 1 tablet at Bedtime for Cholesterol 90 tablet 3  . warfarin (COUMADIN) 5 MG tablet TAKE 1/2 TO 1 TABLET BY MOUTH DAILY OR AS DIRECTED 90 tablet 0   No current facility-administered medications on file prior to visit.    Allergies: No Known Allergies  Current Problems (verified) has Long term current use of anticoagulant therapy; H/O mechanical aortic valve  replacement; Hyperlipidemia associated with type 2 diabetes mellitus ([place]); Type II diabetes mellitus with nephropathy ([place]); Vitamin D deficiency; Vitamin B12 deficiency; Essential hypertension; Medication management; Coronary artery disease due to lipid rich plaque; Hx of CABG; Encounter for Medicare annual wellness exam; Obesity (BMI 30.0-34.9); CKD stage 2 due to type 2 diabetes mellitus ([place]); Atrial fibrillation ([place]); and GERD (gastroesophageal reflux disease) on their problem list.  Screening Tests Immunization History  Administered Date(s) Administered  . Influenza Whole 09/01/2012  . Influenza, High Dose Seasonal PF 09/27/2015  . Influenza-Unspecified 08/25/2016, 08/24/2018, 09/13/2019  . Pneumococcal Conjugate-13 12/18/2015  . Pneumococcal Polysaccharide-23 09/01/2012  . Tdap 11/25/2007   Preventative care: Last colonoscopy: 2011  Cologuard: 11/2018 negative   Prior vaccinations: TD or Tdap: 2009 DUE patient declines Influenza: 08/2019  Pneumococcal: 2013 Prevnar13: 2017 Shingles/Zostavax: declines Covid 19: 1/2, 2021, pfizer   Names of Other Physician/Practitioners you currently use: 1.  [facility] here for primary care 2. Dr. Delman Cheadle, eye doctor, last visit 11/15/2019 3. Dr. Delphia Grates, dentist, last visit 2021, goes q68m  Patient Care Team: Unk Pinto, MD as PCP - General (Internal Medicine) Debara Pickett Nadean Corwin, MD as PCP - Cardiology (Cardiology) Debara Pickett Nadean Corwin, MD as Consulting Physician (Cardiology) Laurence Spates, MD (Inactive) as Consulting Physician (Gastroenterology)  Surgical: He  has a past surgical history that includes Aortic valve replacement (07/18/2003); Hernia repair (09/11/2011); Aortic valve replacement (avr)/coronary artery bypass grafting (cabg) (07/18/2003); Cardiac catheterization (07/13/2003); TEE without cardioversion (07/18/2003); transthoracic echocardiogram (02/21/2013); and NM MYOCAR PERF WALL  MOTION (02/12/2010). Family His family history includes Cancer in his brother and sister; Heart attack in his father; Heart disease in his father; Stroke in his mother. Social history  He reports that he has never smoked. He has never used smokeless tobacco. He reports that he does not drink alcohol or use drugs.  MEDICARE WELLNESS OBJECTIVES: Physical activity: Current Exercise Habits: The patient has a physically strenuous job, but has no regular exercise apart from work., Exercise limited by: None identified Cardiac risk factors: Cardiac Risk Factors include: advanced age (>42men, >31 women);dyslipidemia;hypertension;male gender;diabetes mellitus;obesity (BMI >30kg/m2) Depression/mood screen:   Depression screen [facility] 2/9 03/29/2020  Decreased Interest 0  Down, Depressed, Hopeless 0  PHQ - 2 Score 0    ADLs:  In your present state of health, do you have any difficulty performing the following activities: 03/29/2020 12/20/2019  Hearing? N N  Vision? N N  Difficulty concentrating or making decisions? N N  Walking or climbing stairs? N N  Dressing or bathing? N N  Doing errands, shopping?  N N  Preparing Food and eating ? - -  Some recent data might be hidden     Cognitive Testing  Alert? Yes  Normal Appearance?Yes  Oriented to person? Yes  Place? Yes   Time? Yes  Recall of three objects?  Yes  Can perform simple calculations? Yes  Displays appropriate judgment?Yes  Can read the correct time from a watch face?Yes  EOL planning: Does Patient Have a Medical Advance Directive?: Yes Type of Advance Directive: Living will Does patient want to make changes to medical advance directive?: No - Patient declined   Objective:   Today's Vitals   03/29/20 1339  BP: 118/76  Pulse: 98  Temp: 97.6 F (36.4 C)  SpO2: 95%  Weight: 212 lb (96.2 kg)  Height: 5\' 9"  (1.753 m)   Body mass index is 31.31 kg/m.  General appearance: alert, no distress, WD/WN, male HEENT: normocephalic, sclerae  anicteric, TMs pearly, nares patent, no discharge or erythema, pharynx normal Oral cavity: MMM, no lesions Neck: supple, no lymphadenopathy, no thyromegaly, no masses Heart: RRR, normal S1, S2, no murmurs Lungs: CTA bilaterally, no wheezes, rhonchi, or rales Abdomen: +bs, soft, non tender, non distended, no masses, no hepatomegaly, no splenomegaly Musculoskeletal: nontender, no swelling, no obvious deformity Extremities: no edema, no cyanosis, no clubbing Pulses: 2+ symmetric, upper and lower extremities, normal cap refill Neurological: alert, oriented x 3, CN2-12 intact, strength normal upper extremities and lower extremities, sensation normal throughout, DTRs 2+ throughout, no cerebellar signs, gait normal Psychiatric: normal affect, behavior normal, pleasant   Medicare Attestation I have personally reviewed: The patient's medical and social history Their use of alcohol, tobacco or illicit drugs Their current medications and supplements The patient's functional ability including ADLs,fall risks, home safety risks, cognitive, and hearing and visual impairment Diet and physical activities Evidence for depression or mood disorders  The patient's weight, height, BMI, and visual acuity have been recorded in the chart.  I have made referrals, counseling, and provided education to the patient based on review of the above and I have provided the patient with a written personalized care plan for preventive services.     Izora Ribas, NP   03/29/2020

## 2020-03-29 ENCOUNTER — Encounter: Payer: Self-pay | Admitting: Adult Health

## 2020-03-29 ENCOUNTER — Ambulatory Visit (INDEPENDENT_AMBULATORY_CARE_PROVIDER_SITE_OTHER): Payer: PPO | Admitting: Adult Health

## 2020-03-29 ENCOUNTER — Other Ambulatory Visit: Payer: Self-pay

## 2020-03-29 VITALS — BP 118/76 | HR 98 | Temp 97.6°F | Ht 69.0 in | Wt 212.0 lb

## 2020-03-29 DIAGNOSIS — E66811 Obesity, class 1: Secondary | ICD-10-CM

## 2020-03-29 DIAGNOSIS — E538 Deficiency of other specified B group vitamins: Secondary | ICD-10-CM | POA: Diagnosis not present

## 2020-03-29 DIAGNOSIS — I2583 Coronary atherosclerosis due to lipid rich plaque: Secondary | ICD-10-CM | POA: Diagnosis not present

## 2020-03-29 DIAGNOSIS — I712 Thoracic aortic aneurysm, without rupture: Secondary | ICD-10-CM | POA: Diagnosis not present

## 2020-03-29 DIAGNOSIS — Z0001 Encounter for general adult medical examination with abnormal findings: Secondary | ICD-10-CM | POA: Diagnosis not present

## 2020-03-29 DIAGNOSIS — N182 Chronic kidney disease, stage 2 (mild): Secondary | ICD-10-CM | POA: Diagnosis not present

## 2020-03-29 DIAGNOSIS — E785 Hyperlipidemia, unspecified: Secondary | ICD-10-CM | POA: Diagnosis not present

## 2020-03-29 DIAGNOSIS — Z952 Presence of prosthetic heart valve: Secondary | ICD-10-CM

## 2020-03-29 DIAGNOSIS — E559 Vitamin D deficiency, unspecified: Secondary | ICD-10-CM

## 2020-03-29 DIAGNOSIS — Z0000 Encounter for general adult medical examination without abnormal findings: Secondary | ICD-10-CM

## 2020-03-29 DIAGNOSIS — Z7901 Long term (current) use of anticoagulants: Secondary | ICD-10-CM

## 2020-03-29 DIAGNOSIS — I10 Essential (primary) hypertension: Secondary | ICD-10-CM | POA: Diagnosis not present

## 2020-03-29 DIAGNOSIS — E1169 Type 2 diabetes mellitus with other specified complication: Secondary | ICD-10-CM

## 2020-03-29 DIAGNOSIS — I2510 Atherosclerotic heart disease of native coronary artery without angina pectoris: Secondary | ICD-10-CM | POA: Diagnosis not present

## 2020-03-29 DIAGNOSIS — R6889 Other general symptoms and signs: Secondary | ICD-10-CM

## 2020-03-29 DIAGNOSIS — Z79899 Other long term (current) drug therapy: Secondary | ICD-10-CM

## 2020-03-29 DIAGNOSIS — E1121 Type 2 diabetes mellitus with diabetic nephropathy: Secondary | ICD-10-CM | POA: Diagnosis not present

## 2020-03-29 DIAGNOSIS — I7121 Aneurysm of the ascending aorta, without rupture: Secondary | ICD-10-CM

## 2020-03-29 DIAGNOSIS — K429 Umbilical hernia without obstruction or gangrene: Secondary | ICD-10-CM

## 2020-03-29 DIAGNOSIS — E1122 Type 2 diabetes mellitus with diabetic chronic kidney disease: Secondary | ICD-10-CM | POA: Diagnosis not present

## 2020-03-29 DIAGNOSIS — E669 Obesity, unspecified: Secondary | ICD-10-CM | POA: Diagnosis not present

## 2020-03-29 DIAGNOSIS — Z951 Presence of aortocoronary bypass graft: Secondary | ICD-10-CM

## 2020-03-29 DIAGNOSIS — I4891 Unspecified atrial fibrillation: Secondary | ICD-10-CM | POA: Diagnosis not present

## 2020-03-29 DIAGNOSIS — K219 Gastro-esophageal reflux disease without esophagitis: Secondary | ICD-10-CM

## 2020-03-29 NOTE — Patient Instructions (Addendum)
Billy Reilly , Thank you for taking time to come for your Medicare Wellness Visit. I appreciate your ongoing commitment to your health goals. Please review the following plan we discussed and let me know if I can assist you in the future.   These are the goals we discussed: Goals    . Blood Pressure < 120/80    . HEMOGLOBIN A1C < 8    . LDL CALC < 70    . Weight (lb) < 200 lb (90.7 kg)       This is a list of the screening recommended for you and due dates:  Health Maintenance  Topic Date Due  . Tetanus Vaccine  03/29/2021*  . COVID-19 Vaccine (2 - Pfizer 2-dose series) 04/08/2020  . Hemoglobin A1C  06/18/2020  . Flu Shot  06/24/2020  . Eye exam for diabetics  11/14/2020  . Complete foot exam   12/19/2020  . Pneumonia vaccines  Completed  *Topic was postponed. The date shown is not the original due date.       Please ask Dr. Debara Pickett if you need to be on aspirin or can stop? Can increase risk of bleeding with coumadin    Please check bedtime sugar, 2 am sugar and compare to next morning fasting sugar. If morning sugar is higher than night time, need to cut back on evening glipizide, so please let me know.    Preventing Hypoglycemia Hypoglycemia occurs when the level of sugar (glucose) in the blood is too low. Hypoglycemia can happen in people who do or do not have diabetes (diabetes mellitus). It can develop quickly, and it can be a medical emergency. For most people with diabetes, a blood glucose level below 70 mg/dL (3.9 mmol/L) is considered hypoglycemia. Glucose is a type of sugar that provides the body's main source of energy. Certain hormones (insulin and glucagon) control the level of glucose in the blood. Insulin lowers blood glucose, and glucagon increases blood glucose. Hypoglycemia can result from having too much insulin in the bloodstream, or from not eating enough food that contains glucose. Your risk for hypoglycemia is higher:  If you take insulin or diabetes medicines  to help lower your blood glucose or help your body make more insulin.  If you skip or delay a meal or snack.  If you are ill.  During and after exercise. You can prevent hypoglycemia by working with your health care provider to adjust your meal plan as needed and by taking other precautions. How can hypoglycemia affect me? Mild symptoms Mild hypoglycemia may not cause any symptoms. If you do have symptoms, they may include:  Hunger.  Anxiety.  Sweating and feeling clammy.  Dizziness or feeling light-headed.  Sleepiness.  Nausea.  Increased heart rate.  Headache.  Blurry vision.  Irritability.  Tingling or numbness around the mouth, lips, or tongue.  A change in coordination.  Restless sleep. If mild hypoglycemia is not recognized and treated, it can quickly become moderate or severe hypoglycemia. Moderate symptoms Moderate hypoglycemia can cause:  Mental confusion and poor judgment.  Behavior changes.  Weakness.  Irregular heartbeat. Severe symptoms Severe hypoglycemia is a medical emergency. It can cause:  Fainting.  Seizures.  Loss of consciousness (coma).  Death. What nutrition changes can be made?  Work with your health care provider or diet and nutrition specialist (dietitian) to make a healthy meal plan that is right for you. Follow your meal plan carefully.  Eat meals at regular times.  If recommended by  your health care provider, have snacks between meals.  Donot skip or delay meals or snacks. You can be at risk for hypoglycemia if you are not getting enough carbohydrates. What lifestyle changes can be made?   Work closely with your health care provider to manage your blood glucose. Make sure you know: ? Your goal blood glucose levels. ? How and when to check your blood glucose. ? The symptoms of hypoglycemia. It is important to treat it right away to keep it from becoming severe.  Do not drink alcohol on an empty stomach.  When you  are ill, check your blood glucose more often than usual. Follow your sick day plan whenever you cannot eat or drink normally. Make this plan in advance with your health care provider.  Always check your blood glucose before, during, and after exercise. How is this treated? This condition can often be treated by immediately eating or drinking something that contains sugar, such as:  Fruit juice, 4-6 oz (120-150 mL).  Regular (not diet) soda, 4-6 oz (120-150 mL).  Low-fat milk, 4 oz (120 mL).  Several pieces of hard candy.  Sugar or honey, 1 Tbsp (15 mL). Treating hypoglycemia if you have diabetes If you are alert and able to swallow safely, follow the 15:15 rule:  Take 15 grams of a rapid-acting carbohydrate. Talk with your health care provider about how much you should take.  Rapid-acting options include: ? Glucose pills (take 15 grams). ? 6-8 pieces of hard candy. ? 4-6 oz (120-150 mL) of fruit juice. ? 4-6 oz (120-150 mL) of regular (not diet) soda.  Check your blood glucose 15 minutes after you take the carbohydrate.  If the repeat blood glucose level is still at or below 70 mg/dL (3.9 mmol/L), take 15 grams of a carbohydrate again.  If your blood glucose level does not increase above 70 mg/dL (3.9 mmol/L) after 3 tries, seek emergency medical care.  After your blood glucose level returns to normal, eat a meal or a snack within 1 hour. Treating severe hypoglycemia Severe hypoglycemia is when your blood glucose level is at or below 54 mg/dL (3 mmol/L). Severe hypoglycemia is a medical emergency. Get medical help right away. If you have severe hypoglycemia and you cannot eat or drink, you may need an injection of glucagon. A family member or close friend should learn how to check your blood glucose and how to give you a glucagon injection. Ask your health care provider if you need to have an emergency glucagon injection kit available. Severe hypoglycemia may need to be treated in  a hospital. The treatment may include getting glucose through an IV. You may also need treatment for the cause of your hypoglycemia. Where to find more information  American Diabetes Association: www.diabetes.[employer] of Diabetes and Digestive and Kidney Diseases: [employer] Contact a health care provider if:  You have problems keeping your blood glucose in your target range.  You have frequent episodes of hypoglycemia. Get help right away if:  You continue to have hypoglycemia symptoms after eating or drinking something containing glucose.  Your blood glucose level is at or below 54 mg/dL (3 mmol/L).  You faint.  You have a seizure. These symptoms may represent a serious problem that is an emergency. Do not wait to see if the symptoms will go away. Get medical help right away. Call your local emergency services (911 in the [place] Summary  Know the symptoms of hypoglycemia, and when you are at  risk for it (such as during exercise or when you are sick). Check your blood glucose often when you are at risk for hypoglycemia.  Hypoglycemia can develop quickly, and it can be dangerous if it is not treated right away. If you have a history of severe hypoglycemia, make sure you know how to use your glucagon injection kit.  Make sure you know how to treat hypoglycemia. Keep a carbohydrate snack available when you may be at risk for hypoglycemia. This information is not intended to replace advice given to you by your health care provider. Make sure you discuss any questions you have with your health care provider. Document Revised: 03/04/2019 Document Reviewed: 07/08/2017 Elsevier Patient Education  [place].

## 2020-03-30 LAB — CBC WITH DIFFERENTIAL/PLATELET
Absolute Monocytes: 498 {cells}/uL (ref 200–950)
Basophils Absolute: 32 {cells}/uL (ref 0–200)
Basophils Relative: 0.6 %
Eosinophils Absolute: 138 {cells}/uL (ref 15–500)
Eosinophils Relative: 2.6 %
HCT: 44.4 % (ref 38.5–50.0)
Hemoglobin: 14.7 g/dL (ref 13.2–17.1)
Lymphs Abs: 1076 {cells}/uL (ref 850–3900)
MCH: 31 pg (ref 27.0–33.0)
MCHC: 33.1 g/dL (ref 32.0–36.0)
MCV: 93.7 fL (ref 80.0–100.0)
MPV: 12.1 fL (ref 7.5–12.5)
Monocytes Relative: 9.4 %
Neutro Abs: 3556 {cells}/uL (ref 1500–7800)
Neutrophils Relative %: 67.1 %
Platelets: 156 10*3/uL (ref 140–400)
RBC: 4.74 Million/uL (ref 4.20–5.80)
RDW: 13.1 % (ref 11.0–15.0)
Total Lymphocyte: 20.3 %
WBC: 5.3 10*3/uL (ref 3.8–10.8)

## 2020-03-30 LAB — LIPID PANEL
Cholesterol: 117 mg/dL
HDL: 33 mg/dL — ABNORMAL LOW
LDL Cholesterol (Calc): 64 mg/dL
Non-HDL Cholesterol (Calc): 84 mg/dL
Total CHOL/HDL Ratio: 3.5 (calc)
Triglycerides: 115 mg/dL

## 2020-03-30 LAB — COMPLETE METABOLIC PANEL WITHOUT GFR
AG Ratio: 1.8 (calc) (ref 1.0–2.5)
ALT: 14 U/L (ref 9–46)
AST: 12 U/L (ref 10–35)
Albumin: 3.9 g/dL (ref 3.6–5.1)
Alkaline phosphatase (APISO): 82 U/L (ref 35–144)
BUN: 12 mg/dL (ref 7–25)
CO2: 28 mmol/L (ref 20–32)
Calcium: 9.2 mg/dL (ref 8.6–10.3)
Chloride: 102 mmol/L (ref 98–110)
Creat: 0.96 mg/dL (ref 0.70–1.18)
GFR, Est African American: 88 mL/min/1.73m2
GFR, Est Non African American: 76 mL/min/1.73m2
Globulin: 2.2 g/dL (ref 1.9–3.7)
Glucose, Bld: 297 mg/dL — ABNORMAL HIGH (ref 65–99)
Potassium: 5.2 mmol/L (ref 3.5–5.3)
Sodium: 137 mmol/L (ref 135–146)
Total Bilirubin: 0.9 mg/dL (ref 0.2–1.2)
Total Protein: 6.1 g/dL (ref 6.1–8.1)

## 2020-03-30 LAB — HEMOGLOBIN A1C
Hgb A1c MFr Bld: 10.2 %{Hb} — ABNORMAL HIGH
Mean Plasma Glucose: 246 (calc)
eAG (mmol/L): 13.6 (calc)

## 2020-03-30 LAB — MAGNESIUM: Magnesium: 1.9 mg/dL (ref 1.5–2.5)

## 2020-03-30 LAB — TSH: TSH: 1.55 m[IU]/L (ref 0.40–4.50)

## 2020-04-16 ENCOUNTER — Encounter: Payer: Self-pay | Admitting: Internal Medicine

## 2020-04-17 ENCOUNTER — Other Ambulatory Visit: Payer: Self-pay

## 2020-04-17 ENCOUNTER — Ambulatory Visit (INDEPENDENT_AMBULATORY_CARE_PROVIDER_SITE_OTHER): Payer: PPO | Admitting: Pharmacist

## 2020-04-17 DIAGNOSIS — Z7901 Long term (current) use of anticoagulants: Secondary | ICD-10-CM | POA: Diagnosis not present

## 2020-04-17 DIAGNOSIS — Z952 Presence of prosthetic heart valve: Secondary | ICD-10-CM | POA: Diagnosis not present

## 2020-04-17 LAB — POCT INR: INR: 4 — AB (ref 2.0–3.0)

## 2020-05-03 ENCOUNTER — Other Ambulatory Visit: Payer: Self-pay

## 2020-05-03 ENCOUNTER — Ambulatory Visit (INDEPENDENT_AMBULATORY_CARE_PROVIDER_SITE_OTHER): Payer: PPO | Admitting: Adult Health

## 2020-05-03 ENCOUNTER — Encounter: Payer: Self-pay | Admitting: Adult Health

## 2020-05-03 VITALS — BP 142/80 | HR 97 | Temp 96.9°F | Ht 69.0 in | Wt 209.0 lb

## 2020-05-03 DIAGNOSIS — Z79899 Other long term (current) drug therapy: Secondary | ICD-10-CM | POA: Diagnosis not present

## 2020-05-03 DIAGNOSIS — E669 Obesity, unspecified: Secondary | ICD-10-CM | POA: Diagnosis not present

## 2020-05-03 DIAGNOSIS — E1121 Type 2 diabetes mellitus with diabetic nephropathy: Secondary | ICD-10-CM

## 2020-05-03 DIAGNOSIS — I10 Essential (primary) hypertension: Secondary | ICD-10-CM | POA: Diagnosis not present

## 2020-05-03 DIAGNOSIS — E1122 Type 2 diabetes mellitus with diabetic chronic kidney disease: Secondary | ICD-10-CM | POA: Diagnosis not present

## 2020-05-03 DIAGNOSIS — E785 Hyperlipidemia, unspecified: Secondary | ICD-10-CM | POA: Diagnosis not present

## 2020-05-03 DIAGNOSIS — E1169 Type 2 diabetes mellitus with other specified complication: Secondary | ICD-10-CM | POA: Diagnosis not present

## 2020-05-03 DIAGNOSIS — N182 Chronic kidney disease, stage 2 (mild): Secondary | ICD-10-CM

## 2020-05-03 DIAGNOSIS — E66811 Obesity, class 1: Secondary | ICD-10-CM

## 2020-05-03 MED ORDER — EMPAGLIFLOZIN 25 MG PO TABS: 25.0000 mg | ORAL_TABLET | Freq: Every day | ORAL | 0 refills | Status: DC

## 2020-05-03 NOTE — Progress Notes (Signed)
Diabetes Education and Follow-Up Visit  79 y.o.male presents for diabetic education. 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  A1C is way up - 10.2 at last visit,   Last hemoglobin A1c was: Lab Results  Component Value Date   HGBA1C 10.2 (H) 03/29/2020   HGBA1C 7.9 (H) 12/20/2019   HGBA1C 9.0 (H) 09/13/2019   BMI is Body mass index is 30.86 kg/m., he has been working on diet and exercise (active with his job, none intentionally). Reports was previously eating a bag of potato chips in the evening, stopped this and doing peanuts and popcorn instead (1.5-2 cups popped).  He has lost 3 lb since the last visit.  Wt Readings from Last 3 Encounters:  05/03/20 209 lb (94.8 kg)  03/29/20 212 lb (96.2 kg)  02/24/20 213 lb (96.6 kg)   Pt is on a regimen of: Takes metformin 2000 mg BID Glipizide 10 mg AM, 20 mg with lunch (largest meal of the day), 10 mg with dinner   He works as Geophysicist/field seismologist, needs to pass DOT physical, states cannot be on insulin to pass  He did not bring glucose or food log what was recommend    Pt checks his sugars 1 x day three days a week (checks 6 AM, fasting) Lowest sugar was 160.   Highest sugar was 220 (checked at 1-2 in AM), highest fasting was 180.  Glucometer: freestyle ?   Breakfast: instant oatmeal  Lunch: grilled protein with 2 veggies (green beans, pinto and colesalaw or potatoe salad  Dinner: leftovers from lunch, and snacks on popcorn and peanuts  Drinks: unsweet tea with lunch with splenda, at home drinks diet mountain dew (2 in the evening), admits no   Exercise: limited, active job   Patient does have CKD He is on ACE/ARB  Lab Results  Component Value Date   GFRNONAA 76 03/29/2020   Lab Results  Component Value Date   CREATININE 0.96 03/29/2020   BUN 12  03/29/2020   NA 137 03/29/2020   K 5.2 03/29/2020   CL 102 03/29/2020   CO2 28 03/29/2020   Lab Results  Component Value Date   MICROALBUR 2.7 12/20/2019    He is on a Statin.  He is at goal of less than 70.  Lab Results  Component Value Date   CHOL 117 03/29/2020   HDL 33 (L) 03/29/2020   LDLCALC 64 03/29/2020   TRIG 115 03/29/2020   CHOLHDL 3.5 03/29/2020     Problem List has Long term current use of anticoagulant therapy; H/O mechanical aortic valve replacement; Hyperlipidemia associated with type 2 diabetes mellitus ([place]); Type II diabetes mellitus with nephropathy ([place]); Vitamin D deficiency; Vitamin B12 deficiency; Essential hypertension; Medication management; Coronary artery disease due to lipid rich plaque; Hx of CABG; Encounter for Medicare annual wellness exam; Obesity (BMI 30.0-34.9); CKD stage 2 due to type 2 diabetes mellitus ([place]); Atrial fibrillation ([place]); GERD (gastroesophageal reflux disease); and Ascending aortic aneurysm (HCC) on their problem list.  Medications Current Outpatient Medications on File Prior to Visit  Medication Sig  . aspirin 81 MG tablet Take 81 mg by mouth daily.    Marland Kitchen CINNAMON PO Take 1,000 mg by mouth 3 (three) times daily.   . clotrimazole-betamethasone (LOTRISONE) cream Apply to rash 2 to 3 x /day  . cyanocobalamin (,VITAMIN B-12,) 1000  MCG/ML injection INJECT 1/2 ML EVERY MONTH  . enalapril (VASOTEC) 20 MG tablet TAKE 2 TABLETS BY MOUTH EVERY DAY  . famotidine (PEPCID) 20 MG tablet TAKE 1 TABLET 2 X /DAY WITH MEALS FOR ACID INDIGESTION  . glipiZIDE (GLUCOTROL) 10 MG tablet Take 10 mg by mouth 2 (two) times daily before a meal. TAKE 1 TABLRT AT BREAKFAST, 2 AT LUNCH, 1 AT SUPPERER  . glucose blood (ONE TOUCH ULTRA TEST) test strip CHECK BLOOD SUGAR ONCE DAILY  . metFORMIN (GLUCOPHAGE-XR) 500 MG 24 hr tablet Take 2 tablets 2 x /day with Meals for Diabetes  . metoprolol succinate (TOPROL-XL) 50 MG 24 hr tablet TAKE 1 TABLET (50 MG TOTAL) BY  MOUTH DAILY. TAKE WITH OR IMMEDIATELY FOLLOWING A MEAL.  . simvastatin (ZOCOR) 40 MG tablet Take 1 tablet at Bedtime for Cholesterol  . warfarin (COUMADIN) 5 MG tablet TAKE 1/2 TO 1 TABLET BY MOUTH DAILY OR AS DIRECTED  . CHOLECALCIFEROL PO Take 5,000 Units by mouth daily. Takes 2-3 days per week currently  (Patient not taking: Reported on 05/03/2020)   No current facility-administered medications on file prior to visit.    ROS- see HPI  Physical Exam: Blood pressure (!) 142/80, pulse 97, temperature (!) 96.9 F (36.1 C), height 5\' 9"  (1.753 m), weight 209 lb (94.8 kg), SpO2 99 %. Body mass index is 30.86 kg/m. General Appearance: Well nourished, in no apparent distress. Eyes: PERRLA, EOMs, conjunctiva no swelling or erythema ENT/Mouth: Ext aud canals clear, TMs without erythema, bulging. No erythema, swelling, or exudate on post pharynx.  Tonsils not swollen or erythematous. Hearing normal.  Respiratory: Respiratory effort normal, BS equal bilaterally without rales, rhonchi, wheezing or stridor.  Cardio: RRR with no MRGs. Brisk peripheral pulses without edema.  Abdomen: Soft, + BS.  Non tender, no guarding, rebound, hernias, masses. Musculoskeletal: Full ROM, 5/5 strength, normal gait.  Skin: Warm, dry without rashes, lesions, ecchymosis.  Neuro: Cranial nerves intact. Normal muscle tone, no cerebellar symptoms. Sensation intact.    Plan and Assessment: Diabetes Education: Reviewed 'ABCs' of diabetes management (respective goals in parentheses):  A1C (<8), blood pressure (<130/80), and cholesterol (LDL <70), fasting (<130) Eye Exam yearly and Dental Exam every 6 months. Dietary recommendations - suggested old fashioned oats, alternatives to pop corn, potato salad Physical Activity recommendations - Strongly advised him to start checking sugars at different times of the day - check 2 times a day, rotating checks - given sugar log and advised how to fill it and to bring it at next appt   - given foot care handout and explained the principles  - given instructions for hypoglycemia management  - given jardiance samples - 10 mg x 1 week, 25 mg x 3 weeks, will refill script if tolerating well  - follow up 1 month   Future Appointments  Date Time Provider [place]  05/22/2020 10:00 AM [facility]  07/05/2020 11:30 AM Unk Pinto, MD [facility] None  01/03/2021 11:00 AM Unk Pinto, MD [facility] None  04/09/2021  2:00 PM Liane Comber, NP [facility] None

## 2020-05-03 NOTE — Patient Instructions (Addendum)
Goals    . Blood Pressure < 120/80    . HEMOGLOBIN A1C < 8    . LDL CALC < 70    . Weight (lb) < 200 lb (90.7 kg)      Goal is fasting <130  Try cutting down on diet soda  Try stevia or truvia instead of splenda - better for blood sugars  Try avoiding potato salad or do very small portion, larger portion of green beans, coleslaw, or green salad, broccoli)   Try switching out popcorn snacking for - raw vegetables with hummus or peanut butter   Try old fashioned oat meal instead of instant - can add fruit (berries, peaches, banana slices, apples with cinnamon), nut butters or chopped nuts     Empagliflozin oral tablets What is this medicine? EMPAGLIFLOZIN (EM pa gli FLOE zin) helps to treat type 2 diabetes. It helps to control blood sugar. This drug may also reduce the risk of heart attack or stroke if you have type 2 diabetes and risk factors for heart disease. Treatment is combined with diet and exercise. This medicine may be used for other purposes; ask your health care provider or pharmacist if you have questions. COMMON BRAND NAME(S): Jardiance What should I tell my health care provider before I take this medicine? They need to know if you have any of these conditions:  dehydration  diabetic ketoacidosis  diet low in salt  eating less due to illness, surgery, dieting, or any other reason  having surgery  high cholesterol  high levels of potassium in the blood  history of pancreatitis or pancreas problems  history of yeast infection of the penis or vagina  if you often drink alcohol  infections in the bladder, kidneys, or urinary tract  kidney disease  liver disease  low blood pressure  on hemodialysis  problems urinating  type 1 diabetes  uncircumcised male  an unusual or allergic reaction to empagliflozin, other medicines, foods, dyes, or preservatives  pregnant or trying to get pregnant  breast-feeding How should I use this medicine? Take  this medicine by mouth with a glass of water. Follow the directions on the prescription label. Take it in the morning, with or without food. Take your dose at the same time each day. Do not take more often than directed. Do not stop taking except on your doctor's advice. Talk to your pediatrician regarding the use of this medicine in children. Special care may be needed. Overdosage: If you think you have taken too much of this medicine contact a poison control center or emergency room at once. NOTE: This medicine is only for you. Do not share this medicine with others. What if I miss a dose? If you miss a dose, take it as soon as you can. If it is almost time for your next dose, take only that dose. Do not take double or extra doses. What may interact with this medicine? Do not take this medicine with any of the following medications:  gatifloxacin This medicine may also interact with the following medications:  alcohol  certain medicines for blood pressure, heart disease  diuretics This list may not describe all possible interactions. Give your health care provider a list of all the medicines, herbs, non-prescription drugs, or dietary supplements you use. Also tell them if you smoke, drink alcohol, or use illegal drugs. Some items may interact with your medicine. What should I watch for while using this medicine? Visit your doctor or health care professional for  regular checks on your progress. This medicine can cause a serious condition in which there is too much acid in the blood. If you develop nausea, vomiting, stomach pain, unusual tiredness, or breathing problems, stop taking this medicine and call your doctor right away. If possible, use a ketone dipstick to check for ketones in your urine. A test called the HbA1C (A1C) will be monitored. This is a simple blood test. It measures your blood sugar control over the last 2 to 3 months. You will receive this test every 3 to 6 months. Learn  how to check your blood sugar. Learn the symptoms of low and high blood sugar and how to manage them. Always carry a quick-source of sugar with you in case you have symptoms of low blood sugar. Examples include hard sugar candy or glucose tablets. Make sure others know that you can choke if you eat or drink when you develop serious symptoms of low blood sugar, such as seizures or unconsciousness. They must get medical help at once. Tell your doctor or health care professional if you have high blood sugar. You might need to change the dose of your medicine. If you are sick or exercising more than usual, you might need to change the dose of your medicine. Do not skip meals. Ask your doctor or health care professional if you should avoid alcohol. Many nonprescription cough and cold products contain sugar or alcohol. These can affect blood sugar. Wear a medical ID bracelet or chain, and carry a card that describes your disease and details of your medicine and dosage times. What side effects may I notice from receiving this medicine? Side effects that you should report to your doctor or health care professional as soon as possible:  allergic reactions like skin rash, itching or hives, swelling of the face, lips, or tongue  breathing problems  dizziness  feeling faint or lightheaded, falls  muscle weakness  nausea, vomiting, unusual stomach upset or pain  penile discharge, itching, or pain in men  signs and symptoms of a genital infection, such as fever; tenderness, redness, or swelling in the genitals or area from the genitals to the back of the rectum  signs and symptoms of low blood sugar such as feeling anxious, confusion, dizziness, increased hunger, unusually weak or tired, sweating, shakiness, cold, irritable, headache, blurred vision, fast heartbeat, loss of consciousness  signs and symptoms of a urinary tract infection, such as fever, chills, a burning feeling when urinating, blood in  the urine, back pain  trouble passing urine or change in the amount of urine, including an urgent need to urinate more often, in larger amounts, or at night  unusual tiredness  vaginal discharge, itching, or odor in women Side effects that usually do not require medical attention (report to your doctor or health care professional if they continue or are bothersome):  mild increase in urination  thirsty This list may not describe all possible side effects. Call your doctor for medical advice about side effects. You may report side effects to FDA at 1-800-FDA-1088. Where should I keep my medicine? Keep out of the reach of children. Store at room temperature between 20 and 25 degrees C (68 and 77 degrees F). Throw away any unused medicine after the expiration date. NOTE: This sheet is a summary. It may not cover all possible information. If you have questions about this medicine, talk to your doctor, pharmacist, or health care provider.  2020 Elsevier/Gold Standard (2017-07-23 10:25:34)

## 2020-05-10 ENCOUNTER — Other Ambulatory Visit: Payer: Self-pay | Admitting: Internal Medicine

## 2020-05-10 LAB — FRUCTOSAMINE: Fructosamine: 324 umol/L — ABNORMAL HIGH (ref 205–285)

## 2020-05-22 ENCOUNTER — Other Ambulatory Visit: Payer: Self-pay

## 2020-05-22 ENCOUNTER — Ambulatory Visit (INDEPENDENT_AMBULATORY_CARE_PROVIDER_SITE_OTHER): Payer: PPO | Admitting: Pharmacist Clinician (PhC)/ Clinical Pharmacy Specialist

## 2020-05-22 DIAGNOSIS — Z7901 Long term (current) use of anticoagulants: Secondary | ICD-10-CM

## 2020-05-22 DIAGNOSIS — Z952 Presence of prosthetic heart valve: Secondary | ICD-10-CM

## 2020-05-22 DIAGNOSIS — I4891 Unspecified atrial fibrillation: Secondary | ICD-10-CM

## 2020-05-22 LAB — POCT INR: INR: 2.8 (ref 2.0–3.0)

## 2020-05-23 ENCOUNTER — Encounter: Payer: Self-pay | Admitting: Internal Medicine

## 2020-05-23 ENCOUNTER — Telehealth: Payer: Self-pay

## 2020-05-23 NOTE — Telephone Encounter (Signed)
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

## 2020-05-29 ENCOUNTER — Other Ambulatory Visit: Payer: Self-pay

## 2020-05-29 MED ORDER — EMPAGLIFLOZIN 25 MG PO TABS: 25.0000 mg | ORAL_TABLET | Freq: Every day | ORAL | 1 refills | Status: DC

## 2020-06-18 ENCOUNTER — Other Ambulatory Visit: Payer: Self-pay | Admitting: Internal Medicine

## 2020-06-18 DIAGNOSIS — Z79899 Other long term (current) drug therapy: Secondary | ICD-10-CM

## 2020-06-19 ENCOUNTER — Ambulatory Visit (INDEPENDENT_AMBULATORY_CARE_PROVIDER_SITE_OTHER): Payer: PPO | Admitting: Pharmacist

## 2020-06-19 ENCOUNTER — Other Ambulatory Visit: Payer: Self-pay

## 2020-06-19 ENCOUNTER — Telehealth: Payer: Self-pay | Admitting: Internal Medicine

## 2020-06-19 DIAGNOSIS — Z952 Presence of prosthetic heart valve: Secondary | ICD-10-CM | POA: Diagnosis not present

## 2020-06-19 DIAGNOSIS — Z7901 Long term (current) use of anticoagulants: Secondary | ICD-10-CM | POA: Diagnosis not present

## 2020-06-19 LAB — POCT INR: INR: 3.5 — AB (ref 2.0–3.0)

## 2020-06-19 NOTE — Telephone Encounter (Signed)
Spoke with patient regarding appointment for CTA chest/aorta scheduled Thursday 06/28/20 at 2:30 pm at [facility] ---arrival time is 2:15 pm 1st floor radiology department---liquids only 4 hours prior to study.  Patient voiced his understanding.

## 2020-06-25 ENCOUNTER — Telehealth: Payer: Self-pay | Admitting: Internal Medicine

## 2020-06-25 ENCOUNTER — Other Ambulatory Visit: Payer: Self-pay

## 2020-06-25 DIAGNOSIS — Z01812 Encounter for preprocedural laboratory examination: Secondary | ICD-10-CM

## 2020-06-25 NOTE — Telephone Encounter (Signed)
Called and spoke with Tedra Coupe with [facility]. She is asking that the BMET order be changed to an I-STAT Creatinine. She reports that they cannot draw a BMET in their lab but they could do the I-STAT Creatinine prior to his CT. Placed the order for I-STAT Creatinine.

## 2020-06-25 NOTE — Telephone Encounter (Signed)
Vanda with [facility] has questions in regards to active orders for BMET. She states she is requesting to speak with a nurse regarding whether or not patient is eligible to have additional labs completed. Please return call to discuss at [phone].

## 2020-06-28 ENCOUNTER — Encounter (HOSPITAL_COMMUNITY): Payer: Self-pay

## 2020-06-28 ENCOUNTER — Other Ambulatory Visit: Payer: Self-pay

## 2020-06-28 ENCOUNTER — Ambulatory Visit (HOSPITAL_COMMUNITY)
Admission: RE | Admit: 2020-06-28 | Discharge: 2020-06-28 | Disposition: A | Discharge status: Home / Self Care | Payer: PPO | Source: Ambulatory Visit | Attending: Internal Medicine | Admitting: Internal Medicine

## 2020-06-28 DIAGNOSIS — I712 Thoracic aortic aneurysm, without rupture: Secondary | ICD-10-CM | POA: Diagnosis not present

## 2020-06-28 DIAGNOSIS — I77810 Thoracic aortic ectasia: Secondary | ICD-10-CM | POA: Diagnosis present

## 2020-06-28 LAB — POCT I-STAT CREATININE: Creatinine, Ser: 0.8 mg/dL (ref 0.61–1.24)

## 2020-06-28 MED ORDER — IOHEXOL 350 MG/ML SOLN
100.0000 mL | Freq: Once | INTRAVENOUS | Status: AC | PRN
  Administered 2020-06-28: 100 mL via INTRAVENOUS

## 2020-06-28 MED ORDER — SODIUM CHLORIDE (PF) 0.9 % IJ SOLN
INTRAMUSCULAR | Status: AC
  Filled 2020-06-28: qty 50

## 2020-06-29 ENCOUNTER — Other Ambulatory Visit: Payer: Self-pay | Admitting: *Deleted

## 2020-06-29 DIAGNOSIS — I712 Thoracic aortic aneurysm, without rupture, unspecified: Secondary | ICD-10-CM

## 2020-06-29 DIAGNOSIS — I77810 Thoracic aortic ectasia: Secondary | ICD-10-CM

## 2020-07-03 ENCOUNTER — Ambulatory Visit: Payer: PPO | Admitting: Podiatry

## 2020-07-05 ENCOUNTER — Other Ambulatory Visit: Payer: Self-pay

## 2020-07-05 ENCOUNTER — Ambulatory Visit (INDEPENDENT_AMBULATORY_CARE_PROVIDER_SITE_OTHER): Payer: PPO | Admitting: Internal Medicine

## 2020-07-05 ENCOUNTER — Encounter: Payer: Self-pay | Admitting: Internal Medicine

## 2020-07-05 VITALS — BP 124/66 | HR 76 | Temp 97.4°F | Resp 16 | Ht 69.0 in | Wt 207.0 lb

## 2020-07-05 DIAGNOSIS — E1169 Type 2 diabetes mellitus with other specified complication: Secondary | ICD-10-CM | POA: Diagnosis not present

## 2020-07-05 DIAGNOSIS — E785 Hyperlipidemia, unspecified: Secondary | ICD-10-CM | POA: Diagnosis not present

## 2020-07-05 DIAGNOSIS — E1122 Type 2 diabetes mellitus with diabetic chronic kidney disease: Secondary | ICD-10-CM

## 2020-07-05 DIAGNOSIS — Z79899 Other long term (current) drug therapy: Secondary | ICD-10-CM

## 2020-07-05 DIAGNOSIS — I4891 Unspecified atrial fibrillation: Secondary | ICD-10-CM | POA: Diagnosis not present

## 2020-07-05 DIAGNOSIS — Z951 Presence of aortocoronary bypass graft: Secondary | ICD-10-CM

## 2020-07-05 DIAGNOSIS — E559 Vitamin D deficiency, unspecified: Secondary | ICD-10-CM

## 2020-07-05 DIAGNOSIS — N182 Chronic kidney disease, stage 2 (mild): Secondary | ICD-10-CM

## 2020-07-05 DIAGNOSIS — I10 Essential (primary) hypertension: Secondary | ICD-10-CM

## 2020-07-05 NOTE — Progress Notes (Signed)
History of Present Illness:       This very nice 79 y.o.  MWM presents for 6 month follow up with HTN, HLD, Pre-Diabetes and Vitamin D Deficiency.       Patient is treated for HTN (1994)  & BP has been controlled at home. Today's BP is at goal -  124/66.  Patient has hx/o CABG & St Jude mech AoVR (2004) & is followed ay [facility]. In 2016, he had a negative Myoview by Dr Dollene Primrose.  Patient has had no complaints of any cardiac type chest pain, palpitations, dyspnea / orthopnea / PND, dizziness, claudication, or dependent edema.      Hyperlipidemia is controlled with diet & meds. Patient denies myalgias or other med SE's. Last Lipids were at goal:  Lab Results  Component Value Date   CHOL 117 03/29/2020   HDL 33 (L) 03/29/2020   LDLCALC 64 03/29/2020   TRIG 115 03/29/2020   CHOLHDL 3.5 03/29/2020    Also, the patient has history of T2_NIDDM (2004) /CKD2 (GFR 89)  and has had no symptoms of reactive hypoglycemia, diabetic polys, paresthesias or visual blurring.  Last A1c was not at goal:  Lab Results  Component Value Date   HGBA1C 10.2 (H) 03/29/2020           Further, the patient also has history of Vitamin D Deficiency("28" / 2012)  and supplements vitamin D without any suspected side-effects. Last vitamin D was still low:  Lab Results  Component Value Date   VD25OH 26 (L) 12/20/2019    Current Outpatient Medications on File Prior to Visit  Medication Sig  . aspirin 81 MG tablet Take 81 mg by mouth daily.    . CHOLECALCIFEROL PO Take 5,000 Units by mouth daily. Takes 2-3 days per week currently   . CINNAMON PO Take 1,000 mg by mouth in the morning and at bedtime.   . clotrimazole-betamethasone (LOTRISONE) cream Apply to rash 2 to 3 x /day  . cyanocobalamin (,VITAMIN B-12,) 1000 MCG/ML injection INJECT 1/2 ML EVERY MONTH  . empagliflozin (JARDIANCE) 25 MG TABS tablet Take 1 tablet (25 mg total) by mouth daily before breakfast.  . enalapril (VASOTEC) 20 MG  tablet TAKE 2 TABLETS BY MOUTH EVERY DAY  . famotidine (PEPCID) 20 MG tablet TAKE 1 TABLET 2 X /DAY WITH MEALS FOR ACID INDIGESTION  . glipiZIDE (GLUCOTROL) 10 MG tablet Take 10 mg by mouth 2 (two) times daily before a meal. TAKE 1 TABLRT AT BREAKFAST, 2 AT LUNCH, 1 AT SUPPERER  . glucose blood (ONE TOUCH ULTRA TEST) test strip CHECK BLOOD SUGAR ONCE DAILY  . metFORMIN (GLUCOPHAGE-XR) 500 MG 24 hr tablet Take 2 tablets 2 x /day with Meals for Diabetes  . metoprolol succinate (TOPROL-XL) 50 MG 24 hr tablet TAKE 1 TABLET (50 MG TOTAL) BY MOUTH DAILY. TAKE WITH OR IMMEDIATELY FOLLOWING A MEAL.  . simvastatin (ZOCOR) 40 MG tablet Take 1 tablet at Bedtime for Cholesterol  . warfarin (COUMADIN) 5 MG tablet TAKE 1/2 TO 1 TABLET BY MOUTH DAILY OR AS DIRECTED   No current facility-administered medications on file prior to visit.    No Known Allergies  PMHx:   Past Medical History:  Diagnosis Date  . Anemia   . AS (aortic stenosis)    valve replacement-st. jude mechanical valve  . ASHD (arteriosclerotic heart disease)   . CAD (coronary artery disease), native coronary artery    CABG 07/18/2003  . 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 GERD (gastroesophageal reflux disease)   . History of nuclear stress test 02/12/10   EF 67%, normal perfusion  . Hyperlipidemia   . Hypertension   . Morbid obesity ([place])   . Type II or unspecified type diabetes mellitus without mention of complication, not stated as uncontrolled   . Umbilical hernia   . Vitamin B12 deficiency   . Vitamin D deficiency     Immunization History  Administered Date(s) Administered  . Influenza Whole 09/01/2012  . Influenza, High Dose Seasonal PF 09/27/2015  . Influenza-Unspecified 08/25/2016, 08/24/2018, 09/13/2019  . PFIZER SARS-COV-2 Vaccination 03/18/2020, 04/13/2020  . Pneumococcal Conjugate-13 12/18/2015  . Pneumococcal Polysaccharide-23 09/01/2012  . Tdap 11/25/2007    Past Surgical History:  Procedure Laterality Date  .  AORTIC VALVE REPLACEMENT  07/18/2003   52mm ATS-AP valve  . AORTIC VALVE REPLACEMENT (AVR)/CORONARY ARTERY BYPASS GRAFTING (CABG)  07/18/2003   AVR; bypass x 2-SVG to first diag, SVG to posterior descending  . CARDIAC CATHETERIZATION  07/13/2003   AS-aortic valve gradient 40mmhg, valve area 1.1; obstruction of ostium of posterior lateral branch with obstruction in the ostium of first diagnonal  . HERNIA REPAIR  24/58/0998   RIH, umbilical  . NM MYOCAR PERF WALL MOTION  02/12/2010   protocol:[place]ruce, EF 67%, exercise cap 7 METS, low risk scan  . TEE WITHOUT CARDIOVERSION  07/18/2003   no evidence of PE, Aortic valve heavily calcified,   . TRANSTHORACIC ECHOCARDIOGRAM  02/21/2013   EF60-65%, Septal motion showed paradox. atrium moderately dilated    FHx:    Reviewed / unchanged  SHx:    Reviewed / unchanged   Systems Review:  Constitutional: Denies fever, chills, wt changes, headaches, insomnia, fatigue, night sweats, change in appetite. Eyes: Denies redness, blurred vision, diplopia, discharge, itchy, watery eyes.  ENT: Denies discharge, congestion, post nasal drip, epistaxis, sore throat, earache, hearing loss, dental pain, tinnitus, vertigo, sinus pain, snoring.  CV: Denies chest pain, palpitations, irregular heartbeat, syncope, dyspnea, diaphoresis, orthopnea, PND, claudication or edema. Respiratory: denies cough, dyspnea, DOE, pleurisy, hoarseness, laryngitis, wheezing.  Gastrointestinal: Denies dysphagia, odynophagia, heartburn, reflux, water brash, abdominal pain or cramps, nausea, vomiting, bloating, diarrhea, constipation, hematemesis, melena, hematochezia  or hemorrhoids. Genitourinary: Denies dysuria, frequency, urgency, nocturia, hesitancy, discharge, hematuria or flank pain. Musculoskeletal: Denies arthralgias, myalgias, stiffness, jt. swelling, pain, limping or strain/sprain.  Skin: Denies pruritus, rash, hives, warts, acne, eczema or change in skin lesion(s). Neuro: No  weakness, tremor, incoordination, spasms, paresthesia or pain. Psychiatric: Denies confusion, memory loss or sensory loss. Endo: Denies change in weight, skin or hair change.  Heme/Lymph: No excessive bleeding, bruising or enlarged lymph nodes.  Physical Exam  BP 124/66   Pulse 76   Temp (!) 97.4 F (36.3 C)   Resp 16   Ht 5\' 9"  (1.753 m)   Wt 207 lb (93.9 kg)   BMI 30.57 kg/m   Appears  well nourished, well groomed  and in no distress.  Eyes: PERRLA, EOMs, conjunctiva no swelling or erythema. Sinuses: No frontal/maxillary tenderness ENT/Mouth: EAC's clear, TM's nl w/o erythema, bulging. Nares clear w/o erythema, swelling, exudates. Oropharynx clear without erythema or exudates. Oral hygiene is good. Tongue normal, non obstructing. Hearing intact.  Neck: Supple. Thyroid not palpable. Car 2+/2+ without bruits, nodes or JVD. Chest: Respirations nl with BS clear & equal w/o rales, rhonchi, wheezing or stridor.  Cor: Heart sounds with mechanical valve clicks & regular rate and rhythm without sig. Murmur. Peripheral pulses  normal and equal  without edema.  Abdomen: Soft & bowel sounds normal. Non-tender w/o guarding, rebound, hernias, masses or organomegaly.  Lymphatics: Unremarkable.  Musculoskeletal: Full ROM all peripheral extremities, joint stability, 5/5 strength and normal gait.  Skin: Warm, dry without exposed rashes, lesions or ecchymosis apparent.  Neuro: Cranial nerves intact, reflexes equal bilaterally. Sensory-motor testing grossly intact. Tendon reflexes grossly intact.  Pysch: Alert & oriented x 3.  Insight and judgement nl & appropriate. No ideations.  Assessment and Plan:  1. Essential hypertension  - Continue medication, monitor blood pressure at home.  - Continue DASH diet.  Reminder to go to the ER if any CP,  SOB, nausea, dizziness, severe HA, changes vision/speech.  - CBC with Differential/Platelet - COMPLETE METABOLIC PANEL WITH GFR - Magnesium - TSH  2.  Hyperlipidemia associated with type 2 diabetes mellitus ([place])  - Continue diet/meds, exercise,& lifestyle modifications.  - Continue monitor periodic cholesterol/liver & renal functions   - Lipid panel - TSH  3. Type 2 diabetes mellitus with stage 2 chronic kidney disease,  without long-term current use of insulin (HCC)  - Continue diet, exercise  - Lifestyle modifications.  - Monitor appropriate labs.  - Hemoglobin A1c - Insulin, random  4. Vitamin D deficiency  - Continue supplementation.  - VITAMIN D 25 Hydroxy  5. Hx of CABG  - Lipid panel  6. Atrial fibrillation, unspecified type (HCC)  - TSH  7. Medication management  - CBC with Differential/Platelet - COMPLETE METABOLIC PANEL WITH GFR - Magnesium - Lipid panel - TSH - Hemoglobin A1c - Insulin, random - VITAMIN D 25 Hydroxy        Discussed  regular exercise, BP monitoring, weight control to achieve/maintain BMI less than 25 and discussed med and SE's. Recommended labs to assess and monitor clinical status with further disposition pending results of labs.  I discussed the assessment and treatment plan with the patient. The patient was provided an opportunity to ask questions and all were answered. The patient agreed with the plan and demonstrated an understanding of the instructions.  I provided over 30 minutes of exam, counseling, chart review and  complex critical decision making.    Kirtland Bouchard, MD

## 2020-07-05 NOTE — Patient Instructions (Signed)

## 2020-07-06 LAB — MAGNESIUM: Magnesium: 2.1 mg/dL (ref 1.5–2.5)

## 2020-07-06 LAB — COMPLETE METABOLIC PANEL WITHOUT GFR
AG Ratio: 1.8 (calc) (ref 1.0–2.5)
ALT: 15 U/L (ref 9–46)
AST: 14 U/L (ref 10–35)
Albumin: 4.2 g/dL (ref 3.6–5.1)
Alkaline phosphatase (APISO): 96 U/L (ref 35–144)
BUN: 17 mg/dL (ref 7–25)
CO2: 24 mmol/L (ref 20–32)
Calcium: 9.6 mg/dL (ref 8.6–10.3)
Chloride: 104 mmol/L (ref 98–110)
Creat: 0.85 mg/dL (ref 0.70–1.18)
GFR, Est African American: 97 mL/min/{1.73_m2}
GFR, Est Non African American: 84 mL/min/{1.73_m2}
Globulin: 2.4 g/dL (ref 1.9–3.7)
Glucose, Bld: 178 mg/dL — ABNORMAL HIGH (ref 65–99)
Potassium: 4.8 mmol/L (ref 3.5–5.3)
Sodium: 140 mmol/L (ref 135–146)
Total Bilirubin: 0.9 mg/dL (ref 0.2–1.2)
Total Protein: 6.6 g/dL (ref 6.1–8.1)

## 2020-07-06 LAB — LIPID PANEL
Cholesterol: 147 mg/dL
HDL: 40 mg/dL
LDL Cholesterol (Calc): 81 mg/dL
Non-HDL Cholesterol (Calc): 107 mg/dL
Total CHOL/HDL Ratio: 3.7 (calc)
Triglycerides: 154 mg/dL — ABNORMAL HIGH

## 2020-07-06 LAB — VITAMIN D 25 HYDROXY (VIT D DEFICIENCY, FRACTURES): Vit D, 25-Hydroxy: 25 ng/mL — ABNORMAL LOW (ref 30–100)

## 2020-07-06 LAB — CBC WITH DIFFERENTIAL/PLATELET
Absolute Monocytes: 683 {cells}/uL (ref 200–950)
Basophils Absolute: 47 {cells}/uL (ref 0–200)
Basophils Relative: 0.7 %
Eosinophils Absolute: 168 {cells}/uL (ref 15–500)
Eosinophils Relative: 2.5 %
HCT: 47.8 % (ref 38.5–50.0)
Hemoglobin: 15.9 g/dL (ref 13.2–17.1)
Lymphs Abs: 1481 {cells}/uL (ref 850–3900)
MCH: 31.4 pg (ref 27.0–33.0)
MCHC: 33.3 g/dL (ref 32.0–36.0)
MCV: 94.3 fL (ref 80.0–100.0)
MPV: 11.8 fL (ref 7.5–12.5)
Monocytes Relative: 10.2 %
Neutro Abs: 4322 {cells}/uL (ref 1500–7800)
Neutrophils Relative %: 64.5 %
Platelets: 151 10*3/uL (ref 140–400)
RBC: 5.07 Million/uL (ref 4.20–5.80)
RDW: 13.1 % (ref 11.0–15.0)
Total Lymphocyte: 22.1 %
WBC: 6.7 10*3/uL (ref 3.8–10.8)

## 2020-07-06 LAB — INSULIN, RANDOM: Insulin: 15.8 u[IU]/mL

## 2020-07-06 LAB — HEMOGLOBIN A1C
Hgb A1c MFr Bld: 7.9 %{Hb} — ABNORMAL HIGH
Mean Plasma Glucose: 180 (calc)
eAG (mmol/L): 10 (calc)

## 2020-07-06 LAB — TSH: TSH: 1.42 m[IU]/L (ref 0.40–4.50)

## 2020-07-24 ENCOUNTER — Ambulatory Visit (INDEPENDENT_AMBULATORY_CARE_PROVIDER_SITE_OTHER): Payer: PPO | Admitting: Pharmacist Clinician (PhC)/ Clinical Pharmacy Specialist

## 2020-07-24 ENCOUNTER — Other Ambulatory Visit: Payer: Self-pay

## 2020-07-24 DIAGNOSIS — Z952 Presence of prosthetic heart valve: Secondary | ICD-10-CM

## 2020-07-24 DIAGNOSIS — Z7901 Long term (current) use of anticoagulants: Secondary | ICD-10-CM

## 2020-07-24 LAB — POCT INR: INR: 2.4 (ref 2.0–3.0)

## 2020-08-21 ENCOUNTER — Other Ambulatory Visit: Payer: Self-pay

## 2020-08-21 ENCOUNTER — Ambulatory Visit (INDEPENDENT_AMBULATORY_CARE_PROVIDER_SITE_OTHER): Payer: PPO | Admitting: Pharmacist Clinician (PhC)/ Clinical Pharmacy Specialist

## 2020-08-21 DIAGNOSIS — Z952 Presence of prosthetic heart valve: Secondary | ICD-10-CM

## 2020-08-21 DIAGNOSIS — Z7901 Long term (current) use of anticoagulants: Secondary | ICD-10-CM | POA: Diagnosis not present

## 2020-08-21 LAB — POCT INR: INR: 3 (ref 2.0–3.0)

## 2020-09-07 ENCOUNTER — Other Ambulatory Visit: Payer: Self-pay | Admitting: Internal Medicine

## 2020-09-11 ENCOUNTER — Other Ambulatory Visit: Payer: Self-pay | Admitting: Internal Medicine

## 2020-09-11 NOTE — Telephone Encounter (Signed)
Rx has been sent to the pharmacy electronically. ° °

## 2020-09-18 ENCOUNTER — Ambulatory Visit (INDEPENDENT_AMBULATORY_CARE_PROVIDER_SITE_OTHER): Payer: PPO | Admitting: Pharmacist

## 2020-09-18 ENCOUNTER — Other Ambulatory Visit: Payer: Self-pay

## 2020-09-18 DIAGNOSIS — Z952 Presence of prosthetic heart valve: Secondary | ICD-10-CM | POA: Diagnosis not present

## 2020-09-18 DIAGNOSIS — Z7901 Long term (current) use of anticoagulants: Secondary | ICD-10-CM | POA: Diagnosis not present

## 2020-09-18 LAB — POCT INR: INR: 2.2 (ref 2.0–3.0)

## 2020-09-20 ENCOUNTER — Other Ambulatory Visit: Payer: Self-pay | Admitting: Internal Medicine

## 2020-09-20 MED ORDER — METFORMIN HCL ER 500 MG PO TB24: ORAL_TABLET | ORAL | 0 refills | Status: DC

## 2020-10-11 ENCOUNTER — Encounter: Payer: Self-pay | Admitting: Adult Health Nurse Practitioner

## 2020-10-11 ENCOUNTER — Ambulatory Visit (INDEPENDENT_AMBULATORY_CARE_PROVIDER_SITE_OTHER): Payer: PPO | Admitting: Adult Health Nurse Practitioner

## 2020-10-11 ENCOUNTER — Other Ambulatory Visit: Payer: Self-pay

## 2020-10-11 VITALS — BP 130/76 | HR 73 | Temp 97.2°F | Resp 16 | Ht 69.0 in | Wt 207.2 lb

## 2020-10-11 DIAGNOSIS — E1121 Type 2 diabetes mellitus with diabetic nephropathy: Secondary | ICD-10-CM

## 2020-10-11 DIAGNOSIS — E1122 Type 2 diabetes mellitus with diabetic chronic kidney disease: Secondary | ICD-10-CM | POA: Diagnosis not present

## 2020-10-11 DIAGNOSIS — E66811 Obesity, class 1: Secondary | ICD-10-CM

## 2020-10-11 DIAGNOSIS — E559 Vitamin D deficiency, unspecified: Secondary | ICD-10-CM

## 2020-10-11 DIAGNOSIS — I10 Essential (primary) hypertension: Secondary | ICD-10-CM

## 2020-10-11 DIAGNOSIS — E669 Obesity, unspecified: Secondary | ICD-10-CM | POA: Diagnosis not present

## 2020-10-11 DIAGNOSIS — Z79899 Other long term (current) drug therapy: Secondary | ICD-10-CM

## 2020-10-11 DIAGNOSIS — I2510 Atherosclerotic heart disease of native coronary artery without angina pectoris: Secondary | ICD-10-CM | POA: Diagnosis not present

## 2020-10-11 DIAGNOSIS — N182 Chronic kidney disease, stage 2 (mild): Secondary | ICD-10-CM

## 2020-10-11 DIAGNOSIS — I4891 Unspecified atrial fibrillation: Secondary | ICD-10-CM | POA: Diagnosis not present

## 2020-10-11 DIAGNOSIS — I2583 Coronary atherosclerosis due to lipid rich plaque: Secondary | ICD-10-CM

## 2020-10-11 DIAGNOSIS — B379 Candidiasis, unspecified: Secondary | ICD-10-CM

## 2020-10-11 DIAGNOSIS — E1169 Type 2 diabetes mellitus with other specified complication: Secondary | ICD-10-CM | POA: Diagnosis not present

## 2020-10-11 DIAGNOSIS — D6869 Other thrombophilia: Secondary | ICD-10-CM | POA: Diagnosis not present

## 2020-10-11 DIAGNOSIS — Z23 Encounter for immunization: Secondary | ICD-10-CM

## 2020-10-11 DIAGNOSIS — E785 Hyperlipidemia, unspecified: Secondary | ICD-10-CM | POA: Diagnosis not present

## 2020-10-11 MED ORDER — CLOTRIMAZOLE-BETAMETHASONE 1-0.05 % EX CREA: TOPICAL_CREAM | CUTANEOUS | 3 refills | Status: DC

## 2020-10-11 NOTE — Progress Notes (Signed)
FOLLOW UP 3 MONTH  Assessment:    [name] was seen today for follow-up.  Diagnoses and all orders for this visit:  Essential hypertension Continue current medications:Enalapril 20mg , metoprolol 50mg  daily Monitor blood pressure at home; call if consistently over 130/80 Continue DASH diet.   Reminder to go to the ER if any CP, SOB, nausea, dizziness, severe HA, changes vision/speech, left arm numbness and tingling and jaw pain. -     CBC with Differential/Platelet -     COMPLETE METABOLIC PANEL WITH GFR  Hyperlipidemia associated with type 2 diabetes mellitus (HCC) Continue medications: simvastatin 40mg  Discussed dietary and exercise modifications Low fat diet -     Lipid panel  Coronary artery disease due to lipid rich plaque Control blood pressure, lipids and glucose Disscused lifestyle modifications, diet & exercise Continue to monitor Follows with cardiology   Atrial fibrillation, unspecified type (HCC) Acquired thrombophilia (HCC) No concerns with excessive bleeding  No falls Continue medications: [facility] for management Discussed hospital precautions with patient Will continue to monitor  Type 2 diabetes mellitus with stage 2 chronic kidney disease, without long-term current use of insulin (HCC) Continue medications: Metformin 500mg  two tablets BID, empagliflozin 25mg , glipizide 10mg  one tab breakfast, 2 tabs lunch, 1 tablet dinner. Discussed general issues about diabetes pathophysiology and management. Education: Reviewed 'ABCs' of diabetes management (respective goals in parentheses):  A1C (<7), blood pressure (<130/80), and cholesterol (LDL <70) Dietary recommendations Encouraged aerobic exercise.  Discussed foot care, check daily Yearly retinal exam Dental exam every 6 months Monitor blood glucose, discussed goal for patient -     Hemoglobin A1c  CKD stage 2 due to type 2 diabetes mellitus (HCC) Increase fluids  Avoid NSAIDS Blood pressure  control Monitor sugars  Will continue to monitor  Obesity (BMI 30.0-34.9) Discussed dietary and exercise modifications  Type II diabetes mellitus with nephropathy ([place]) Discussed glucose control Discussed foot care, checks daily  Vitamin D deficiency Continue supplementation to maintain goal of 70-100 Taking Vitamin D 1,000 IU daily Defer vitamin D level  Need for influenza vaccination -     Flu vaccine HIGH DOSE PF  Candida infection -     clotrimazole-betamethasone (LOTRISONE) cream; Apply to rash 2 to 3 x /day  Medication management Continued    Further disposition pending results if labs check today. Discussed med's effects and SE's.   Over 30 minutes of face to face interview, exam, counseling, chart review, and critical decision making was performed.    Future Appointments  Date Time Provider [place]  10/23/2020 10:15 AM [facility]  01/03/2021 11:00 AM Unk Pinto, MD [facility] None  05/14/2021  3:30 PM Garnet Sierras, NP [facility] None       Subjective:  Billy Reilly is a 79 y.o. male with hx of ASCAD/CABG/AoVR who presents for 3 month follow up for HTN,HLD, T2DM, and vitamin D Def.   He reports overall he is doing well. He is accompanied by his wife today.  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 No other purposful exercise.  He drinks two sodas, diet, a day and two glasses of tea, decaf, and a cup on coffee in the morning.    Wife's health is not doing well, advanced kidney disease and discussing starting dialysis.   He has hx of CABG & Mech AoVR in 2004; he is on coumadin and followed by coumadin clinic. Nuclear Stress test  was negative in 2016 by Dr Debara Pickett. He was recentlly found to be in a. Fib/flutter at cardiology, asymptomatic. Had follow up ECHO showed severe/mod atrial enlargement with normal EF. Also incidentally noted ascending aortic  aneurysm 4.4 cm which they plan to follow up with CTA in 6 months.   GERD is well controlled by famotidine 20 mg BID.   BMI is Body mass index is 30.6 kg/m., he has been working on diet and exercise. Weight goal < 200 lb, he has been reducing breakfast meat, eggs, doing oatmeal instead, doing protein + veggies for meal whenever possible, limited evening intake.  He drinks diet mountain dew - <1 bottle daily. He drinks unsweet tea.  Wt Readings from Last 3 Encounters:  10/11/20 207 lb 3.2 oz (94 kg)  07/05/20 207 lb (93.9 kg)  05/03/20 209 lb (94.8 kg)   His blood pressure has been controlled at home, today their BP is BP: 130/76 He does not workout. He denies chest pain, shortness of breath, dizziness.   He is on cholesterol medication (simvastatin 40 mg daily) and denies myalgias. His cholesterol is not at goal for LDL <70. The cholesterol last visit was:   Lab Results  Component Value Date   CHOL 136 10/11/2020   HDL 41 10/11/2020   LDLCALC 79 10/11/2020   TRIG 80 10/11/2020   CHOLHDL 3.3 10/11/2020   He has not been working on diet and exercise for T2DM (metformin 2000 mg daily, jardiance 25mg , glipizide 10mg  one tab breakfast,2tabs lunch 1 dinner.), and denies foot ulcerations, hypoglycemia , increased appetite, nausea, paresthesia of the feet, polydipsia, polyuria, visual disturbances, vomiting and weight loss. He checks a fasting glucose about once weekly, running "about 185." Has had hypoglycemia in the past, typically "has sweats" but none recently. Last A1C in the office was:  Lab Results  Component Value Date   HGBA1C 8.1 (H) 10/11/2020   He has CKD II associated with T2DM monitored at this office:  Lab Results  Component Value Date   [facility] 86 10/11/2020    Patient is not on Vitamin D supplement and very low at recent check, admits has been forgetting to take, but has been taking 5000 IU 2-3 times a week:    Lab Results  Component Value Date   VD25OH 25 (L)  07/05/2020      Medication Review: Current Outpatient Medications on File Prior to Visit  Medication Sig Dispense Refill  . aspirin 81 MG tablet Take 81 mg by mouth daily.      . CHOLECALCIFEROL PO Take 2,000 Units by mouth daily. Takes 2-3 days per week currently    . CINNAMON PO Take 500 mg by mouth in the morning and at bedtime.     . cyanocobalamin (,VITAMIN B-12,) 1000 MCG/ML injection INJECT 1/2 ML EVERY MONTH 10 mL 2  . empagliflozin (JARDIANCE) 25 MG TABS tablet Take 1 tablet (25 mg total) by mouth daily before breakfast. 90 tablet 1  . enalapril (VASOTEC) 20 MG tablet TAKE 2 TABLETS BY MOUTH EVERY DAY 180 tablet 3  . famotidine (PEPCID) 20 MG tablet TAKE 1 TABLET 2 X /DAY WITH MEALS FOR ACID INDIGESTION 180 tablet 3  . glipiZIDE (GLUCOTROL) 10 MG tablet Take 10 mg by mouth. TAKE 1 TABLRT AT BREAKFAST, 2 AT LUNCH, 1 AT SUPPERER     . glucose blood (ONE TOUCH ULTRA TEST) test strip CHECK BLOOD SUGAR ONCE DAILY 100 each 1  . metFORMIN (GLUCOPHAGE-XR) 500 MG 24 hr tablet Take  2 tablets      2 x /day      with Meals for Diabetes 360 tablet 0  . metoprolol succinate (TOPROL-XL) 50 MG 24 hr tablet TAKE 1 TABLET (50 MG TOTAL) BY MOUTH DAILY. TAKE WITH OR IMMEDIATELY FOLLOWING A MEAL. 90 tablet 3  . simvastatin (ZOCOR) 40 MG tablet Take 1 tablet at Bedtime for Cholesterol 90 tablet 3  . warfarin (COUMADIN) 5 MG tablet TAKE 1/2 TO 1 TABLET BY MOUTH DAILY OR AS DIRECTED 90 tablet 0   No current facility-administered medications on file prior to visit.    Allergies: No Known Allergies  Current Problems (verified) has Long term current use of anticoagulant therapy; H/O mechanical aortic valve replacement; Hyperlipidemia associated with type 2 diabetes mellitus ([place]); Type II diabetes mellitus with nephropathy ([place]); Vitamin D deficiency; Vitamin B12 deficiency; Essential hypertension; Medication management; Coronary artery disease due to lipid rich plaque; Hx of CABG; Encounter for  Medicare annual wellness exam; Obesity (BMI 30.0-34.9); CKD stage 2 due to type 2 diabetes mellitus ([place]); Atrial fibrillation ([place]); GERD (gastroesophageal reflux disease); and Ascending aortic aneurysm (HCC) on their problem list.  Screening Tests Immunization History  Administered Date(s) Administered  . Influenza Whole 09/01/2012  . Influenza, High Dose Seasonal PF 09/27/2015, 10/14/2020  . Influenza-Unspecified 08/25/2016, 08/24/2018, 09/13/2019  . PFIZER SARS-COV-2 Vaccination 03/18/2020, 04/13/2020  . Pneumococcal Conjugate-13 12/18/2015  . Pneumococcal Polysaccharide-23 09/01/2012  . Tdap 11/25/2007   Preventative care: Last colonoscopy: 2011  Cologuard: 11/2018 negative   Prior vaccinations: TD or Tdap: 2009 DUE patient declines Influenza: 09/2020  Pneumococcal: 2013 Prevnar13: 2017 Shingles/Zostavax: declines SARS-COV2: [place], complete 04/13/20  Names of Other Physician/Practitioners you currently use: 1. [facility] here for primary care 2. Dr. Delman Cheadle, eye doctor, last visit 11/15/2019 3. Dr. Delphia Grates, dentist, last visit 2021, goes q65m  Patient Care Team: Unk Pinto, MD as PCP - General (Internal Medicine) Debara Pickett Nadean Corwin, MD as PCP - Cardiology (Cardiology) Debara Pickett Nadean Corwin, MD as Consulting Physician (Cardiology) Laurence Spates, MD (Inactive) as Consulting Physician (Gastroenterology)  Surgical: He  has a past surgical history that includes Aortic valve replacement (07/18/2003); Hernia repair (09/11/2011); Aortic valve replacement (avr)/coronary artery bypass grafting (cabg) (07/18/2003); Cardiac catheterization (07/13/2003); TEE without cardioversion (07/18/2003); transthoracic echocardiogram (02/21/2013); and NM MYOCAR PERF WALL MOTION (02/12/2010). Family His family history includes Cancer in his brother and sister; Heart attack in his father; Heart disease in his father; Stroke in his mother. Social history  He  reports that he has never smoked. He has never used smokeless tobacco. He reports that he does not drink alcohol and does not use drugs.     Objective:   Today's Vitals   10/11/20 1131  BP: 130/76  Pulse: 73  Resp: 16  Temp: (!) 97.2 F (36.2 C)  SpO2: 96%  Weight: 207 lb 3.2 oz (94 kg)  Height: 5\' 9"  (1.753 m)   Body mass index is 30.6 kg/m.  General appearance: alert, no distress, WD/WN, male HEENT: normocephalic, sclerae anicteric, TMs pearly, nares patent, no discharge or erythema, pharynx normal Oral cavity: MMM, no lesions Neck: supple, no lymphadenopathy, no thyromegaly, no masses Heart: RRR, normal S1, S2, no murmurs Lungs: CTA bilaterally, no wheezes, rhonchi, or rales Abdomen: +bs, soft, non tender, non distended, no masses, no hepatomegaly, no splenomegaly Musculoskeletal: nontender, no swelling, no obvious deformity Extremities: no edema, no cyanosis, no clubbing Pulses: 2+ symmetric, upper and lower extremities, normal cap refill Neurological: alert, oriented x  3, CN2-12 intact, strength normal upper extremities and lower extremities, sensation normal throughout, DTRs 2+ throughout, no cerebellar signs, gait normal Psychiatric: normal affect, behavior normal, pleasant     Garnet Sierras, Laqueta Jean, DNP [facility] 10/11/2020  12:43 PM

## 2020-10-12 LAB — COMPLETE METABOLIC PANEL WITHOUT GFR
AG Ratio: 2 (calc) (ref 1.0–2.5)
ALT: 16 U/L (ref 9–46)
AST: 16 U/L (ref 10–35)
Albumin: 4.2 g/dL (ref 3.6–5.1)
Alkaline phosphatase (APISO): 85 U/L (ref 35–144)
BUN: 12 mg/dL (ref 7–25)
CO2: 26 mmol/L (ref 20–32)
Calcium: 9.1 mg/dL (ref 8.6–10.3)
Chloride: 106 mmol/L (ref 98–110)
Creat: 0.79 mg/dL (ref 0.70–1.18)
GFR, Est African American: 100 mL/min/{1.73_m2}
GFR, Est Non African American: 86 mL/min/{1.73_m2}
Globulin: 2.1 g/dL (ref 1.9–3.7)
Glucose, Bld: 132 mg/dL — ABNORMAL HIGH (ref 65–99)
Potassium: 4.5 mmol/L (ref 3.5–5.3)
Sodium: 142 mmol/L (ref 135–146)
Total Bilirubin: 0.9 mg/dL (ref 0.2–1.2)
Total Protein: 6.3 g/dL (ref 6.1–8.1)

## 2020-10-12 LAB — CBC WITH DIFFERENTIAL/PLATELET
Absolute Monocytes: 494 {cells}/uL (ref 200–950)
Basophils Absolute: 52 {cells}/uL (ref 0–200)
Basophils Relative: 0.8 %
Eosinophils Absolute: 98 {cells}/uL (ref 15–500)
Eosinophils Relative: 1.5 %
HCT: 48.1 % (ref 38.5–50.0)
Hemoglobin: 16.6 g/dL (ref 13.2–17.1)
Lymphs Abs: 1248 {cells}/uL (ref 850–3900)
MCH: 32.5 pg (ref 27.0–33.0)
MCHC: 34.5 g/dL (ref 32.0–36.0)
MCV: 94.1 fL (ref 80.0–100.0)
MPV: 12 fL (ref 7.5–12.5)
Monocytes Relative: 7.6 %
Neutro Abs: 4609 {cells}/uL (ref 1500–7800)
Neutrophils Relative %: 70.9 %
Platelets: 147 10*3/uL (ref 140–400)
RBC: 5.11 Million/uL (ref 4.20–5.80)
RDW: 13.4 % (ref 11.0–15.0)
Total Lymphocyte: 19.2 %
WBC: 6.5 10*3/uL (ref 3.8–10.8)

## 2020-10-12 LAB — HEMOGLOBIN A1C
Hgb A1c MFr Bld: 8.1 %{Hb} — ABNORMAL HIGH
Mean Plasma Glucose: 186 (calc)
eAG (mmol/L): 10.3 (calc)

## 2020-10-12 LAB — LIPID PANEL
Cholesterol: 136 mg/dL
HDL: 41 mg/dL
LDL Cholesterol (Calc): 79 mg/dL
Non-HDL Cholesterol (Calc): 95 mg/dL
Total CHOL/HDL Ratio: 3.3 (calc)
Triglycerides: 80 mg/dL

## 2020-10-17 DIAGNOSIS — H25013 Cortical age-related cataract, bilateral: Secondary | ICD-10-CM | POA: Diagnosis not present

## 2020-10-17 DIAGNOSIS — H2513 Age-related nuclear cataract, bilateral: Secondary | ICD-10-CM | POA: Diagnosis not present

## 2020-10-23 ENCOUNTER — Other Ambulatory Visit: Payer: Self-pay

## 2020-10-23 ENCOUNTER — Ambulatory Visit (INDEPENDENT_AMBULATORY_CARE_PROVIDER_SITE_OTHER): Payer: PPO | Admitting: Pharmacist Clinician (PhC)/ Clinical Pharmacy Specialist

## 2020-10-23 DIAGNOSIS — Z7901 Long term (current) use of anticoagulants: Secondary | ICD-10-CM | POA: Diagnosis not present

## 2020-10-23 DIAGNOSIS — Z952 Presence of prosthetic heart valve: Secondary | ICD-10-CM

## 2020-10-23 LAB — POCT INR: INR: 2.4 (ref 2.0–3.0)

## 2020-10-25 ENCOUNTER — Other Ambulatory Visit: Payer: Self-pay | Admitting: Internal Medicine

## 2020-10-25 DIAGNOSIS — K219 Gastro-esophageal reflux disease without esophagitis: Secondary | ICD-10-CM

## 2020-11-20 ENCOUNTER — Ambulatory Visit (INDEPENDENT_AMBULATORY_CARE_PROVIDER_SITE_OTHER): Payer: PPO | Admitting: Pharmacist

## 2020-11-20 ENCOUNTER — Other Ambulatory Visit: Payer: Self-pay

## 2020-11-20 DIAGNOSIS — Z952 Presence of prosthetic heart valve: Secondary | ICD-10-CM | POA: Diagnosis not present

## 2020-11-20 DIAGNOSIS — Z7901 Long term (current) use of anticoagulants: Secondary | ICD-10-CM | POA: Diagnosis not present

## 2020-11-20 LAB — POCT INR: INR: 2.4 (ref 2.0–3.0)

## 2020-12-03 ENCOUNTER — Other Ambulatory Visit: Payer: Self-pay | Admitting: Internal Medicine

## 2020-12-03 ENCOUNTER — Other Ambulatory Visit: Payer: Self-pay | Admitting: Adult Health

## 2020-12-18 ENCOUNTER — Other Ambulatory Visit: Payer: Self-pay

## 2020-12-18 ENCOUNTER — Ambulatory Visit (INDEPENDENT_AMBULATORY_CARE_PROVIDER_SITE_OTHER): Payer: PPO | Admitting: Pharmacist Clinician (PhC)/ Clinical Pharmacy Specialist

## 2020-12-18 DIAGNOSIS — Z7901 Long term (current) use of anticoagulants: Secondary | ICD-10-CM | POA: Diagnosis not present

## 2020-12-18 DIAGNOSIS — Z952 Presence of prosthetic heart valve: Secondary | ICD-10-CM | POA: Diagnosis not present

## 2020-12-18 DIAGNOSIS — I4891 Unspecified atrial fibrillation: Secondary | ICD-10-CM

## 2020-12-18 LAB — POCT INR: INR: 3.8 — AB (ref 2.0–3.0)

## 2020-12-30 DIAGNOSIS — Z5181 Encounter for therapeutic drug level monitoring: Secondary | ICD-10-CM | POA: Diagnosis not present

## 2020-12-30 DIAGNOSIS — I480 Paroxysmal atrial fibrillation: Secondary | ICD-10-CM | POA: Diagnosis not present

## 2020-12-30 DIAGNOSIS — Z7901 Long term (current) use of anticoagulants: Secondary | ICD-10-CM | POA: Diagnosis not present

## 2020-12-30 DIAGNOSIS — Z952 Presence of prosthetic heart valve: Secondary | ICD-10-CM | POA: Diagnosis not present

## 2020-12-30 LAB — POCT INR: INR: 2.7 (ref 2.0–3.0)

## 2020-12-31 ENCOUNTER — Ambulatory Visit (INDEPENDENT_AMBULATORY_CARE_PROVIDER_SITE_OTHER): Payer: PPO | Admitting: Cardiology

## 2020-12-31 DIAGNOSIS — Z7901 Long term (current) use of anticoagulants: Secondary | ICD-10-CM

## 2020-12-31 DIAGNOSIS — Z952 Presence of prosthetic heart valve: Secondary | ICD-10-CM

## 2021-01-03 ENCOUNTER — Encounter: Payer: Self-pay | Admitting: Internal Medicine

## 2021-01-03 ENCOUNTER — Ambulatory Visit (INDEPENDENT_AMBULATORY_CARE_PROVIDER_SITE_OTHER): Payer: PPO | Admitting: Internal Medicine

## 2021-01-03 ENCOUNTER — Other Ambulatory Visit: Payer: Self-pay

## 2021-01-03 VITALS — BP 136/78 | HR 57 | Temp 97.0°F | Resp 16 | Ht 68.5 in | Wt 205.0 lb

## 2021-01-03 DIAGNOSIS — Z8249 Family history of ischemic heart disease and other diseases of the circulatory system: Secondary | ICD-10-CM | POA: Diagnosis not present

## 2021-01-03 DIAGNOSIS — N401 Enlarged prostate with lower urinary tract symptoms: Secondary | ICD-10-CM

## 2021-01-03 DIAGNOSIS — N138 Other obstructive and reflux uropathy: Secondary | ICD-10-CM | POA: Diagnosis not present

## 2021-01-03 DIAGNOSIS — I4891 Unspecified atrial fibrillation: Secondary | ICD-10-CM

## 2021-01-03 DIAGNOSIS — Z125 Encounter for screening for malignant neoplasm of prostate: Secondary | ICD-10-CM | POA: Diagnosis not present

## 2021-01-03 DIAGNOSIS — E785 Hyperlipidemia, unspecified: Secondary | ICD-10-CM

## 2021-01-03 DIAGNOSIS — K219 Gastro-esophageal reflux disease without esophagitis: Secondary | ICD-10-CM | POA: Diagnosis not present

## 2021-01-03 DIAGNOSIS — Z952 Presence of prosthetic heart valve: Secondary | ICD-10-CM

## 2021-01-03 DIAGNOSIS — Z136 Encounter for screening for cardiovascular disorders: Secondary | ICD-10-CM

## 2021-01-03 DIAGNOSIS — D6869 Other thrombophilia: Secondary | ICD-10-CM

## 2021-01-03 DIAGNOSIS — Z0000 Encounter for general adult medical examination without abnormal findings: Secondary | ICD-10-CM

## 2021-01-03 DIAGNOSIS — I482 Chronic atrial fibrillation, unspecified: Secondary | ICD-10-CM

## 2021-01-03 DIAGNOSIS — Z1212 Encounter for screening for malignant neoplasm of rectum: Secondary | ICD-10-CM

## 2021-01-03 DIAGNOSIS — I10 Essential (primary) hypertension: Secondary | ICD-10-CM

## 2021-01-03 DIAGNOSIS — E1169 Type 2 diabetes mellitus with other specified complication: Secondary | ICD-10-CM

## 2021-01-03 DIAGNOSIS — E1122 Type 2 diabetes mellitus with diabetic chronic kidney disease: Secondary | ICD-10-CM

## 2021-01-03 DIAGNOSIS — I2510 Atherosclerotic heart disease of native coronary artery without angina pectoris: Secondary | ICD-10-CM | POA: Diagnosis not present

## 2021-01-03 DIAGNOSIS — Z951 Presence of aortocoronary bypass graft: Secondary | ICD-10-CM

## 2021-01-03 DIAGNOSIS — Z0001 Encounter for general adult medical examination with abnormal findings: Secondary | ICD-10-CM

## 2021-01-03 DIAGNOSIS — Z79899 Other long term (current) drug therapy: Secondary | ICD-10-CM | POA: Diagnosis not present

## 2021-01-03 DIAGNOSIS — E559 Vitamin D deficiency, unspecified: Secondary | ICD-10-CM

## 2021-01-03 DIAGNOSIS — I2583 Coronary atherosclerosis due to lipid rich plaque: Secondary | ICD-10-CM | POA: Diagnosis not present

## 2021-01-03 DIAGNOSIS — Z1211 Encounter for screening for malignant neoplasm of colon: Secondary | ICD-10-CM

## 2021-01-03 DIAGNOSIS — N182 Chronic kidney disease, stage 2 (mild): Secondary | ICD-10-CM

## 2021-01-03 DIAGNOSIS — I712 Thoracic aortic aneurysm, without rupture: Secondary | ICD-10-CM

## 2021-01-03 DIAGNOSIS — I7121 Aneurysm of the ascending aorta, without rupture: Secondary | ICD-10-CM

## 2021-01-03 NOTE — Progress Notes (Signed)
Annual  Screening/Preventative Visit  & Comprehensive Evaluation & Examination     This very nice 80 y.o. MWM presents for a Screening /Preventative Visit & comprehensive evaluation and management of multiple medical co-morbidities.  Patient has been followed for HTN, ASHD, HLD, T2_NIDDM  and Vitamin D Deficiency.     HTN predates  circa 1994. Patient's BP has been controlled at home.  Today's BP is at goal -  136/78. In 2004, patient underwent CABG with [place] and has since been on Coumadin followed at [facility].  In 2016, patient had a Negative Myoview by Dr Debara Pickett.  In early 2021, he was discovered in AFlutter by Dr  Debara Pickett.  Patient denies any cardiac symptoms as chest pain, palpitations, shortness of breath, dizziness or ankle swelling.     Patient's hyperlipidemia is controlled with diet and medications. Patient denies myalgias or other medication SE's. Last lipids were at goal:  Lab Results  Component Value Date   CHOL 136 10/11/2020   HDL 41 10/11/2020   LDLCALC 79 10/11/2020   TRIG 80 10/11/2020   CHOLHDL 3.3 10/11/2020       Patient has hx/o T2_NIDDM ( 2004) w/CKD2 (GFR 86) and patient denies reactive hypoglycemic symptoms, visual blurring, diabetic polys or paresthesias. Patient is on Metformin, Glipizide & Jardiance and last A1c was not at goal:    Lab Results  Component Value Date   HGBA1C 8.1 (H) 10/11/2020        Finally, patient has history of Vitamin D Deficiency ("28" /2012) and last vitamin D was still very low (goal 70-100):   Lab Results  Component Value Date   VD25OH 25 (L) 07/05/2020    Current Outpatient Medications on File Prior to Visit  Medication Sig  . aspirin 81 MG tablet Take  daily.  . CHOLECALCIFEROL PO Take 2,000 Units by mouth daily. Takes 2-3 days per week currently  . CINNAMON PO Take 500 mg by mouth in the morning and at bedtime.   Marland Kitchen LOTRISONE cream Apply to rash 2 to 3 x /day  . VIT B-12 1000 MCG/ML injec INJECT 1/2  ML EVERY MONTH  . empagliflozin  25 MG TABS Take      1 tablet     Daily         for Diabetes  . enalapril  20 MG tablet TAKE 2 TABLETS BY MOUTH EVERY DAY  . famotidine  20 MG tablet TAKE 1 TABLET TWICE A DAY WITH MEALS FO  . glipiZIDE10 MG tablet TAKE 1 TABLET 3 X /DAY BEFORE MEALS FOR DIABETES  . metFORMIN-XR 500 MG 24 Take      2 tablets      2 x /day      with Meals for Diabetes  . metoprolol succ-XL 50 MG 24  TAKE 1 TABLET (50 MG TOTAL) BY MOUTH DAILY. TAKE WITH OR IMMEDIATELY FOLLOWING A MEAL.  . simvastatin40 MG tablet Take      1 tablet       at Bedtime       for Cholesterol  . warfarin  5 MG tablet  TAKE 1/2 TO 1 TABLET BY MOUTH DAILY OR AS DIRECTED    No Known Allergies  Past Medical History:  Diagnosis Date  . Anemia   . AS (aortic stenosis)    valve replacement-st. jude mechanical valve  . ASHD (arteriosclerotic heart disease)   . CAD (coronary artery disease), native coronary artery    CABG 07/18/2003  .  Cancer [facility])    prostate  . GERD (gastroesophageal reflux disease)   . History of nuclear stress test 02/12/10   EF 67%, normal perfusion  . Hyperlipidemia   . Hypertension   . Morbid obesity ([place])   . Type II or unspecified type diabetes mellitus without mention of complication, not stated as uncontrolled   . Umbilical hernia   . Vitamin B12 deficiency   . Vitamin D deficiency    Health Maintenance  Topic Date Due  . Hepatitis C Screening  Never done  . OPHTHALMOLOGY EXAM  11/14/2020  . FOOT EXAM  12/19/2020  . TETANUS/TDAP  03/29/2021 (Originally 11/24/2017)  . HEMOGLOBIN A1C  04/10/2021  . INFLUENZA VACCINE  Completed  . COVID-19 Vaccine  Completed  . PNA vac Low Risk Adult  Completed   Immunization History  Administered Date(s) Administered  . Fluad Quad(high Dose 65+) 10/14/2020  . Influenza Whole 09/01/2012  . Influenza, High Dose Seasonal PF 09/27/2015, 10/14/2020  . Influenza-Unspecified 08/25/2016, 08/24/2018, 09/13/2019  . PFIZER(Purple  Top)SARS-COV-2 Vaccination 03/18/2020, 04/13/2020  . Pneumococcal Conjugate-13 12/18/2015  . Pneumococcal Polysaccharide-23 09/01/2012  . Tdap 11/25/2007   Last Colon - 11/30/2018 - Negative - Recc f/u 3 years due Jan 2023.  Past Surgical History:  Procedure Laterality Date  . AORTIC VALVE REPLACEMENT  07/18/2003   44mm ATS-AP valve  . AORTIC VALVE REPLACEMENT (AVR)/CORONARY ARTERY BYPASS GRAFTING (CABG)  07/18/2003   AVR; bypass x 2-SVG to first diag, SVG to posterior descending  . CARDIAC CATHETERIZATION  07/13/2003   AS-aortic valve gradient 9mmhg, valve area 1.1; obstruction of ostium of posterior lateral branch with obstruction in the ostium of first diagnonal  . HERNIA REPAIR  75/08/2584   RIH, umbilical  . NM MYOCAR PERF WALL MOTION  02/12/2010   protocol:[place], EF 67%, exercise cap 7 METS, low risk scan  . TEE WITHOUT CARDIOVERSION  07/18/2003   no evidence of PE, Aortic valve heavily calcified,   . TRANSTHORACIC ECHOCARDIOGRAM  02/21/2013   EF60-65%, Septal motion showed paradox. atrium moderately dilated   Family History  Problem Relation Age of Onset  . Stroke Mother   . Heart attack Father   . Heart disease Father   . Cancer Brother   . Cancer Sister    Social History   Socioeconomic History  . Marital status: Married    Spouse name: Beverlee Nims  . Number of children: None  Occupational History  . Commercial truck driver  Tobacco Use  . Smoking status: Never Smoker  . Smokeless tobacco: Never Used  Substance and Sexual Activity  . Alcohol use: No  . Drug use: No  . Sexual activity: Not on file    ROS Constitutional: Denies fever, chills, weight loss/gain, headaches, insomnia,  night sweats or change in appetite. Does c/o fatigue. Eyes: Denies redness, blurred vision, diplopia, discharge, itchy or watery eyes.  ENT: Denies discharge, congestion, post nasal drip, epistaxis, sore throat, earache, hearing loss, dental pain, Tinnitus, Vertigo, Sinus pain or  snoring.  Cardio: Denies chest pain, palpitations, irregular heartbeat, syncope, dyspnea, diaphoresis, orthopnea, PND, claudication or edema Respiratory: denies cough, dyspnea, DOE, pleurisy, hoarseness, laryngitis or wheezing.  Gastrointestinal: Denies dysphagia, heartburn, reflux, water brash, pain, cramps, nausea, vomiting, bloating, diarrhea, constipation, hematemesis, melena, hematochezia, jaundice or hemorrhoids Genitourinary: Denies dysuria, frequency, urgency, nocturia, hesitancy, discharge, hematuria or flank pain Musculoskeletal: Denies arthralgia, myalgia, stiffness, Jt. Swelling, pain, limp or strain/sprain. Denies Falls. Skin: Denies puritis, rash, hives, warts, acne, eczema or change in skin lesion Neuro:  No weakness, tremor, incoordination, spasms, paresthesia or pain Psychiatric: Denies confusion, memory loss or sensory loss. Denies Depression. Endocrine: Denies change in weight, skin, hair change, nocturia, and paresthesia, diabetic polys, visual blurring or hyper / hypo glycemic episodes.  Heme/Lymph: No excessive bleeding, bruising or enlarged lymph nodes.  Physical Exam  BP 136/78   Pulse (!) 57   Temp (!) 97 F (36.1 C)   Resp 16   Ht 5' 8.5" (1.74 m)   Wt 205 lb (93 kg)   SpO2 97%   BMI 30.72 kg/m   General Appearance: Well nourished and well groomed and in no apparent distress.  Eyes: PERRLA, EOMs, conjunctiva no swelling or erythema, normal fundi and vessels. Sinuses: No frontal/maxillary tenderness ENT/Mouth: EACs patent / TMs  nl. Nares clear without erythema, swelling, mucoid exudates. Oral hygiene is good. No erythema, swelling, or exudate. Tongue normal, non-obstructing. Tonsils not swollen or erythematous. Hearing normal.  Neck: Supple, thyroid not palpable. No bruits, nodes or JVD. Respiratory: Respiratory effort normal.  BS equal and clear bilateral without rales, rhonci, wheezing or stridor. Cardio: Heart sounds reveal mechanical valve sounds with  regular rate and rhythm and no murmurs, rubs or gallops. Peripheral pulses are normal and equal bilaterally without edema. No aortic or femoral bruits. Chest: symmetric with normal excursions and percussion.  Abdomen: Soft, with Nl bowel sounds. Nontender, no guarding, rebound, hernias, masses, or organomegaly.  Lymphatics: Non tender without lymphadenopathy.  Musculoskeletal: Full ROM all peripheral extremities, joint stability, 5/5 strength, and normal gait. Skin: Warm and dry without rashes, lesions, cyanosis, clubbing or  ecchymosis.  Neuro: Cranial nerves intact, reflexes equal bilaterally. Normal muscle tone, no cerebellar symptoms. Sensation intact.  Pysch: Alert and oriented X 3 with normal affect, insight and judgment appropriate.   Assessment and Plan  1. Annual Preventative/Screening Exam    2. Essential hypertension  - EKG 12-Lead - Urinalysis, Routine w reflex microscopic - Microalbumin / creatinine urine ratio - CBC with Differential/Platelet - COMPLETE METABOLIC PANEL WITH GFR - Magnesium - TSH  3. Hyperlipidemia associated with type 2 diabetes mellitus ([place])  - EKG 12-Lead - [place], RETROPERITNL ABD,  LTD - Lipid panel  4. Type 2 diabetes mellitus with stage 2 chronic kidney  disease, without long-term current use of insulin (HCC)  - EKG 12-Lead - HM DIABETES FOOT EXAM - LOW EXTREMITY NEUR EXAM DOCUM - Hemoglobin A1c  5. Vitamin D deficiency   6. Coronary artery disease due to lipid rich plaque  - EKG 12-Lead - Lipid panel  7. Atrial fibrillation, unspecified type ([place])  - EKG 12-Lead - TSH  8. Acquired thrombophilia ([place])  - CBC with Differential/Platelet  9. BPH with obstruction/lower urinary tract symptoms  - PSA  10. Gastroesophageal reflux disease without esophagitis  - CBC with Differential/Platelet  11. Ascending aortic aneurysm (HCC)  - [place], RETROPERITNL ABD,  LTD  12. H/O mechanical aortic valve replacement   13. Screening for  colorectal cancer  - POC Hemoccult Bld/Stl (  14. Prostate cancer screening  - PSA  15. Screening for ischemic heart disease  - EKG 12-Lead  16. FHx: heart disease  - EKG 12-Lead - [place], RETROPERITNL ABD,  LTD  17. Screening for AAA (aortic abdominal aneurysm)  - [place], RETROPERITNL ABD,  LTD  18. Medication management  - Urinalysis, Routine w reflex microscopic - Microalbumin / creatinine urine ratio - CBC with Differential/Platelet - COMPLETE METABOLIC PANEL WITH GFR - Magnesium - Lipid panel - TSH - Hemoglobin A1c  Patient was counseled in prudent diet, weight control to achieve/maintain BMI less than 25, BP monitoring, regular exercise and medications as discussed.  Discussed med effects and SE's. Routine screening labs and tests as requested with regular follow-up as recommended. Over 40 minutes of exam, counseling, chart review and high complex critical decision making was performed   Kirtland Bouchard, MD

## 2021-01-03 NOTE — Patient Instructions (Signed)

## 2021-01-04 LAB — CBC WITH DIFFERENTIAL/PLATELET
Absolute Monocytes: 577 {cells}/uL (ref 200–950)
Basophils Absolute: 62 {cells}/uL (ref 0–200)
Basophils Relative: 1 %
Eosinophils Absolute: 112 {cells}/uL (ref 15–500)
Eosinophils Relative: 1.8 %
HCT: 48.8 % (ref 38.5–50.0)
Hemoglobin: 16.7 g/dL (ref 13.2–17.1)
Lymphs Abs: 1252 {cells}/uL (ref 850–3900)
MCH: 32.8 pg (ref 27.0–33.0)
MCHC: 34.2 g/dL (ref 32.0–36.0)
MCV: 95.9 fL (ref 80.0–100.0)
MPV: 11.9 fL (ref 7.5–12.5)
Monocytes Relative: 9.3 %
Neutro Abs: 4197 {cells}/uL (ref 1500–7800)
Neutrophils Relative %: 67.7 %
Platelets: 173 10*3/uL (ref 140–400)
RBC: 5.09 Million/uL (ref 4.20–5.80)
RDW: 13 % (ref 11.0–15.0)
Total Lymphocyte: 20.2 %
WBC: 6.2 10*3/uL (ref 3.8–10.8)

## 2021-01-04 LAB — LIPID PANEL
Cholesterol: 148 mg/dL
HDL: 42 mg/dL
LDL Cholesterol (Calc): 90 mg/dL
Non-HDL Cholesterol (Calc): 106 mg/dL
Total CHOL/HDL Ratio: 3.5 (calc)
Triglycerides: 75 mg/dL

## 2021-01-04 LAB — URINALYSIS, ROUTINE W REFLEX MICROSCOPIC
Bilirubin Urine: NEGATIVE
Hgb urine dipstick: NEGATIVE
Leukocytes,Ua: NEGATIVE
Nitrite: NEGATIVE
Protein, ur: NEGATIVE
Specific Gravity, Urine: 1.037 — ABNORMAL HIGH (ref 1.001–1.03)
pH: <=5 (ref 5.0–8.0)

## 2021-01-04 LAB — COMPLETE METABOLIC PANEL WITHOUT GFR
AG Ratio: 1.9 (calc) (ref 1.0–2.5)
ALT: 15 U/L (ref 9–46)
AST: 16 U/L (ref 10–35)
Albumin: 4.3 g/dL (ref 3.6–5.1)
Alkaline phosphatase (APISO): 87 U/L (ref 35–144)
BUN/Creatinine Ratio: 24 (calc) — ABNORMAL HIGH (ref 6–22)
BUN: 16 mg/dL (ref 7–25)
CO2: 24 mmol/L (ref 20–32)
Calcium: 9 mg/dL (ref 8.6–10.3)
Chloride: 108 mmol/L (ref 98–110)
Creat: 0.67 mg/dL — ABNORMAL LOW (ref 0.70–1.18)
GFR, Est African American: 107 mL/min/{1.73_m2}
GFR, Est Non African American: 92 mL/min/{1.73_m2}
Globulin: 2.3 g/dL (ref 1.9–3.7)
Glucose, Bld: 126 mg/dL — ABNORMAL HIGH (ref 65–99)
Potassium: 4.3 mmol/L (ref 3.5–5.3)
Sodium: 141 mmol/L (ref 135–146)
Total Bilirubin: 1.1 mg/dL (ref 0.2–1.2)
Total Protein: 6.6 g/dL (ref 6.1–8.1)

## 2021-01-04 LAB — MICROALBUMIN / CREATININE URINE RATIO
Creatinine, Urine: 67 mg/dL (ref 20–320)
Microalb Creat Ratio: 21 ug/mg{creat}
Microalb, Ur: 1.4 mg/dL

## 2021-01-04 LAB — HEMOGLOBIN A1C
Hgb A1c MFr Bld: 7.7 %{Hb} — ABNORMAL HIGH
Mean Plasma Glucose: 174 mg/dL
eAG (mmol/L): 9.7 mmol/L

## 2021-01-04 LAB — MAGNESIUM: Magnesium: 2.1 mg/dL (ref 1.5–2.5)

## 2021-01-04 LAB — PSA: PSA: 0.05 ng/mL

## 2021-01-04 LAB — TSH: TSH: 1.78 m[IU]/L (ref 0.40–4.50)

## 2021-01-05 NOTE — Progress Notes (Signed)
========================================================== ==========================================================  -    PSA - Undetectable - Great   !  ========================================================== ==========================================================  -  Total Chol = 148   and LDL Chol = 90 - Both  Excellent   - Very low risk for Heart Attack  / Stroke ========================================================== ==========================================================  -  A1c = 7.7% - about the same   & Still too high    - Avoid Sweets, Candy & White Stuff   - Rice, Potatoes, Breads &  Pasta  & Lose Weight  ========================================================== ==========================================================  -  All Else - CBC - Kidneys - U/A - Electrolytes - Liver - Magnesium & Thyroid    - all  Normal / OK ========================================================== ==========================================================

## 2021-01-17 LAB — POCT INR: INR: 2.1 (ref 2.0–3.0)

## 2021-01-18 ENCOUNTER — Ambulatory Visit (INDEPENDENT_AMBULATORY_CARE_PROVIDER_SITE_OTHER): Payer: PPO | Admitting: Cardiology

## 2021-01-18 DIAGNOSIS — Z7901 Long term (current) use of anticoagulants: Secondary | ICD-10-CM

## 2021-01-18 DIAGNOSIS — Z952 Presence of prosthetic heart valve: Secondary | ICD-10-CM

## 2021-01-28 ENCOUNTER — Other Ambulatory Visit: Payer: Self-pay | Admitting: Internal Medicine

## 2021-02-05 ENCOUNTER — Ambulatory Visit (INDEPENDENT_AMBULATORY_CARE_PROVIDER_SITE_OTHER): Payer: PPO | Admitting: Cardiovascular Disease

## 2021-02-05 DIAGNOSIS — Z7901 Long term (current) use of anticoagulants: Secondary | ICD-10-CM

## 2021-02-05 DIAGNOSIS — I482 Chronic atrial fibrillation, unspecified: Secondary | ICD-10-CM

## 2021-02-05 DIAGNOSIS — Z952 Presence of prosthetic heart valve: Secondary | ICD-10-CM

## 2021-02-05 LAB — POCT INR: INR: 2.7 (ref 2.0–3.0)

## 2021-02-05 NOTE — Patient Instructions (Signed)
Description   continue with 1 tablet each Monday and Friday, 1/2 tablet all other days. Repeat INR in 2 weeks

## 2021-02-19 ENCOUNTER — Other Ambulatory Visit: Payer: Self-pay

## 2021-02-19 DIAGNOSIS — Z1211 Encounter for screening for malignant neoplasm of colon: Secondary | ICD-10-CM

## 2021-02-19 DIAGNOSIS — Z1212 Encounter for screening for malignant neoplasm of rectum: Secondary | ICD-10-CM

## 2021-02-19 LAB — POC HEMOCCULT BLD/STL (HOME/3-CARD/SCREEN)
Card #2 Fecal Occult Blod, POC: NEGATIVE
Card #3 Fecal Occult Blood, POC: NEGATIVE
Fecal Occult Blood, POC: NEGATIVE

## 2021-02-20 ENCOUNTER — Telehealth: Payer: Self-pay

## 2021-02-20 DIAGNOSIS — Z1212 Encounter for screening for malignant neoplasm of rectum: Secondary | ICD-10-CM | POA: Diagnosis not present

## 2021-02-20 DIAGNOSIS — Z1211 Encounter for screening for malignant neoplasm of colon: Secondary | ICD-10-CM | POA: Diagnosis not present

## 2021-02-20 NOTE — Telephone Encounter (Signed)
lmom for overdue inr check 

## 2021-02-24 LAB — POCT INR: INR: 2.1 (ref 2.0–3.0)

## 2021-02-25 ENCOUNTER — Ambulatory Visit (INDEPENDENT_AMBULATORY_CARE_PROVIDER_SITE_OTHER): Payer: PPO | Admitting: Cardiology

## 2021-02-25 ENCOUNTER — Other Ambulatory Visit: Payer: Self-pay | Admitting: Internal Medicine

## 2021-02-25 DIAGNOSIS — Z7901 Long term (current) use of anticoagulants: Secondary | ICD-10-CM

## 2021-02-25 DIAGNOSIS — Z952 Presence of prosthetic heart valve: Secondary | ICD-10-CM

## 2021-03-09 DIAGNOSIS — Z952 Presence of prosthetic heart valve: Secondary | ICD-10-CM | POA: Diagnosis not present

## 2021-03-09 DIAGNOSIS — Z7901 Long term (current) use of anticoagulants: Secondary | ICD-10-CM | POA: Diagnosis not present

## 2021-03-09 DIAGNOSIS — I480 Paroxysmal atrial fibrillation: Secondary | ICD-10-CM | POA: Diagnosis not present

## 2021-03-09 DIAGNOSIS — Z5181 Encounter for therapeutic drug level monitoring: Secondary | ICD-10-CM | POA: Diagnosis not present

## 2021-03-11 ENCOUNTER — Ambulatory Visit (INDEPENDENT_AMBULATORY_CARE_PROVIDER_SITE_OTHER): Payer: PPO | Admitting: Cardiovascular Disease

## 2021-03-11 DIAGNOSIS — Z7901 Long term (current) use of anticoagulants: Secondary | ICD-10-CM

## 2021-03-11 DIAGNOSIS — Z952 Presence of prosthetic heart valve: Secondary | ICD-10-CM

## 2021-03-11 LAB — POCT INR: INR: 2.7 (ref 2.0–3.0)

## 2021-03-14 ENCOUNTER — Other Ambulatory Visit: Payer: Self-pay | Admitting: Internal Medicine

## 2021-03-24 LAB — POCT INR: INR: 2.8 (ref 2.0–3.0)

## 2021-03-26 ENCOUNTER — Ambulatory Visit (INDEPENDENT_AMBULATORY_CARE_PROVIDER_SITE_OTHER): Payer: PPO | Admitting: Cardiovascular Disease

## 2021-03-26 DIAGNOSIS — Z952 Presence of prosthetic heart valve: Secondary | ICD-10-CM | POA: Diagnosis not present

## 2021-03-26 DIAGNOSIS — Z7901 Long term (current) use of anticoagulants: Secondary | ICD-10-CM | POA: Diagnosis not present

## 2021-04-01 DIAGNOSIS — I700 Atherosclerosis of aorta: Secondary | ICD-10-CM | POA: Insufficient documentation

## 2021-04-01 NOTE — Progress Notes (Signed)
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   Coronary artery disease due to lipid rich plaque Control blood pressure, cholesterol, glucose, increase exercise.  Followed by cardiology -     Lipid panel  Essential hypertension Continue medication Monitor blood pressure at home; call if consistently over 140/80 Continue DASH diet.   Reminder to go to the ER if any CP, SOB, nausea, dizziness, severe HA, changes vision/speech, left arm numbness and tingling and jaw pain.  H/O mechanical aortic valve replacement Continue coumadin; followed by cardiology  Hx of CABG Control blood pressure, cholesterol, glucose, increase exercise.  Followed by cardiolgy  Acquired thrombophilia (HCC)/ Long term current use of anticoagulant therapy Managed by cardiology Discussed if patient falls to immediately contact office or go to ER. Discussed foods that can increase or decrease Coumadin levels.  Patient understands to call the office before starting a new medication.  Type II diabetes mellitus with nephropathy [facility]) Education: Reviewed 'ABCs' of diabetes management (respective goals in parentheses):  A1C (<8), blood pressure (<130/80), and cholesterol (LDL <70) Eye Exam yearly and Dental Exam every 6 months  Dietary recommendations Physical Activity recommendations UTD foot exam  -     Hemoglobin A1c  Mixed hyperlipidemia associated with T2DM (HCC) Currently above goal of LDL  <70 Plan switch to rosuvastatin 10 mg daily if remains above goal today Continue low cholesterol diet and exercise.  Check lipid panel.  -     Lipid panel -     TSH  CKD II associated with T2DM (HCC) Increase fluids, avoid NSAIDS, monitor sugars, will monitor  Obesity (BMI 30.0-34.9) Long discussion about weight loss, diet, and exercise Recommended diet heavy in fruits and veggies and low in animal meats, cheeses, and dairy products, appropriate calorie intake Discussed appropriate weight for height and initial goal (200 lb) Follow up at next visit  Umbilical hernia without obstruction and without gangrene No complications; continue to monitor  Vitamin B12 deficiency Continue supplement; check levels at CPE  Vitamin D deficiency Patient admits taking irregularly, encouraged daily 5000 IU, take with all other meds Check vitamin D next visit   Medication management -     CBC with Differential/Platelet -     CMPGFR   Over 30 minutes of exam, counseling, chart review, and critical decision making was performed  Future Appointments  Date Time Provider [place]  05/07/2021  8:15 AM Almyra Deforest, PA [facility]  07/09/2021 11:30 AM Unk Pinto, MD [facility] None  04/02/2022 11:00 AM Liane Comber, NP [facility] None     Plan:   During the course of the visit the patient was educated and counseled about appropriate screening and preventive services including:    Pneumococcal vaccine   Influenza vaccine  Prevnar 13  Td vaccine  Screening electrocardiogram  Colorectal cancer screening  Diabetes screening  Glaucoma screening  Nutrition counseling    Subjective:  Billy Reilly is a 80 y.o. male with hx of ASCAD/CABG/AoVR who presents for Medicare Annual Wellness Visit and 3 month follow up for  HTN, hyperlipidemia, T2DM, and vitamin D Def.   Wife's health is not doing well, advanced kidney disease and discussing starting dialysis.   GERD is  well controlled by famotidine 20 mg BID.   BMI is Body mass index is 30.72 kg/m., he has been working on diet and exercise. Weight goal < 200 lb, he has been reducing breakfast meat, eggs, doing oatmeal instead, doing protein + veggies for meal whenever possible, limited evening intake.  He drinks diet mountain dew - <1 bottle daily. He drinks unsweet tea.  Wt Readings from Last 3 Encounters:  04/02/21 205 lb (93 kg)  01/03/21 205 lb (93 kg)  10/11/20 207 lb 3.2 oz (94 kg)   He has hx of CABG & Mech AoVR in 2004; he is on coumadin and followed by coumadin clinic. Nuclear Stress test was negative in 2016 by Dr Debara Pickett. In early 2021 he was found to be in a. Fib/flutter at cardiology, asymptomatic. Had follow up ECHO 12/27/2019 showed severe/mod atrial enlargement with normal EF. Also incidentally noted ascending aortic aneurysm 4.4 cm, had 6 month CTA 06/2020 that showed 4.0 cm dilation and recommended annual follow up.   His blood pressure has been controlled at home (120s/70s), today their BP is BP: 140/82 He does not workout. He denies chest pain, shortness of breath, dizziness.   He is on cholesterol medication (simvastatin 40 mg daily ) and denies myalgias. His cholesterol is not at goal for LDL <70. The cholesterol last visit was:   Lab Results  Component Value Date   CHOL 148 01/03/2021   HDL 42 01/03/2021   LDLCALC 90 01/03/2021   TRIG 75 01/03/2021   CHOLHDL 3.5 01/03/2021   He has not been working on diet and exercise for T2DM (metformin 2000 mg daily, glipizide 10 mg - 4 tabs daily over meals, jardiance 25 mg daily, cinnamon), and denies foot ulcerations, hypoglycemia , increased appetite, nausea, paresthesia of the feet, polydipsia, polyuria, visual disturbances, vomiting and weight loss.  He checks a fasting glucose a few days a week, recently 160-165.   Has had hypoglycemia in the past, typically "has sweats" but none recently.  Last A1C in the office was:  Lab Results  Component  Value Date   HGBA1C 7.7 (H) 01/03/2021   He has CKD I/II associated with T2DM monitored at this office:  Lab Results  Component Value Date   GFRNONAA 92 01/03/2021    Patient is not on Vitamin D supplement and very low at last check, admits has been forgetting to take, but has been taking 5000 IU 2-3 times a week:    Lab Results  Component Value Date   VD25OH 25 (L) 07/05/2020       Medication Review: Current Outpatient Medications on File Prior to Visit  Medication Sig Dispense Refill  . aspirin 81 MG tablet Take 81 mg by mouth daily.    . CHOLECALCIFEROL PO Take 2,000 Units by mouth daily. Takes 2-3 days per week currently    . CINNAMON PO Take 500 mg by mouth in the morning and at bedtime.     . clotrimazole-betamethasone (LOTRISONE) cream Apply to rash 2 to 3 x /day 45 g 3  . cyanocobalamin (,VITAMIN B-12,) 1000 MCG/ML injection INJECT 1/2 ML EVERY MONTH 10 mL 2  . empagliflozin (JARDIANCE) 25 MG TABS tablet Take      1 tablet     Daily         for Diabetes 90 tablet 1  .  enalapril (VASOTEC) 20 MG tablet TAKE 2 TABLETS BY MOUTH EVERY DAY 180 tablet 3  . famotidine (PEPCID) 20 MG tablet TAKE 1 TABLET BY MOUTH TWICE A DAY WITH MEALS FOR ACIDE INDIGESTION 180 tablet 3  . glipiZIDE (GLUCOTROL) 10 MG tablet TAKE 1 TABLET 3 X /DAY BEFORE MEALS FOR DIABETES 270 tablet 3  . glucose blood (ONE TOUCH ULTRA TEST) test strip CHECK BLOOD SUGAR ONCE DAILY 100 each 1  . metFORMIN (GLUCOPHAGE-XR) 500 MG 24 hr tablet TAKE 2 TABLETS TWICE A DAY WITH MEALS FOR DIABETES 360 tablet 1  . metoprolol succinate (TOPROL-XL) 50 MG 24 hr tablet TAKE 1 TABLET BY MOUTH DAILY. TAKE WITH OR IMMEDIATELY FOLLOWING A MEAL. 90 tablet 0  . simvastatin (ZOCOR) 40 MG tablet TAKE 1 TABLET BY MOUTH AT BEDTIME FOR CHOLESTEROL 90 tablet 1  . warfarin (COUMADIN) 5 MG tablet TAKE 1/2 TO 1 TABLET BY MOUTH DAILY OR AS DIRECTED 90 tablet 0   No current facility-administered medications on file prior to visit.     Allergies: No Known Allergies  Current Problems (verified) has Long term current use of anticoagulant therapy; H/O mechanical aortic valve replacement; Hyperlipidemia associated with type 2 diabetes mellitus ([place]); Type II diabetes mellitus with nephropathy ([place]); Vitamin D deficiency; Vitamin B12 deficiency; Essential hypertension; Medication management; Coronary artery disease due to lipid rich plaque; Hx of CABG; Encounter for Medicare annual wellness exam; Obesity (BMI 30.0-34.9); CKD stage 2 due to type 2 diabetes mellitus ([place]); Atrial fibrillation ([place]); GERD (gastroesophageal reflux disease); Ascending aortic aneurysm ([place]); and Aortic atherosclerosis (HCC) on their problem list.  Screening Tests Immunization History  Administered Date(s) Administered  . Fluad Quad(high Dose 65+) 10/14/2020  . Influenza Whole 09/01/2012  . Influenza, High Dose Seasonal PF 09/27/2015, 10/14/2020  . Influenza-Unspecified 08/25/2016, 08/24/2018, 09/13/2019  . PFIZER Comirnaty(Gray Top)Covid-19 Tri-Sucrose Vaccine 12/18/2020  . PFIZER(Purple Top)SARS-COV-2 Vaccination 03/18/2020, 04/13/2020  . Pneumococcal Conjugate-13 12/18/2015  . Pneumococcal Polysaccharide-23 09/01/2012  . Tdap 11/25/2007    Preventative care: Last colonoscopy: 2011  Cologuard: 11/2018 negative   Prior vaccinations: TD or Tdap: 2009 DUE patient declines Influenza: 09/2020  Pneumococcal: 2013 Prevnar13: 2017 Shingles/Zostavax: declines Covid 19: 3/3, pfizer   Names of Other Physician/Practitioners you currently use: 1. [facility] here for primary care 2. Dr. Delman Cheadle, eye doctor, last visit 2021, report requested, reports has upcoming, will see Dr. Tyrone Schimke for bil cataracts thisyear  3. Dr. Delphia Grates, dentist, last visit 2021, goes q6m, DUE, looking for new dentist  Patient Care Team: Unk Pinto, MD as PCP - General (Internal Medicine) Debara Pickett Nadean Corwin, MD as PCP -  Cardiology (Cardiology) Debara Pickett Nadean Corwin, MD as Consulting Physician (Cardiology) Laurence Spates, MD (Inactive) as Consulting Physician (Gastroenterology)  Surgical: He  has a past surgical history that includes Aortic valve replacement (07/18/2003); Hernia repair (09/11/2011); Aortic valve replacement (avr)/coronary artery bypass grafting (cabg) (07/18/2003); Cardiac catheterization (07/13/2003); TEE without cardioversion (07/18/2003); transthoracic echocardiogram (02/21/2013); and NM MYOCAR PERF WALL MOTION (02/12/2010). Family His family history includes Cancer in his brother and sister; Heart attack in his father; Heart disease in his father; Stroke in his mother. Social history  He reports that he has never smoked. He has never used smokeless tobacco. He reports that he does not drink alcohol and does not use drugs.  MEDICARE WELLNESS OBJECTIVES: Physical activity: Current Exercise Habits: The patient has a physically strenuous job, but has no regular exercise apart from work., Exercise limited by: None identified Cardiac risk  factors: Cardiac Risk Factors include: advanced age (>72men, >41 women);dyslipidemia;hypertension;male gender;diabetes mellitus;obesity (BMI >30kg/m2) Depression/mood screen:   Depression screen [facility] 2/9 04/02/2021  Decreased Interest 0  Down, Depressed, Hopeless 0  PHQ - 2 Score 0    ADLs:  In your present state of health, do you have any difficulty performing the following activities: 04/02/2021 01/03/2021  Hearing? N N  Vision? N N  Difficulty concentrating or making decisions? N N  Walking or climbing stairs? N N  Dressing or bathing? N N  Doing errands, shopping? N N  Some recent data might be hidden     Cognitive Testing  Alert? Yes  Normal Appearance?Yes  Oriented to person? Yes  Place? Yes   Time? Yes  Recall of three objects?  Yes  Can perform simple calculations? Yes  Displays appropriate judgment?Yes  Can read the correct time from a watch  face?Yes  EOL planning: Does Patient Have a Medical Advance Directive?: Yes Type of Advance Directive: [place] will Does patient want to make changes to medical advance directive?: No - Patient declined Copy of [place] in Chart?: No - copy requested   Objective:   Today's Vitals   04/02/21 1040  BP: 140/82  Pulse: 68  Temp: (!) 97.3 F (36.3 C)  SpO2: 96%  Weight: 205 lb (93 kg)   Body mass index is 30.72 kg/m.  General appearance: alert, no distress, WD/WN, male HEENT: normocephalic, sclerae anicteric, TMs pearly, nares patent, no discharge or erythema, pharynx normal Oral cavity: MMM, no lesions Neck: supple, no lymphadenopathy, no thyromegaly, no masses Heart: Irregularly irregular, normal S1, S2, no murmurs Lungs: CTA bilaterally, no wheezes, rhonchi, or rales Abdomen: +bs, soft, non tender, non distended, no masses, no hepatomegaly, no splenomegaly Musculoskeletal: nontender, no swelling, no obvious deformity Extremities: no edema, no cyanosis, no clubbing Pulses: 2+ symmetric, upper and lower extremities, normal cap refill Neurological: alert, oriented x 3, CN2-12 intact, strength normal upper extremities and lower extremities, sensation normal throughout, DTRs 2+ throughout, no cerebellar signs, gait normal Psychiatric: normal affect, behavior normal, pleasant   Medicare Attestation I have personally reviewed: The patient's medical and social history Their use of alcohol, tobacco or illicit drugs Their current medications and supplements The patient's functional ability including ADLs,fall risks, home safety risks, cognitive, and hearing and visual impairment Diet and physical activities Evidence for depression or mood disorders  The patient's weight, height, BMI, and visual acuity have been recorded in the chart.  I have made referrals, counseling, and provided education to the patient based on review of the above  and I have provided the patient with a written personalized care plan for preventive services.     Izora Ribas, NP   04/02/2021

## 2021-04-02 ENCOUNTER — Ambulatory Visit (INDEPENDENT_AMBULATORY_CARE_PROVIDER_SITE_OTHER): Payer: PPO | Admitting: Adult Health

## 2021-04-02 ENCOUNTER — Other Ambulatory Visit: Payer: Self-pay

## 2021-04-02 ENCOUNTER — Encounter: Payer: Self-pay | Admitting: Adult Health

## 2021-04-02 VITALS — BP 140/82 | HR 68 | Temp 97.3°F | Wt 205.0 lb

## 2021-04-02 DIAGNOSIS — R6889 Other general symptoms and signs: Secondary | ICD-10-CM

## 2021-04-02 DIAGNOSIS — N182 Chronic kidney disease, stage 2 (mild): Secondary | ICD-10-CM

## 2021-04-02 DIAGNOSIS — I10 Essential (primary) hypertension: Secondary | ICD-10-CM | POA: Diagnosis not present

## 2021-04-02 DIAGNOSIS — I700 Atherosclerosis of aorta: Secondary | ICD-10-CM

## 2021-04-02 DIAGNOSIS — Z951 Presence of aortocoronary bypass graft: Secondary | ICD-10-CM

## 2021-04-02 DIAGNOSIS — E1121 Type 2 diabetes mellitus with diabetic nephropathy: Secondary | ICD-10-CM

## 2021-04-02 DIAGNOSIS — E669 Obesity, unspecified: Secondary | ICD-10-CM

## 2021-04-02 DIAGNOSIS — I482 Chronic atrial fibrillation, unspecified: Secondary | ICD-10-CM | POA: Diagnosis not present

## 2021-04-02 DIAGNOSIS — Z79899 Other long term (current) drug therapy: Secondary | ICD-10-CM

## 2021-04-02 DIAGNOSIS — I712 Thoracic aortic aneurysm, without rupture: Secondary | ICD-10-CM

## 2021-04-02 DIAGNOSIS — Z952 Presence of prosthetic heart valve: Secondary | ICD-10-CM

## 2021-04-02 DIAGNOSIS — E785 Hyperlipidemia, unspecified: Secondary | ICD-10-CM | POA: Diagnosis not present

## 2021-04-02 DIAGNOSIS — I2583 Coronary atherosclerosis due to lipid rich plaque: Secondary | ICD-10-CM

## 2021-04-02 DIAGNOSIS — D6869 Other thrombophilia: Secondary | ICD-10-CM

## 2021-04-02 DIAGNOSIS — Z0000 Encounter for general adult medical examination without abnormal findings: Secondary | ICD-10-CM

## 2021-04-02 DIAGNOSIS — Z7901 Long term (current) use of anticoagulants: Secondary | ICD-10-CM | POA: Diagnosis not present

## 2021-04-02 DIAGNOSIS — E1122 Type 2 diabetes mellitus with diabetic chronic kidney disease: Secondary | ICD-10-CM

## 2021-04-02 DIAGNOSIS — K219 Gastro-esophageal reflux disease without esophagitis: Secondary | ICD-10-CM

## 2021-04-02 DIAGNOSIS — I2510 Atherosclerotic heart disease of native coronary artery without angina pectoris: Secondary | ICD-10-CM

## 2021-04-02 DIAGNOSIS — E1169 Type 2 diabetes mellitus with other specified complication: Secondary | ICD-10-CM | POA: Diagnosis not present

## 2021-04-02 DIAGNOSIS — Z0001 Encounter for general adult medical examination with abnormal findings: Secondary | ICD-10-CM | POA: Diagnosis not present

## 2021-04-02 DIAGNOSIS — E559 Vitamin D deficiency, unspecified: Secondary | ICD-10-CM

## 2021-04-02 DIAGNOSIS — I7121 Aneurysm of the ascending aorta, without rupture: Secondary | ICD-10-CM

## 2021-04-02 DIAGNOSIS — E66811 Obesity, class 1: Secondary | ICD-10-CM

## 2021-04-02 NOTE — Patient Instructions (Addendum)
Billy Reilly , Thank you for taking time to come for your Medicare Wellness Visit. I appreciate your ongoing commitment to your health goals. Please review the following plan we discussed and let me know if I can assist you in the future.   These are the goals we discussed: Goals    . Blood Pressure < 130/80    . HEMOGLOBIN A1C < 8    . LDL CALC < 70    . Weight (lb) < 200 lb (90.7 kg)       This is a list of the screening recommended for you and due dates:  Health Maintenance  Topic Date Due  . Eye exam for diabetics  11/14/2020  . Tetanus Vaccine  04/02/2022*  . Hepatitis C Screening: USPSTF Recommendation to screen - Ages 18-79 yo.  04/02/2022*  . Flu Shot  06/24/2021  . Hemoglobin A1C  07/03/2021  . Complete foot exam   01/03/2022  . COVID-19 Vaccine  Completed  . Pneumonia vaccines  Completed  . HPV Vaccine  Aged Out  *Topic was postponed. The date shown is not the original due date.     High-Fiber Eating Plan Fiber, also called dietary fiber, is a type of carbohydrate. It is found foods such as fruits, vegetables, whole grains, and beans. A high-fiber diet can have many health benefits. Your health care provider may recommend a high-fiber diet to help:  Prevent constipation. Fiber can make your bowel movements more regular.  Lower your cholesterol.  Relieve the following conditions: ? Inflammation of veins in the anus (hemorrhoids). ? Inflammation of specific areas of the digestive tract (uncomplicated diverticulosis). ? A problem of the large intestine, also called the colon, that sometimes causes pain and diarrhea (irritable bowel syndrome, or IBS).  Prevent overeating as part of a weight-loss plan.  Prevent heart disease, type 2 diabetes, and certain cancers. What are tips for following this plan? Reading food labels  Check the nutrition facts label on food products for the amount of dietary fiber. Choose foods that have 5 grams of fiber or more per serving.  The  goals for recommended daily fiber intake include: ? Men (age 46 or younger): 34-38 g. ? Men (over age 38): 28-34 g. ? Women (age 25 or younger): 25-28 g. ? Women (over age 79): 22-25 g. Your daily fiber goal is _____________ g.   Shopping  Choose whole fruits and vegetables instead of processed forms, such as apple juice or applesauce.  Choose a wide variety of high-fiber foods such as avocados, lentils, oats, and kidney beans.  Read the nutrition facts label of the foods you choose. Be aware of foods with added fiber. These foods often have high sugar and sodium amounts per serving. Cooking  Use whole-grain flour for baking and cooking.  Cook with brown rice instead of white rice. Meal planning  Start the day with a breakfast that is high in fiber, such as a cereal that contains 5 g of fiber or more per serving.  Eat breads and cereals that are made with whole-grain flour instead of refined flour or white flour.  Eat brown rice, bulgur wheat, or millet instead of white rice.  Use beans in place of meat in soups, salads, and pasta dishes.  Be sure that half of the grains you eat each day are whole grains. General information  You can get the recommended daily intake of dietary fiber by: ? Eating a variety of fruits, vegetables, grains, nuts, and beans. ?  Taking a fiber supplement if you are not able to take in enough fiber in your diet. It is better to get fiber through food than from a supplement.  Gradually increase how much fiber you consume. If you increase your intake of dietary fiber too quickly, you may have bloating, cramping, or gas.  Drink plenty of water to help you digest fiber.  Choose high-fiber snacks, such as berries, raw vegetables, nuts, and popcorn. What foods should I eat? Fruits Berries. Pears. Apples. Oranges. Avocado. Prunes and raisins. Dried figs. Vegetables Sweet potatoes. Spinach. Kale. Artichokes. Cabbage. Broccoli. Cauliflower. Green peas.  Carrots. Squash. Grains Whole-grain breads. Multigrain cereal. Oats and oatmeal. Brown rice. Barley. Bulgur wheat. [place]. Quinoa. Bran muffins. Popcorn. Rye wafer crackers. Meats and other proteins Navy beans, kidney beans, and pinto beans. Soybeans. Split peas. Lentils. Nuts and seeds. Dairy Fiber-fortified yogurt. Beverages Fiber-fortified soy milk. Fiber-fortified orange juice. Other foods Fiber bars. The items listed above may not be a complete list of recommended foods and beverages. Contact a dietitian for more information. What foods should I avoid? Fruits Fruit juice. Cooked, strained fruit. Vegetables Fried potatoes. Canned vegetables. Well-cooked vegetables. Grains White bread. Pasta made with refined flour. White rice. Meats and other proteins Fatty cuts of meat. Fried chicken or fried fish. Dairy Milk. Yogurt. Cream cheese. Sour cream. Fats and oils Butters. Beverages Soft drinks. Other foods Cakes and pastries. The items listed above may not be a complete list of foods and beverages to avoid. Talk with your dietitian about what choices are best for you. Summary  Fiber is a type of carbohydrate. It is found in foods such as fruits, vegetables, whole grains, and beans.  A high-fiber diet has many benefits. It can help to prevent constipation, lower blood cholesterol, aid weight loss, and reduce your risk of heart disease, diabetes, and certain cancers.  Increase your intake of fiber gradually. Increasing fiber too quickly may cause cramping, bloating, and gas. Drink plenty of water while you increase the amount of fiber you consume.  The best sources of fiber include whole fruits and vegetables, whole grains, nuts, seeds, and beans. This information is not intended to replace advice given to you by your health care provider. Make sure you discuss any questions you have with your health care provider. Document Revised: 03/15/2020 Document Reviewed:  03/15/2020 Elsevier Patient Education  2021 [employer].

## 2021-04-03 ENCOUNTER — Other Ambulatory Visit: Payer: Self-pay | Admitting: Adult Health

## 2021-04-03 ENCOUNTER — Encounter: Payer: Self-pay | Admitting: Adult Health

## 2021-04-03 LAB — COMPLETE METABOLIC PANEL WITHOUT GFR
AG Ratio: 2 (calc) (ref 1.0–2.5)
ALT: 18 U/L (ref 9–46)
AST: 17 U/L (ref 10–35)
Albumin: 4.3 g/dL (ref 3.6–5.1)
Alkaline phosphatase (APISO): 94 U/L (ref 35–144)
BUN/Creatinine Ratio: 25 (calc) — ABNORMAL HIGH (ref 6–22)
BUN: 17 mg/dL (ref 7–25)
CO2: 23 mmol/L (ref 20–32)
Calcium: 9.1 mg/dL (ref 8.6–10.3)
Chloride: 108 mmol/L (ref 98–110)
Creat: 0.68 mg/dL — ABNORMAL LOW (ref 0.70–1.18)
GFR, Est African American: 106 mL/min/{1.73_m2}
GFR, Est Non African American: 91 mL/min/{1.73_m2}
Globulin: 2.2 g/dL (ref 1.9–3.7)
Glucose, Bld: 165 mg/dL — ABNORMAL HIGH (ref 65–99)
Potassium: 4.8 mmol/L (ref 3.5–5.3)
Sodium: 141 mmol/L (ref 135–146)
Total Bilirubin: 0.9 mg/dL (ref 0.2–1.2)
Total Protein: 6.5 g/dL (ref 6.1–8.1)

## 2021-04-03 LAB — CBC WITH DIFFERENTIAL/PLATELET
Absolute Monocytes: 439 {cells}/uL (ref 200–950)
Basophils Absolute: 51 {cells}/uL (ref 0–200)
Basophils Relative: 0.9 %
Eosinophils Absolute: 108 {cells}/uL (ref 15–500)
Eosinophils Relative: 1.9 %
HCT: 49.8 % (ref 38.5–50.0)
Hemoglobin: 16.6 g/dL (ref 13.2–17.1)
Lymphs Abs: 1020 {cells}/uL (ref 850–3900)
MCH: 31.8 pg (ref 27.0–33.0)
MCHC: 33.3 g/dL (ref 32.0–36.0)
MCV: 95.4 fL (ref 80.0–100.0)
MPV: 11.4 fL (ref 7.5–12.5)
Monocytes Relative: 7.7 %
Neutro Abs: 4081 {cells}/uL (ref 1500–7800)
Neutrophils Relative %: 71.6 %
Platelets: 146 10*3/uL (ref 140–400)
RBC: 5.22 Million/uL (ref 4.20–5.80)
RDW: 12.6 % (ref 11.0–15.0)
Total Lymphocyte: 17.9 %
WBC: 5.7 10*3/uL (ref 3.8–10.8)

## 2021-04-03 LAB — HEMOGLOBIN A1C
Hgb A1c MFr Bld: 7.6 %{Hb} — ABNORMAL HIGH
Mean Plasma Glucose: 171 mg/dL
eAG (mmol/L): 9.5 mmol/L

## 2021-04-03 LAB — LIPID PANEL
Cholesterol: 130 mg/dL
HDL: 43 mg/dL
LDL Cholesterol (Calc): 72 mg/dL
Non-HDL Cholesterol (Calc): 87 mg/dL
Total CHOL/HDL Ratio: 3 (calc)
Triglycerides: 73 mg/dL

## 2021-04-03 LAB — MAGNESIUM: Magnesium: 2.2 mg/dL (ref 1.5–2.5)

## 2021-04-03 LAB — TSH: TSH: 1.87 m[IU]/L (ref 0.40–4.50)

## 2021-04-03 MED ORDER — ROSUVASTATIN CALCIUM 10 MG PO TABS: 10.0000 mg | ORAL_TABLET | Freq: Every day | ORAL | 3 refills | Status: DC

## 2021-04-09 ENCOUNTER — Ambulatory Visit: Payer: PPO | Admitting: Adult Health

## 2021-04-09 LAB — POCT INR: INR: 2.7 (ref 2.0–3.0)

## 2021-04-10 ENCOUNTER — Ambulatory Visit (INDEPENDENT_AMBULATORY_CARE_PROVIDER_SITE_OTHER): Payer: PPO | Admitting: Cardiology

## 2021-04-10 DIAGNOSIS — Z952 Presence of prosthetic heart valve: Secondary | ICD-10-CM

## 2021-04-10 DIAGNOSIS — Z7901 Long term (current) use of anticoagulants: Secondary | ICD-10-CM

## 2021-04-18 DIAGNOSIS — H25013 Cortical age-related cataract, bilateral: Secondary | ICD-10-CM | POA: Diagnosis not present

## 2021-04-18 DIAGNOSIS — H2513 Age-related nuclear cataract, bilateral: Secondary | ICD-10-CM | POA: Diagnosis not present

## 2021-04-23 IMAGING — CT CT ANGIO CHEST
3 of 6 series · 18 of 46 positions shown · IV contrast (omnipaque)
Comparison: None available

CLINICAL DATA: Ascending thoracic aortic aneurysm, follow-up.
Previous aortic valve replacement surgery.

EXAM:
CT ANGIOGRAPHY CHEST WITH CONTRAST
TECHNIQUE: Multidetector CT imaging of the chest was performed using the
standard protocol during bolus administration of intravenous
contrast. Multiplanar CT image reconstructions and MIPs were
obtained to evaluate the vascular anatomy.
CONTRAST:  100mL OMNIPAQUE IOHEXOL 350 MG/ML SOLN

[Series 5: axial arterial · axial · arterial · 0.79mm/px · z∈[-345,-72]mm · 11 of 111 slices shown]
[im 10/111  lung]
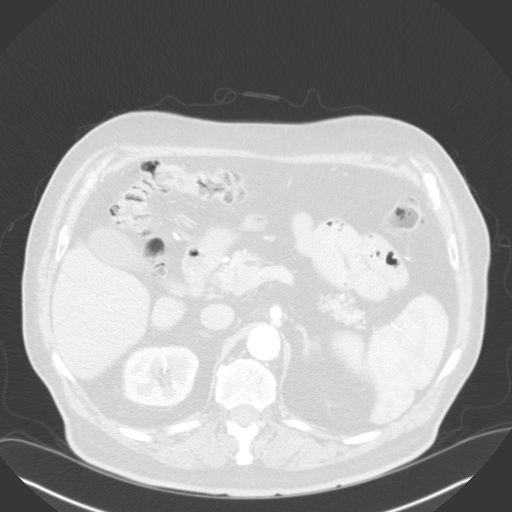
[im 19/111  soft-tissue]
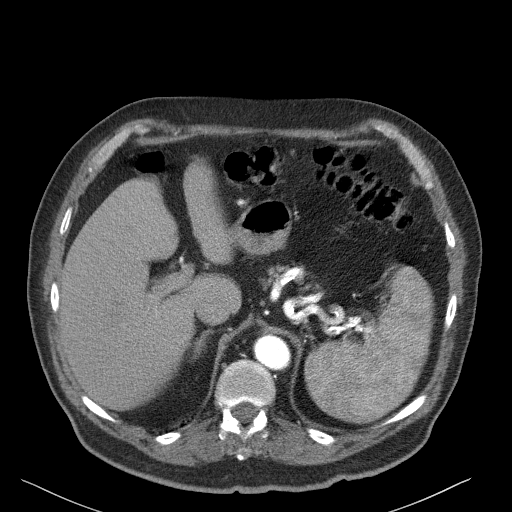
[im 28/111  lung]
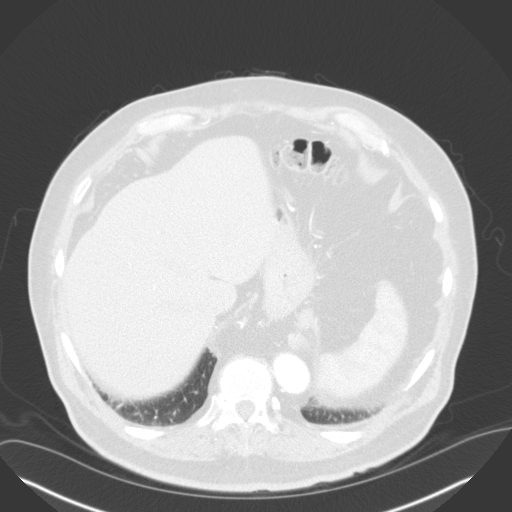
[im 37/111  soft-tissue]
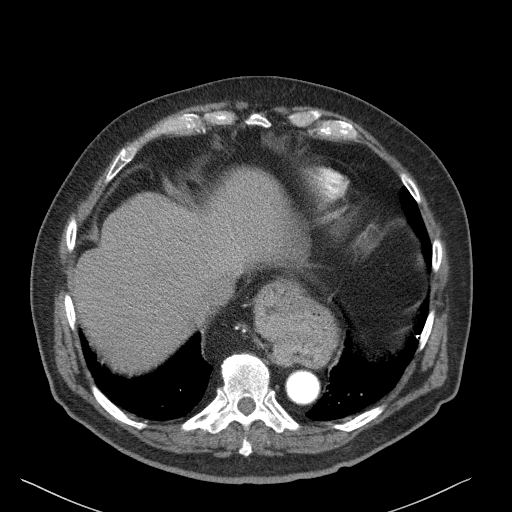
[im 46/111  lung]
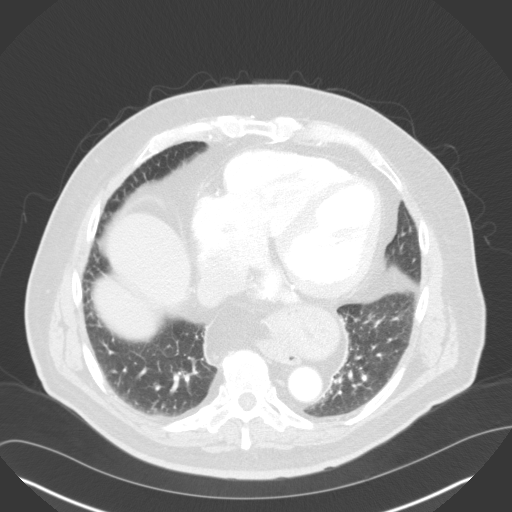
[im 56/111  soft-tissue]
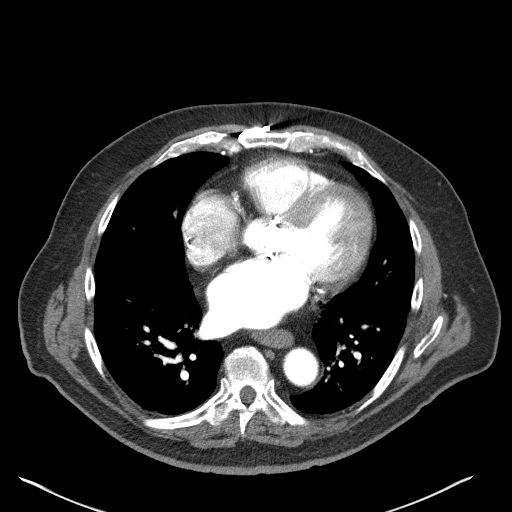
[im 65/111  lung]
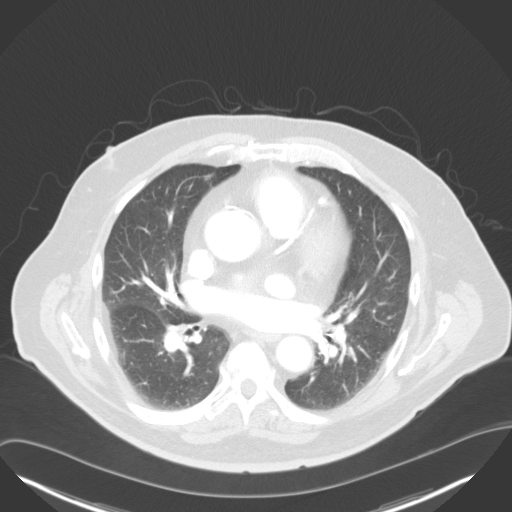
[im 74/111  soft-tissue]
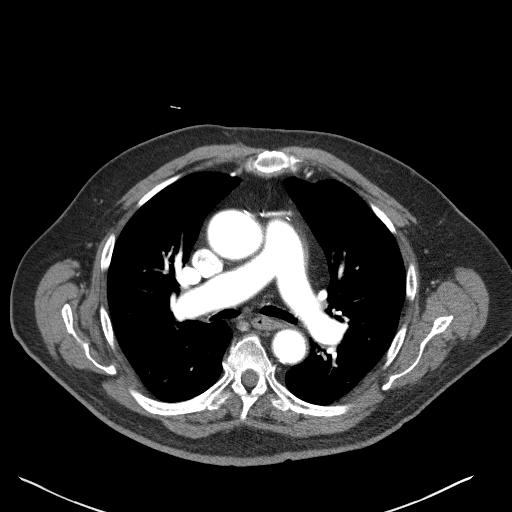
[im 83/111  lung]
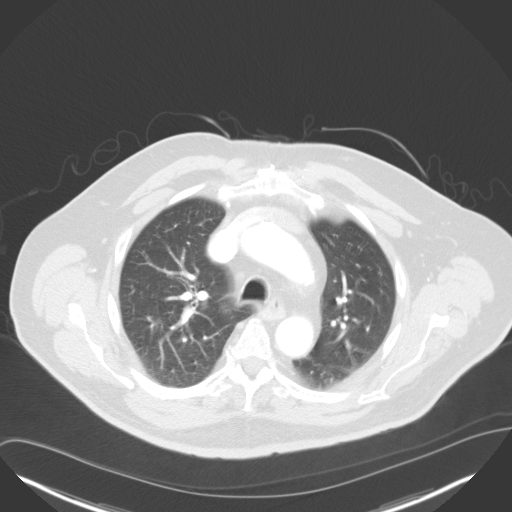
[im 92/111  soft-tissue]
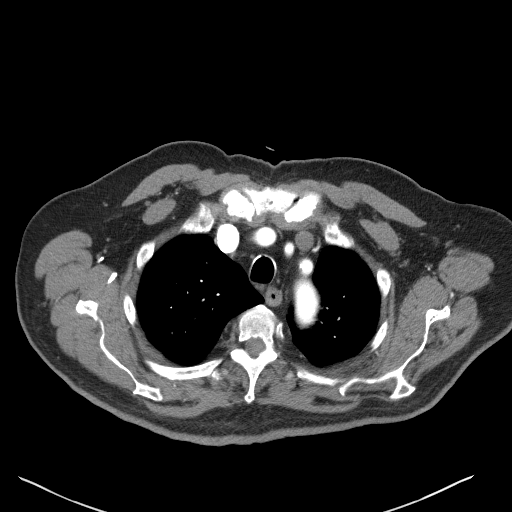
[im 101/111  lung]
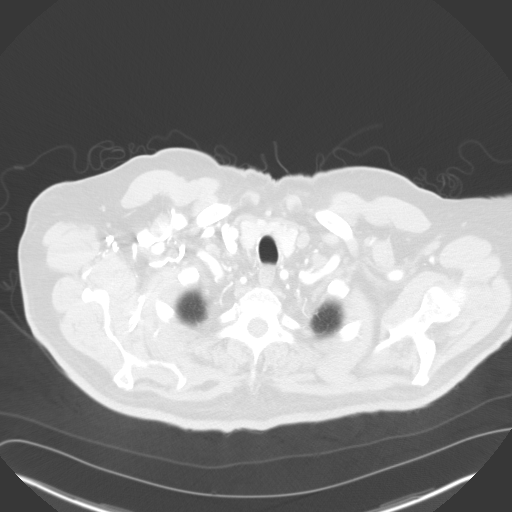

[Series 7: lung · axial · 0.79mm/px · z∈[-338,-228]mm · 4 of 167 slices shown]
[im 19/167  soft-tissue]
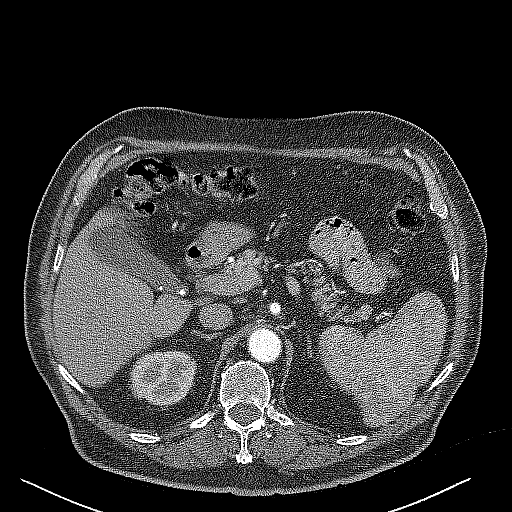
[im 37/167  soft-tissue]
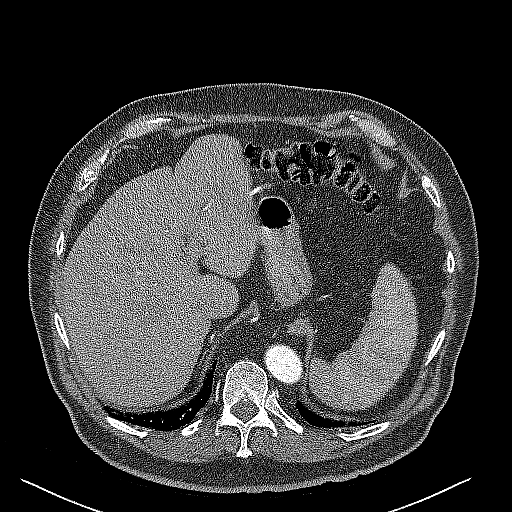
[im 56/167  soft-tissue]
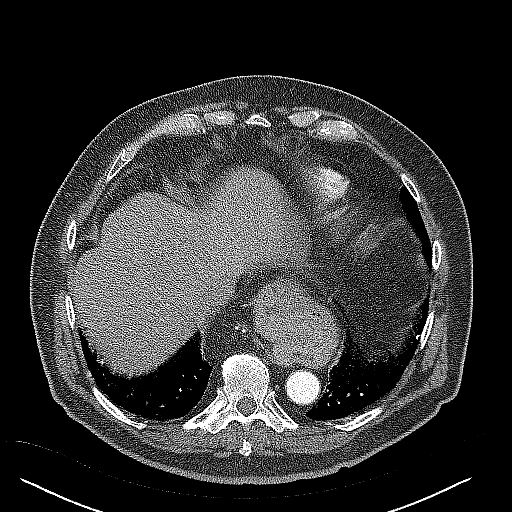
[im 74/167  soft-tissue]
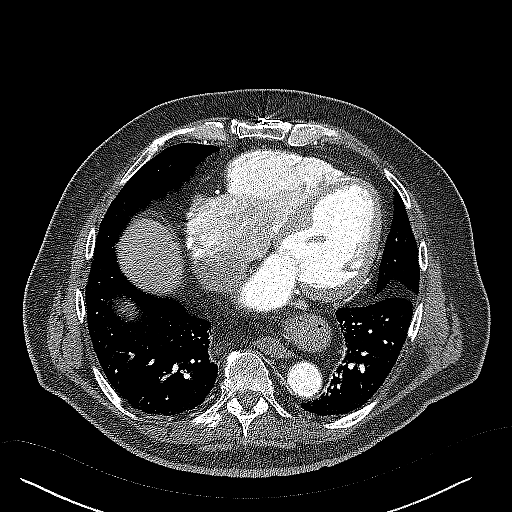

[Series 8: coronal · coronal · 0.68mm/px · 3 of 100 slices shown]
[im 25/100  soft-tissue]
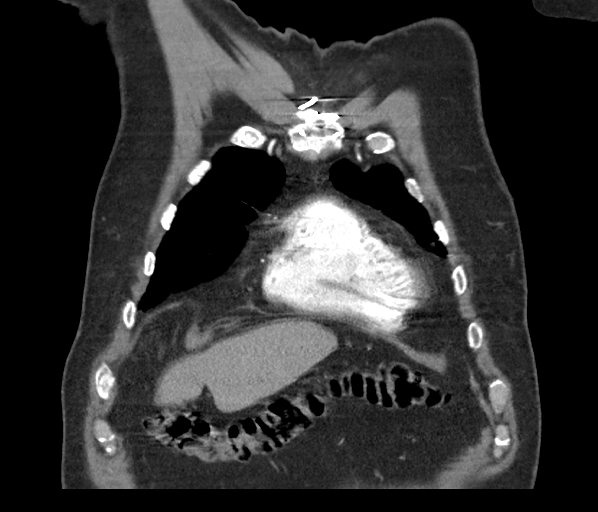
[im 50/100  soft-tissue]
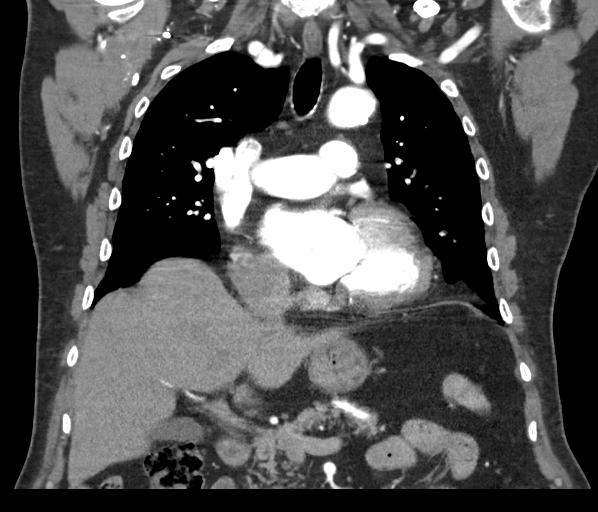
[im 75/100  soft-tissue]
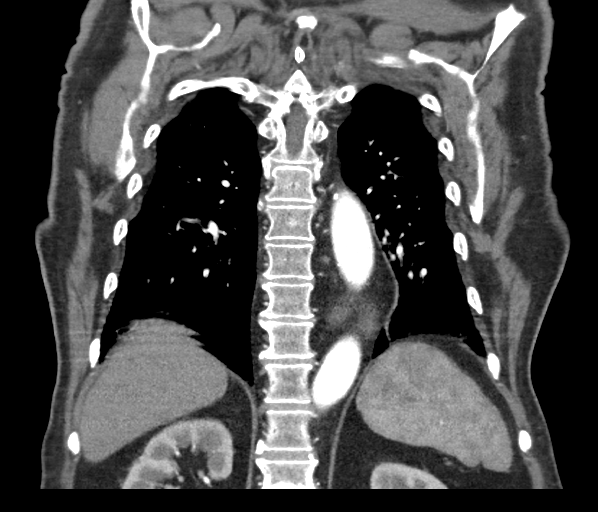

[18 of 46 positions shown; findings below may reference images not displayed]

FINDINGS: Cardiovascular: Mild four-chamber cardiac enlargement. No
pericardial effusion. Mildly dilated central pulmonary arteries. No
filling defects to suggest acute PE. Changes of mechanical AVR.
3-vessel coronary calcifications, post CABG . Good contrast
opacification of the thoracic aorta. No dissection or stenosis.

Aortic Root:

--Valve: 2.7 cm

--Sinuses: 3.6 cm

--Sinotubular Junction: 2.7 cm

Limitations by motion: Mild

Thoracic Aorta:

--Ascending Aorta: 4 cm

--Aortic Arch: 3.3 cm

--Descending Aorta: 3 cm

Mild scattered calcified atheromatous plaque. Classic 3 vessel
brachiocephalic arterial origin anatomy without proximal stenosis.
Visualized proximal abdominal aorta mildly atheromatous, nondilated.

Mediastinum/Nodes: Moderate hiatal hernia involving fundus. No mass
or adenopathy.

Lungs/Pleura: No pleural effusion.  No pneumothorax.  Lungs clear.

Upper Abdomen: Moderate hiatal hernia. Subcentimeter partially
calcified stones in the dependent aspect of the nondilated
gallbladder. No acute findings.

Musculoskeletal: Sternotomy wires. No fracture or worrisome bone
lesion.

Review of the MIP images confirms the above findings.
IMPRESSION: 1. 4 cm ascending thoracic aortic aneurysm without complicating
features. Recommend annual imaging followup by CTA or MRA. This
recommendation follows 2777
ACCF/AHA/AATS/ACR/ASA/SCA/DEEQA RAYAAN/PHEMI/THREES/SENECA Guidelines for the
Diagnosis and Management of Patients with Thoracic Aortic Disease.
Circulation. 2777; 121: [id number]
2. Dilated central pulmonary arteries suggesting pulmonary arterial
hypertension.
3. Moderate hiatal hernia.
4. Cholelithiasis.

Aortic Atherosclerosis (ZQJNH-6NG.G). Aortic aneurysm NOS
([id number].T).

## 2021-04-25 ENCOUNTER — Ambulatory Visit (INDEPENDENT_AMBULATORY_CARE_PROVIDER_SITE_OTHER): Payer: PPO | Admitting: Cardiology

## 2021-04-25 DIAGNOSIS — Z7901 Long term (current) use of anticoagulants: Secondary | ICD-10-CM

## 2021-04-25 DIAGNOSIS — Z952 Presence of prosthetic heart valve: Secondary | ICD-10-CM

## 2021-04-25 LAB — POCT INR: INR: 2.5 (ref 2.0–3.0)

## 2021-05-07 ENCOUNTER — Other Ambulatory Visit: Payer: Self-pay

## 2021-05-07 ENCOUNTER — Ambulatory Visit: Payer: PPO | Admitting: Physician Assistant

## 2021-05-07 ENCOUNTER — Encounter: Payer: Self-pay | Admitting: Physician Assistant

## 2021-05-07 ENCOUNTER — Ambulatory Visit (INDEPENDENT_AMBULATORY_CARE_PROVIDER_SITE_OTHER): Payer: PPO | Admitting: Pharmacist Clinician (PhC)/ Clinical Pharmacy Specialist

## 2021-05-07 VITALS — BP 152/72 | HR 72 | Ht 69.0 in | Wt 206.0 lb

## 2021-05-07 DIAGNOSIS — I25810 Atherosclerosis of coronary artery bypass graft(s) without angina pectoris: Secondary | ICD-10-CM | POA: Diagnosis not present

## 2021-05-07 DIAGNOSIS — I4811 Longstanding persistent atrial fibrillation: Secondary | ICD-10-CM

## 2021-05-07 DIAGNOSIS — Z952 Presence of prosthetic heart valve: Secondary | ICD-10-CM

## 2021-05-07 DIAGNOSIS — E119 Type 2 diabetes mellitus without complications: Secondary | ICD-10-CM

## 2021-05-07 DIAGNOSIS — E785 Hyperlipidemia, unspecified: Secondary | ICD-10-CM

## 2021-05-07 DIAGNOSIS — Z01818 Encounter for other preprocedural examination: Secondary | ICD-10-CM | POA: Diagnosis not present

## 2021-05-07 DIAGNOSIS — I10 Essential (primary) hypertension: Secondary | ICD-10-CM | POA: Diagnosis not present

## 2021-05-07 DIAGNOSIS — Z7901 Long term (current) use of anticoagulants: Secondary | ICD-10-CM

## 2021-05-07 LAB — POCT INR: INR: 1.9 — AB (ref 2.0–3.0)

## 2021-05-07 MED ORDER — METOPROLOL SUCCINATE ER 50 MG PO TB24: 75.0000 mg | ORAL_TABLET | Freq: Every day | ORAL | 3 refills | Status: DC

## 2021-05-07 NOTE — Progress Notes (Signed)
Cardiology Office Note:    Date:  05/09/2021   ID:  Billy Reilly, DOB 1941/10/30, MRN [id number]  PCP:  Unk Pinto, MD   [place] Providers Cardiologist:  [name], MD     Referring MD: Unk Pinto, MD   Chief Complaint  Patient presents with   Follow-up    Annual follow up, DOT visit    History of Present Illness:    Billy Reilly is a 80 y.o. male with a hx of CAD s/p 2v CABG (SVG-diag 1, SVG-PDA) in 2004 along with mechanical aortic valve replacement, prostate cancer, hypertension, hyperlipidemia, DM 2, atrial flutter and TAA.  He will require regular Coumadin check.  He works for [employer] and [employer].  Last Myoview obtained in March 2016 was low risk with EF of 56%, no significant reversible ischemia.  He was noted to be in new atrial flutter during the previous visit in January 2021.  Echocardiogram obtained on 12/27/2019 showed EF 65 to 70%, severe LAE, mild LVH, trivial MR, mechanical prosthetic aortic valve sitting correctly, moderately dilated ascending aorta measuring 44 mm. Due to the severely enlarged left atrium seen on echocardiogram, rate control strategy was pursued. 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    Patient presents today for follow-up.  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  He is clearly able to accomplish more than 4 METS of activity, he is cleared to drive commercial vehicles.  My only question is how frequently he will need a stress test.  I asked him to double check with DOD office to see if he is due for a another stress test, if so we can arrange ahead at the time.  His blood pressure is elevated, I increased his Toprol-XL from the 50 mg daily to 75 mg daily.  EKG shows he is still in atrial fibrillation, rate controlled.  He denies any cardiac awareness of atrial fibrillation.  He has no  shortness of breath or chest discomfort.  He can follow-up with Dr. Debara Pickett in 6 to 9 months.  He is due for a CT angio aorta to follow-up on thoracic aortic aneurysm in August of this year.  I will defer to Dr. Debara Pickett to decide whether to repeat echocardiogram around February of next year.  Past Medical History:  Diagnosis Date   Anemia    AS (aortic stenosis)    valve replacement-st. jude mechanical valve   ASHD (arteriosclerotic heart disease)    CAD (coronary artery disease), native coronary artery    CABG 07/18/2003   Cancer (HCC)    prostate   GERD (gastroesophageal reflux disease)    History of nuclear stress test 02/12/10   EF 67%, normal perfusion   Hyperlipidemia    Hypertension    Morbid obesity ([place])    Type II or unspecified type diabetes mellitus without mention of complication, not stated as uncontrolled    Umbilical hernia    Vitamin B12 deficiency    Vitamin D deficiency     Past Surgical History:  Procedure Laterality Date   AORTIC VALVE REPLACEMENT  07/18/2003   53mm ATS-AP valve   AORTIC VALVE REPLACEMENT (AVR)/CORONARY ARTERY BYPASS GRAFTING (CABG)  07/18/2003   AVR; bypass x 2-SVG to first diag, SVG to posterior descending   CARDIAC CATHETERIZATION  07/13/2003   AS-aortic valve gradient 50mmhg, valve area 1.1; obstruction of ostium  of posterior lateral branch with obstruction in the ostium of first [place]  93/71/6967   RIH, umbilical   NM MYOCAR PERF WALL MOTION  02/12/2010   protocol:Bruce, EF 67%, exercise cap 7 METS, low risk scan   TEE WITHOUT CARDIOVERSION  07/18/2003   no evidence of PE, Aortic valve heavily calcified,    TRANSTHORACIC ECHOCARDIOGRAM  02/21/2013   EF60-65%, Septal motion showed paradox. atrium moderately dilated    Current Medications: Current Meds  Medication Sig   aspirin 81 MG tablet Take 81 mg by mouth daily.   CHOLECALCIFEROL PO Take 2,000 Units by mouth daily. Takes 2-3 days per week currently   CINNAMON PO  Take 500 mg by mouth in the morning and at bedtime.    clotrimazole-betamethasone (LOTRISONE) cream Apply to rash 2 to 3 x /day   cyanocobalamin (,VITAMIN B-12,) 1000 MCG/ML injection INJECT 1/2 ML EVERY MONTH   empagliflozin (JARDIANCE) 25 MG TABS tablet Take      1 tablet     Daily         for Diabetes   enalapril (VASOTEC) 20 MG tablet TAKE 2 TABLETS BY MOUTH EVERY DAY   famotidine (PEPCID) 20 MG tablet TAKE 1 TABLET BY MOUTH TWICE A DAY WITH MEALS FOR ACIDE INDIGESTION   glipiZIDE (GLUCOTROL) 10 MG tablet TAKE 1 TABLET 3 X /DAY BEFORE MEALS FOR DIABETES   glucose blood (ONE TOUCH ULTRA TEST) test strip CHECK BLOOD SUGAR ONCE DAILY   metFORMIN (GLUCOPHAGE-XR) 500 MG 24 hr tablet TAKE 2 TABLETS TWICE A DAY WITH MEALS FOR DIABETES   rosuvastatin (CRESTOR) 10 MG tablet Take 1 tablet (10 mg total) by mouth daily.   warfarin (COUMADIN) 5 MG tablet TAKE 1/2 TO 1 TABLET BY MOUTH DAILY OR AS DIRECTED   [DISCONTINUED] metoprolol succinate (TOPROL-XL) 50 MG 24 hr tablet TAKE 1 TABLET BY MOUTH DAILY. TAKE WITH OR IMMEDIATELY FOLLOWING A MEAL.     Allergies:   Patient has no known allergies.   Social History   Socioeconomic History   Marital status: Married    Spouse name: Not on file   Number of children: Not on file   Years of education: Not on file   Highest education level: Not on file  Occupational History   Not on file  Tobacco Use   Smoking status: Never   Smokeless tobacco: Never  Substance and Sexual Activity   Alcohol use: No   Drug use: No   Sexual activity: Not on file  Other Topics Concern   Not on file  Social History Narrative   Not on file   Social Determinants of Health   Financial Resource Strain: Not on file  Food Insecurity: Not on file  Transportation Needs: Not on file  Physical Activity: Not on file  Stress: Not on file  Social Connections: Not on file     Family History: The patient's family history includes Cancer in his brother and sister; Heart  attack in his father; Heart disease in his father; Stroke in his mother.  ROS:   Please see the history of present illness.     All other systems reviewed and are negative.  EKGs/Labs/Other Studies Reviewed:    The following studies were reviewed today:  Echo 12/27/2019 1. Left ventricular ejection fraction, by visual estimation, is 65 to  70%. The left ventricle has hyperdynamic function. There is mildly  increased left ventricular hypertrophy.   2. Left ventricular diastolic function could not be  evaluated.   3. The left ventricle has no regional wall motion abnormalities.   4. Global right ventricle has normal systolic function.The right  ventricular size is normal. No increase in right ventricular wall  thickness.   5. Left atrial size was severely dilated.   6. Right atrial size was moderately dilated.   7. The mitral valve is abnormal. Trivial mitral valve regurgitation.   8. The tricuspid valve is grossly normal.   9. The tricuspid valve is grossly normal. Tricuspid valve regurgitation  is trivial.  10. Aortic valve area, by VTI measures 1.62 cm.  11. Aortic valve mean gradient measures 18.0 mmHg.  12. Aortic valve peak gradient measures 31.9 mmHg.  13. Aortic valve regurgitation is trivial.  14. Mechanical prosthesis in the aortic valve position.  15. The pulmonic valve was grossly normal. Pulmonic valve regurgitation is  not visualized.  16. Aortic dilatation noted.  17. There is moderate dilatation of the ascending aorta measuring 44 mm.  18. The inferior vena cava is normal in size with greater than 50%  respiratory variability, suggesting right atrial pressure of 3 mmHg.  19. Changes from prior study are noted.  20. Previous EF was 55-60%, mechanical AVR gradient of 20 mmHg mean, aorta  was not dilated.  21. Moderately dilated ascending aorta, newly noted - recommend CT  aortogram in 6 months.   EKG:  EKG is ordered today.  The ekg ordered today demonstrates  atrial fibrillation, heart rate 73 bpm.  Recent Labs: 04/02/2021: ALT 18; BUN 17; Creat 0.68; Hemoglobin 16.6; Magnesium 2.2; Platelets 146; Potassium 4.8; Sodium 141; TSH 1.87  Recent Lipid Panel    Component Value Date/Time   CHOL 130 04/02/2021 1112   TRIG 73 04/02/2021 1112   HDL 43 04/02/2021 1112   CHOLHDL 3.0 04/02/2021 1112   VLDL 20 04/07/2017 1126   LDLCALC 72 04/02/2021 1112     Risk Assessment/Calculations:    CHA2DS2-VASc Score = 5  This indicates a 7.2% annual risk of stroke. The patient's score is based upon: CHF History: No HTN History: Yes Diabetes History: Yes Stroke History: No Vascular Disease History: Yes Age Score: 2 Gender Score: 0     Physical Exam:    VS:  BP (!) 152/72   Pulse 72   Ht 5\' 9"  (1.753 m)   Wt 206 lb (93.4 kg)   SpO2 96%   BMI 30.42 kg/m     Wt Readings from Last 3 Encounters:  05/07/21 206 lb (93.4 kg)  04/02/21 205 lb (93 kg)  01/03/21 205 lb (93 kg)     GEN:  Well nourished, well developed in no acute distress HEENT: Normal NECK: No JVD; No carotid bruits LYMPHATICS: No lymphadenopathy CARDIAC: Irregularly irregular, no murmurs, rubs, gallops RESPIRATORY:  Clear to auscultation without rales, wheezing or rhonchi  ABDOMEN: Soft, non-tender, non-distended MUSCULOSKELETAL:  No edema; No deformity  SKIN: Warm and dry NEUROLOGIC:  Alert and oriented x 3 PSYCHIATRIC:  Normal affect   ASSESSMENT:    1. Coronary artery disease involving coronary bypass graft of native heart without angina pectoris   2. Pre-procedural examination   3. H/O mechanical aortic valve replacement   4. Essential hypertension   5. Hyperlipidemia LDL goal <70   6. Controlled type 2 diabetes mellitus without complication, without long-term current use of insulin ([place])   7. Longstanding persistent atrial fibrillation (HCC)    PLAN:    In order of problems listed above:  CAD s/p CABG: Denies any  recent chest pain.  He drives commercial  vehicle.  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  He is clearly able to accomplish more than 4 METS of activity.  Only question remains whether he will need a repeat Myoview.  This will be up to the DOT requirement.  History of mechanical aortic valve replacement: On chronic Coumadin therapy.  Consider repeat echocardiogram in February of next year  Hyperlipidemia: On Crestor  DM2: Managed by primary care provider  Longstanding persistent atrial fibrillation: Continue Coumadin therapy.  Rate controlled on metoprolol.  Blood pressure elevated today, will increase metoprolol to 75 mg daily.     Medication Adjustments/Labs and Tests Ordered: Current medicines are reviewed at length with the patient today.  Concerns regarding medicines are outlined above.  Orders Placed This Encounter  Procedures   Basic metabolic panel   EKG [id number]   Meds ordered this encounter  Medications   metoprolol succinate (TOPROL-XL) 50 MG 24 hr tablet    Sig: Take 1.5 tablets (75 mg total) by mouth daily. TAKE WITH OR IMMEDIATELY FOLLOWING A MEAL.    Dispense:  135 tablet    Refill:  3    Patient Instructions  Medication Instructions:  INCREASE Metoprolol Succinate (Toprol-XL) to 75 mg (1.5 tablets) Daily  *If you need a refill on your cardiac medications before your next appointment, please call your pharmacy*   Lab Work: NONE ordered at this time of appointment   If you have labs (blood work) drawn today and your tests are completely normal, you will receive your results only by: [place] (if you have MyChart) OR A paper copy in the mail If you have any lab test that is abnormal or we need to change your treatment, we will call you to review the results.   Testing/Procedures: NONE ordered at this time of appointment   Follow-Up: At [facility], you and your health needs are our priority.  As part of our continuing mission to provide  you with exceptional heart care, we have created designated Provider Care Teams.  These Care Teams include your primary Cardiologist (physician) and Advanced Practice Providers (APPs -  Physician Assistants and Nurse Practitioners) who all work together to provide you with the care you need, when you need it.  We recommend signing up for the patient portal called "MyChart".  Sign up information is provided on this After Visit Summary.  MyChart is used to connect with patients for Virtual Visits (Telemedicine).  Patients are able to view lab/test results, encounter notes, upcoming appointments, etc.  Non-urgent messages can be sent to your provider as well.   To learn more about what you can do with MyChart, go to [email or web address].    Your next appointment:   6-9 month(s)  The format for your next appointment:   In Person  Provider:   Raliegh Ip Mali Hilty, MD  Other Instructions    Signed, Billy Reilly, [place]  05/09/2021 10:26 PM    [place]

## 2021-05-07 NOTE — Patient Instructions (Signed)
Medication Instructions:  INCREASE Metoprolol Succinate (Toprol-XL) to 75 mg (1.5 tablets) Daily  *If you need a refill on your cardiac medications before your next appointment, please call your pharmacy*   Lab Work: NONE ordered at this time of appointment   If you have labs (blood work) drawn today and your tests are completely normal, you will receive your results only by: [place] (if you have MyChart) OR A paper copy in the mail If you have any lab test that is abnormal or we need to change your treatment, we will call you to review the results.   Testing/Procedures: NONE ordered at this time of appointment   Follow-Up: At [facility], you and your health needs are our priority.  As part of our continuing mission to provide you with exceptional heart care, we have created designated Provider Care Teams.  These Care Teams include your primary Cardiologist (physician) and Advanced Practice Providers (APPs -  Physician Assistants and Nurse Practitioners) who all work together to provide you with the care you need, when you need it.  We recommend signing up for the patient portal called "MyChart".  Sign up information is provided on this After Visit Summary.  MyChart is used to connect with patients for Virtual Visits (Telemedicine).  Patients are able to view lab/test results, encounter notes, upcoming appointments, etc.  Non-urgent messages can be sent to your provider as well.   To learn more about what you can do with MyChart, go to [email or web address].    Your next appointment:   6-9 month(s)  The format for your next appointment:   In Person  Provider:   K. Mali Hilty, MD  Other Instructions

## 2021-05-09 ENCOUNTER — Encounter: Payer: Self-pay | Admitting: Physician Assistant

## 2021-05-14 ENCOUNTER — Ambulatory Visit: Payer: PPO | Admitting: Adult Health Nurse Practitioner

## 2021-05-14 ENCOUNTER — Ambulatory Visit: Payer: PPO | Admitting: Adult Health

## 2021-05-21 DIAGNOSIS — H2512 Age-related nuclear cataract, left eye: Secondary | ICD-10-CM | POA: Diagnosis not present

## 2021-05-21 DIAGNOSIS — H25012 Cortical age-related cataract, left eye: Secondary | ICD-10-CM | POA: Diagnosis not present

## 2021-05-21 DIAGNOSIS — H25812 Combined forms of age-related cataract, left eye: Secondary | ICD-10-CM | POA: Diagnosis not present

## 2021-05-25 DIAGNOSIS — Z952 Presence of prosthetic heart valve: Secondary | ICD-10-CM | POA: Diagnosis not present

## 2021-05-25 DIAGNOSIS — I480 Paroxysmal atrial fibrillation: Secondary | ICD-10-CM | POA: Diagnosis not present

## 2021-05-25 DIAGNOSIS — Z7901 Long term (current) use of anticoagulants: Secondary | ICD-10-CM | POA: Diagnosis not present

## 2021-05-25 DIAGNOSIS — Z5181 Encounter for therapeutic drug level monitoring: Secondary | ICD-10-CM | POA: Diagnosis not present

## 2021-05-26 LAB — POCT INR: INR: 1.8 — AB (ref 2.0–3.0)

## 2021-05-30 ENCOUNTER — Ambulatory Visit (INDEPENDENT_AMBULATORY_CARE_PROVIDER_SITE_OTHER): Payer: PPO | Admitting: Cardiovascular Disease

## 2021-05-30 DIAGNOSIS — Z952 Presence of prosthetic heart valve: Secondary | ICD-10-CM

## 2021-05-30 DIAGNOSIS — Z7901 Long term (current) use of anticoagulants: Secondary | ICD-10-CM

## 2021-06-05 ENCOUNTER — Other Ambulatory Visit: Payer: Self-pay | Admitting: Internal Medicine

## 2021-06-10 ENCOUNTER — Ambulatory Visit (INDEPENDENT_AMBULATORY_CARE_PROVIDER_SITE_OTHER): Payer: PPO | Admitting: Cardiovascular Disease

## 2021-06-10 DIAGNOSIS — Z7901 Long term (current) use of anticoagulants: Secondary | ICD-10-CM

## 2021-06-10 DIAGNOSIS — Z952 Presence of prosthetic heart valve: Secondary | ICD-10-CM

## 2021-06-13 LAB — POCT INR: INR: 2.4 (ref 2.0–3.0)

## 2021-06-26 LAB — POCT INR: INR: 2.6 (ref 2.0–3.0)

## 2021-06-27 ENCOUNTER — Telehealth: Payer: Self-pay

## 2021-06-27 NOTE — Telephone Encounter (Signed)
Lmom for Warfarin overdue inr self tester

## 2021-06-28 ENCOUNTER — Ambulatory Visit (INDEPENDENT_AMBULATORY_CARE_PROVIDER_SITE_OTHER): Payer: PPO | Admitting: Cardiovascular Disease

## 2021-06-28 DIAGNOSIS — Z7901 Long term (current) use of anticoagulants: Secondary | ICD-10-CM

## 2021-06-28 DIAGNOSIS — Z952 Presence of prosthetic heart valve: Secondary | ICD-10-CM

## 2021-07-02 ENCOUNTER — Other Ambulatory Visit: Payer: Self-pay

## 2021-07-02 ENCOUNTER — Ambulatory Visit (HOSPITAL_COMMUNITY)
Admission: RE | Admit: 2021-07-02 | Discharge: 2021-07-02 | Disposition: A | Discharge status: Home / Self Care | Payer: PPO | Source: Ambulatory Visit | Attending: Internal Medicine | Admitting: Internal Medicine

## 2021-07-02 DIAGNOSIS — I712 Thoracic aortic aneurysm, without rupture, unspecified: Secondary | ICD-10-CM

## 2021-07-02 DIAGNOSIS — I77810 Thoracic aortic ectasia: Secondary | ICD-10-CM | POA: Diagnosis present

## 2021-07-02 DIAGNOSIS — K449 Diaphragmatic hernia without obstruction or gangrene: Secondary | ICD-10-CM | POA: Diagnosis not present

## 2021-07-02 LAB — POCT I-STAT CREATININE: Creatinine, Ser: 0.6 mg/dL — ABNORMAL LOW (ref 0.61–1.24)

## 2021-07-02 MED ORDER — IOHEXOL 350 MG/ML SOLN
75.0000 mL | Freq: Once | INTRAVENOUS | Status: AC | PRN
  Administered 2021-07-02: 75 mL via INTRAVENOUS

## 2021-07-04 ENCOUNTER — Other Ambulatory Visit: Payer: Self-pay | Admitting: Internal Medicine

## 2021-07-04 DIAGNOSIS — I712 Thoracic aortic aneurysm, without rupture, unspecified: Secondary | ICD-10-CM

## 2021-07-09 ENCOUNTER — Other Ambulatory Visit: Payer: Self-pay

## 2021-07-09 ENCOUNTER — Encounter: Payer: Self-pay | Admitting: Internal Medicine

## 2021-07-09 ENCOUNTER — Ambulatory Visit (INDEPENDENT_AMBULATORY_CARE_PROVIDER_SITE_OTHER): Payer: PPO | Admitting: Internal Medicine

## 2021-07-09 VITALS — BP 138/76 | HR 85 | Temp 97.8°F | Resp 16 | Ht 69.0 in | Wt 206.8 lb

## 2021-07-09 DIAGNOSIS — Z79899 Other long term (current) drug therapy: Secondary | ICD-10-CM

## 2021-07-09 DIAGNOSIS — I2583 Coronary atherosclerosis due to lipid rich plaque: Secondary | ICD-10-CM

## 2021-07-09 DIAGNOSIS — I700 Atherosclerosis of aorta: Secondary | ICD-10-CM | POA: Diagnosis not present

## 2021-07-09 DIAGNOSIS — I2510 Atherosclerotic heart disease of native coronary artery without angina pectoris: Secondary | ICD-10-CM

## 2021-07-09 DIAGNOSIS — E1122 Type 2 diabetes mellitus with diabetic chronic kidney disease: Secondary | ICD-10-CM | POA: Diagnosis not present

## 2021-07-09 DIAGNOSIS — E559 Vitamin D deficiency, unspecified: Secondary | ICD-10-CM | POA: Diagnosis not present

## 2021-07-09 DIAGNOSIS — I482 Chronic atrial fibrillation, unspecified: Secondary | ICD-10-CM

## 2021-07-09 DIAGNOSIS — I7121 Aneurysm of the ascending aorta, without rupture: Secondary | ICD-10-CM

## 2021-07-09 DIAGNOSIS — N186 End stage renal disease: Secondary | ICD-10-CM

## 2021-07-09 DIAGNOSIS — I10 Essential (primary) hypertension: Secondary | ICD-10-CM

## 2021-07-09 DIAGNOSIS — I712 Thoracic aortic aneurysm, without rupture, unspecified: Secondary | ICD-10-CM

## 2021-07-09 DIAGNOSIS — E785 Hyperlipidemia, unspecified: Secondary | ICD-10-CM | POA: Diagnosis not present

## 2021-07-09 DIAGNOSIS — Z952 Presence of prosthetic heart valve: Secondary | ICD-10-CM | POA: Diagnosis not present

## 2021-07-09 DIAGNOSIS — N182 Chronic kidney disease, stage 2 (mild): Secondary | ICD-10-CM | POA: Diagnosis not present

## 2021-07-09 DIAGNOSIS — E1169 Type 2 diabetes mellitus with other specified complication: Secondary | ICD-10-CM

## 2021-07-09 LAB — PROTIME-INR: INR: 2.2 — AB (ref 0.9–1.1)

## 2021-07-09 NOTE — Patient Instructions (Signed)

## 2021-07-09 NOTE — Progress Notes (Signed)
Future Appointments  Date Time Provider [place]  07/09/2021 11:30 AM Unk Pinto, MD [facility] None  10/24/2021 10:45 AM [name], MD [facility]  04/02/2022 11:00 AM Liane Comber, NP [facility] None    History of Present Illness:       This very nice 80 y.o. MWM presents for 3 month follow up with HTN, ASHD/ /CABG/AoVR,  HLD, T2_NIDDM and Vitamin D Deficiency. In Aug 2021, CTA revealed Aortic Atherosclerosis & Asc Ao Aneurysm being monitored by Cardiology.        Patient is treated for HTN & BP has been controlled at home. Today's BP was initially slightly elevated & rechecked at goal - 138/76.  In 2004, patient underwent 2V CABG & St Jude AoVR and has been on coumadin since.  In 2016, patient had a Negative Myoview. In 2021, he was discovered in A Flutter. Patient has had no complaints of any cardiac type chest pain, palpitations, dyspnea / orthopnea / PND, dizziness, claudication, or dependent edema.       Hyperlipidemia is controlled with diet & meds. Patient denies myalgias or other med SE's. 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  Last A1c was not at goal:  Lab Results  Component Value Date   HGBA1C 7.6 (H) 04/02/2021        Further, the patient also has history of Vitamin D Deficiency ("28" /2012) and he does not supplement Vitamin D as advised.  Last vitamin D was still low:  Lab Results  Component Value Date   VD25OH 25 (L) 07/05/2020     Current Outpatient Medications on File Prior to Visit  Medication Sig   aspirin 81 MG tablet Take  daily.   Vitamin D  Take 2,000 Units 2-3 days per week currently   CINNAMON 500 mg  Take in the  morning and at bedtime.    LOTRISONE cream Apply to rash 2 to 3 x /day   VITAMIN B-12  1000 MCG/ML injec INJECT 1/2 ML EVERY MONTH   JARDIANCE) 25 MG TABS tablet Take      1 tablet     Daily         for Diabetes   enalapril  20 MG tablet TAKE 2 TABLETS EVERY DAY   famotidine 20 MG tablet TAKE 1 TABLET TWICE A DAY    glipiZIDE 10 MG tablet TAKE 1 TABLET 3 X /DAY BEFORE MEALS    metFORMIN-XR 500 MG  TAKE 2 TABLETS TWICE A DAY WITH MEALS    metoprolol succinate-XL 50 MG 24 hr tablet Take 1.5 tablets daily   rosuvastatin  10 MG tablet Take 1 tablet  daily.   warfarin  5 MG tablet TAKE 1/2 TO 1 TABLET DAILY OR AS DIRECTED    No Known Allergies   PMHx:   Past Medical History:  Diagnosis Date   Anemia    AS (aortic stenosis)    valve replacement-st. jude mechanical valve   ASHD (arteriosclerotic heart disease)    CAD (coronary artery disease), native coronary artery    CABG 07/18/2003   Cancer [facility])    prostate  GERD (gastroesophageal reflux disease)    History of nuclear stress test 02/12/10   EF 67%, normal perfusion   Hyperlipidemia    Hypertension    Morbid obesity ([place])    Type II or unspecified type diabetes mellitus without mention of complication, not stated as uncontrolled    Umbilical hernia    Vitamin B12 deficiency    Vitamin D deficiency      Immunization History  Administered Date(s) Administered   Fluad Quad(high Dose 65+) 10/14/2020   Influenza Whole 09/01/2012   Influenza, High Dose Seasonal PF 09/27/2015, 10/14/2020   Influenza-Unspecified 08/25/2016, 08/24/2018, 09/13/2019   PFIZER Comirnaty(Gray Top)Covid-19 Tri-Sucrose Vaccine 12/18/2020   PFIZER(Purple Top)SARS-COV-2 Vaccination 03/18/2020, 04/13/2020   Pneumococcal Conjugate-13 12/18/2015   Pneumococcal Polysaccharide-23 09/01/2012   Tdap 11/25/2007     Past Surgical History:  Procedure Laterality Date   AORTIC VALVE REPLACEMENT  07/18/2003   39mm ATS-AP valve   AORTIC VALVE REPLACEMENT  (AVR)/CORONARY ARTERY BYPASS GRAFTING (CABG)  07/18/2003   AVR; bypass x 2-SVG to first diag, SVG to posterior descending   CARDIAC CATHETERIZATION  07/13/2003   AS-aortic valve gradient 23mmhg, valve area 1.1; obstruction of ostium of posterior lateral branch with obstruction in the ostium of first [place]  67/89/3810   RIH, umbilical   NM MYOCAR PERF WALL MOTION  02/12/2010   protocol:Bruce, EF 67%, exercise cap 7 METS, low risk scan   TEE WITHOUT CARDIOVERSION  07/18/2003   no evidence of PE, Aortic valve heavily calcified,    TRANSTHORACIC ECHOCARDIOGRAM  02/21/2013   EF60-65%, Septal motion showed paradox. atrium moderately dilated    FHx:    Reviewed / unchanged  SHx:    Reviewed / unchanged   Systems Review:  Constitutional: Denies fever, chills, wt changes, headaches, insomnia, fatigue, night sweats, change in appetite. Eyes: Denies redness, blurred vision, diplopia, discharge, itchy, watery eyes.  ENT: Denies discharge, congestion, post nasal drip, epistaxis, sore throat, earache, hearing loss, dental pain, tinnitus, vertigo, sinus pain, snoring.  CV: Denies chest pain, palpitations, irregular heartbeat, syncope, dyspnea, diaphoresis, orthopnea, PND, claudication or edema. Respiratory: denies cough, dyspnea, DOE, pleurisy, hoarseness, laryngitis, wheezing.  Gastrointestinal: Denies dysphagia, odynophagia, heartburn, reflux, water brash, abdominal pain or cramps, nausea, vomiting, bloating, diarrhea, constipation, hematemesis, melena, hematochezia  or hemorrhoids. Genitourinary: Denies dysuria, frequency, urgency, nocturia, hesitancy, discharge, hematuria or flank pain. Musculoskeletal: Denies arthralgias, myalgias, stiffness, jt. swelling, pain, limping or strain/sprain.  Skin: Denies pruritus, rash, hives, warts, acne, eczema or change in skin lesion(s). Neuro: No weakness, tremor, incoordination, spasms, paresthesia or pain. Psychiatric: Denies confusion,  memory loss or sensory loss. Endo: Denies change in weight, skin or hair change.  Heme/Lymph: No excessive bleeding, bruising or enlarged lymph nodes.  Physical Exam  BP 138/76   Pulse 85   Temp 97.8 F (36.6 C)   Resp 16   Ht 5\' 9"  (1.753 m)   Wt 206 lb 12.8 oz (93.8 kg)   SpO2 96%   BMI 30.54 kg/m   Appears  well nourished, well groomed  and in no distress.  Eyes: PERRLA, EOMs, conjunctiva no swelling or erythema. Sinuses: No frontal/maxillary tenderness ENT/Mouth: EAC's clear, TM's nl w/o erythema, bulging. Nares clear w/o erythema, swelling, exudates. Oropharynx clear without erythema or exudates. Oral hygiene is good. Tongue normal, non obstructing. Hearing intact.  Neck: Supple. Thyroid not palpable. Car 2+/2+ without bruits, nodes or JVD. Chest: Respirations nl with BS clear & equal w/o rales, rhonchi, wheezing or stridor.  Cor:  prosthetic valve clicks w/ regular rate and rhythm without sig. murmurs, gallops or rubs. Peripheral pulses normal and equal  without edema.  Abdomen: Soft & bowel sounds normal. Non-tender w/o guarding, rebound, hernias, masses or organomegaly.  Lymphatics: Unremarkable.  Musculoskeletal: Full ROM all peripheral extremities, joint stability, 5/5 strength and normal gait.  Skin: Warm, dry without exposed rashes, lesions or ecchymosis apparent.  Neuro: Cranial nerves intact, reflexes equal bilaterally. Sensory-motor testing grossly intact. Tendon reflexes grossly intact.  Pysch: Alert & oriented x 3.  Insight and judgement nl & appropriate. No ideations.  Assessment and Plan:  1. Essential hypertension  - Continue medication, monitor blood pressure at home.  - Continue DASH diet.  Reminder to go to the ER if any CP,  SOB, nausea, dizziness, severe HA, changes vision/speech.   - CBC with Differential/Platelet - COMPLETE METABOLIC PANEL WITH GFR - Magnesium - TSH  2. Hyperlipidemia associated with type 2 diabetes mellitus ([place])  - Continue  diet/meds, exercise,& lifestyle modifications.  - Continue monitor periodic cholesterol/liver & renal functions    - TSH  3. Type 2 diabetes mellitus with stage 2 chronic kidney  disease, without long-term current use of insulin (HCC)  - Continue diet, exercise  - Lifestyle modifications.  - Monitor appropriate labs   - Hemoglobin A1c - Insulin, random  4. Vitamin D deficiency  - Continue supplementation.   - VITAMIN D 25 Hydroxy  5. Coronary artery disease due to lipid rich plaque  - Lipid panel  6. Chronic atrial fibrillation (HCC)  - TSH  7. H/O mechanical aortic valve replacement   8. Aortic atherosclerosis (HCC)  - Lipid panel  9. Ascending aortic aneurysm ([place])   10. Medication management  - CBC with Differential/Platelet - COMPLETE METABOLIC PANEL WITH GFR - Magnesium - Lipid panel - TSH - Hemoglobin A1c - Insulin, random - VITAMIN D 25 Hydroxy         Discussed  regular exercise, BP monitoring, weight control to achieve/maintain BMI less than 25 and discussed med and SE's. Recommended labs to assess and monitor clinical status with further disposition pending results of labs.  I discussed the assessment and treatment plan with the patient. The patient was provided an opportunity to ask questions and all were answered. The patient agreed with the plan and demonstrated an understanding of the instructions.  I provided over 30 minutes of exam, counseling, chart review and  complex critical decision making.        The patient was advised to call back or seek an in-person evaluation if the symptoms worsen or if the condition fails to improve as anticipated.   Kirtland Bouchard, MD

## 2021-07-10 ENCOUNTER — Telehealth: Payer: Self-pay

## 2021-07-10 LAB — HEMOGLOBIN A1C
Hgb A1c MFr Bld: 8.4 %{Hb} — ABNORMAL HIGH
Mean Plasma Glucose: 194 mg/dL
eAG (mmol/L): 10.8 mmol/L

## 2021-07-10 LAB — COMPLETE METABOLIC PANEL WITHOUT GFR
AG Ratio: 1.8 (calc) (ref 1.0–2.5)
ALT: 18 U/L (ref 9–46)
AST: 16 U/L (ref 10–35)
Albumin: 4.2 g/dL (ref 3.6–5.1)
Alkaline phosphatase (APISO): 85 U/L (ref 35–144)
BUN: 9 mg/dL (ref 7–25)
CO2: 26 mmol/L (ref 20–32)
Calcium: 8.8 mg/dL (ref 8.6–10.3)
Chloride: 105 mmol/L (ref 98–110)
Creat: 0.74 mg/dL (ref 0.70–1.28)
Globulin: 2.3 g/dL (ref 1.9–3.7)
Glucose, Bld: 155 mg/dL — ABNORMAL HIGH (ref 65–99)
Potassium: 4.5 mmol/L (ref 3.5–5.3)
Sodium: 139 mmol/L (ref 135–146)
Total Bilirubin: 1 mg/dL (ref 0.2–1.2)
Total Protein: 6.5 g/dL (ref 6.1–8.1)
eGFR: 93 mL/min/{1.73_m2}

## 2021-07-10 LAB — CBC WITH DIFFERENTIAL/PLATELET
Absolute Monocytes: 461 {cells}/uL (ref 200–950)
Basophils Absolute: 32 {cells}/uL (ref 0–200)
Basophils Relative: 0.5 %
Eosinophils Absolute: 109 {cells}/uL (ref 15–500)
Eosinophils Relative: 1.7 %
HCT: 46.8 % (ref 38.5–50.0)
Hemoglobin: 16.5 g/dL (ref 13.2–17.1)
Lymphs Abs: 966 {cells}/uL (ref 850–3900)
MCH: 34.3 pg — ABNORMAL HIGH (ref 27.0–33.0)
MCHC: 35.3 g/dL (ref 32.0–36.0)
MCV: 97.3 fL (ref 80.0–100.0)
MPV: 12.1 fL (ref 7.5–12.5)
Monocytes Relative: 7.2 %
Neutro Abs: 4832 {cells}/uL (ref 1500–7800)
Neutrophils Relative %: 75.5 %
Platelets: 131 10*3/uL — ABNORMAL LOW (ref 140–400)
RBC: 4.81 Million/uL (ref 4.20–5.80)
RDW: 12.5 % (ref 11.0–15.0)
Total Lymphocyte: 15.1 %
WBC: 6.4 10*3/uL (ref 3.8–10.8)

## 2021-07-10 LAB — LIPID PANEL
Cholesterol: 130 mg/dL
HDL: 43 mg/dL
LDL Cholesterol (Calc): 70 mg/dL
Non-HDL Cholesterol (Calc): 87 mg/dL
Total CHOL/HDL Ratio: 3 (calc)
Triglycerides: 88 mg/dL

## 2021-07-10 LAB — TSH: TSH: 2.1 m[IU]/L (ref 0.40–4.50)

## 2021-07-10 LAB — MAGNESIUM: Magnesium: 2.2 mg/dL (ref 1.5–2.5)

## 2021-07-10 LAB — VITAMIN D 25 HYDROXY (VIT D DEFICIENCY, FRACTURES): Vit D, 25-Hydroxy: 25 ng/mL — ABNORMAL LOW (ref 30–100)

## 2021-07-10 LAB — INSULIN, RANDOM: Insulin: 5.9 u[IU]/mL

## 2021-07-10 NOTE — Telephone Encounter (Signed)
Lmom for overdur inr

## 2021-07-10 NOTE — Progress Notes (Signed)
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 ============================================================ ============================================================   - Avoid white rice  (brown & wild rice is OK),   - Avoid white potatoes  (sweet potatoes in moderation is OK),   White bread or wheat bread or anything made out of   white flour like bagels, donuts, rolls, buns, biscuits, cakes,  - pastries, cookies, pizza crust, and pasta (made from  white flour & egg whites)   - vegetarian pasta or spinach or wheat pasta is OK.  - Multigrain breads like Arnold's, Pepperidge Farm or   multigrain sandwich thins or high fiber breads like   [place] bread or "Dave's Killer" breads that are  4 to 5 grams fiber per slice !  are best.    Diet, exercise and weight loss can reverse and cure  diabetes in the early stages.    - Diet, exercise and weight loss is very important in the   control and prevention of complications of diabetes which  affects every system in your body, ie.   -Brain - dementia/stroke,  - eyes - glaucoma/blindness,  - heart - heart attack/heart failure,  - kidneys - dialysis,  - stomach - gastric paralysis,  - intestines - malabsorption,  - nerves - severe painful neuritis,  - circulation - gangrene & loss of a leg(s)  - and finally  . . . . . . . . . . . . . . . . .  .    - cancer and Alzheimers. ============================================================ ============================================================  -  Vitamin D = 25 - So obviously NOT taking   - Vitamin D goal is between 70-100.   - Please take Vitamin D 5,000 unit capsules x 2 capsules = 10,000 units /day   - It is very important as a natural anti-inflammatory and helping the  immune system protect against viral infections, like the Covid-19   - helping hair, skin, and nails, as well as reducing stroke and  heart attack risk.   - It helps your bones and helps with mood.  - It also decreases numerous cancer risks so please  take it as directed.   - Low Vit D is associated with a 200-300% higher risk for  CANCER   and 200-300% higher risk for HEART   ATTACK  &  STROKE.    - It is also associated with higher death rate at younger ages,   autoimmune diseases like Rheumatoid arthritis, Lupus,  Multiple Sclerosis.     - Also many other serious conditions, like depression, Alzheimer's  Dementia, infertility, muscle aches, fatigue, fibromyalgia   - just to name a few. ============================================================ ============================================================

## 2021-07-11 ENCOUNTER — Telehealth: Payer: Self-pay

## 2021-07-11 LAB — POCT INR: INR: 2.2 (ref 2.0–3.0)

## 2021-07-11 NOTE — Telephone Encounter (Signed)
Lmom for overdue inr 

## 2021-07-11 NOTE — Progress Notes (Signed)
This encounter was created in error - please disregard.

## 2021-07-12 ENCOUNTER — Ambulatory Visit (INDEPENDENT_AMBULATORY_CARE_PROVIDER_SITE_OTHER): Payer: PPO | Admitting: Cardiovascular Disease

## 2021-07-12 DIAGNOSIS — Z7901 Long term (current) use of anticoagulants: Secondary | ICD-10-CM

## 2021-07-12 DIAGNOSIS — Z952 Presence of prosthetic heart valve: Secondary | ICD-10-CM

## 2021-07-25 ENCOUNTER — Telehealth: Payer: Self-pay

## 2021-07-25 NOTE — Telephone Encounter (Signed)
Lmom for overdue inr 

## 2021-07-26 ENCOUNTER — Ambulatory Visit (INDEPENDENT_AMBULATORY_CARE_PROVIDER_SITE_OTHER): Payer: PPO | Admitting: Cardiovascular Disease

## 2021-07-26 DIAGNOSIS — Z7901 Long term (current) use of anticoagulants: Secondary | ICD-10-CM | POA: Diagnosis not present

## 2021-07-26 DIAGNOSIS — I480 Paroxysmal atrial fibrillation: Secondary | ICD-10-CM | POA: Diagnosis not present

## 2021-07-26 DIAGNOSIS — Z5181 Encounter for therapeutic drug level monitoring: Secondary | ICD-10-CM | POA: Diagnosis not present

## 2021-07-26 DIAGNOSIS — Z952 Presence of prosthetic heart valve: Secondary | ICD-10-CM

## 2021-07-26 LAB — POCT INR: INR: 2 (ref 2.0–3.0)

## 2021-08-01 LAB — POCT INR: INR: 2 (ref 2.0–3.0)

## 2021-08-02 ENCOUNTER — Ambulatory Visit (INDEPENDENT_AMBULATORY_CARE_PROVIDER_SITE_OTHER): Payer: PPO | Admitting: Cardiovascular Disease

## 2021-08-02 DIAGNOSIS — Z5181 Encounter for therapeutic drug level monitoring: Secondary | ICD-10-CM

## 2021-08-02 NOTE — Patient Instructions (Signed)
Description   Called and spoke to pt and instructed him to continue the same dose of warfarin 1/2 a tablet daily except for 1 tablet on Mondays and Fridays. Recheck INR in 2 weeks. [facility] [phone]

## 2021-08-09 LAB — POCT INR: INR: 2.6 (ref 2.0–3.0)

## 2021-08-12 ENCOUNTER — Ambulatory Visit (INDEPENDENT_AMBULATORY_CARE_PROVIDER_SITE_OTHER): Payer: PPO | Admitting: Internal Medicine

## 2021-08-12 DIAGNOSIS — Z7901 Long term (current) use of anticoagulants: Secondary | ICD-10-CM

## 2021-08-12 DIAGNOSIS — Z952 Presence of prosthetic heart valve: Secondary | ICD-10-CM

## 2021-08-12 NOTE — Telephone Encounter (Signed)
This encounter was created in error - please disregard.

## 2021-08-23 ENCOUNTER — Telehealth: Payer: Self-pay

## 2021-08-23 NOTE — Telephone Encounter (Signed)
Informed pt to check INR today.  Verbalized understanding.

## 2021-08-24 LAB — POCT INR: INR: 2.1 (ref 2.0–3.0)

## 2021-08-26 ENCOUNTER — Telehealth: Payer: Self-pay

## 2021-08-26 NOTE — Telephone Encounter (Signed)
Lpmtcb with INR result. 

## 2021-08-27 ENCOUNTER — Ambulatory Visit (INDEPENDENT_AMBULATORY_CARE_PROVIDER_SITE_OTHER): Payer: PPO | Admitting: Cardiovascular Disease

## 2021-08-27 DIAGNOSIS — Z5181 Encounter for therapeutic drug level monitoring: Secondary | ICD-10-CM

## 2021-08-27 DIAGNOSIS — Z7901 Long term (current) use of anticoagulants: Secondary | ICD-10-CM | POA: Diagnosis not present

## 2021-08-27 DIAGNOSIS — Z952 Presence of prosthetic heart valve: Secondary | ICD-10-CM

## 2021-08-27 NOTE — Patient Instructions (Signed)
Description    Called and spoke to patient instructed him to continue the same dose of warfarin 1/2 a tablet daily except for 1 tablet on Mondays and Fridays. Recheck INR in 2 weeks. [facility] [phone]

## 2021-09-04 ENCOUNTER — Other Ambulatory Visit: Payer: Self-pay | Admitting: Internal Medicine

## 2021-09-05 ENCOUNTER — Other Ambulatory Visit: Payer: Self-pay | Admitting: Adult Health Nurse Practitioner

## 2021-09-05 ENCOUNTER — Other Ambulatory Visit: Payer: Self-pay | Admitting: Internal Medicine

## 2021-09-05 DIAGNOSIS — K219 Gastro-esophageal reflux disease without esophagitis: Secondary | ICD-10-CM

## 2021-09-08 ENCOUNTER — Other Ambulatory Visit: Payer: Self-pay | Admitting: Adult Health

## 2021-09-08 LAB — POCT INR: INR: 3.1 — AB (ref 2.0–3.0)

## 2021-09-10 ENCOUNTER — Ambulatory Visit (INDEPENDENT_AMBULATORY_CARE_PROVIDER_SITE_OTHER): Payer: PPO | Admitting: Internal Medicine

## 2021-09-10 DIAGNOSIS — Z7901 Long term (current) use of anticoagulants: Secondary | ICD-10-CM | POA: Diagnosis not present

## 2021-09-10 DIAGNOSIS — Z952 Presence of prosthetic heart valve: Secondary | ICD-10-CM

## 2021-09-10 DIAGNOSIS — Z5181 Encounter for therapeutic drug level monitoring: Secondary | ICD-10-CM

## 2021-09-10 NOTE — Patient Instructions (Signed)
Description    Called and spoke to patient instructed him to continue the same dose of warfarin 1/2 a tablet daily except for 1 tablet on Mondays and Fridays. Recheck INR in 2 weeks. [facility] [phone]

## 2021-09-21 ENCOUNTER — Other Ambulatory Visit: Payer: Self-pay | Admitting: Internal Medicine

## 2021-09-24 ENCOUNTER — Telehealth: Payer: Self-pay

## 2021-09-24 ENCOUNTER — Ambulatory Visit (INDEPENDENT_AMBULATORY_CARE_PROVIDER_SITE_OTHER): Payer: PPO | Admitting: Cardiovascular Disease

## 2021-09-24 DIAGNOSIS — Z5181 Encounter for therapeutic drug level monitoring: Secondary | ICD-10-CM | POA: Diagnosis not present

## 2021-09-24 LAB — POCT INR: INR: 2.6 (ref 2.0–3.0)

## 2021-09-24 NOTE — Patient Instructions (Signed)
Description    Called and spoke to patient instructed him to continue the same dose of warfarin 1/2 a tablet daily except for 1 tablet on Mondays and Fridays. Recheck INR in 2 weeks. [facility] [phone]

## 2021-09-24 NOTE — Telephone Encounter (Signed)
Lmom for due inr self tester °

## 2021-10-08 DIAGNOSIS — Z952 Presence of prosthetic heart valve: Secondary | ICD-10-CM | POA: Diagnosis not present

## 2021-10-08 DIAGNOSIS — Z7901 Long term (current) use of anticoagulants: Secondary | ICD-10-CM | POA: Diagnosis not present

## 2021-10-08 DIAGNOSIS — I480 Paroxysmal atrial fibrillation: Secondary | ICD-10-CM | POA: Diagnosis not present

## 2021-10-08 DIAGNOSIS — Z5181 Encounter for therapeutic drug level monitoring: Secondary | ICD-10-CM | POA: Diagnosis not present

## 2021-10-08 LAB — POCT INR: INR: 2.3 (ref 2.0–3.0)

## 2021-10-09 ENCOUNTER — Ambulatory Visit (INDEPENDENT_AMBULATORY_CARE_PROVIDER_SITE_OTHER): Payer: PPO | Admitting: Cardiovascular Disease

## 2021-10-09 DIAGNOSIS — Z952 Presence of prosthetic heart valve: Secondary | ICD-10-CM

## 2021-10-09 DIAGNOSIS — Z7901 Long term (current) use of anticoagulants: Secondary | ICD-10-CM

## 2021-10-21 NOTE — Progress Notes (Deleted)
FOLLOW UP 3 MONTH  Assessment:    Billy Reilly was seen today for follow-up.  Diagnoses and all orders for this visit:  Essential hypertension Continue current medications:Enalapril 20mg , metoprolol 50mg  daily Monitor blood pressure at home; call if consistently over 130/80 Continue DASH diet.   Reminder to go to the ER if any CP, SOB, nausea, dizziness, severe HA, changes vision/speech, left arm numbness and tingling and jaw pain. -     CBC with Differential/Platelet -     COMPLETE METABOLIC PANEL WITH GFR  Hyperlipidemia associated with type 2 diabetes mellitus (HCC) Continue medications: simvastatin 40mg  Discussed dietary and exercise modifications Low fat diet -     Lipid panel  Coronary artery disease due to lipid rich plaque Control blood pressure, lipids and glucose Disscused lifestyle modifications, diet & exercise Continue to monitor Follows with cardiology   Atrial fibrillation, unspecified type (HCC) Acquired thrombophilia (HCC) No concerns with excessive bleeding  No falls Continue medications: [facility] for management Discussed hospital precautions with patient Will continue to monitor  Type 2 diabetes mellitus with stage 2 chronic kidney disease, without long-term current use of insulin (HCC) Continue medications: Metformin 500mg  two tablets BID, empagliflozin 25mg , glipizide 10mg  one tab breakfast, 2 tabs lunch, 1 tablet dinner. Discussed general issues about diabetes pathophysiology and management. Education: Reviewed 'ABCs' of diabetes management (respective goals in parentheses):  A1C (<7), blood pressure (<130/80), and cholesterol (LDL <70) Dietary recommendations Encouraged aerobic exercise.  Discussed foot care, check daily Yearly retinal exam Dental exam every 6 months Monitor blood glucose, discussed goal for patient -     Hemoglobin A1c  CKD stage 2 due to type 2 diabetes mellitus (HCC) Increase fluids  Avoid NSAIDS Blood pressure  control Monitor sugars  Will continue to monitor  Obesity (BMI 30.0-34.9) Discussed dietary and exercise modifications  Type II diabetes mellitus with nephropathy ([place]) Discussed glucose control Discussed foot care, checks daily  Vitamin D deficiency Continue supplementation to maintain goal of 70-100 Taking Vitamin D 1,000 IU daily Defer vitamin D level  Need for influenza vaccination -     Flu vaccine HIGH DOSE PF  Candida infection -     clotrimazole-betamethasone (LOTRISONE) cream; Apply to rash 2 to 3 x /day  Medication management Continued    Further disposition pending results if labs check today. Discussed med's effects and SE's.   Over 30 minutes of face to face interview, exam, counseling, chart review, and critical decision making was performed.    Future Appointments  Date Time Provider [place]  10/22/2021 10:30 AM Magda Bernheim, NP [facility] None  10/24/2021 10:45 AM [name], MD [facility]  03/13/2022  2:00 PM Unk Pinto, MD [facility] None  04/02/2022 11:00 AM Liane Comber, NP [facility] None  06/24/2022  1:00 PM [facility]       Subjective:  Billy Reilly is a 80 y.o. male with hx of ASCAD/CABG/AoVR who presents for 3 month follow up for HTN,HLD, T2DM, and vitamin D Def.   He reports overall he is doing well. He is accompanied by his wife today.  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 No other purposful exercise.  He drinks two sodas, diet, a day and two glasses of tea, decaf, and a cup on coffee in the morning.    Wife's health is not doing well, advanced kidney disease and discussing starting dialysis.   He  has hx of CABG & Mech AoVR in 2004; he is on coumadin and followed by coumadin clinic. Nuclear Stress test was negative in 2016 by Dr Debara Pickett. He was recentlly found to be in a. Fib/flutter at cardiology, asymptomatic. Had follow up  ECHO showed severe/mod atrial enlargement with normal EF. Also incidentally noted ascending aortic aneurysm 4.4 cm which they plan to follow up with CTA in 6 months.   GERD is well controlled by famotidine 20 mg BID.   BMI is There is no height or weight on file to calculate BMI., he has been working on diet and exercise. Weight goal < 200 lb, he has been reducing breakfast meat, eggs, doing oatmeal instead, doing protein + veggies for meal whenever possible, limited evening intake.  He drinks diet mountain dew - <1 bottle daily. He drinks unsweet tea.  Wt Readings from Last 3 Encounters:  07/09/21 206 lb 12.8 oz (93.8 kg)  05/07/21 206 lb (93.4 kg)  04/02/21 205 lb (93 kg)   His blood pressure has been controlled at home, today their BP is   He does not workout. He denies chest pain, shortness of breath, dizziness.   He is on cholesterol medication (simvastatin 40 mg daily) and denies myalgias. His cholesterol is not at goal for LDL <70. The cholesterol last visit was:   Lab Results  Component Value Date   CHOL 130 07/09/2021   HDL 43 07/09/2021   LDLCALC 70 07/09/2021   TRIG 88 07/09/2021   CHOLHDL 3.0 07/09/2021   He has not been working on diet and exercise for T2DM (metformin 2000 mg daily, jardiance 25mg , glipizide 10mg  one tab breakfast,2tabs lunch 1 dinner.), and denies foot ulcerations, hypoglycemia , increased appetite, nausea, paresthesia of the feet, polydipsia, polyuria, visual disturbances, vomiting and weight loss. He checks a fasting glucose about once weekly, running "about 185." Has had hypoglycemia in the past, typically "has sweats" but none recently. Last A1C in the office was:  Lab Results  Component Value Date   HGBA1C 8.4 (H) 07/09/2021   He has CKD II associated with T2DM monitored at this office:  Lab Results  Component Value Date   GFRNONAA 91 04/02/2021    Patient is not on Vitamin D supplement and very low at recent check, admits has been forgetting to  take, but has been taking 5000 IU 2-3 times a week:    Lab Results  Component Value Date   VD25OH 25 (L) 07/09/2021      Medication Review: Current Outpatient Medications on File Prior to Visit  Medication Sig Dispense Refill   enalapril (VASOTEC) 20 MG tablet Take 2 tablets (40 mg total) by mouth daily. 180 tablet 1   aspirin 81 MG tablet Take 81 mg by mouth daily.     CHOLECALCIFEROL PO Take 2,000 Units by mouth daily. Takes 2-3 days per week currently     CINNAMON PO Take 500 mg by mouth in the morning and at bedtime.      clotrimazole-betamethasone (LOTRISONE) cream Apply to rash 2 to 3 x /day (Patient not taking: Reported on 07/09/2021) 45 g 3   cyanocobalamin (,VITAMIN B-12,) 1000 MCG/ML injection INJECT 1/2 ML EVERY MONTH 10 mL 2   empagliflozin (JARDIANCE) 25 MG TABS tablet Take      1 tablet     Daily         for Diabetes 90 tablet 1   famotidine (PEPCID) 20 MG tablet TAKE 1 TABLET BY MOUTH TWICE A DAY  WITH MEALS FOR ACIDE INDIGESTION 180 tablet 3   glipiZIDE (GLUCOTROL) 10 MG tablet TAKE 1 TABLET BY MOUTH 3 TIMES A DAY BEFORE MEALS FOR DIABETES 270 tablet 3   glucose blood (ONE TOUCH ULTRA TEST) test strip CHECK BLOOD SUGAR ONCE DAILY 100 each 1   metFORMIN (GLUCOPHAGE-XR) 500 MG 24 hr tablet TAKE 2 TABLETS TWICE A DAY WITH MEALS FOR DIABETES 360 tablet 3   metoprolol succinate (TOPROL-XL) 50 MG 24 hr tablet Take 1.5 tablets (75 mg total) by mouth daily. TAKE WITH OR IMMEDIATELY FOLLOWING A MEAL. 135 tablet 3   rosuvastatin (CRESTOR) 10 MG tablet Take 1 tablet (10 mg total) by mouth daily. 90 tablet 3   warfarin (COUMADIN) 5 MG tablet TAKE 1/2 TO 1 TABLET BY MOUTH DAILY OR AS DIRECTED 90 tablet 0   No current facility-administered medications on file prior to visit.    Allergies: No Known Allergies  Current Problems (verified) has Long term current use of anticoagulant therapy; H/O mechanical aortic valve replacement; Hyperlipidemia associated with type 2 diabetes mellitus  ([place]); Type II diabetes mellitus with nephropathy ([place]); Vitamin D deficiency; Vitamin B12 deficiency; Essential hypertension; Medication management; Coronary artery disease due to lipid rich plaque; Hx of CABG; Encounter for Medicare annual wellness exam; Obesity (BMI 30.0-34.9); CKD stage 2 due to type 2 diabetes mellitus ([place]); Atrial fibrillation ([place]); GERD (gastroesophageal reflux disease); Ascending aortic aneurysm; and 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  Screening Tests Immunization History  Administered Date(s) Administered   Fluad Quad(high Dose 65+) 10/14/2020   Influenza Whole 09/01/2012   Influenza, High Dose Seasonal PF 09/27/2015, 10/14/2020   Influenza-Unspecified 08/25/2016, 08/24/2018, 09/13/2019   PFIZER Comirnaty(Gray Top)Covid-19 Tri-Sucrose Vaccine 12/18/2020   PFIZER(Purple Top)SARS-COV-2 Vaccination 03/18/2020, 04/13/2020   Pneumococcal Conjugate-13 12/18/2015   Pneumococcal Polysaccharide-23 09/01/2012   Tdap 11/25/2007   Preventative care: Last colonoscopy: 2011  Cologuard: 11/2018 negative   Prior vaccinations: TD or Tdap: 2009 DUE patient declines Influenza: 09/2020  Pneumococcal: 2013 Prevnar13: 2017 Shingles/Zostavax: declines SARS-COV2: [place], complete 04/13/20  Names of Other Physician/Practitioners you currently use: 1. [facility] here for primary care 2. Dr. Delman Cheadle, eye doctor, last visit 11/15/2019 3. Dr. Delphia Grates, dentist, last visit 2021, goes q59m  Patient Care Team: Unk Pinto, MD as PCP - General (Internal Medicine) Debara Pickett Nadean Corwin, MD as PCP - Cardiology (Cardiology) Debara Pickett Nadean Corwin, MD as Consulting Physician (Cardiology) Laurence Spates, MD (Inactive) as Consulting Physician (Gastroenterology)  Surgical: He  has a past surgical history that includes Aortic valve replacement (07/18/2003); Hernia repair (09/11/2011); Aortic valve replacement (avr)/coronary  artery bypass grafting (cabg) (07/18/2003); Cardiac catheterization (07/13/2003); TEE without cardioversion (07/18/2003); transthoracic echocardiogram (02/21/2013); and NM MYOCAR PERF WALL MOTION (02/12/2010). Family His family history includes Cancer in his brother and sister; Heart attack in his father; Heart disease in his father; Stroke in his mother. Social history  He reports that he has never smoked. He has never used smokeless tobacco. He reports that he does not drink alcohol and does not use drugs.    Review of Systems  Constitutional:  Negative for chills, fever and weight loss.  HENT:  Negative for congestion and hearing loss.   Eyes:  Negative for blurred vision and double vision.  Respiratory:  Negative for cough and shortness of breath.   Cardiovascular:  Negative for chest pain, palpitations, orthopnea and leg swelling.  Gastrointestinal:  Negative for abdominal pain, constipation, diarrhea, heartburn, nausea and vomiting.  Musculoskeletal:  Negative for falls, joint pain and myalgias.  Skin:  Negative for rash.  Neurological:  Negative for dizziness, tingling, tremors, loss of consciousness and headaches.  Psychiatric/Behavioral:  Negative for depression, memory loss and suicidal ideas.     Objective:   There were no vitals filed for this visit.  There is no height or weight on file to calculate BMI.  General appearance: alert, no distress, WD/WN, male HEENT: normocephalic, sclerae anicteric, TMs pearly, nares patent, no discharge or erythema, pharynx normal Oral cavity: MMM, no lesions Neck: supple, no lymphadenopathy, no thyromegaly, no masses Heart: RRR, normal S1, S2, no murmurs Lungs: CTA bilaterally, no wheezes, rhonchi, or rales Abdomen: +bs, soft, non tender, non distended, no masses, no hepatomegaly, no splenomegaly Musculoskeletal: nontender, no swelling, no obvious deformity Extremities: no edema, no cyanosis, no clubbing Pulses: 2+ symmetric, upper and  lower extremities, normal cap refill Neurological: alert, oriented x 3, CN2-12 intact, strength normal upper extremities and lower extremities, sensation normal throughout, DTRs 2+ throughout, no cerebellar signs, gait normal Psychiatric: normal affect, behavior normal, pleasant     [facility]  10/21/2021

## 2021-10-22 ENCOUNTER — Ambulatory Visit: Payer: PPO | Admitting: Nurse Practitioner

## 2021-10-24 ENCOUNTER — Other Ambulatory Visit: Payer: Self-pay

## 2021-10-24 ENCOUNTER — Ambulatory Visit (INDEPENDENT_AMBULATORY_CARE_PROVIDER_SITE_OTHER): Payer: PPO

## 2021-10-24 ENCOUNTER — Encounter: Payer: Self-pay | Admitting: Internal Medicine

## 2021-10-24 ENCOUNTER — Ambulatory Visit (INDEPENDENT_AMBULATORY_CARE_PROVIDER_SITE_OTHER): Payer: PPO | Admitting: Internal Medicine

## 2021-10-24 VITALS — BP 136/72 | HR 73 | Ht 69.0 in | Wt 203.6 lb

## 2021-10-24 DIAGNOSIS — I482 Chronic atrial fibrillation, unspecified: Secondary | ICD-10-CM

## 2021-10-24 DIAGNOSIS — I712 Thoracic aortic aneurysm, without rupture, unspecified: Secondary | ICD-10-CM | POA: Diagnosis not present

## 2021-10-24 DIAGNOSIS — Z7901 Long term (current) use of anticoagulants: Secondary | ICD-10-CM

## 2021-10-24 DIAGNOSIS — Z952 Presence of prosthetic heart valve: Secondary | ICD-10-CM

## 2021-10-24 DIAGNOSIS — I25810 Atherosclerosis of coronary artery bypass graft(s) without angina pectoris: Secondary | ICD-10-CM | POA: Diagnosis not present

## 2021-10-24 LAB — POCT INR: INR: 1.9 — AB (ref 2.0–3.0)

## 2021-10-24 NOTE — Progress Notes (Signed)
OFFICE NOTE  Chief Complaint:  No complaints  Primary Care Physician: Unk Pinto, MD  HPI:  Billy Reilly is a 80 year old gentleman, previously followed by Dr. Rex Kras, with a history of coronary disease. In 2004, he had CABG and replacement of his aortic valve with a mechanical prosthesis. He had 2-vessel bypass at the time with saphenous vein graft to the first diagonal and saphenous vein graft to the posterior descending by Dr. Prescott Gum, and replacement of the aortic valve with a 22 mm ATS AP valve. He has done well with valve replacement to this point without any recurrent angina, shortness of breath, palpitations, presyncope, or syncopal symptoms. INR checked today in the office is 2.6 and it has been stable on Coumadin without dose adjustments for at least a year. 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 Recently he was started on Farxiga, which is resulted in excellent weight loss as well as control of his blood sugars.  Overall he feels quite well.  I saw Billy Reilly back today in the office. He is here for DOT visit. When I last saw him he was doing well denies any chest pain or shortness of breath. That holds true today. His warfarin levels have been well controlled his INR was 3.1 today. He has not had stress testing in over 5 years and his bypass was in 2004. 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  Billy Reilly returns today for follow-up. I last saw him 6 months ago for DOT physical. At that time we performed a nuclear stress test which was negative for ischemia. 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 He denies any chest pain or worsening shortness of breath. Blood pressures been well controlled.  I saw Billy Reilly back today in the office for follow-up. He returns for a DOT letter. He continues to do well after bypass surgery  mechanical valve replacement. His INRs have been therapeutic. He is completely asymptomatic. 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 He had a stress test last year which was negative for ischemia. He is due for repeat echocardiogram to reassess his valve gradients this year.  01/20/2017  Billy Reilly was seen today in follow-up. He is due for renewal of his DOT physical. From a cardiac standpoint he seems stable. Denies a chest pain or worsening shortness of breath. An echocardiogram last year which showed a stable aortic valve gradient. 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 He is on warfarin and INR today was 2.8. EKG shows sinus rhythm.  12/23/2017  Billy Reilly was seen today for routine follow-up.  He continues to work and requires a renewal of his DOT letter.  He has an upcoming echo at the end of February to assess the stability of his valve.  He denies any chest pain worsening shortness of breath.  His INR was a slightly low he needs some adjustment.  EKG is unchanged.  01/10/2019  Billy Reilly returns today for follow-up.  Overall he continues to do well.  He still working for [employer].  He drives oil trucks.  He continues to get annual DOT physicals.  He has an upcoming physical.  His echo last year showed a stable LVEF with normally functioning mechanical aortic valve.  His INR was  therapeutic today on warfarin.  He denies any worsening shortness of breath.  He has had no further chest pain.  He is now more than 15 years out from his bypass surgery.  I did review lab work from December 2019 which showed total cholesterol 146, HDL 40, LDL 89 and triglycerides 81.  His hemoglobin A1c was 8.4.  We discussed diet as well does sound like he has somewhat of increased saturated fats in his diet which could be further optimized.  12/05/2019  Billy Reilly is seen today in follow-up.  Overall he remains asymptomatic.  Denies any new or  worsening chest pain or shortness of breath.  Recently saw his primary care provider and needs clearance for DOT physical.  Today he was noted incidentally to have atrial flutter with borderline ventricular response on his EKG.  Review of prior EKGs did not demonstrate this.  Fortunately he is anticoagulated on warfarin for his mechanical aortic valve.  He has had therapeutic INRs with this and is due for an INR today.  02/24/2020  Billy Reilly is seen today in follow-up. When I last saw him he was in newly recognized atrial flutter. He stated he was asymptomatic. I repeated his echo which showed normal LVEF 65 to 70% however there is severe left atrial enlargement and moderate right atrial enlargement. His mechanical aortic valve seems to be working appropriately. 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 He reports he continues to be asymptomatic. Today he is noted to be in atrial fibrillation.  10/24/2021  Billy Reilly returns today for follow-up.  He is without complaints.  He denies any chest pain or worsening shortness of breath.  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  He is also been followed by his primary care provider closely.  His A1c had increased recently up to 8.4% but he was taking only apparently half of the 25 mg Jardiance tablet to save money.  Total cholesterol was 130 this summer, HDL 43, triglycerides 88 and LDL 70.  PMHx:  Past Medical History:  Diagnosis Date   Anemia    AS (aortic stenosis)    valve replacement-st. jude mechanical valve   ASHD (arteriosclerotic heart disease)    CAD (coronary artery disease), native coronary artery    CABG 07/18/2003   Cancer (HCC)    prostate   GERD (gastroesophageal reflux disease)    History of nuclear stress test 02/12/10   EF 67%, normal perfusion   Hyperlipidemia    Hypertension    Morbid obesity ([place])    Type II or unspecified type  diabetes mellitus without mention of complication, not stated as uncontrolled    Umbilical hernia    Vitamin B12 deficiency    Vitamin D deficiency     Past Surgical History:  Procedure Laterality Date   AORTIC VALVE REPLACEMENT  07/18/2003   14mm ATS-AP valve   AORTIC VALVE REPLACEMENT (AVR)/CORONARY ARTERY BYPASS GRAFTING (CABG)  07/18/2003   AVR; bypass x 2-SVG to first diag, SVG to posterior descending   CARDIAC CATHETERIZATION  07/13/2003   AS-aortic valve gradient 42mmhg, valve area 1.1; obstruction of ostium of posterior lateral branch with obstruction in the ostium of first diagnonal   HERNIA REPAIR  63/78/5885   RIH, umbilical   NM MYOCAR PERF WALL MOTION  02/12/2010   protocol:Bruce, EF 67%, exercise cap 7 METS, low risk  scan   TEE WITHOUT CARDIOVERSION  07/18/2003   no evidence of PE, Aortic valve heavily calcified,    TRANSTHORACIC ECHOCARDIOGRAM  02/21/2013   EF60-65%, Septal motion showed paradox. atrium moderately dilated    FAMHx:  Family History  Problem Relation Age of Onset   Stroke Mother    Heart attack Father    Heart disease Father    Cancer Brother    Cancer Sister     SOCHx:   reports that he has never smoked. He has never used smokeless tobacco. He reports that he does not drink alcohol and does not use drugs.  ALLERGIES:  No Known Allergies  ROS: Pertinent items noted in HPI and remainder of comprehensive ROS otherwise negative.  HOME MEDS: Current Outpatient Medications  Medication Sig Dispense Refill   aspirin 81 MG tablet Take 81 mg by mouth daily.     CHOLECALCIFEROL PO Take 2,000 Units by mouth daily. Takes 2-3 days per week currently     CINNAMON PO Take 500 mg by mouth in the morning and at bedtime.      cyanocobalamin (,VITAMIN B-12,) 1000 MCG/ML injection INJECT 1/2 ML EVERY MONTH 10 mL 2   empagliflozin (JARDIANCE) 25 MG TABS tablet Take      1 tablet     Daily         for Diabetes 90 tablet 1   enalapril (VASOTEC) 20 MG tablet  Take 2 tablets (40 mg total) by mouth daily. 180 tablet 1   famotidine (PEPCID) 20 MG tablet TAKE 1 TABLET BY MOUTH TWICE A DAY WITH MEALS FOR ACIDE INDIGESTION 180 tablet 3   glipiZIDE (GLUCOTROL) 10 MG tablet TAKE 1 TABLET BY MOUTH 3 TIMES A DAY BEFORE MEALS FOR DIABETES 270 tablet 3   glucose blood (ONE TOUCH ULTRA TEST) test strip CHECK BLOOD SUGAR ONCE DAILY 100 each 1   metFORMIN (GLUCOPHAGE-XR) 500 MG 24 hr tablet TAKE 2 TABLETS TWICE A DAY WITH MEALS FOR DIABETES 360 tablet 3   metoprolol succinate (TOPROL-XL) 50 MG 24 hr tablet Take 1.5 tablets (75 mg total) by mouth daily. TAKE WITH OR IMMEDIATELY FOLLOWING A MEAL. 135 tablet 3   rosuvastatin (CRESTOR) 10 MG tablet Take 1 tablet (10 mg total) by mouth daily. 90 tablet 3   warfarin (COUMADIN) 5 MG tablet TAKE 1/2 TO 1 TABLET BY MOUTH DAILY OR AS DIRECTED 90 tablet 0   No current facility-administered medications for this visit.    LABS/IMAGING: No results found for this or any previous visit (from the past 48 hour(s)). No results found.  VITALS: BP 136/72   Pulse 73   Ht 5\' 9"  (1.753 m)   Wt 203 lb 9.6 oz (92.4 kg)   SpO2 97%   BMI 30.07 kg/m   EXAM: Deferred  EKG: A. 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   Mr. Mcnellis seems to be doing well and has had persistent if not longstanding persistent A. fib at this point.  This may be permanent because there is no likely plan to get him back in rhythm.  He is asymptomatic with it and anticoagulated for his mechanical aortic valve.  He denies any chest pain.  Blood pressure is well controlled.  Cholesterol is at target.  His diabetes is uncontrolled however he recently had an increase in his  [place].  His aortic aneurysm measured 4.1 cm and is felt to be relatively stable.  Follow-up with me annually or sooner as necessary.  [name], MD, [facility], [place] Director of the [facility] Lipid Disorders &  Cardiovascular Risk Reduction Clinic Diplomate of the American Board of Clinical Lipidology Attending Cardiologist  Direct Dial: [phone]  Fax: [phone]  Website:  [email or web address] Douds 10/24/2021, 11:14 AM

## 2021-10-24 NOTE — Patient Instructions (Signed)
Take 1 tablet tonight only and then continue the same dose of warfarin 1/2 a tablet daily except for 1 tablet on Mondays and Fridays. Recheck INR in 2 weeks. Coumadin Clinic [phone]

## 2021-10-24 NOTE — Patient Instructions (Signed)

## 2021-10-25 NOTE — Progress Notes (Signed)
3 MONTH FOLLOW UP Assessment:    Diagnoses and all orders for this visit:  Atrial fib/flutter ([place]) Chronic, on coumadin for valve, following with cardiology  Rate controlled today, monitoring only for now, wasn't reocmmended CV  Ascending aortic aneurysm ([place]) Incidentally noted on ECHO, cardiology is monitoring with annual CTA Last CTA 06/2020 Control BP   Atherosclerosis of aorta ([place]) Per CT 06/2020 Control blood pressure, cholesterol, glucose, increase exercise.   Coronary artery disease due to lipid rich plaque Control blood pressure, cholesterol, glucose, increase exercise.  Followed by cardiology -     Lipid panel  Essential hypertension Continue medication Monitor blood pressure at home; call if consistently over 140/80 Continue DASH diet.   Reminder to go to the ER if any CP, SOB, nausea, dizziness, severe HA, changes vision/speech, left arm numbness and tingling and jaw pain.  H/O mechanical aortic valve replacement Continue coumadin; followed by cardiology  Hx of CABG Control blood pressure, cholesterol, glucose, increase exercise.  Followed by cardiolgy  Acquired thrombophilia (HCC)/ Long term current use of anticoagulant therapy Managed by cardiology Discussed if patient falls to immediately contact office or go to ER. Discussed foods that can increase or decrease Coumadin levels.  Patient understands to call the office before starting a new medication.  Type II diabetes mellitus with nephropathy [facility]) Education: Reviewed 'ABCs' of diabetes management (respective goals in parentheses):  A1C (<8), blood pressure (<130/80), and cholesterol (LDL <70) Eye Exam yearly and Dental Exam every 6 months  Dietary recommendations Physical Activity recommendations UTD foot exam  He reports back to taking full dose jardiance, currently only taking glipizide 10 mg once daily - can increase if needed Reviewed risks/benefits; needs to check glucose regularly with  glipizide; strips refilled; increase glipizide dose pending A1C results if needed Consider switch to ozempic/truliticy if any hypoglycemia or persistent elevations; discussed additional weight benefits; he wants to hold off for now -     Hemoglobin A1c  Mixed hyperlipidemia associated with T2DM (HCC) Currently above goal of LDL <70 Plan switch to rosuvastatin 10 mg daily if remains above goal today Continue low cholesterol diet and exercise.  Check lipid panel.  -     Lipid panel -     TSH  CKD II associated with T2DM (HCC) Increase fluids, avoid NSAIDS, monitor sugars, will monitor  Obesity (BMI 30.0-34.9) Long discussion about weight loss, diet, and exercise Recommended diet heavy in fruits and veggies and low in animal meats, cheeses, and dairy products, appropriate calorie intake Discussed appropriate weight for height and initial goal (200 lb) Follow up at next visit  Vitamin B12 deficiency Continue supplement; check levels at CPE  Vitamin D deficiency Patient admits taking irregularly, encouraged daily 5000 IU, take with all other meds Check vitamin D next visit   Medication management -     CBC with Differential/Platelet -     CMPGFR   Over 30 minutes of exam, counseling, chart review, and critical decision making was performed  Future Appointments  Date Time Provider [place]  03/13/2022  2:00 PM Unk Pinto, MD [facility] None  04/02/2022 11:00 AM Liane Comber, NP [facility] None  06/24/2022  1:00 PM [facility] 1 [facility]    Subjective:  Billy Reilly is a 80 y.o. male with hx of ASCAD/CABG/AoVR who presents for 3 month follow up for HTN, hyperlipidemia, T2DM, CKD and vitamin D Def.   Wife's health declined, advanced kidney disease and opted not to pursue hospice, past 09/13/2021. No children.  His brother in law checks in on him. He reports mood is good, sleeping.   GERD is well controlled by famotidine 20 mg BID.   BMI is Body mass  index is 30.27 kg/m., he has been working on diet and exercise. Weight goal < 200 lb, he has been reducing breakfast meat, eggs, doing oatmeal instead, doing protein + veggies for meal whenever possible, limited evening intake.  He drinks diet caffeine free mountain dew - <1 bottle daily.  He drinks unsweet tea.  Wt Readings from Last 3 Encounters:  10/29/21 205 lb (93 kg)  10/24/21 203 lb 9.6 oz (92.4 kg)  07/09/21 206 lb 12.8 oz (93.8 kg)   He has hx of CABG & Mech AoVR in 2004; he is on coumadin and followed by coumadin clinic. Nuclear Stress test was negative in 2016 by Dr Debara Pickett. In early 2021 he was found to be in a. Fib/flutter at cardiology, asymptomatic. Had follow up ECHO 12/27/2019 showed severe/mod atrial enlargement with normal EF. Also incidentally noted ascending aortic aneurysm 4.4 cm, had 6 month CTA 06/2020 that showed 4.0 cm dilation and recommended annual follow up, had in 07/02/2021 at 41 mm.   His blood pressure has been controlled at home (120s/70s), today their BP is BP: 128/70 He does not workout. He denies chest pain, shortness of breath, dizziness.   He is on cholesterol medication (rosuvastatin 10 mg daily) and denies myalgias. His cholesterol is not at goal for LDL <70. The cholesterol last visit was:   Lab Results  Component Value Date   CHOL 130 07/09/2021   HDL 43 07/09/2021   LDLCALC 70 07/09/2021   TRIG 88 07/09/2021   CHOLHDL 3.0 07/09/2021   He has not been working on diet and exercise for T2DM (metformin 2000 mg daily, glipizide 10 mg - taking 1 tab daily with breakfast, jardiance 25 mg daily - he increased from 1/2 tab to whole after last visit, cinnamon), and denies foot ulcerations, hypoglycemia , increased appetite, nausea, paresthesia of the feet, polydipsia, polyuria, visual disturbances, vomiting and weight loss.  He checks a fasting glucose a few days a week, admits not in 6 months, s been out of glucose strips   Has had hypoglycemia in the past,  typically "has sweats" but none recently.  Last A1C in the office was:  Lab Results  Component Value Date   HGBA1C 8.4 (H) 07/09/2021   He has CKD I/II associated with T2DM monitored at this office:  Lab Results  Component Value Date   GFRNONAA 91 04/02/2021    Patient is not on Vitamin D supplement and very low at last check, admits has been forgetting to take, but has been taking 5000 IU 2-3 times a week:    Lab Results  Component Value Date   VD25OH 25 (L) 07/09/2021       Medication Review: Current Outpatient Medications on File Prior to Visit  Medication Sig Dispense Refill   aspirin 81 MG tablet Take 81 mg by mouth daily.     CHOLECALCIFEROL PO Take 2,000 Units by mouth daily. Takes 2-3 days per week currently     CINNAMON PO Take 500 mg by mouth in the morning and at bedtime.      cyanocobalamin (,VITAMIN B-12,) 1000 MCG/ML injection INJECT 1/2 ML EVERY MONTH 10 mL 2   empagliflozin (JARDIANCE) 25 MG TABS tablet Take      1 tablet     Daily  for Diabetes 90 tablet 1   enalapril (VASOTEC) 20 MG tablet Take 2 tablets (40 mg total) by mouth daily. 180 tablet 1   famotidine (PEPCID) 20 MG tablet TAKE 1 TABLET BY MOUTH TWICE A DAY WITH MEALS FOR ACIDE INDIGESTION 180 tablet 3   glipiZIDE (GLUCOTROL) 10 MG tablet TAKE 1 TABLET BY MOUTH 3 TIMES A DAY BEFORE MEALS FOR DIABETES 270 tablet 3   glucose blood (ONE TOUCH ULTRA TEST) test strip CHECK BLOOD SUGAR ONCE DAILY 100 each 1   metFORMIN (GLUCOPHAGE-XR) 500 MG 24 hr tablet TAKE 2 TABLETS TWICE A DAY WITH MEALS FOR DIABETES 360 tablet 3   metoprolol succinate (TOPROL-XL) 50 MG 24 hr tablet Take 1.5 tablets (75 mg total) by mouth daily. TAKE WITH OR IMMEDIATELY FOLLOWING A MEAL. 135 tablet 3   rosuvastatin (CRESTOR) 10 MG tablet Take 1 tablet (10 mg total) by mouth daily. 90 tablet 3   warfarin (COUMADIN) 5 MG tablet TAKE 1/2 TO 1 TABLET BY MOUTH DAILY OR AS DIRECTED 90 tablet 0   No current facility-administered medications  on file prior to visit.    Allergies: No Known Allergies  Current Problems (verified) has Long term current use of anticoagulant therapy; H/O mechanical aortic valve replacement; Hyperlipidemia associated with type 2 diabetes mellitus ([place]); Type II diabetes mellitus with nephropathy ([place]); Vitamin D deficiency; Vitamin B12 deficiency; Essential hypertension; Medication management; Coronary artery disease due to lipid rich plaque; Hx of CABG; Obesity (BMI 30.0-34.9); CKD stage 2 due to type 2 diabetes mellitus ([place]); Atrial fibrillation ([place]); GERD (gastroesophageal reflux disease); Ascending aortic aneurysm; and Aortic atherosclerosis ([place]) by CTA 06/2020 on their problem list.  Surgical: He  has a past surgical history that includes Aortic valve replacement (07/18/2003); Hernia repair (09/11/2011); Aortic valve replacement (avr)/coronary artery bypass grafting (cabg) (07/18/2003); Cardiac catheterization (07/13/2003); TEE without cardioversion (07/18/2003); transthoracic echocardiogram (02/21/2013); and NM MYOCAR PERF WALL MOTION (02/12/2010). Family His family history includes Cancer in his brother and sister; Heart attack in his father; Heart disease in his father; Stroke in his mother. Social history  He reports that he has never smoked. He has never used smokeless tobacco. He reports that he does not drink alcohol and does not use drugs.  Review of Systems  Constitutional:  Negative for malaise/fatigue and weight loss.  HENT:  Negative for hearing loss and tinnitus.   Eyes:  Negative for blurred vision and double vision.  Respiratory:  Negative for cough, shortness of breath and wheezing.   Cardiovascular:  Negative for chest pain, palpitations, orthopnea, claudication and leg swelling.  Gastrointestinal:  Negative for abdominal pain, blood in stool, constipation, diarrhea, heartburn, melena, nausea and vomiting.  Genitourinary: Negative.   Musculoskeletal:  Negative for joint pain and  myalgias.  Skin:  Negative for rash.  Neurological:  Negative for dizziness, tingling, sensory change, weakness and headaches.  Endo/Heme/Allergies:  Negative for polydipsia.  Psychiatric/Behavioral: Negative.    All other systems reviewed and are negative.   Objective:   Today's Vitals   10/29/21 1051 10/29/21 1128  BP: (!) 148/88 128/70  Pulse: 84   Temp: 97.7 F (36.5 C)   Weight: 205 lb (93 kg)   PF: 97 L/min     Body mass index is 30.27 kg/m.  General appearance: alert, no distress, WD/WN, male HEENT: normocephalic, sclerae anicteric, TMs pearly, nares patent, no discharge or erythema, pharynx normal Oral cavity: MMM, no lesions Neck: supple, no lymphadenopathy, no thyromegaly, no masses Heart: Irregularly irregular, normal S1, S2,  no murmurs Lungs: CTA bilaterally, no wheezes, rhonchi, or rales Abdomen: +bs, soft, non tender, non distended, no masses, no hepatomegaly, no splenomegaly Musculoskeletal: nontender, no swelling, no obvious deformity Extremities: no edema, no cyanosis, no clubbing Pulses: 2+ symmetric, upper and lower extremities, normal cap refill Neurological: alert, oriented x 3, CN2-12 intact, strength normal upper extremities and lower extremities, sensation normal throughout, DTRs 2+ throughout, no cerebellar signs, gait normal Psychiatric: normal affect, behavior normal, pleasant     Izora Ribas, NP   10/29/2021

## 2021-10-29 ENCOUNTER — Ambulatory Visit (INDEPENDENT_AMBULATORY_CARE_PROVIDER_SITE_OTHER): Payer: PPO | Admitting: Adult Health

## 2021-10-29 ENCOUNTER — Encounter: Payer: Self-pay | Admitting: Adult Health

## 2021-10-29 ENCOUNTER — Other Ambulatory Visit: Payer: Self-pay

## 2021-10-29 VITALS — BP 128/70 | HR 84 | Temp 97.7°F | Wt 205.0 lb

## 2021-10-29 DIAGNOSIS — E559 Vitamin D deficiency, unspecified: Secondary | ICD-10-CM

## 2021-10-29 DIAGNOSIS — E1121 Type 2 diabetes mellitus with diabetic nephropathy: Secondary | ICD-10-CM | POA: Diagnosis not present

## 2021-10-29 DIAGNOSIS — Z79899 Other long term (current) drug therapy: Secondary | ICD-10-CM | POA: Diagnosis not present

## 2021-10-29 DIAGNOSIS — I7121 Aneurysm of the ascending aorta, without rupture: Secondary | ICD-10-CM

## 2021-10-29 DIAGNOSIS — N182 Chronic kidney disease, stage 2 (mild): Secondary | ICD-10-CM | POA: Diagnosis not present

## 2021-10-29 DIAGNOSIS — I700 Atherosclerosis of aorta: Secondary | ICD-10-CM | POA: Diagnosis not present

## 2021-10-29 DIAGNOSIS — E785 Hyperlipidemia, unspecified: Secondary | ICD-10-CM

## 2021-10-29 DIAGNOSIS — I10 Essential (primary) hypertension: Secondary | ICD-10-CM | POA: Diagnosis not present

## 2021-10-29 DIAGNOSIS — E669 Obesity, unspecified: Secondary | ICD-10-CM | POA: Diagnosis not present

## 2021-10-29 DIAGNOSIS — E1122 Type 2 diabetes mellitus with diabetic chronic kidney disease: Secondary | ICD-10-CM

## 2021-10-29 DIAGNOSIS — E1169 Type 2 diabetes mellitus with other specified complication: Secondary | ICD-10-CM | POA: Diagnosis not present

## 2021-10-29 DIAGNOSIS — I482 Chronic atrial fibrillation, unspecified: Secondary | ICD-10-CM | POA: Diagnosis not present

## 2021-10-29 DIAGNOSIS — E66811 Obesity, class 1: Secondary | ICD-10-CM

## 2021-10-29 MED ORDER — GLUCOSE BLOOD VI STRP: ORAL_STRIP | 3 refills | Status: DC

## 2021-10-30 LAB — HEMOGLOBIN A1C
Hgb A1c MFr Bld: 9.1 %{Hb} — ABNORMAL HIGH
Mean Plasma Glucose: 214 mg/dL
eAG (mmol/L): 11.9 mmol/L

## 2021-10-30 LAB — COMPLETE METABOLIC PANEL WITHOUT GFR
AG Ratio: 1.8 (calc) (ref 1.0–2.5)
ALT: 23 U/L (ref 9–46)
AST: 21 U/L (ref 10–35)
Albumin: 4.1 g/dL (ref 3.6–5.1)
Alkaline phosphatase (APISO): 89 U/L (ref 35–144)
BUN: 12 mg/dL (ref 7–25)
CO2: 24 mmol/L (ref 20–32)
Calcium: 8.8 mg/dL (ref 8.6–10.3)
Chloride: 107 mmol/L (ref 98–110)
Creat: 0.74 mg/dL (ref 0.70–1.28)
Globulin: 2.3 g/dL (ref 1.9–3.7)
Glucose, Bld: 162 mg/dL — ABNORMAL HIGH (ref 65–99)
Potassium: 4.4 mmol/L (ref 3.5–5.3)
Sodium: 141 mmol/L (ref 135–146)
Total Bilirubin: 1.1 mg/dL (ref 0.2–1.2)
Total Protein: 6.4 g/dL (ref 6.1–8.1)
eGFR: 92 mL/min/{1.73_m2}

## 2021-10-30 LAB — LIPID PANEL
Cholesterol: 127 mg/dL
HDL: 42 mg/dL
LDL Cholesterol (Calc): 69 mg/dL
Non-HDL Cholesterol (Calc): 85 mg/dL
Total CHOL/HDL Ratio: 3 (calc)
Triglycerides: 77 mg/dL

## 2021-10-30 LAB — CBC WITH DIFFERENTIAL/PLATELET
Absolute Monocytes: 615 {cells}/uL (ref 200–950)
Basophils Absolute: 41 {cells}/uL (ref 0–200)
Basophils Relative: 0.7 %
Eosinophils Absolute: 93 {cells}/uL (ref 15–500)
Eosinophils Relative: 1.6 %
HCT: 46 % (ref 38.5–50.0)
Hemoglobin: 16.1 g/dL (ref 13.2–17.1)
Lymphs Abs: 557 {cells}/uL — ABNORMAL LOW (ref 850–3900)
MCH: 33.8 pg — ABNORMAL HIGH (ref 27.0–33.0)
MCHC: 35 g/dL (ref 32.0–36.0)
MCV: 96.4 fL (ref 80.0–100.0)
MPV: 12.1 fL (ref 7.5–12.5)
Monocytes Relative: 10.6 %
Neutro Abs: 4495 {cells}/uL (ref 1500–7800)
Neutrophils Relative %: 77.5 %
Platelets: 105 10*3/uL — ABNORMAL LOW (ref 140–400)
RBC: 4.77 Million/uL (ref 4.20–5.80)
RDW: 12 % (ref 11.0–15.0)
Total Lymphocyte: 9.6 %
WBC: 5.8 10*3/uL (ref 3.8–10.8)

## 2021-10-30 LAB — MAGNESIUM: Magnesium: 2 mg/dL (ref 1.5–2.5)

## 2021-10-30 LAB — TSH: TSH: 2.04 m[IU]/L (ref 0.40–4.50)

## 2021-11-07 ENCOUNTER — Ambulatory Visit (INDEPENDENT_AMBULATORY_CARE_PROVIDER_SITE_OTHER): Payer: PPO | Admitting: Cardiology

## 2021-11-07 DIAGNOSIS — Z952 Presence of prosthetic heart valve: Secondary | ICD-10-CM

## 2021-11-07 DIAGNOSIS — Z7901 Long term (current) use of anticoagulants: Secondary | ICD-10-CM

## 2021-11-07 LAB — POCT INR: INR: 2.9 (ref 2.0–3.0)

## 2021-11-08 LAB — PROTIME-INR: INR: 2.9 — AB (ref 0.9–1.1)

## 2021-11-25 LAB — POCT INR: INR: 3 (ref 2.0–3.0)

## 2021-11-26 ENCOUNTER — Ambulatory Visit (INDEPENDENT_AMBULATORY_CARE_PROVIDER_SITE_OTHER): Payer: Medicare Other | Admitting: Pharmacist Clinician (PhC)/ Clinical Pharmacy Specialist

## 2021-11-26 DIAGNOSIS — Z952 Presence of prosthetic heart valve: Secondary | ICD-10-CM | POA: Diagnosis not present

## 2021-11-26 DIAGNOSIS — I482 Chronic atrial fibrillation, unspecified: Secondary | ICD-10-CM

## 2021-11-26 DIAGNOSIS — Z7901 Long term (current) use of anticoagulants: Secondary | ICD-10-CM

## 2021-12-05 ENCOUNTER — Other Ambulatory Visit: Payer: Self-pay | Admitting: Adult Health Nurse Practitioner

## 2021-12-05 DIAGNOSIS — B379 Candidiasis, unspecified: Secondary | ICD-10-CM

## 2021-12-06 ENCOUNTER — Other Ambulatory Visit: Payer: Self-pay | Admitting: Internal Medicine

## 2021-12-06 NOTE — Progress Notes (Signed)
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   Coronary artery disease due to lipid rich plaque Control blood pressure, cholesterol, glucose, increase exercise.  Followed by cardiology -     Lipid panel  Essential hypertension Continue medication Monitor blood pressure at home; call if consistently over 140/80 Continue DASH diet.   Reminder to go to the ER if any CP, SOB, nausea, dizziness, severe HA, changes vision/speech, left arm numbness and tingling and jaw pain.  H/O mechanical aortic valve replacement Continue coumadin; followed by cardiology  Hx of CABG Control blood pressure, cholesterol, glucose, increase exercise.  Followed by cardiolgy  Acquired thrombophilia (HCC)/ Long term current use of anticoagulant therapy Managed by cardiology Discussed if patient falls to immediately contact office or go to ER. Discussed foods that can increase or decrease Coumadin levels.  Patient understands to call the office before starting a new medication.  Type II diabetes mellitus with nephropathy ([place]) Education: Reviewed ABCs of diabetes management (respective goals in parentheses):  A1C (<8), blood pressure (<130/80), and cholesterol (LDL <70) Eye Exam yearly and Dental Exam every 6 months  Dietary recommendations Physical Activity recommendations UTD foot exam  He reports back to taking full dose jardiance, currently only taking glipizide 10 mg once daily - can increase if needed Reviewed risks/benefits; needs to check glucose regularly with  glipizide; strips refilled; increase glipizide dose pending A1C results if needed Consider switch to ozempic/truliticy if any hypoglycemia or persistent elevations; discussed additional weight benefits; he wants to hold off for now -     Hemoglobin A1c - understands <90 days, may get a bill, wants to proceed, new insurance  Mixed hyperlipidemia associated with T2DM ([place]) Currently at goal of LDL <70 Continue statin  Continue low cholesterol diet and exercise.  Check lipid panel.  -     Lipid panel - defer to next visit <90s from last -     TSH  CKD II associated with T2DM (HCC) Increase fluids, avoid NSAIDS, monitor sugars, will monitor  Obesity (BMI 30.0-34.9) Long discussion about weight loss, diet, and exercise Recommended diet heavy in fruits and veggies and low in animal meats, cheeses, and dairy products, appropriate calorie intake Discussed appropriate weight for height and initial goal (200 lb) Follow up at next visit  Vitamin B12 deficiency Continue supplement; check levels at CPE  Vitamin D deficiency Patient admits taking irregularly, encouraged daily 5000 IU, take with all other meds Check vitamin D next visit   Medication management -     CBC with Differential/Platelet -     CMPGFR   Over 30 minutes of exam, counseling, chart review, and critical decision making was performed  Future Appointments  Date Time Provider [place]  03/13/2022  2:00 PM Unk Pinto, MD [facility] None  06/19/2022 10:30 AM Liane Comber, NP [facility] None  06/24/2022  1:00 PM [facility] 1 [facility]    Subjective:  Billy Reilly is a 81 y.o. male with hx of ASCAD/CABG/AoVR who presents for 3 month follow up for HTN, hyperlipidemia, T2DM, CKD and vitamin D Def.   Wife's health declined, advanced  kidney disease and opted not to pursue hospice, past 09/13/2021. No children. His brother in law checks in on him. He reports mood is good, sleeping.   GERD is well  controlled by famotidine 20 mg BID.   BMI is Body mass index is 30.18 kg/m., he has been working on diet and exercise. Weight goal < 200 lb, he has been reducing breakfast meat, eggs, doing oatmeal instead, doing protein + veggies for meal whenever possible, limited evening intake.  He drinks diet caffeine free mountain dew - <1 bottle daily.  He drinks unsweet tea.  Wt Readings from Last 3 Encounters:  12/10/21 204 lb 6.4 oz (92.7 kg)  10/29/21 205 lb (93 kg)  10/24/21 203 lb 9.6 oz (92.4 kg)   He has hx of CABG & Mech AoVR in 2004; he is on coumadin and followed by coumadin clinic. Nuclear Stress test was negative in 2016 by Dr Debara Pickett. In early 2021 he was found to be in a. Fib/flutter at cardiology, asymptomatic. Had follow up ECHO 12/27/2019 showed severe/mod atrial enlargement with normal EF. Also incidentally noted ascending aortic aneurysm 4.4 cm, had 6 month CTA 06/2020 that showed 4.0 cm dilation and recommended annual follow up, had in 07/02/2021 at 41 mm.   His blood pressure has been controlled at home (120s/70s), today their BP is BP: 130/78 He does not workout. He denies chest pain, shortness of breath, dizziness.   He is on cholesterol medication (rosuvastatin 10 mg daily) and denies myalgias. His cholesterol is not at goal for LDL <70. The cholesterol last visit was:   Lab Results  Component Value Date   CHOL 127 10/29/2021   HDL 42 10/29/2021   LDLCALC 69 10/29/2021   TRIG 77 10/29/2021   CHOLHDL 3.0 10/29/2021   He has not been working on diet and exercise for T2DM (metformin 2000 mg daily, glipizide 10 mg - taking 1 tab daily with breakfast and taking added second tab after dinner, jardiance 25 mg daily - he increased from 1/2 tab to whole after last visit, does have frequent groin yeast rash manages with lotrisone and feels would like to continue current therapy,  cinnamon), and denies foot ulcerations, hypoglycemia , increased appetite, nausea, paresthesia of the feet,  polydipsia, polyuria, visual disturbances, vomiting and weight loss.  He checks a fasting glucose recently improved, running 110-140.    Has had hypoglycemia in the past, typically "has sweats" but none recently. Last A1C in the office was:  Lab Results  Component Value Date   HGBA1C 9.1 (H) 10/29/2021   He has CKD I/II associated with T2DM monitored at this office:  Lab Results  Component Value Date   EGFR 92 10/29/2021    Patient is not on Vitamin D supplement and very low at last check, admits has been forgetting to take,    Lab Results  Component Value Date   VD25OH 25 (L) 07/09/2021       Medication Review: Current Outpatient Medications on File Prior to Visit  Medication Sig Dispense Refill   aspirin 81 MG tablet Take 81 mg by mouth daily.     CINNAMON PO Take 500 mg by mouth in the morning and at bedtime.      cyanocobalamin (,VITAMIN B-12,) 1000 MCG/ML injection INJECT 1/2 ML EVERY MONTH 10 mL 2   empagliflozin (JARDIANCE) 25 MG TABS tablet Take      1 tablet     Daily         for Diabetes  90 tablet 1   enalapril (VASOTEC) 20 MG tablet Take 2 tablets (40 mg total) by mouth daily. 180 tablet 1   famotidine (PEPCID) 20 MG tablet TAKE 1 TABLET BY MOUTH TWICE A DAY WITH MEALS FOR ACIDE INDIGESTION 180 tablet 3   glipiZIDE (GLUCOTROL) 10 MG tablet TAKE 1 TABLET BY MOUTH 3 TIMES A DAY BEFORE MEALS FOR DIABETES 270 tablet 3   glucose blood (ONE TOUCH ULTRA TEST) test strip CHECK BLOOD SUGAR ONCE DAILY 100 each 3   metFORMIN (GLUCOPHAGE-XR) 500 MG 24 hr tablet TAKE 2 TABLETS TWICE A DAY WITH MEALS FOR DIABETES 360 tablet 3   metoprolol succinate (TOPROL-XL) 50 MG 24 hr tablet Take 1.5 tablets (75 mg total) by mouth daily. TAKE WITH OR IMMEDIATELY FOLLOWING A MEAL. 135 tablet 3   rosuvastatin (CRESTOR) 10 MG tablet Take 1 tablet (10 mg total) by mouth daily. 90 tablet 3   warfarin (COUMADIN) 5 MG tablet TAKE 1/2 TO 1 TABLET BY MOUTH DAILY OR AS DIRECTED 90 tablet 0   CHOLECALCIFEROL  PO Take 2,000 Units by mouth daily. Takes 2-3 days per week currently (Patient not taking: Reported on 12/10/2021)     No current facility-administered medications on file prior to visit.    Allergies: No Known Allergies  Current Problems (verified) has Long term current use of anticoagulant therapy; H/O mechanical aortic valve replacement; Hyperlipidemia associated with type 2 diabetes mellitus ([place]); Type II diabetes mellitus with nephropathy ([place]); Vitamin D deficiency; Vitamin B12 deficiency; Essential hypertension; Medication management; Coronary artery disease due to lipid rich plaque; Hx of CABG; Obesity (BMI 30.0-34.9); CKD stage 2 due to type 2 diabetes mellitus ([place]); Atrial fibrillation ([place]); GERD (gastroesophageal reflux disease); Ascending aortic aneurysm; and Aortic atherosclerosis ([place]) by CTA 06/2020 on their problem list.  Surgical: He  has a past surgical history that includes Aortic valve replacement (07/18/2003); Hernia repair (09/11/2011); Aortic valve replacement (avr)/coronary artery bypass grafting (cabg) (07/18/2003); Cardiac catheterization (07/13/2003); TEE without cardioversion (07/18/2003); transthoracic echocardiogram (02/21/2013); and NM MYOCAR PERF WALL MOTION (02/12/2010). Family His family history includes Cancer in his brother and sister; Heart attack in his father; Heart disease in his father; Stroke in his mother. Social history  He reports that he has never smoked. He has never used smokeless tobacco. He reports that he does not drink alcohol and does not use drugs.  Review of Systems  Constitutional:  Negative for malaise/fatigue and weight loss.  HENT:  Negative for hearing loss and tinnitus.   Eyes:  Negative for blurred vision and double vision.  Respiratory:  Negative for cough, shortness of breath and wheezing.   Cardiovascular:  Negative for chest pain, palpitations, orthopnea, claudication and leg swelling.  Gastrointestinal:  Negative for  abdominal pain, blood in stool, constipation, diarrhea, heartburn, melena, nausea and vomiting.  Genitourinary: Negative.   Musculoskeletal:  Negative for joint pain and myalgias.  Skin:  Negative for rash.  Neurological:  Negative for dizziness, tingling, sensory change, weakness and headaches.  Endo/Heme/Allergies:  Negative for polydipsia.  Psychiatric/Behavioral: Negative.    All other systems reviewed and are negative.   Objective:   Today's Vitals   12/10/21 1001  BP: 130/78  Pulse: 74  Resp: 18  Temp: 97.9 F (36.6 C)  SpO2: 96%  Weight: 204 lb 6.4 oz (92.7 kg)  Height: _0  (1.753 m)    Body mass index is 30.18 kg/m.  General appearance: alert, no distress, WD/WN, male HEENT: normocephalic, sclerae anicteric, TMs pearly, nares patent, no  discharge or erythema, pharynx normal Oral cavity: MMM, no lesions Neck: supple, no lymphadenopathy, no thyromegaly, no masses Heart: Irregularly irregular, normal S1, S2, no murmurs Lungs: CTA bilaterally, no wheezes, rhonchi, or rales Abdomen: +bs, soft, non tender, non distended, no masses, no hepatomegaly, no splenomegaly Musculoskeletal: nontender, no swelling, no obvious deformity Extremities: no edema, no cyanosis, no clubbing Pulses: 2+ symmetric, upper and lower extremities, normal cap refill Neurological: alert, oriented x 3, CN2-12 intact, strength normal upper extremities and lower extremities, sensation normal throughout, DTRs 2+ throughout, no cerebellar signs, gait normal Psychiatric: normal affect, behavior normal, pleasant     Izora Ribas, NP   12/10/2021

## 2021-12-09 DIAGNOSIS — Z5181 Encounter for therapeutic drug level monitoring: Secondary | ICD-10-CM | POA: Diagnosis not present

## 2021-12-09 DIAGNOSIS — Z7901 Long term (current) use of anticoagulants: Secondary | ICD-10-CM | POA: Diagnosis not present

## 2021-12-09 DIAGNOSIS — I480 Paroxysmal atrial fibrillation: Secondary | ICD-10-CM | POA: Diagnosis not present

## 2021-12-09 DIAGNOSIS — Z952 Presence of prosthetic heart valve: Secondary | ICD-10-CM | POA: Diagnosis not present

## 2021-12-09 LAB — POCT INR: INR: 2 (ref 2.0–3.0)

## 2021-12-10 ENCOUNTER — Encounter: Payer: Self-pay | Admitting: Adult Health

## 2021-12-10 ENCOUNTER — Other Ambulatory Visit: Payer: Self-pay

## 2021-12-10 ENCOUNTER — Ambulatory Visit (INDEPENDENT_AMBULATORY_CARE_PROVIDER_SITE_OTHER): Payer: Medicare Other | Admitting: Adult Health

## 2021-12-10 ENCOUNTER — Ambulatory Visit (INDEPENDENT_AMBULATORY_CARE_PROVIDER_SITE_OTHER): Payer: Medicare Other | Admitting: Pharmacist

## 2021-12-10 VITALS — BP 130/78 | HR 74 | Temp 97.9°F | Resp 18 | Ht 69.0 in | Wt 204.4 lb

## 2021-12-10 DIAGNOSIS — Z952 Presence of prosthetic heart valve: Secondary | ICD-10-CM

## 2021-12-10 DIAGNOSIS — Z7901 Long term (current) use of anticoagulants: Secondary | ICD-10-CM | POA: Diagnosis not present

## 2021-12-10 DIAGNOSIS — E1169 Type 2 diabetes mellitus with other specified complication: Secondary | ICD-10-CM

## 2021-12-10 DIAGNOSIS — I482 Chronic atrial fibrillation, unspecified: Secondary | ICD-10-CM

## 2021-12-10 DIAGNOSIS — E1121 Type 2 diabetes mellitus with diabetic nephropathy: Secondary | ICD-10-CM

## 2021-12-10 DIAGNOSIS — I700 Atherosclerosis of aorta: Secondary | ICD-10-CM | POA: Diagnosis not present

## 2021-12-10 DIAGNOSIS — N182 Chronic kidney disease, stage 2 (mild): Secondary | ICD-10-CM

## 2021-12-10 DIAGNOSIS — B379 Candidiasis, unspecified: Secondary | ICD-10-CM | POA: Diagnosis not present

## 2021-12-10 DIAGNOSIS — E1122 Type 2 diabetes mellitus with diabetic chronic kidney disease: Secondary | ICD-10-CM | POA: Diagnosis not present

## 2021-12-10 DIAGNOSIS — Z79899 Other long term (current) drug therapy: Secondary | ICD-10-CM | POA: Diagnosis not present

## 2021-12-10 DIAGNOSIS — E785 Hyperlipidemia, unspecified: Secondary | ICD-10-CM | POA: Diagnosis not present

## 2021-12-10 DIAGNOSIS — E669 Obesity, unspecified: Secondary | ICD-10-CM

## 2021-12-10 DIAGNOSIS — I10 Essential (primary) hypertension: Secondary | ICD-10-CM

## 2021-12-10 DIAGNOSIS — E66811 Obesity, class 1: Secondary | ICD-10-CM

## 2021-12-10 MED ORDER — CLOTRIMAZOLE-BETAMETHASONE 1-0.05 % EX CREA: TOPICAL_CREAM | CUTANEOUS | 3 refills | Status: DC

## 2021-12-10 MED ORDER — CHOLECALCIFEROL 125 MCG (5000 UT) PO CHEW: 5000.0000 [IU] | CHEWABLE_TABLET | Freq: Every day | ORAL | Status: DC

## 2021-12-10 NOTE — Patient Instructions (Signed)
Description   continue the same dose of warfarin 1/2 a tablet daily except for 1 tablet on Mondays and Fridays. Recheck INR in 2 weeks. [facility] [phone]

## 2021-12-10 NOTE — Patient Instructions (Signed)
° ° °  Please pick up 5000 IU vitamin D and start taking daily

## 2021-12-11 LAB — COMPLETE METABOLIC PANEL WITHOUT GFR
AG Ratio: 1.9 (calc) (ref 1.0–2.5)
ALT: 14 U/L (ref 9–46)
AST: 13 U/L (ref 10–35)
Albumin: 4.1 g/dL (ref 3.6–5.1)
Alkaline phosphatase (APISO): 74 U/L (ref 35–144)
BUN/Creatinine Ratio: 17 (calc) (ref 6–22)
BUN: 12 mg/dL (ref 7–25)
CO2: 28 mmol/L (ref 20–32)
Calcium: 9 mg/dL (ref 8.6–10.3)
Chloride: 105 mmol/L (ref 98–110)
Creat: 0.69 mg/dL — ABNORMAL LOW (ref 0.70–1.28)
Globulin: 2.2 g/dL (ref 1.9–3.7)
Glucose, Bld: 133 mg/dL — ABNORMAL HIGH (ref 65–99)
Potassium: 4.4 mmol/L (ref 3.5–5.3)
Sodium: 139 mmol/L (ref 135–146)
Total Bilirubin: 1 mg/dL (ref 0.2–1.2)
Total Protein: 6.3 g/dL (ref 6.1–8.1)
eGFR: 94 mL/min/{1.73_m2}

## 2021-12-11 LAB — CBC WITH DIFFERENTIAL/PLATELET
Absolute Monocytes: 482 {cells}/uL (ref 200–950)
Basophils Absolute: 53 {cells}/uL (ref 0–200)
Basophils Relative: 0.8 %
Eosinophils Absolute: 132 {cells}/uL (ref 15–500)
Eosinophils Relative: 2 %
HCT: 46.9 % (ref 38.5–50.0)
Hemoglobin: 15.9 g/dL (ref 13.2–17.1)
Lymphs Abs: 1241 {cells}/uL (ref 850–3900)
MCH: 33.9 pg — ABNORMAL HIGH (ref 27.0–33.0)
MCHC: 33.9 g/dL (ref 32.0–36.0)
MCV: 100 fL (ref 80.0–100.0)
MPV: 12 fL (ref 7.5–12.5)
Monocytes Relative: 7.3 %
Neutro Abs: 4693 {cells}/uL (ref 1500–7800)
Neutrophils Relative %: 71.1 %
Platelets: 123 10*3/uL — ABNORMAL LOW (ref 140–400)
RBC: 4.69 Million/uL (ref 4.20–5.80)
RDW: 13.1 % (ref 11.0–15.0)
Total Lymphocyte: 18.8 %
WBC: 6.6 10*3/uL (ref 3.8–10.8)

## 2021-12-11 LAB — HEMOGLOBIN A1C
Hgb A1c MFr Bld: 7.3 %{Hb} — ABNORMAL HIGH
Mean Plasma Glucose: 163 mg/dL
eAG (mmol/L): 9 mmol/L

## 2021-12-11 LAB — MAGNESIUM: Magnesium: 2.1 mg/dL (ref 1.5–2.5)

## 2021-12-11 LAB — TSH: TSH: 1.9 m[IU]/L (ref 0.40–4.50)

## 2021-12-18 ENCOUNTER — Other Ambulatory Visit: Payer: Self-pay | Admitting: Internal Medicine

## 2021-12-25 ENCOUNTER — Telehealth: Payer: Self-pay

## 2021-12-25 NOTE — Telephone Encounter (Signed)
Spoke to patient and reminded him to check INR

## 2021-12-26 LAB — POCT INR: INR: 2.2 (ref 2.0–3.0)

## 2021-12-27 ENCOUNTER — Ambulatory Visit (INDEPENDENT_AMBULATORY_CARE_PROVIDER_SITE_OTHER): Payer: Medicare Other | Admitting: Cardiology

## 2021-12-27 DIAGNOSIS — Z952 Presence of prosthetic heart valve: Secondary | ICD-10-CM

## 2021-12-27 DIAGNOSIS — Z7901 Long term (current) use of anticoagulants: Secondary | ICD-10-CM

## 2022-01-08 LAB — POCT INR: INR: 2.3 (ref 2.0–3.0)

## 2022-01-09 ENCOUNTER — Ambulatory Visit (INDEPENDENT_AMBULATORY_CARE_PROVIDER_SITE_OTHER): Payer: Medicare Other | Admitting: Cardiology

## 2022-01-09 DIAGNOSIS — Z7901 Long term (current) use of anticoagulants: Secondary | ICD-10-CM | POA: Diagnosis not present

## 2022-01-09 DIAGNOSIS — Z952 Presence of prosthetic heart valve: Secondary | ICD-10-CM

## 2022-01-14 ENCOUNTER — Other Ambulatory Visit: Payer: Self-pay

## 2022-01-14 DIAGNOSIS — B379 Candidiasis, unspecified: Secondary | ICD-10-CM

## 2022-01-14 MED ORDER — CLOTRIMAZOLE-BETAMETHASONE 1-0.05 % EX CREA: TOPICAL_CREAM | CUTANEOUS | 3 refills | Status: DC

## 2022-01-22 LAB — POCT INR: INR: 2.3 (ref 2.0–3.0)

## 2022-01-23 ENCOUNTER — Ambulatory Visit (INDEPENDENT_AMBULATORY_CARE_PROVIDER_SITE_OTHER): Payer: Medicare Other | Admitting: Cardiology

## 2022-01-23 DIAGNOSIS — Z7901 Long term (current) use of anticoagulants: Secondary | ICD-10-CM | POA: Diagnosis not present

## 2022-01-23 DIAGNOSIS — Z952 Presence of prosthetic heart valve: Secondary | ICD-10-CM

## 2022-02-05 LAB — POCT INR: INR: 2.8 (ref 2.0–3.0)

## 2022-02-06 ENCOUNTER — Ambulatory Visit (INDEPENDENT_AMBULATORY_CARE_PROVIDER_SITE_OTHER): Payer: Medicare Other | Admitting: Cardiology

## 2022-02-06 DIAGNOSIS — Z7901 Long term (current) use of anticoagulants: Secondary | ICD-10-CM

## 2022-02-06 DIAGNOSIS — Z952 Presence of prosthetic heart valve: Secondary | ICD-10-CM

## 2022-02-21 ENCOUNTER — Telehealth: Payer: Self-pay

## 2022-02-21 NOTE — Telephone Encounter (Signed)
Reminded patient to check INR 

## 2022-02-22 LAB — POCT INR: INR: 2.6 (ref 2.0–3.0)

## 2022-02-24 LAB — POCT INR: INR: 2.6 (ref 2.0–3.0)

## 2022-02-25 ENCOUNTER — Ambulatory Visit (INDEPENDENT_AMBULATORY_CARE_PROVIDER_SITE_OTHER): Payer: Medicare Other | Admitting: Pharmacist Clinician (PhC)/ Clinical Pharmacy Specialist

## 2022-02-25 DIAGNOSIS — Z952 Presence of prosthetic heart valve: Secondary | ICD-10-CM

## 2022-02-25 DIAGNOSIS — I482 Chronic atrial fibrillation, unspecified: Secondary | ICD-10-CM | POA: Diagnosis not present

## 2022-02-25 DIAGNOSIS — Z7901 Long term (current) use of anticoagulants: Secondary | ICD-10-CM

## 2022-03-04 ENCOUNTER — Other Ambulatory Visit: Payer: Self-pay | Admitting: Adult Health

## 2022-03-05 ENCOUNTER — Other Ambulatory Visit: Payer: Self-pay | Admitting: Internal Medicine

## 2022-03-09 LAB — POCT INR: INR: 2.7 (ref 2.0–3.0)

## 2022-03-10 ENCOUNTER — Ambulatory Visit (INDEPENDENT_AMBULATORY_CARE_PROVIDER_SITE_OTHER): Payer: Medicare Other | Admitting: Cardiology

## 2022-03-10 DIAGNOSIS — Z7901 Long term (current) use of anticoagulants: Secondary | ICD-10-CM

## 2022-03-10 DIAGNOSIS — Z952 Presence of prosthetic heart valve: Secondary | ICD-10-CM

## 2022-03-13 ENCOUNTER — Ambulatory Visit (INDEPENDENT_AMBULATORY_CARE_PROVIDER_SITE_OTHER): Payer: Medicare Other | Admitting: Internal Medicine

## 2022-03-13 ENCOUNTER — Encounter: Payer: Self-pay | Admitting: Internal Medicine

## 2022-03-13 VITALS — BP 138/88 | HR 75 | Temp 97.3°F | Resp 16 | Ht 68.0 in | Wt 204.4 lb

## 2022-03-13 DIAGNOSIS — Z8249 Family history of ischemic heart disease and other diseases of the circulatory system: Secondary | ICD-10-CM

## 2022-03-13 DIAGNOSIS — Z79899 Other long term (current) drug therapy: Secondary | ICD-10-CM

## 2022-03-13 DIAGNOSIS — Z0000 Encounter for general adult medical examination without abnormal findings: Secondary | ICD-10-CM

## 2022-03-13 DIAGNOSIS — E1169 Type 2 diabetes mellitus with other specified complication: Secondary | ICD-10-CM

## 2022-03-13 DIAGNOSIS — Z0001 Encounter for general adult medical examination with abnormal findings: Secondary | ICD-10-CM

## 2022-03-13 DIAGNOSIS — I2510 Atherosclerotic heart disease of native coronary artery without angina pectoris: Secondary | ICD-10-CM

## 2022-03-13 DIAGNOSIS — E538 Deficiency of other specified B group vitamins: Secondary | ICD-10-CM

## 2022-03-13 DIAGNOSIS — Z1211 Encounter for screening for malignant neoplasm of colon: Secondary | ICD-10-CM

## 2022-03-13 DIAGNOSIS — I10 Essential (primary) hypertension: Secondary | ICD-10-CM | POA: Diagnosis not present

## 2022-03-13 DIAGNOSIS — E559 Vitamin D deficiency, unspecified: Secondary | ICD-10-CM

## 2022-03-13 DIAGNOSIS — Z125 Encounter for screening for malignant neoplasm of prostate: Secondary | ICD-10-CM

## 2022-03-13 DIAGNOSIS — I700 Atherosclerosis of aorta: Secondary | ICD-10-CM

## 2022-03-13 DIAGNOSIS — D6869 Other thrombophilia: Secondary | ICD-10-CM

## 2022-03-13 DIAGNOSIS — N138 Other obstructive and reflux uropathy: Secondary | ICD-10-CM

## 2022-03-13 DIAGNOSIS — Z952 Presence of prosthetic heart valve: Secondary | ICD-10-CM

## 2022-03-13 DIAGNOSIS — E1122 Type 2 diabetes mellitus with diabetic chronic kidney disease: Secondary | ICD-10-CM | POA: Diagnosis not present

## 2022-03-13 DIAGNOSIS — Z951 Presence of aortocoronary bypass graft: Secondary | ICD-10-CM

## 2022-03-13 DIAGNOSIS — Z136 Encounter for screening for cardiovascular disorders: Secondary | ICD-10-CM

## 2022-03-13 DIAGNOSIS — E785 Hyperlipidemia, unspecified: Secondary | ICD-10-CM | POA: Diagnosis not present

## 2022-03-13 DIAGNOSIS — N182 Chronic kidney disease, stage 2 (mild): Secondary | ICD-10-CM

## 2022-03-13 NOTE — Progress Notes (Signed)
 "  Annual  Screening/Preventative Visit  & Comprehensive Evaluation & Examination  Future Appointments  Date Time Provider Department  03/13/2022                 CPE  2:00 PM Tonita Fallow, MD [facility]  06/19/2022               Wellness 10:30 AM Jeanine Knee, NP [facility]  03/17/2023                CPE  2:00 PM Tonita Fallow, MD [facility]            This very nice 81 y.o. WWM ( widowed Oct 2022 ) presents for a Screening /Preventative Visit & comprehensive evaluation and management of multiple medical co-morbidities.  Patient has been followed for HTN, HLD, T2_NIDDM  and Vitamin D  Deficiency.       HTN predates since  68. Patient's BP has been controlled at home.  In 2004, patient had a CABG &  St Jude AoVR and has been on Coumadin  since.  Today's BP is at goal  -  138/88.   Patient had a Negative Myoview in  2016 by Dr Mona.  In  2021, he was discovered in AFlutter by Dr  Mona.  Patient denies any cardiac symptoms as chest pain, palpitations, shortness of breath, dizziness or ankle swelling.       Patient's hyperlipidemia is controlled with diet and medications. Patient denies myalgias or other medication SE's. Last lipids were at goal :  Lab Results  Component Value Date   CHOL 127 10/29/2021   HDL 42 10/29/2021   LDLCALC 69 10/29/2021   TRIG 77 10/29/2021   CHOLHDL 3.0 10/29/2021         Patient has hx/o T2_NIDDM since 2004 w/CKD2  and  is on Metformin , Jardiance  & Glipizide .   Patient denies reactive hypoglycemic symptoms, visual blurring, diabetic polys or paresthesias. Last A1c was not at goal :   Lab Results  Component Value Date   HGBA1C 7.3 (H) 12/10/2021          Finally, patient has history of Vitamin D  Deficiency (28 /2012) and last vitamin D  was  still very low  ( goal 70-100) :   Lab Results  Component Value Date   VD25OH 25 (L) 07/09/2021     Current Outpatient Medications on File Prior to Visit  Medication Sig   aspirin 81 MG tablet Take   daily.   Cholecalciferol  5000 u take  daily.   CINNAMON 500 mg  Take   morning and  bedtime.    clotrimazole -betamethasone  cream Apply to rash 2 to 3 x /day   VITAMIN B-12 1000 MCG/ML inj INJECT 1/2 ML EVERY MONTH   enalapril  20 MG tablet Take 2 tablets daily.   famotidine  20 MG tablet TAKE 1 TABLET TWICE A DAY WITH MEALS    glipiZIDE  10 MG tablet TAKE 1 TAB 3 TIMES A DAY BEFORE MEALS    JARDIANCE  25 MG TABS tablet TAKE 1 TABLET  DAILY FOR DIABETES   metFORMIN  -XR  500 MG TAKE 2 TABLETS TWICE A DAY WITH MEALS    metoprolol  succinate-XL 50 MG  Take 1.5 tablets (75 mg total) by mouth daily. TAKE WITH OR IMMEDIATELY FOLLOWING A MEAL.   rosuvastatin  10 MG tablet TAKE 1 TABLET BY MOUTH EVERY DAY   warfarin 5 MG tablet TAKE 1/2 TO 1 TABLET BY MOUTH DAILY OR AS DIRECTED     No Known Allergies  Past Medical History:  Diagnosis Date   Anemia    AS (aortic stenosis)    valve replacement-st. jude mechanical valve   ASHD (arteriosclerotic heart disease)    CAD (coronary artery disease), native coronary artery    CABG 07/18/2003   Cancer (HCC)    prostate   GERD (gastroesophageal reflux disease)    History of nuclear stress test 02/12/10   EF 67%, normal perfusion   Hyperlipidemia    Hypertension    Morbid obesity (HCC)    Type II or unspecified type diabetes mellitus without mention of complication, not stated as uncontrolled    Umbilical hernia    Vitamin B12 deficiency    Vitamin D  deficiency      Health Maintenance  Topic Date Due   Zoster Vaccines- Shingrix (1 of 2) Never done   OPHTHALMOLOGY EXAM  11/14/2020   COVID-19 Vaccine (4 - Booster for Pfizer series) 02/12/2021   FOOT EXAM  01/03/2022   TETANUS/TDAP  04/02/2022 (Originally 11/24/2017)   HEMOGLOBIN A1C  06/09/2022   INFLUENZA VACCINE  06/24/2022   Pneumonia Vaccine 60+ Years old  Completed   HPV VACCINES  Aged Out   Hepatitis C Screening  Discontinued     Immunization History  Administered Date(s) Administered    Fluad Quad(high Dose ) 10/14/2020, 09/07/2021   Influenza  09/01/2012   Influenza, High Dose  09/27/2015, 10/14/2020   Influenza 08/25/2016, 08/24/2018, 09/13/2019   PFIZER Covid-19 Tri-Sucrose Vacc 12/18/2020   PFIZER-SARS-COV-2 Vacc 03/18/2020, 04/13/2020   Pneumococcal -13 12/18/2015   Pneumococcal -23 09/01/2012   Tdap 11/25/2007    Cologard- 11/30/2018 - Negative - Recc f/u    3 years due Jan 2023.   Past Surgical History:  Procedure Laterality Date   AORTIC VALVE REPLACEMENT  07/18/2003   22mm ATS-AP valve   AORTIC VALVE REPLACEMENT (AVR)/CORONARY ARTERY BYPASS GRAFTING (CABG)  07/18/2003   AVR; bypass x 2-SVG to first diag, SVG to posterior descending   CARDIAC CATHETERIZATION  07/13/2003   AS-aortic valve gradient , valve area 1.1; obstruction of ostium of posterior lateral branch with obstruction in the ostium of first diagnonal   HERNIA REPAIR  09/11/2011   RIH, umbilical   NM MYOCAR PERF WALL MOTION  02/12/2010   protocol:[place], EF 67%, exercise cap 7 METS, low risk scan   TEE WITHOUT CARDIOVERSION  07/18/2003   no evidence of PE, Aortic valve heavily calcified,    TRANSTHORACIC ECHOCARDIOGRAM  02/21/2013   EF60-65%, Septal motion showed paradox. atrium moderately dilated     Family History  Problem Relation Age of Onset   Stroke Mother    Heart attack Father    Heart disease Father    Cancer Brother    Cancer Sister      Social History   Tobacco Use   Smoking status: Never   Smokeless tobacco: Never  Substance Use Topics   Alcohol use: No   Drug use: No      ROS Constitutional: Denies fever, chills, weight loss/gain, headaches, insomnia,  night sweats or change in appetite. Does c/o fatigue. Eyes: Denies redness, blurred vision, diplopia, discharge, itchy or watery eyes.  ENT: Denies discharge, congestion, post nasal drip, epistaxis, sore throat, earache, hearing loss, dental pain, Tinnitus, Vertigo, Sinus pain or snoring.  Cardio: Denies  chest pain, palpitations, irregular heartbeat, syncope, dyspnea, diaphoresis, orthopnea, PND, claudication or edema Respiratory: denies cough, dyspnea, DOE, pleurisy, hoarseness, laryngitis or wheezing.  Gastrointestinal: Denies dysphagia, heartburn, reflux, water brash, pain, cramps, nausea, vomiting,  bloating, diarrhea, constipation, hematemesis, melena, hematochezia, jaundice or hemorrhoids Genitourinary: Denies dysuria, frequency, discharge, hematuria or flank pain. Has urgency, nocturia x 2-3 & occasional hesitancy. Musculoskeletal: Denies arthralgia, myalgia, stiffness, Jt. Swelling, pain, limp or strain/sprain. Denies Falls. Skin: Denies puritis, rash, hives, warts, acne, eczema or change in skin lesion Neuro: No weakness, tremor, incoordination, spasms, paresthesia or pain Psychiatric: Denies confusion, memory loss or sensory loss. Denies Depression. Endocrine: Denies change in weight, skin, hair change, nocturia, and paresthesia, diabetic polys, visual blurring or hyper / hypo glycemic episodes.  Heme/Lymph: No excessive bleeding, bruising or enlarged lymph nodes.   Physical Exam  BP 138/88   Pulse 75   Temp (!) 97.3 F (36.3 C)   Resp 16   Ht 5' 8 (1.727 m)   Wt 204 lb 6.4 oz (92.7 kg)   SpO2 98%   BMI 31.08 kg/m   General Appearance: Well nourished and well groomed and in no apparent distress.  Eyes: PERRLA, EOMs, conjunctiva no swelling or erythema, normal fundi and vessels. Sinuses: No frontal/maxillary tenderness ENT/Mouth: EACs patent / TMs  nl. Nares clear without erythema, swelling, mucoid exudates. Oral hygiene is good. No erythema, swelling, or exudate. Tongue normal, non-obstructing. Tonsils not swollen or erythematous. Hearing normal.  Neck: Supple, thyroid  not palpable. No bruits, nodes or JVD. Respiratory: Respiratory effort normal.  BS equal and clear bilateral without rales, rhonci, wheezing or stridor. Cardio: Heart sounds are normal with regular rate and  rhythm and no murmurs, rubs or gallops. Peripheral pulses are normal and equal bilaterally without edema. No aortic or femoral bruits. Chest: symmetric with normal excursions and percussion.  Abdomen: Soft, with Nl bowel sounds. Nontender, no guarding, rebound, hernias, masses, or organomegaly.  Lymphatics: Non tender without lymphadenopathy.  Musculoskeletal: Full ROM all peripheral extremities, joint stability, 5/5 strength, and normal gait. Skin: Warm and dry without rashes, lesions, cyanosis, clubbing or  ecchymosis.  Neuro: Cranial nerves intact, reflexes equal bilaterally. Normal muscle tone, no cerebellar symptoms. Sensation intact to touch, vibratory and Monofilament to the toes bilaterally. Pysch: Alert and oriented X 3 with normal affect, insight and judgment appropriate.   Assessment and Plan  1. Annual Preventative/Screening Exam    2. Essential hypertension  - EKG 12-Lead - US , RETROPERITNL ABD,  LTD - Urinalysis, Routine w reflex microscopic - Microalbumin / creatinine urine ratio - CBC with Differential/Platelet - COMPLETE METABOLIC PANEL WITH GFR - Magnesium - TSH  3. Hyperlipidemia associated with type 2 diabetes mellitus (HCC)  - EKG 12-Lead - US , RETROPERITNL ABD,  LTD - Lipid panel - TSH  4. Type 2 diabetes mellitus with stage 2 chronic kidney  disease, without long-term current use of insulin  (HCC)  - EKG 12-Lead - US , RETROPERITNL ABD,  LTD - Urinalysis, Routine w reflex microscopic - Microalbumin / creatinine urine ratio - HM DIABETES FOOT EXAM - PR LOW EXTEMITY NEUR EXAM DOCUM - Hemoglobin A1c - Insulin , random  5. Vitamin D  deficiency  - VITAMIN D  25 Hydroxy  6. Coronary artery disease due to lipid rich plaque  - EKG 12-Lead - Lipid panel  7. Hx of CABG  - EKG 12-Lead - Lipid panel  8. H/O mechanical aortic valve replacement  - EKG 12-Lead  9. Aortic atherosclerosis (HCC) by CTA 06/2020  - EKG 12-Lead - US , RETROPERITNL ABD,   LTD - Lipid panel  10. BPH with obstruction/lower urinary tract symptoms  - PSA  11. Screening for colorectal cancer  - Cologuard  12. Prostate cancer screening  -  PSA  13. Screening for heart disease  - EKG 12-Lead  14. FHx: heart disease  - EKG 12-Lead - US , RETROPERITNL ABD,  LTD  15. Screening for AAA (aortic abdominal aneurysm)  - US , RETROPERITNL ABD,  LTD  16. Acquired thrombophilia (HCC)   17. Medication management  - Urinalysis, Routine w reflex microscopic - Microalbumin / creatinine urine ratio - CBC with Differential/Platelet - COMPLETE METABOLIC PANEL WITH GFR - Magnesium - Lipid panel - TSH - Hemoglobin A1c - Insulin , random  18. Vitamin B12 deficiency   - Vitamin B12       Patient was counseled in prudent diet, weight control to achieve/maintain BMI less than 25, BP monitoring, regular exercise and medications as discussed.  Discussed med effects and SE's. Routine screening labs and tests as requested with regular follow-up as recommended. Over 40 minutes of exam, counseling, chart review and high complex critical decision making was performed   Elsie JONETTA Richards, MD  "

## 2022-03-13 NOTE — Progress Notes (Signed)
Aorta Scan <3 

## 2022-03-13 NOTE — Patient Instructions (Signed)

## 2022-03-14 ENCOUNTER — Other Ambulatory Visit: Payer: Self-pay | Admitting: Internal Medicine

## 2022-03-14 LAB — INSULIN, RANDOM: Insulin: 13 u[IU]/mL

## 2022-03-14 LAB — LIPID PANEL
Cholesterol: 145 mg/dL
HDL: 43 mg/dL
LDL Cholesterol (Calc): 79 mg/dL
Non-HDL Cholesterol (Calc): 102 mg/dL
Total CHOL/HDL Ratio: 3.4 (calc)
Triglycerides: 136 mg/dL

## 2022-03-14 LAB — COMPLETE METABOLIC PANEL WITHOUT GFR
AG Ratio: 1.7 (calc) (ref 1.0–2.5)
ALT: 16 U/L (ref 9–46)
AST: 16 U/L (ref 10–35)
Albumin: 4 g/dL (ref 3.6–5.1)
Alkaline phosphatase (APISO): 72 U/L (ref 35–144)
BUN: 14 mg/dL (ref 7–25)
CO2: 22 mmol/L (ref 20–32)
Calcium: 9.1 mg/dL (ref 8.6–10.3)
Chloride: 106 mmol/L (ref 98–110)
Creat: 0.78 mg/dL (ref 0.70–1.28)
Globulin: 2.3 g/dL (ref 1.9–3.7)
Glucose, Bld: 175 mg/dL — ABNORMAL HIGH (ref 65–99)
Potassium: 4.4 mmol/L (ref 3.5–5.3)
Sodium: 139 mmol/L (ref 135–146)
Total Bilirubin: 1.2 mg/dL (ref 0.2–1.2)
Total Protein: 6.3 g/dL (ref 6.1–8.1)
eGFR: 91 mL/min/{1.73_m2}

## 2022-03-14 LAB — URINALYSIS, ROUTINE W REFLEX MICROSCOPIC
Bilirubin Urine: NEGATIVE
Hgb urine dipstick: NEGATIVE
Ketones, ur: NEGATIVE
Leukocytes,Ua: NEGATIVE
Nitrite: NEGATIVE
Protein, ur: NEGATIVE
Specific Gravity, Urine: 1.037 — ABNORMAL HIGH (ref 1.001–1.035)
pH: <=5 (ref 5.0–8.0)

## 2022-03-14 LAB — CBC WITH DIFFERENTIAL/PLATELET
Absolute Monocytes: 490 {cells}/uL (ref 200–950)
Basophils Absolute: 41 {cells}/uL (ref 0–200)
Basophils Relative: 0.7 %
Eosinophils Absolute: 112 {cells}/uL (ref 15–500)
Eosinophils Relative: 1.9 %
HCT: 46.8 % (ref 38.5–50.0)
Hemoglobin: 16.4 g/dL (ref 13.2–17.1)
Lymphs Abs: 1186 {cells}/uL (ref 850–3900)
MCH: 34 pg — ABNORMAL HIGH (ref 27.0–33.0)
MCHC: 35 g/dL (ref 32.0–36.0)
MCV: 97.1 fL (ref 80.0–100.0)
MPV: 11.4 fL (ref 7.5–12.5)
Monocytes Relative: 8.3 %
Neutro Abs: 4071 {cells}/uL (ref 1500–7800)
Neutrophils Relative %: 69 %
Platelets: 146 10*3/uL (ref 140–400)
RBC: 4.82 Million/uL (ref 4.20–5.80)
RDW: 12.7 % (ref 11.0–15.0)
Total Lymphocyte: 20.1 %
WBC: 5.9 10*3/uL (ref 3.8–10.8)

## 2022-03-14 LAB — PSA: PSA: 0.04 ng/mL

## 2022-03-14 LAB — HEMOGLOBIN A1C
Hgb A1c MFr Bld: 7.3 %{Hb} — ABNORMAL HIGH
Mean Plasma Glucose: 163 mg/dL
eAG (mmol/L): 9 mmol/L

## 2022-03-14 LAB — VITAMIN B12: Vitamin B-12: 202 pg/mL (ref 200–1100)

## 2022-03-14 LAB — VITAMIN D 25 HYDROXY (VIT D DEFICIENCY, FRACTURES): Vit D, 25-Hydroxy: 67 ng/mL (ref 30–100)

## 2022-03-14 LAB — MAGNESIUM: Magnesium: 2 mg/dL (ref 1.5–2.5)

## 2022-03-14 LAB — MICROALBUMIN / CREATININE URINE RATIO
Creatinine, Urine: 50 mg/dL (ref 20–320)
Microalb Creat Ratio: 22 ug/mg{creat}
Microalb, Ur: 1.1 mg/dL

## 2022-03-14 LAB — TSH: TSH: 1.61 m[IU]/L (ref 0.40–4.50)

## 2022-03-14 MED ORDER — TIRZEPATIDE 2.5 MG/0.5ML ~~LOC~~ SOAJ: 2.5000 mg | SUBCUTANEOUS | 0 refills | Status: DC

## 2022-03-14 NOTE — Progress Notes (Signed)
<><><><><><><><><><><><><><><><><><><><><><><><><><><><><><><><><> ?<><><><><><><><><><><><><><><><><><><><><><><><><><><><><><><><><> ? ?-   Glucose = 175 mg%  ? ?- A1c = 7.3%    ( 12 week average glucose = 163 mg% )  ? ?- Both too high - Recommend start Mounjaro 2.5 mg pen q week x 4 pens  ? ?Then call in for refills & tell if losing weight or not  ? ?- if not losing weight then will increase to 5.0 mg pen  ( then 7.5, 10, 12.5,  15 mg)  ? ?- potentially with weight loss will eventually be able to  ?                                                                          stop diabetic meds & cure Diabetes ? ?<><><><><><><><><><><><><><><><><><><><><><><><><><><><><><><><><> ? ?- Vitamin D = 67 - ( was 25 ) - EXCELLENT - Please keep dose same    ? ?<><><><><><><><><><><><><><><><><><><><><><><><><><><><><><><><><> ? ?-  Vitamin B12 =  202     Very  Very Low  ?(Ideal or Goal Vit B12 is between 450 - 1,100)  ? ?Low Vit B12 may be associated with    Anemia,      Fatigue,  ? ?Peripheral Neuropathy,       Dementia,      "Brain Fog"     &       Depression ? ?<>0<>0<>0<>0<>0<>0<>0<>0<>0<>0<>0<>0<>0<>0<>0<>0<>0<>0<>0<>0<>0<>0<>0 ? ?- Recommend take a sub-lingual form of Vitamin B12 tablet  ? ?                                                                          1,000 to 5,000 mcg tab  ? ?                                                                     that you dissolve under your tongue /Daily  ? ?- Can get [place] - best price at [employer] or on [place] ? ?<><><><><><><><><><><><><><><><><><><><><><><><><><><><><><><><><> ? ?- PSA is very very Low  - Great   !  ? ?<><><><><><><><><><><><><><><><><><><><><><><><><><><><><><><><><> ? ?- Total Chol = 145     &     LDL Chol = 79    -   Both     Excellent  ? ?- Very low risk for Heart Attack  / Stroke ? ?<><><><><><><><><><><><><><><><><><><><><><><><><><><><><><><><><> ? ?- All Else - CBC - Kidneys - Electrolytes - Liver - Magnesium & Thyroid   ? ?- all  Normal /  OK ? ?<>0<>0<>0<>0<>0<>0<>0<>0<>0<>0<>0<>0<>0<>0<>0<>0<>0<>0<>0<>0<>0<>0<>0 ?<>0<>0<>0<>0<>0<>0<>0<>0<>0<>0<>0<>0<>0<>0<>0<>0<>0<>0<>0<>0<>0<>0<>0 ? ?

## 2022-03-17 NOTE — Progress Notes (Signed)
Pt is aware of lab results and recommendations, did not have any questions for the provider or nurse

## 2022-03-24 LAB — POCT INR: INR: 2.2 (ref 2.0–3.0)

## 2022-03-27 ENCOUNTER — Ambulatory Visit (INDEPENDENT_AMBULATORY_CARE_PROVIDER_SITE_OTHER): Payer: Medicare Other | Admitting: Cardiovascular Disease

## 2022-03-27 DIAGNOSIS — Z7901 Long term (current) use of anticoagulants: Secondary | ICD-10-CM

## 2022-03-27 DIAGNOSIS — Z952 Presence of prosthetic heart valve: Secondary | ICD-10-CM

## 2022-03-31 DIAGNOSIS — Z1212 Encounter for screening for malignant neoplasm of rectum: Secondary | ICD-10-CM | POA: Diagnosis not present

## 2022-03-31 DIAGNOSIS — Z1211 Encounter for screening for malignant neoplasm of colon: Secondary | ICD-10-CM | POA: Diagnosis not present

## 2022-04-02 ENCOUNTER — Ambulatory Visit: Payer: PPO | Admitting: Adult Health

## 2022-04-07 LAB — POCT INR: INR: 2.6 (ref 2.0–3.0)

## 2022-04-08 ENCOUNTER — Ambulatory Visit (INDEPENDENT_AMBULATORY_CARE_PROVIDER_SITE_OTHER): Payer: Medicare Other

## 2022-04-08 DIAGNOSIS — Z952 Presence of prosthetic heart valve: Secondary | ICD-10-CM | POA: Diagnosis not present

## 2022-04-08 DIAGNOSIS — Z7901 Long term (current) use of anticoagulants: Secondary | ICD-10-CM

## 2022-04-08 NOTE — Patient Instructions (Signed)
Description   ?Continue the same dose of warfarin 1/2 a tablet daily except for 1 tablet on Mondays and Fridays.  ?Recheck INR in 2 weeks.  ?[facility] [phone] ?  ?   ?

## 2022-04-11 LAB — COLOGUARD: COLOGUARD: NEGATIVE

## 2022-04-11 NOTE — Progress Notes (Signed)
<><><><><><><><><><><><><><><><><><><><><><><><><><><><><><><><><> <><><><><><><><><><><><><><><><><><><><><><><><><><><><><><><><><>  -    Cologard returned Negative -  Great  - Recommend Repeat in 3 years - May 2026  <><><><><><><><><><><><><><><><><><><><><><><><><><><><><><><><><> <><><><><><><><><><><><><><><><><><><><><><><><><><><><><><><><><>

## 2022-04-12 ENCOUNTER — Other Ambulatory Visit: Payer: Self-pay | Admitting: Internal Medicine

## 2022-04-12 MED ORDER — TIRZEPATIDE 5 MG/0.5ML ~~LOC~~ SOAJ: 5.0000 mg | SUBCUTANEOUS | 0 refills | Status: DC

## 2022-04-23 LAB — POCT INR: INR: 3.1 — AB (ref 2.0–3.0)

## 2022-04-24 ENCOUNTER — Ambulatory Visit (INDEPENDENT_AMBULATORY_CARE_PROVIDER_SITE_OTHER): Payer: Medicare Other | Admitting: Cardiology

## 2022-04-24 DIAGNOSIS — Z7901 Long term (current) use of anticoagulants: Secondary | ICD-10-CM | POA: Diagnosis not present

## 2022-04-24 DIAGNOSIS — Z952 Presence of prosthetic heart valve: Secondary | ICD-10-CM

## 2022-05-02 ENCOUNTER — Other Ambulatory Visit: Payer: Self-pay | Admitting: Internal Medicine

## 2022-05-08 ENCOUNTER — Other Ambulatory Visit: Payer: Self-pay | Admitting: Physician Assistant

## 2022-05-09 ENCOUNTER — Ambulatory Visit (INDEPENDENT_AMBULATORY_CARE_PROVIDER_SITE_OTHER): Payer: Medicare Other | Admitting: *Deleted

## 2022-05-09 DIAGNOSIS — Z952 Presence of prosthetic heart valve: Secondary | ICD-10-CM | POA: Diagnosis not present

## 2022-05-09 DIAGNOSIS — Z7901 Long term (current) use of anticoagulants: Secondary | ICD-10-CM | POA: Diagnosis not present

## 2022-05-09 LAB — POCT INR: INR: 3 (ref 2.0–3.0)

## 2022-05-23 ENCOUNTER — Telehealth: Payer: Self-pay

## 2022-05-23 DIAGNOSIS — Z952 Presence of prosthetic heart valve: Secondary | ICD-10-CM | POA: Diagnosis not present

## 2022-05-23 DIAGNOSIS — I480 Paroxysmal atrial fibrillation: Secondary | ICD-10-CM | POA: Diagnosis not present

## 2022-05-23 DIAGNOSIS — Z7901 Long term (current) use of anticoagulants: Secondary | ICD-10-CM | POA: Diagnosis not present

## 2022-05-23 NOTE — Telephone Encounter (Signed)
I spoke to the patient who is awaiting test strips to check INR.  He will reach out to the company and try to get strips by next week or call us to schedule INR check.

## 2022-05-23 NOTE — Telephone Encounter (Signed)
Called pt since received INR results via fax. He states he did call in the number but it was not a real number cause he was alerting the company he need strips. Advised pt never to call in a "bogus" INR result and he verbalized understanding. He states he will not do again and his shipment is on the way. Advised to do the test when he receives the strips and call to [place] and verbalized understanding.

## 2022-05-28 ENCOUNTER — Telehealth: Payer: Self-pay | Admitting: *Deleted

## 2022-05-28 NOTE — Telephone Encounter (Signed)
Called pt since he has been awaiting test strips from his company. He stated he has not received but was told by the company they have been shipped out. Will follow up with pt on tomorrow if not received, pt advised will need to make an appt in the office to be seen and he verbalized understanding.

## 2022-05-29 ENCOUNTER — Ambulatory Visit (INDEPENDENT_AMBULATORY_CARE_PROVIDER_SITE_OTHER): Payer: Medicare Other | Admitting: *Deleted

## 2022-05-29 DIAGNOSIS — Z952 Presence of prosthetic heart valve: Secondary | ICD-10-CM

## 2022-05-29 DIAGNOSIS — Z7901 Long term (current) use of anticoagulants: Secondary | ICD-10-CM | POA: Diagnosis not present

## 2022-05-29 LAB — POCT INR: INR: 2.8 (ref 2.0–3.0)

## 2022-05-29 NOTE — Patient Instructions (Addendum)
Description   Spoke with pt and advised to continue taking warfarin 1/2 tablet daily except for 1 tablet on Mondays and Fridays. Recheck INR in 2 weeks (self tester). [facility] [phone]     

## 2022-06-09 ENCOUNTER — Ambulatory Visit (INDEPENDENT_AMBULATORY_CARE_PROVIDER_SITE_OTHER): Payer: Medicare Other

## 2022-06-09 DIAGNOSIS — Z952 Presence of prosthetic heart valve: Secondary | ICD-10-CM | POA: Diagnosis not present

## 2022-06-09 DIAGNOSIS — Z7901 Long term (current) use of anticoagulants: Secondary | ICD-10-CM | POA: Diagnosis not present

## 2022-06-09 LAB — POCT INR: INR: 2.2 (ref 2.0–3.0)

## 2022-06-09 NOTE — Patient Instructions (Signed)
Description   Spoke with pt and advised to continue taking warfarin 1/2 tablet daily except for 1 tablet on Mondays and Fridays. Recheck INR in 2 weeks (self tester). [facility] [phone]     

## 2022-06-16 ENCOUNTER — Telehealth: Payer: Self-pay | Admitting: Internal Medicine

## 2022-06-16 NOTE — Telephone Encounter (Signed)
Patient states he was calling to have his CT rescd. Please advise

## 2022-06-17 ENCOUNTER — Other Ambulatory Visit: Payer: Self-pay

## 2022-06-17 DIAGNOSIS — Z01812 Encounter for preprocedural laboratory examination: Secondary | ICD-10-CM

## 2022-06-17 NOTE — Telephone Encounter (Signed)
Called patient, advised to call central scheduled to get rescheduled. I gave him the number to call.  He also was needing blood work before- I ordered BMET, patient aware to come get that completed before his CT can be completed.   Routing to primary RN as Juluis Rainier. Thanks!

## 2022-06-19 ENCOUNTER — Ambulatory Visit: Payer: Medicare Other | Admitting: Adult Health

## 2022-06-19 ENCOUNTER — Encounter: Payer: Self-pay | Admitting: Nurse Practitioner

## 2022-06-19 ENCOUNTER — Ambulatory Visit (INDEPENDENT_AMBULATORY_CARE_PROVIDER_SITE_OTHER): Payer: Medicare Other | Admitting: Nurse Practitioner

## 2022-06-19 VITALS — BP 150/66 | HR 70 | Temp 97.7°F | Ht 69.0 in | Wt 204.6 lb

## 2022-06-19 DIAGNOSIS — Z0000 Encounter for general adult medical examination without abnormal findings: Secondary | ICD-10-CM

## 2022-06-19 DIAGNOSIS — Z952 Presence of prosthetic heart valve: Secondary | ICD-10-CM

## 2022-06-19 DIAGNOSIS — E1169 Type 2 diabetes mellitus with other specified complication: Secondary | ICD-10-CM

## 2022-06-19 DIAGNOSIS — E1122 Type 2 diabetes mellitus with diabetic chronic kidney disease: Secondary | ICD-10-CM

## 2022-06-19 DIAGNOSIS — D6869 Other thrombophilia: Secondary | ICD-10-CM

## 2022-06-19 DIAGNOSIS — B379 Candidiasis, unspecified: Secondary | ICD-10-CM

## 2022-06-19 DIAGNOSIS — E559 Vitamin D deficiency, unspecified: Secondary | ICD-10-CM

## 2022-06-19 DIAGNOSIS — I7121 Aneurysm of the ascending aorta, without rupture: Secondary | ICD-10-CM

## 2022-06-19 DIAGNOSIS — Z7901 Long term (current) use of anticoagulants: Secondary | ICD-10-CM

## 2022-06-19 DIAGNOSIS — Z0001 Encounter for general adult medical examination with abnormal findings: Secondary | ICD-10-CM

## 2022-06-19 DIAGNOSIS — E66811 Obesity, class 1: Secondary | ICD-10-CM

## 2022-06-19 DIAGNOSIS — I10 Essential (primary) hypertension: Secondary | ICD-10-CM

## 2022-06-19 DIAGNOSIS — R6889 Other general symptoms and signs: Secondary | ICD-10-CM

## 2022-06-19 DIAGNOSIS — I700 Atherosclerosis of aorta: Secondary | ICD-10-CM

## 2022-06-19 DIAGNOSIS — E782 Mixed hyperlipidemia: Secondary | ICD-10-CM | POA: Diagnosis not present

## 2022-06-19 DIAGNOSIS — I2510 Atherosclerotic heart disease of native coronary artery without angina pectoris: Secondary | ICD-10-CM

## 2022-06-19 DIAGNOSIS — Z951 Presence of aortocoronary bypass graft: Secondary | ICD-10-CM

## 2022-06-19 DIAGNOSIS — I482 Chronic atrial fibrillation, unspecified: Secondary | ICD-10-CM

## 2022-06-19 DIAGNOSIS — E669 Obesity, unspecified: Secondary | ICD-10-CM

## 2022-06-19 DIAGNOSIS — E538 Deficiency of other specified B group vitamins: Secondary | ICD-10-CM

## 2022-06-19 DIAGNOSIS — K429 Umbilical hernia without obstruction or gangrene: Secondary | ICD-10-CM

## 2022-06-19 DIAGNOSIS — Z79899 Other long term (current) drug therapy: Secondary | ICD-10-CM

## 2022-06-19 MED ORDER — CLOTRIMAZOLE-BETAMETHASONE 1-0.05 % EX CREA: TOPICAL_CREAM | CUTANEOUS | 3 refills | Status: DC

## 2022-06-19 NOTE — Patient Instructions (Signed)
Semaglutide Tablets What is this medication? SEMAGLUTIDE (SEM a GLOO tide) treats type 2 diabetes. It works by increasing insulin levels in your body, which decreases your blood sugar (glucose). It also reduces the amount of sugar released into the blood and slows down your digestion. Changes to diet and exercise are often combined with this medication. This medicine may be used for other purposes; ask your health care provider or pharmacist if you have questions. COMMON BRAND NAME(S): Rybelsus What should I tell my care team before I take this medication? They need to know if you have any of these conditions: Endocrine tumors (MEN 2) or if someone in your family had these tumors Eye disease History of pancreatitis Kidney disease Stomach or intestine problems Thyroid cancer or if someone in your family had thyroid cancer Vision problems An unusual or allergic reaction to semaglutide, other medications, foods, dyes, or preservatives Pregnant or trying to get pregnant Breast-feeding How should I use this medication? Take this medication by mouth. Take it as directed on the prescription label at the same time every day. Take the dose right after waking up. Do not eat or drink anything before taking it. Do not take it with any other drink except a glass of plain water that is less than 4 ounces (less than 120 mL). Do not cut, crush or chew this medication. Swallow the tablets whole. After taking it, do not eat breakfast, drink, or take any other medications or vitamins for at least 30 minutes. Keep taking it unless your care team tells you to stop. A special MedGuide will be given to you by the pharmacist with each prescription and refill. Be sure to read this information carefully each time. Talk to your care team about the use of this medication in children. Special care may be needed. Overdosage: If you think you have taken too much of this medicine contact a poison control center or emergency  room at once. NOTE: This medicine is only for you. Do not share this medicine with others. What if I miss a dose? If you miss a dose, skip it. Take your next dose at the normal time. Do not take extra or 2 doses at the same time to make up for the missed dose. What may interact with this medication? What may interact with this medication? Aminophylline Carbamazepine Cyclosporine Digoxin Levothyroxine Other medications for diabetes Phenytoin Tacrolimus Theophylline Warfarin Many medications may cause changes in blood sugar, these include: Alcohol containing beverages Antiviral medications for HIV or AIDS Aspirin and aspirin-like medications Certain medications for blood pressure, heart disease, irregular heart beat Chromium Diuretics Male hormones, such as estrogens or progestins, birth control pills Fenofibrate Gemfibrozil Isoniazid Lanreotide Male hormones or anabolic steroids MAOIs like Carbex, Eldepryl, Marplan, Nardil, and Parnate Medications for weight loss Medications for allergies, asthma, cold, or cough Medications for depression, anxiety, or psychotic disturbances Niacin Nicotine NSAIDs, medications for pain and inflammation, like ibuprofen or naproxen Octreotide Pasireotide Pentamidine Phenytoin Probenecid Quinolone antibiotics such as ciprofloxacin, levofloxacin, ofloxacin Some herbal dietary supplements Steroid medications such as prednisone or cortisone Sulfamethoxazole; trimethoprim Thyroid hormones Some medications can hide the warning symptoms of low blood sugar (hypoglycemia). You may need to monitor your blood sugar more closely if you are taking one of these medications. These include: Beta-blockers, often used for high blood pressure or heart problems (examples include atenolol, metoprolol, propranolol) Clonidine Guanethidine Reserpine This list may not describe all possible interactions. Give your health care provider a list of all the  medicines, herbs, non-prescription drugs, or dietary supplements you use. Also tell them if you smoke, drink alcohol, or use illegal drugs. Some items may interact with your medicine. What should I watch for while using this medication? Visit your care team for regular checks on your progress. Check with your care team if you have severe diarrhea, nausea, and vomiting, or if you sweat a lot. The loss of too much body fluid may make it dangerous for you to take this medication. A test called the HbA1C (A1C) will be monitored. This is a simple blood test. It measures your blood sugar control over the last 2 to 3 months. You will receive this test every 3 to 6 months. Learn how to check your blood sugar. Learn the symptoms of low and high blood sugar and how to manage them. Always carry a quick-source of sugar with you in case you have symptoms of low blood sugar. Examples include hard sugar candy or glucose tablets. Make sure others know that you can choke if you eat or drink when you develop serious symptoms of low blood sugar, such as seizures or unconsciousness. Get medical help at once. Tell your care team if you have high blood sugar. You might need to change the dose of your medication. If you are sick or exercising more than usual, you might need to change the dose of your medication. Do not skip meals. Ask your care team if you should avoid alcohol. Many nonprescription cough and cold products contain sugar or alcohol. These can affect blood sugar. Wear a medical ID bracelet or chain. Carry a card that describes your condition. List the medications and doses you take on the card. Do not become pregnant while taking this medication. Women should inform their care team if they wish to become pregnant or think they might be pregnant. There is a potential for serious side effects to an unborn child. Talk to your care team for more information. Do not breast-feed an infant while taking this  medication. What side effects may I notice from receiving this medication? Side effects that you should report to your care team as soon as possible: Allergic reactions--skin rash, itching, hives, swelling of the face, lips, tongue, or throat Change in vision Dehydration--increased thirst, dry mouth, feeling faint or lightheaded, headache, dark yellow or brown urine Gallbladder problems--severe stomach pain, nausea, vomiting, fever Heart palpitations--rapid, pounding, or irregular heartbeat Kidney injury--decrease in the amount of urine, swelling of the ankles, hands, or feet Pancreatitis--severe stomach pain that spreads to your back or gets worse after eating or when touched, fever, nausea, vomiting Thyroid cancer--new mass or lump in the neck, pain or trouble swallowing, trouble breathing, hoarseness Side effects that usually do not require medical attention (report to your care team if they continue or are bothersome): Diarrhea Loss of appetite Nausea Stomach pain Vomiting This list may not describe all possible side effects. Call your doctor for medical advice about side effects. You may report side effects to FDA at 1-800-FDA-1088. Where should I keep my medication? Keep out of the reach of children and pets. Store at room temperature between 20 and 25 degrees C (68 and 77 degrees F). Keep this medication in the original container. Protect from moisture. Keep the container tightly closed. Get rid of any unused medication after the expiration date. To get rid of medications that are no longer needed or have expired: Take the medication to a medication take-back program. Check with your pharmacy or law enforcement to  find a location. If you cannot return the medication, check the label or package insert to see if the medication should be thrown out in the garbage or flushed down the toilet. If you are not sure, ask your care team. If it is safe to put it in the trash, take the medication  out of the container. Mix the medication with cat litter, dirt, coffee grounds, or other unwanted substance. Seal the mixture in a bag or container. Put it in the trash. NOTE: This sheet is a summary. It may not cover all possible information. If you have questions about this medicine, talk to your doctor, pharmacist, or health care provider.  2023 [employer]/Gold Standard (2021-03-20 00:00:00)

## 2022-06-19 NOTE — Progress Notes (Signed)
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   Coronary artery disease due to lipid rich plaque Control blood pressure, cholesterol, glucose, increase exercise.  Followed by cardiology Check lipids   Essential hypertension Controlled Continue medications;  Discussed DASH (Dietary Approaches to Stop Hypertension) DASH diet is lower in sodium than a typical American diet. Cut back on foods that are high in saturated fat, cholesterol, and trans fats. Eat more whole-grain foods, fish, poultry, and nuts Remain active and exercise as tolerated daily.  Monitor BP at home-Call if greater than 130/80.    H/O mechanical aortic valve replacement Continue coumadin; followed by cardiology  Hx of CABG Control blood pressure, cholesterol, glucose, increase exercise.  Followed by cardiolgy  Acquired thrombophilia (HCC)/ Long term current use of anticoagulant therapy Managed by cardiology Discussed if patient falls to immediately contact office or go to ER. Discussed foods that can increase or decrease Coumadin levels.  Patient understands to call the office before starting a new medication.  Type II diabetes mellitus with nephropathy ([place]) Will not refill Jardiance - not covered. No longer taking Mounjaro Have supplied patient with Rybelsus 3 mg Continue Metformin and Glipizide Education: Reviewed 'ABCs' of diabetes management  A1C (<7) Blood  pressure (<130/80) Cholesterol (LDL <70) Continue Eye Exam yearly  Continue Dental Exam Q6 mo Discussed dietary recommendations Discussed Physical Activity recommendations Foot exam UTD Check A1C   Mixed hyperlipidemia associated with T2DM (HCC) Controlled Continue medications; Rosuvastatin Discussed lifestyle modifications. Recommended diet heavy in fruits and veggies, omega 3's. Decrease consumption of animal meats, cheeses, and dairy products. Remain active and exercise as tolerated. Continue to monitor.   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 Stay well hydrated. Avoid high salt foods. Avoid NSAIDS. Keep BP and BG well controlled.   Take medications as prescribed. Remain active and exercise as tolerated daily. Maintain weight.  Continue to monitor. Check CMP/GFR  Obesity (BMI 30.0-34.9) Discussed appropriate BMI Goal of losing 1 lb per month. Diet modification. Physical activity. Encouraged/praised to build confidence.   Umbilical hernia without obstruction and without gangrene No complications; continue to monitor  Vitamin B12 deficiency Continue supplement; check levels at CPE  Vitamin D deficiency Patient admits taking irregularly, encouraged daily 5000 IU, take with all other meds Check vitamin D levels  Medication management All medications discussed and reviewed in full. All questions and concerns regarding medications addressed.      Over 30 minutes of exam, counseling, chart review, and critical decision making was performed  Future Appointments  Date Time Provider [place]  06/30/2022 10:30 AM [facility] 2 [facility] [place]  09/23/2022 10:30 AM Unk Pinto, MD [facility] None  03/17/2023  2:00 PM Unk Pinto, MD [facility] None  06/22/2023 11:00 AM Darrol Jump, NP [facility] None     Plan:   During the course of the visit the patient was educated and counseled  about appropriate screening and preventive services including:   Pneumococcal vaccine  Influenza vaccine Prevnar 13 Td vaccine Screening electrocardiogram Colorectal cancer screening Diabetes screening Glaucoma screening Nutrition counseling    Subjective:  Billy Reilly is a 81 y.o. male with hx of ASCAD/CABG/AoVR who presents for Medicare Annual Wellness Visit and 3 month follow up for HTN, hyperlipidemia, T2DM, and vitamin D Def.   Wife's health is not doing well, advanced kidney disease and discussing starting dialysis.   GERD is well controlled by famotidine 20 mg BID.   BMI is Body mass index is 30.21 kg/m., he has been working on diet and exercise. Weight goal < 200 lb, he has been reducing breakfast meat, eggs, doing oatmeal instead, doing protein + veggies for meal whenever possible, limited evening intake.  He drinks diet mountain dew - <1 bottle daily. He drinks unsweet tea.  Wt Readings from Last 3 Encounters:  06/19/22 204 lb 9.6 oz (92.8 kg)  03/13/22 204 lb 6.4 oz (92.7 kg)  12/10/21 204 lb 6.4 oz (92.7 kg)   He has hx of CABG & Mech AoVR in 2004; he is on coumadin and followed by coumadin clinic. Nuclear Stress test was negative in 2016 by Dr Debara Pickett. In early 2021 he was found to be in a. Fib/flutter at cardiology, asymptomatic. Had follow up ECHO 12/27/2019 showed severe/mod atrial enlargement with normal EF. Also incidentally noted ascending aortic aneurysm 4.4 cm, had 6 month CTA 06/2020 that showed 4.0 cm dilation and recommended annual follow up.   His blood pressure has been controlled at home (120s/70s), today their BP is BP: (!) 150/66 He does not workout. He denies chest pain, shortness of breath, dizziness.   He is on cholesterol medication (simvastatin 40 mg daily ) and denies myalgias. His cholesterol is not at goal for LDL <70. The cholesterol last visit was:   Lab Results  Component Value Date   CHOL 145 03/13/2022   HDL 43 03/13/2022   LDLCALC 79  03/13/2022   TRIG 136 03/13/2022   CHOLHDL 3.4 03/13/2022   He has been working on diet and exercise for T2DM. Was recently started on Mounjaro but was not seeing results and stopped taking.  Shares with me that insurance will not cover refill for Jardiance.  He is continuing to take Metformiin and Glipizide.  and denies foot ulcerations, hypoglycemia , increased appetite, nausea, paresthesia of the feet, polydipsia, polyuria, visual disturbances, vomiting and weight loss.  He checks a fasting glucose a few days a week, recently 160-165.   Has had hypoglycemia in the past, typically "has sweats" but none recently.  Last A1C in the office was:  Lab Results  Component Value Date   HGBA1C 7.3 (H) 03/13/2022   He has CKD I/II associated with T2DM monitored at this office:  Lab Results  Component Value Date   GFRNONAA 91 04/02/2021    Patient is not on Vitamin D supplement and very low at last check, admits has been forgetting to take, but has been taking 5000 IU 2-3 times a week:    Lab Results  Component Value Date   VD25OH 67 03/13/2022       Medication Review: Current Outpatient Medications on File Prior to Visit  Medication Sig Dispense Refill   aspirin 81 MG tablet Take 81 mg by mouth daily.     Cholecalciferol 125 MCG (5000 UT) CHEW Chew 5,000 Units by mouth daily.     CINNAMON PO Take 500 mg by mouth in the morning and at bedtime.      Cyanocobalamin (  B-12 SL) Place under the tongue daily.     enalapril (VASOTEC) 20 MG tablet Take 2 tablets (40 mg total) by mouth daily. 180 tablet 1   famotidine (PEPCID) 20 MG tablet TAKE 1 TABLET BY MOUTH TWICE A DAY WITH MEALS FOR ACIDE INDIGESTION 180 tablet 3   glipiZIDE (GLUCOTROL) 10 MG tablet TAKE 1 TABLET BY MOUTH 3 TIMES A DAY BEFORE MEALS FOR DIABETES 270 tablet 3   glucose blood (ONE TOUCH ULTRA TEST) test strip CHECK BLOOD SUGAR ONCE DAILY 100 each 3   metFORMIN (GLUCOPHAGE-XR) 500 MG 24 hr tablet TAKE 2 TABLETS TWICE A DAY WITH  MEALS FOR DIABETES 360 tablet 3   metoprolol succinate (TOPROL-XL) 50 MG 24 hr tablet TAKE 1.5 TABLETS (75 MG TOTAL) BY MOUTH DAILY. TAKE WITH OR IMMEDIATELY FOLLOWING A MEAL. 135 tablet 2   rosuvastatin (CRESTOR) 10 MG tablet TAKE 1 TABLET BY MOUTH EVERY DAY 90 tablet 3   warfarin (COUMADIN) 5 MG tablet TAKE 1/2 TO 1 TABLET BY MOUTH DAILY OR AS DIRECTED 90 tablet 0   cyanocobalamin (,VITAMIN B-12,) 1000 MCG/ML injection INJECT 1/2 ML EVERY MONTH 10 mL 2   JARDIANCE 25 MG TABS tablet TAKE 1 TABLET BY MOUTH DAILY FOR DIABETES (Patient not taking: Reported on 06/19/2022) 90 tablet 1   MOUNJARO 5 MG/0.5ML Pen INJECT 5 MG SUBCUTANEOUSLY WEEKLY (Patient not taking: Reported on 06/19/2022) 2 mL 3   No current facility-administered medications on file prior to visit.    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  Screening Tests Immunization History  Administered Date(s) Administered   Fluad Quad(high Dose 65+) 10/14/2020, 09/07/2021   Influenza Whole 09/01/2012   Influenza, High Dose Seasonal PF 09/27/2015, 10/14/2020   Influenza-Unspecified 08/25/2016, 08/24/2018, 09/13/2019   PFIZER Comirnaty(Gray Top)Covid-19 Tri-Sucrose Vaccine 12/18/2020   PFIZER(Purple Top)SARS-COV-2 Vaccination 03/18/2020, 04/13/2020   Pneumococcal Conjugate-13 12/18/2015   Pneumococcal Polysaccharide-23 09/01/2012    Tdap 11/25/2007    Preventative care: Last colonoscopy: 2011  Cologuard: 03/2022 Negative. Due 2026  Prior vaccinations: TD or Tdap: 2009 DUE patient declines Influenza: 09/2021  Pneumococcal: 2013 Prevnar13: 2017 Shingles/Zostavax: declines Covid 19: 3/3, pfizer   Names of Other Physician/Practitioners you currently use: 1. [facility] here for primary care 2. Dr. Delman Cheadle, eye doctor, last visit 2022, report requested, reports has upcoming, will see Dr. Tyrone Schimke for bil cataracts thisyear  3. Dr. Delphia Grates, dentist, last visit 2022, goes q57m DUE, looking for new dentist  Patient Care Team: MUnk Pinto MD as PCP - General (Internal Medicine) HDebara PickettKNadean Corwin MD as PCP - Cardiology (Cardiology) HDebara PickettKNadean Corwin MD as Consulting Physician (Cardiology) ELaurence Spates MD (Inactive) as Consulting Physician (Gastroenterology)  Surgical: He  has a past surgical history that includes Aortic valve replacement (07/18/2003); Hernia repair (09/11/2011); Aortic valve replacement (avr)/coronary artery bypass grafting (cabg) (07/18/2003); Cardiac catheterization (07/13/2003); TEE without cardioversion (07/18/2003); transthoracic echocardiogram (02/21/2013); and NM MYOCAR PERF WALL MOTION (02/12/2010). Family His family history includes Cancer in his brother and  sister; Heart attack in his father; Heart disease in his father; Stroke in his mother. Social history  He reports that he has never smoked. He has never used smokeless tobacco. He reports that he does not drink alcohol and does not use drugs.  MEDICARE WELLNESS OBJECTIVES: Physical activity: Exercise limited by: cardiac condition(s) Cardiac risk factors: Cardiac Risk Factors include: advanced age (>62mn, >>46women);diabetes mellitus;hypertension;male gender;obesity (BMI >30kg/m2) Depression/mood screen:      06/19/2022   12:41 PM  Depression screen PHQ 2/9  Decreased Interest 0  Down,  Depressed, Hopeless 0  PHQ - 2 Score 0    ADLs:     06/19/2022   12:41 PM 03/13/2022   12:39 AM  In your present state of health, do you have any difficulty performing the following activities:  Hearing? 0 0  Vision? 0 0  Difficulty concentrating or making decisions? 0 0  Walking or climbing stairs? 0 0  Dressing or bathing? 0 0  Doing errands, shopping? 0 0  Preparing Food and eating ? N   Using the Toilet? N   In the past six months, have you accidently leaked urine? N   Do you have problems with loss of bowel control? N   Managing your Medications? N   Managing your Finances? N   Housekeeping or managing your Housekeeping? N      Cognitive Testing  Alert? Yes  Normal Appearance?Yes  Oriented to person? Yes  Place? Yes   Time? Yes  Recall of three objects?  Yes  Can perform simple calculations? Yes  Displays appropriate judgment?Yes  Can read the correct time from a watch face?Yes  EOL planning: Does Patient Have a Medical Advance Directive?: Yes Type of Advance Directive: Living will Does patient want to make changes to medical advance directive?: No - Patient declined   Objective:   Today's Vitals   06/19/22 1132  BP: (!) 150/66  Pulse: 70  Temp: 97.7 F (36.5 C)  SpO2: 96%  Weight: 204 lb 9.6 oz (92.8 kg)  Height: '5\' 9"'$  (1.753 m)   Body mass index is 30.21 kg/m.  General appearance: alert, no distress, WD/WN, male HEENT: normocephalic, sclerae anicteric, TMs pearly, nares patent, no discharge or erythema, pharynx normal Oral cavity: MMM, no lesions Neck: supple, no lymphadenopathy, no thyromegaly, no masses Heart: Irregularly irregular, normal S1, S2, no murmurs Lungs: CTA bilaterally, no wheezes, rhonchi, or rales Abdomen: +bs, soft, non tender, non distended, no masses, no hepatomegaly, no splenomegaly Musculoskeletal: nontender, no swelling, no obvious deformity Extremities: no edema, no cyanosis, no clubbing Pulses: 2+ symmetric, upper and lower  extremities, normal cap refill Neurological: alert, oriented x 3, CN2-12 intact, strength normal upper extremities and lower extremities, sensation normal throughout, DTRs 2+ throughout, no cerebellar signs, gait normal Psychiatric: normal affect, behavior normal, pleasant   Medicare Attestation I have personally reviewed: The patient's medical and social history Their use of alcohol, tobacco or illicit drugs Their current medications and supplements The patient's functional ability including ADLs,fall risks, home safety risks, cognitive, and hearing and visual impairment Diet and physical activities Evidence for depression or mood disorders  The patient's weight, height, BMI, and visual acuity have been recorded in the chart.  I have made referrals, counseling, and provided education to the patient based on review of the above and I have provided the patient with a written personalized care plan for preventive services.     TDarrol Jump NP   06/19/2022

## 2022-06-20 LAB — HEMOGLOBIN A1C
Hgb A1c MFr Bld: 7.8 %{Hb} — ABNORMAL HIGH
Mean Plasma Glucose: 177 mg/dL
eAG (mmol/L): 9.8 mmol/L

## 2022-06-20 LAB — CBC WITH DIFFERENTIAL/PLATELET
Absolute Monocytes: 504 {cells}/uL (ref 200–950)
Basophils Absolute: 42 {cells}/uL (ref 0–200)
Basophils Relative: 0.6 %
Eosinophils Absolute: 133 {cells}/uL (ref 15–500)
Eosinophils Relative: 1.9 %
HCT: 44.8 % (ref 38.5–50.0)
Hemoglobin: 15.7 g/dL (ref 13.2–17.1)
Lymphs Abs: 1274 {cells}/uL (ref 850–3900)
MCH: 33.7 pg — ABNORMAL HIGH (ref 27.0–33.0)
MCHC: 35 g/dL (ref 32.0–36.0)
MCV: 96.1 fL (ref 80.0–100.0)
MPV: 11.4 fL (ref 7.5–12.5)
Monocytes Relative: 7.2 %
Neutro Abs: 5047 {cells}/uL (ref 1500–7800)
Neutrophils Relative %: 72.1 %
Platelets: 157 10*3/uL (ref 140–400)
RBC: 4.66 Million/uL (ref 4.20–5.80)
RDW: 12.2 % (ref 11.0–15.0)
Total Lymphocyte: 18.2 %
WBC: 7 10*3/uL (ref 3.8–10.8)

## 2022-06-20 LAB — COMPLETE METABOLIC PANEL WITHOUT GFR
AG Ratio: 1.9 (calc) (ref 1.0–2.5)
ALT: 15 U/L (ref 9–46)
AST: 12 U/L (ref 10–35)
Albumin: 4.2 g/dL (ref 3.6–5.1)
Alkaline phosphatase (APISO): 89 U/L (ref 35–144)
BUN/Creatinine Ratio: 21 (calc) (ref 6–22)
BUN: 14 mg/dL (ref 7–25)
CO2: 28 mmol/L (ref 20–32)
Calcium: 9.2 mg/dL (ref 8.6–10.3)
Chloride: 101 mmol/L (ref 98–110)
Creat: 0.66 mg/dL — ABNORMAL LOW (ref 0.70–1.28)
Globulin: 2.2 g/dL (ref 1.9–3.7)
Glucose, Bld: 327 mg/dL — ABNORMAL HIGH (ref 65–99)
Potassium: 4.8 mmol/L (ref 3.5–5.3)
Sodium: 136 mmol/L (ref 135–146)
Total Bilirubin: 1 mg/dL (ref 0.2–1.2)
Total Protein: 6.4 g/dL (ref 6.1–8.1)
eGFR: 95 mL/min/{1.73_m2}

## 2022-06-20 LAB — LIPID PANEL
Cholesterol: 134 mg/dL
HDL: 40 mg/dL
LDL Cholesterol (Calc): 80 mg/dL
Non-HDL Cholesterol (Calc): 94 mg/dL
Total CHOL/HDL Ratio: 3.4 (calc)
Triglycerides: 63 mg/dL

## 2022-06-20 LAB — VITAMIN D 25 HYDROXY (VIT D DEFICIENCY, FRACTURES): Vit D, 25-Hydroxy: 78 ng/mL (ref 30–100)

## 2022-06-24 ENCOUNTER — Telehealth: Payer: Self-pay

## 2022-06-24 ENCOUNTER — Ambulatory Visit (INDEPENDENT_AMBULATORY_CARE_PROVIDER_SITE_OTHER): Payer: Medicare Other | Admitting: Cardiology

## 2022-06-24 ENCOUNTER — Inpatient Hospital Stay: Admission: RE | Admit: 2022-06-24 | Payer: PPO | Source: Ambulatory Visit

## 2022-06-24 DIAGNOSIS — Z952 Presence of prosthetic heart valve: Secondary | ICD-10-CM

## 2022-06-24 DIAGNOSIS — Z7901 Long term (current) use of anticoagulants: Secondary | ICD-10-CM

## 2022-06-24 LAB — POCT INR: INR: 2.7 (ref 2.0–3.0)

## 2022-06-24 NOTE — Telephone Encounter (Signed)
Lpm to check INR 

## 2022-06-30 ENCOUNTER — Ambulatory Visit (HOSPITAL_COMMUNITY)
Admission: RE | Admit: 2022-06-30 | Discharge: 2022-06-30 | Disposition: A | Discharge status: Home / Self Care | Payer: Medicare Other | Source: Ambulatory Visit | Attending: Internal Medicine | Admitting: Internal Medicine

## 2022-06-30 DIAGNOSIS — I712 Thoracic aortic aneurysm, without rupture, unspecified: Secondary | ICD-10-CM | POA: Insufficient documentation

## 2022-06-30 MED ORDER — IOHEXOL 350 MG/ML SOLN
75.0000 mL | Freq: Once | INTRAVENOUS | Status: AC | PRN
  Administered 2022-06-30: 75 mL via INTRAVENOUS

## 2022-07-01 ENCOUNTER — Encounter: Payer: Self-pay | Admitting: Internal Medicine

## 2022-07-01 ENCOUNTER — Other Ambulatory Visit: Payer: Self-pay | Admitting: Internal Medicine

## 2022-07-01 DIAGNOSIS — I712 Thoracic aortic aneurysm, without rupture, unspecified: Secondary | ICD-10-CM

## 2022-07-08 ENCOUNTER — Telehealth: Payer: Self-pay

## 2022-07-08 NOTE — Telephone Encounter (Signed)
Lpm to check INR 

## 2022-07-09 ENCOUNTER — Telehealth: Payer: Self-pay | Admitting: *Deleted

## 2022-07-09 ENCOUNTER — Ambulatory Visit (INDEPENDENT_AMBULATORY_CARE_PROVIDER_SITE_OTHER): Payer: Medicare Other | Admitting: *Deleted

## 2022-07-09 DIAGNOSIS — Z952 Presence of prosthetic heart valve: Secondary | ICD-10-CM

## 2022-07-09 DIAGNOSIS — Z7901 Long term (current) use of anticoagulants: Secondary | ICD-10-CM

## 2022-07-09 LAB — POCT INR: INR: 2.8 (ref 2.0–3.0)

## 2022-07-09 NOTE — Patient Instructions (Signed)
Description   Spoke with pt and advised to continue taking warfarin 1/2 tablet daily except for 1 tablet on Mondays and Fridays. Recheck INR in 2 weeks (self tester). [facility] [phone]     

## 2022-07-09 NOTE — Telephone Encounter (Signed)
Called pt since he is overdue for INR self monitoring. Pt states he will perform his INR test today.

## 2022-07-17 DIAGNOSIS — Z961 Presence of intraocular lens: Secondary | ICD-10-CM | POA: Diagnosis not present

## 2022-07-17 DIAGNOSIS — H2511 Age-related nuclear cataract, right eye: Secondary | ICD-10-CM | POA: Diagnosis not present

## 2022-07-17 DIAGNOSIS — H524 Presbyopia: Secondary | ICD-10-CM | POA: Diagnosis not present

## 2022-07-17 DIAGNOSIS — H25011 Cortical age-related cataract, right eye: Secondary | ICD-10-CM | POA: Diagnosis not present

## 2022-07-18 ENCOUNTER — Other Ambulatory Visit: Payer: Self-pay | Admitting: Nurse Practitioner

## 2022-07-18 ENCOUNTER — Telehealth: Payer: Self-pay | Admitting: Nurse Practitioner

## 2022-07-18 MED ORDER — RYBELSUS 7 MG PO TABS: 7.0000 mg | ORAL_TABLET | Freq: Every day | ORAL | 2 refills | Status: DC

## 2022-07-18 NOTE — Telephone Encounter (Signed)
Pt was given a sample of Rybelsus 3 mg, wanting a prescription for the next dose up to be sent to [facility] on [address].

## 2022-07-24 ENCOUNTER — Telehealth: Payer: Self-pay | Admitting: Nurse Practitioner

## 2022-07-24 ENCOUNTER — Ambulatory Visit (INDEPENDENT_AMBULATORY_CARE_PROVIDER_SITE_OTHER): Payer: Medicare Other

## 2022-07-24 DIAGNOSIS — Z7901 Long term (current) use of anticoagulants: Secondary | ICD-10-CM | POA: Diagnosis not present

## 2022-07-24 DIAGNOSIS — Z952 Presence of prosthetic heart valve: Secondary | ICD-10-CM | POA: Diagnosis not present

## 2022-07-24 DIAGNOSIS — I480 Paroxysmal atrial fibrillation: Secondary | ICD-10-CM | POA: Diagnosis not present

## 2022-07-24 LAB — POCT INR: INR: 2 (ref 2.0–3.0)

## 2022-07-24 NOTE — Patient Instructions (Signed)
Description   Spoke with pt and advised to take 1 tablet today and then continue taking warfarin 1/2 tablet daily except for 1 tablet on Mondays and Fridays. Recheck INR in 2 weeks (self tester). [facility] [phone]

## 2022-07-24 NOTE — Telephone Encounter (Signed)
Will you re-send rx for Rybelsus? Patient went by the pharmacy Monday to pick it up and they are telling him they never received it, even though epic shows it was sent in on August 25th

## 2022-07-25 ENCOUNTER — Other Ambulatory Visit: Payer: Self-pay | Admitting: Nurse Practitioner

## 2022-07-25 MED ORDER — RYBELSUS 7 MG PO TABS: 7.0000 mg | ORAL_TABLET | Freq: Every day | ORAL | 2 refills | Status: DC

## 2022-08-08 ENCOUNTER — Ambulatory Visit (INDEPENDENT_AMBULATORY_CARE_PROVIDER_SITE_OTHER): Payer: Medicare Other

## 2022-08-08 DIAGNOSIS — Z7901 Long term (current) use of anticoagulants: Secondary | ICD-10-CM | POA: Diagnosis not present

## 2022-08-08 DIAGNOSIS — Z952 Presence of prosthetic heart valve: Secondary | ICD-10-CM | POA: Diagnosis not present

## 2022-08-08 LAB — POCT INR: INR: 3.1 — AB (ref 2.0–3.0)

## 2022-08-08 NOTE — Patient Instructions (Signed)
Description   Spoke with pt and advised to only take 1/2 tablet today and then continue taking warfarin 1/2 tablet daily except for 1 tablet on Mondays and Fridays.  Recheck INR in 2 weeks (self tester). [facility] Clinic [phone]

## 2022-08-22 ENCOUNTER — Telehealth: Payer: Self-pay

## 2022-08-22 ENCOUNTER — Ambulatory Visit (INDEPENDENT_AMBULATORY_CARE_PROVIDER_SITE_OTHER): Payer: Medicare Other | Admitting: Cardiovascular Disease

## 2022-08-22 DIAGNOSIS — Z5181 Encounter for therapeutic drug level monitoring: Secondary | ICD-10-CM | POA: Diagnosis not present

## 2022-08-22 LAB — POCT INR: INR: 2.7 (ref 2.0–3.0)

## 2022-08-22 NOTE — Telephone Encounter (Signed)
Patient states he is returning Michael's call.

## 2022-09-05 ENCOUNTER — Ambulatory Visit (INDEPENDENT_AMBULATORY_CARE_PROVIDER_SITE_OTHER): Payer: Medicare Other | Admitting: *Deleted

## 2022-09-05 DIAGNOSIS — Z952 Presence of prosthetic heart valve: Secondary | ICD-10-CM

## 2022-09-05 DIAGNOSIS — Z7901 Long term (current) use of anticoagulants: Secondary | ICD-10-CM | POA: Diagnosis not present

## 2022-09-05 LAB — POCT INR: INR: 3 (ref 2.0–3.0)

## 2022-09-09 DIAGNOSIS — H25811 Combined forms of age-related cataract, right eye: Secondary | ICD-10-CM | POA: Diagnosis not present

## 2022-09-09 DIAGNOSIS — H2511 Age-related nuclear cataract, right eye: Secondary | ICD-10-CM | POA: Diagnosis not present

## 2022-09-09 DIAGNOSIS — H25011 Cortical age-related cataract, right eye: Secondary | ICD-10-CM | POA: Diagnosis not present

## 2022-09-09 DIAGNOSIS — H269 Unspecified cataract: Secondary | ICD-10-CM | POA: Diagnosis not present

## 2022-09-18 DIAGNOSIS — H26492 Other secondary cataract, left eye: Secondary | ICD-10-CM | POA: Diagnosis not present

## 2022-09-22 ENCOUNTER — Encounter: Payer: Self-pay | Admitting: Internal Medicine

## 2022-09-22 ENCOUNTER — Ambulatory Visit (INDEPENDENT_AMBULATORY_CARE_PROVIDER_SITE_OTHER): Payer: Medicare Other | Admitting: *Deleted

## 2022-09-22 DIAGNOSIS — Z7901 Long term (current) use of anticoagulants: Secondary | ICD-10-CM

## 2022-09-22 DIAGNOSIS — I480 Paroxysmal atrial fibrillation: Secondary | ICD-10-CM | POA: Diagnosis not present

## 2022-09-22 DIAGNOSIS — Z952 Presence of prosthetic heart valve: Secondary | ICD-10-CM | POA: Diagnosis not present

## 2022-09-22 LAB — POCT INR: INR: 2.8 (ref 2.0–3.0)

## 2022-09-22 NOTE — Patient Instructions (Signed)
Description   Spoke with pt and advised to continue taking warfarin 1/2 tablet daily except for 1 tablet on Mondays and Fridays. Recheck INR in 2 weeks (self tester). [facility] [phone]

## 2022-09-22 NOTE — Patient Instructions (Signed)

## 2022-09-22 NOTE — Progress Notes (Signed)
Future Appointments  Date Time Provider Department  09/23/2022                       6 mo ov 10:30 AM Unk Pinto, MD [facility]  10/21/2022  3:15 PM Debara Pickett Nadean Corwin, MD [facility]  03/17/2023                         cpe  2:00 PM Unk Pinto, MD [facility]  06/22/2023                        wellness 11:00 AM Darrol Jump, NP [facility]    History of Present Illness:      This very nice 81 y.o.  Billy Reilly  ( widowed Oct 2022)  presents for 6 month follow up with HTN, ASHD, St Jude AoVR, cAfib, HLD, T2_NIDDM  and Vitamin D Deficiency.  CTA in 2021 showed Aortic Atherosclerosis.        Patient is treated for HTN  (1994) & BP has been controlled at home. Today's   . In 2004, patient had a CABG &  [place] and has been on Coumadin since.  In 2016 , he had a Negative Myoview.  Patient has had no complaints of any cardiac type chest pain, palpitations, dyspnea / orthopnea / PND, dizziness, claudication, or dependent edema.       Hyperlipidemia is controlled with diet & Rosuvastatin.  Patient denies myalgias or other med SE's. 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  Last A1c was not at goal :   Lab Results  Component Value Date   HGBA1C 7.8 (H) 06/19/2022        Further, the patient also has history of Vitamin D Deficiency ("28" /2012) and supplements vitamin D . Last vitamin D was at goal :   Lab Results  Component Value Date   VD25OH 78 06/19/2022     Current Outpatient Medications on File Prior to Visit  Medication Sig   aspirin 81 MG tablet Take daily.   Vitamin 5,000 units   daily.   CINNAMON  500 mg  Take in the morning and at bedtime.     clotrimazole-betamethasone  cream Apply to rash 2 to 3 x /day   VITAMIN B-12 1000 MCG/ML injec INJECT 1/2 ML EVERY MONTH   Vitamin B-12 SL Place under the tongue daily.   enalapril 20 MG tablet Take 2 tablets  daily.   famotidine  20 MG tablet TAKE 1 TABLET  TWICE A DAY WITH MEALS    glipiZIDE 10 MG TAKE 1 TABLET  3 TIMES A DAY    metFORMIN -XR 500 MG  TAKE 2 TABLETS TWICE A DAY WITH MEALS    metoprolol succinate -XL) 50 MG  TAKE 1.5 TABLETS (75 MG TOTAL) DAILY.    rosuvastatin 10 MG tablet TAKE 1 TABLET EVERY DAY   Semaglutide (RYBELSUS) 7 MG  Take 7 mg daily.   warfarin (COUMADIN) 5 MG  TAKE 1/2 TO 1 TABLET DAILY OR AS DIRECTED     No Known Allergies  PMHx:   Past Medical History:  Diagnosis Date   Anemia    AS (aortic stenosis)    valve replacement-st. jude mechanical valve   ASHD (arteriosclerotic heart disease)    CAD (coronary artery disease), native coronary artery    CABG 07/18/2003   Cancer (HCC)    prostate   GERD (gastroesophageal reflux disease)    History of nuclear stress test 02/12/10   EF 67%, normal perfusion   Hyperlipidemia    Hypertension    Morbid obesity ([place])    Type II  diabetes mellitus    Umbilical hernia    Vitamin B12 deficiency    Vitamin D deficiency      Immunization History  Administered Date(s) Administered   Fluad Quad(high Dose ) 10/14/2020, 09/07/2021   Influenza Whole 09/01/2012   Influenza, High Dose  09/27/2015, 10/14/2020   Influenza 08/25/2016, 08/24/2018, 09/13/2019   PFIZER Tri-Sucrose Vacc 12/18/2020   PFIZER-SARS-COV-2 Vacc 03/18/2020, 04/13/2020   Pneumococcal - 13 12/18/2015   Pneumococcal - 23 09/01/2012   Tdap 11/25/2007     Past Surgical History:  Procedure Laterality Date   AORTIC VALVE REPLACEMENT  07/18/2003   65m ATS-AP valve   AORTIC VALVE REPLACEMENT (AVR)/CORONARY ARTERY BYPASS GRAFTING (CABG)  07/18/2003   AVR; bypass x 2-SVG to first diag, SVG to posterior descending   CARDIAC CATHETERIZATION   07/13/2003   AS-aortic valve gradient 516mg, valve area 1.1; obstruction of ostium of posterior lateral branch with obstruction in the ostium of first diagnonal   HERNIA REPAIR  1061/60/7371 RIH, umbilical   NM MYOCAR PERF WALL MOTION  02/12/2010   protocol:Bruce, EF 67%, exercise cap 7 METS, low risk scan   TEE WITHOUT CARDIOVERSION  07/18/2003   no evidence of PE, Aortic valve heavily calcified,    TRANSTHORACIC ECHOCARDIOGRAM  02/21/2013   EF60-65%, Septal motion showed paradox. atrium moderately dilated    FHx:    Reviewed / unchanged  SHx:    Reviewed / unchanged   Systems Review:  Constitutional: Denies fever, chills, wt changes, headaches, insomnia, fatigue, night sweats, change in appetite. Eyes: Denies redness, blurred vision, diplopia, discharge, itchy, watery eyes.  ENT: Denies discharge, congestion, post nasal drip, epistaxis, sore throat, earache, hearing loss, dental pain, tinnitus, vertigo, sinus pain, snoring.  CV: Denies chest pain, palpitations, irregular heartbeat, syncope, dyspnea, diaphoresis, orthopnea, PND, claudication or edema. Respiratory: denies cough, dyspnea, DOE, pleurisy, hoarseness, laryngitis, wheezing.  Gastrointestinal: Denies dysphagia, odynophagia, heartburn, reflux, water brash, abdominal pain or cramps, nausea, vomiting, bloating, diarrhea, constipation, hematemesis, melena, hematochezia  or hemorrhoids. Genitourinary: Denies dysuria, frequency, urgency, nocturia, hesitancy, discharge, hematuria or flank pain. Musculoskeletal: Denies arthralgias, myalgias, stiffness, jt. swelling, pain, limping or strain/sprain.  Skin: Denies pruritus, rash, hives, warts, acne, eczema or change in skin lesion(s). Neuro: No weakness, tremor, incoordination, spasms, paresthesia or pain. Psychiatric: Denies confusion, memory loss or sensory loss. Endo: Denies change in weight, skin or hair change.  Heme/Lymph: No excessive bleeding, bruising or enlarged lymph  nodes.  Physical Exam  There were no vitals taken for this visit.  Appears  overnourished, well groomed  and in no distress.  Eyes: PERRLA, EOMs, conjunctiva no swelling or erythema. Sinuses: No frontal/maxillary tenderness ENT/Mouth: EAC's clear, TM's nl w/o erythema, bulging. Nares clear w/o erythema, swelling, exudates. Oropharynx clear without erythema or exudates. Oral hygiene is good. Tongue normal, non obstructing. Hearing intact.  Neck: Supple. Thyroid not palpable. Car 2+/2+ without bruits, nodes or JVD. Chest: Respirations nl with BS clear &  equal w/o rales, rhonchi, wheezing or stridor.  Cor: prosthetic valve clicks   w/regular rate and rhythm without sig. murmurs, gallops, clicks or rubs. Peripheral pulses normal and equal  without edema.  Abdomen: Soft & bowel sounds normal. Non-tender w/o guarding, rebound, hernias, masses or organomegaly.  Lymphatics: Unremarkable.  Musculoskeletal: Full ROM all peripheral extremities, joint stability, 5/5 strength and normal gait.  Skin: Warm, dry without exposed rashes, lesions or ecchymosis apparent.  Neuro: Cranial nerves intact, reflexes equal bilaterally. Sensory-motor testing grossly intact. Tendon reflexes grossly intact.  Pysch: Alert & oriented x 3.  Insight and judgement nl & appropriate. No ideations.  Assessment and Plan:  1. Essential hypertension  - Continue medication, monitor blood pressure at home.  - Continue DASH diet.  Reminder to go to the ER if any CP,  SOB, nausea, dizziness, severe HA, changes vision/speech.   - CBC with Differential/Platelet - COMPLETE METABOLIC PANEL WITH GFR - Magnesium - TSH  2. Hyperlipidemia associated with type 2 diabetes mellitus ([place])  - Continue diet/meds, exercise,& lifestyle modifications.  - Continue monitor periodic cholesterol/liver & renal functions    - Lipid panel - TSH  3. Type 2 diabetes mellitus with stage 1 chronic kidney  disease, without long-term current use  of insulin (HCC)  - Continue diet, exercise  - Lifestyle modifications.  - Monitor appropriate labs   - Hemoglobin A1c - Insulin, random  4. Vitamin D deficiency  - Continue supplementation   - VITAMIN D 25 Hydroxy  5. Aortic atherosclerosis ([place]) by CTA 06/2020  - Lipid panel  6. Chronic atrial fibrillation (HCC)  - TSH  7. H/O mechanical aortic valve replacement   8. Medication management  - CBC with Differential/Platelet - COMPLETE METABOLIC PANEL WITH GFR - Magnesium - Lipid panel - TSH - Hemoglobin A1c - Insulin, random - VITAMIN D 25 Hydroxy           Discussed  regular exercise, BP monitoring, weight control to achieve/maintain BMI less than 25 and discussed med and SE's. Recommended labs to assess /monitor clinical status .  I discussed the assessment and treatment plan with the patient. The patient was provided an opportunity to ask questions and all were answered. The patient agreed with the plan and demonstrated an understanding of the instructions.  I provided over 30 minutes of exam, counseling, chart review and  complex critical decision making.        The patient was advised to call back or seek an in-person evaluation if the symptoms worsen or if the condition fails to improve as anticipated.   Kirtland Bouchard, MD

## 2022-09-23 ENCOUNTER — Encounter: Payer: Self-pay | Admitting: Internal Medicine

## 2022-09-23 ENCOUNTER — Ambulatory Visit (INDEPENDENT_AMBULATORY_CARE_PROVIDER_SITE_OTHER): Payer: Medicare Other | Admitting: Internal Medicine

## 2022-09-23 VITALS — BP 140/70 | HR 63 | Temp 97.5°F | Resp 17 | Ht 69.0 in | Wt 200.0 lb

## 2022-09-23 DIAGNOSIS — Z952 Presence of prosthetic heart valve: Secondary | ICD-10-CM

## 2022-09-23 DIAGNOSIS — E785 Hyperlipidemia, unspecified: Secondary | ICD-10-CM | POA: Diagnosis not present

## 2022-09-23 DIAGNOSIS — E1122 Type 2 diabetes mellitus with diabetic chronic kidney disease: Secondary | ICD-10-CM | POA: Diagnosis not present

## 2022-09-23 DIAGNOSIS — Z23 Encounter for immunization: Secondary | ICD-10-CM

## 2022-09-23 DIAGNOSIS — Z794 Long term (current) use of insulin: Secondary | ICD-10-CM | POA: Diagnosis not present

## 2022-09-23 DIAGNOSIS — Z79899 Other long term (current) drug therapy: Secondary | ICD-10-CM | POA: Diagnosis not present

## 2022-09-23 DIAGNOSIS — N181 Chronic kidney disease, stage 1: Secondary | ICD-10-CM | POA: Diagnosis not present

## 2022-09-23 DIAGNOSIS — E559 Vitamin D deficiency, unspecified: Secondary | ICD-10-CM

## 2022-09-23 DIAGNOSIS — I10 Essential (primary) hypertension: Secondary | ICD-10-CM | POA: Diagnosis not present

## 2022-09-23 DIAGNOSIS — I700 Atherosclerosis of aorta: Secondary | ICD-10-CM

## 2022-09-23 DIAGNOSIS — E1169 Type 2 diabetes mellitus with other specified complication: Secondary | ICD-10-CM | POA: Diagnosis not present

## 2022-09-23 DIAGNOSIS — I482 Chronic atrial fibrillation, unspecified: Secondary | ICD-10-CM

## 2022-09-23 MED ORDER — TRULICITY 0.75 MG/0.5ML ~~LOC~~ SOAJ: 0.7500 mg | SUBCUTANEOUS | 0 refills | Status: DC

## 2022-09-24 LAB — COMPLETE METABOLIC PANEL WITHOUT GFR
AG Ratio: 1.6 (calc) (ref 1.0–2.5)
ALT: 21 U/L (ref 9–46)
AST: 18 U/L (ref 10–35)
Albumin: 4.1 g/dL (ref 3.6–5.1)
Alkaline phosphatase (APISO): 102 U/L (ref 35–144)
BUN: 13 mg/dL (ref 7–25)
CO2: 30 mmol/L (ref 20–32)
Calcium: 8.9 mg/dL (ref 8.6–10.3)
Chloride: 100 mmol/L (ref 98–110)
Creat: 0.79 mg/dL (ref 0.70–1.22)
Globulin: 2.6 g/dL (ref 1.9–3.7)
Glucose, Bld: 343 mg/dL — ABNORMAL HIGH (ref 65–99)
Potassium: 4.7 mmol/L (ref 3.5–5.3)
Sodium: 138 mmol/L (ref 135–146)
Total Bilirubin: 1.3 mg/dL — ABNORMAL HIGH (ref 0.2–1.2)
Total Protein: 6.7 g/dL (ref 6.1–8.1)
eGFR: 90 mL/min/{1.73_m2}

## 2022-09-24 LAB — CBC WITH DIFFERENTIAL/PLATELET
Absolute Monocytes: 490 {cells}/uL (ref 200–950)
Basophils Absolute: 50 {cells}/uL (ref 0–200)
Basophils Relative: 0.7 %
Eosinophils Absolute: 149 {cells}/uL (ref 15–500)
Eosinophils Relative: 2.1 %
HCT: 48.9 % (ref 38.5–50.0)
Hemoglobin: 17.1 g/dL (ref 13.2–17.1)
Lymphs Abs: 1108 {cells}/uL (ref 850–3900)
MCH: 33.7 pg — ABNORMAL HIGH (ref 27.0–33.0)
MCHC: 35 g/dL (ref 32.0–36.0)
MCV: 96.3 fL (ref 80.0–100.0)
MPV: 12.5 fL (ref 7.5–12.5)
Monocytes Relative: 6.9 %
Neutro Abs: 5304 {cells}/uL (ref 1500–7800)
Neutrophils Relative %: 74.7 %
Platelets: 135 10*3/uL — ABNORMAL LOW (ref 140–400)
RBC: 5.08 Million/uL (ref 4.20–5.80)
RDW: 12.3 % (ref 11.0–15.0)
Total Lymphocyte: 15.6 %
WBC: 7.1 10*3/uL (ref 3.8–10.8)

## 2022-09-24 LAB — VITAMIN D 25 HYDROXY (VIT D DEFICIENCY, FRACTURES): Vit D, 25-Hydroxy: 77 ng/mL (ref 30–100)

## 2022-09-24 LAB — HEMOGLOBIN A1C
Hgb A1c MFr Bld: 10.3 %{Hb} — ABNORMAL HIGH
Mean Plasma Glucose: 249 mg/dL
eAG (mmol/L): 13.8 mmol/L

## 2022-09-24 LAB — LIPID PANEL
Cholesterol: 139 mg/dL
HDL: 42 mg/dL
LDL Cholesterol (Calc): 79 mg/dL
Non-HDL Cholesterol (Calc): 97 mg/dL
Total CHOL/HDL Ratio: 3.3 (calc)
Triglycerides: 99 mg/dL

## 2022-09-24 LAB — TSH: TSH: 1.85 m[IU]/L (ref 0.40–4.50)

## 2022-09-24 LAB — INSULIN, RANDOM: Insulin: 29 u[IU]/mL — ABNORMAL HIGH

## 2022-09-24 LAB — MAGNESIUM: Magnesium: 1.8 mg/dL (ref 1.5–2.5)

## 2022-09-24 NOTE — Progress Notes (Signed)
<><><><><><><><><><><><><><><><><><><><><><><><><><><><><><><><><> <><><><><><><><><><><><><><><><><><><><><><><><><><><><><><><><><>  -   Glucose = 343 mg%  is very elevated  !  And   - A1c = 10.3% - Way Way too high !  Need to work harder with diet & lose weight  !   Being diabetic has a  300% increased risk for heart attack,                                           stroke, cancer, and alzheimer- type vascular dementia.   It is very important that you work harder with diet by  avoiding all foods that are white except chicken,   fish & calliflower.  - Avoid white rice  (brown & wild rice is OK),   - Avoid white potatoes  (sweet potatoes in moderation is OK),   White bread or wheat bread or anything made out of   white flour like bagels, donuts, rolls, buns, biscuits, cakes,  - pastries, cookies, pizza crust, and pasta                                                            (made from white flour & egg whites)   - vegetarian pasta or spinach or wheat pasta is OK.  - Multigrain breads like Arnold's, Pepperidge Farm or                                                 multigrain sandwich thins or high fiber breads like   [place] bread or "Dave's Killer" breads that are 4 to 5 grams fiber per slice !  are best.    Diet, exercise and weight loss can reverse and cure diabetes in the early stages.   <><><><><><><><><><><><><><><><><><><><><><><><><><><><><><><><><>  - Total Chol = 139   & LDL Chol =     Both    Excellent   - Very low risk for Heart Attack  / Stroke  <><><><><><><><><><><><><><><><><><><><><><><><><><><><><><><><><>  -  Vitamin D = 77 - Excellent  !   Please keep dose same  <><><><><><><><><><><><><><><><><><><><><><><><><><><><><><><><><>  -  All Else - CBC - Kidneys - Electrolytes - Liver - Magnesium & Thyroid    - all  Normal /  OK <><><><><><><><><><><><><><><><><><><><><><><><><><><><><><><><><> <><><><><><><><><><><><><><><><><><><><><><><><><><><><><><><><><>

## 2022-09-26 ENCOUNTER — Other Ambulatory Visit: Payer: Self-pay | Admitting: Internal Medicine

## 2022-09-26 DIAGNOSIS — E1122 Type 2 diabetes mellitus with diabetic chronic kidney disease: Secondary | ICD-10-CM

## 2022-09-26 MED ORDER — SEMAGLUTIDE 3 MG PO TABS: ORAL_TABLET | ORAL | 0 refills | Status: DC

## 2022-10-01 ENCOUNTER — Other Ambulatory Visit: Payer: Self-pay | Admitting: Internal Medicine

## 2022-10-07 NOTE — Progress Notes (Signed)
Patient is aware of lab results and instructions. -e welch

## 2022-10-08 ENCOUNTER — Ambulatory Visit (INDEPENDENT_AMBULATORY_CARE_PROVIDER_SITE_OTHER): Payer: Medicare Other

## 2022-10-08 DIAGNOSIS — Z7901 Long term (current) use of anticoagulants: Secondary | ICD-10-CM | POA: Diagnosis not present

## 2022-10-08 DIAGNOSIS — Z952 Presence of prosthetic heart valve: Secondary | ICD-10-CM | POA: Diagnosis not present

## 2022-10-08 LAB — POCT INR: INR: 3.6 — AB (ref 2.0–3.0)

## 2022-10-08 NOTE — Patient Instructions (Signed)
Description   Spoke with pt and advised to hold today's dose and then continue taking warfarin 1/2 tablet daily except for 1 tablet on Mondays and Fridays.  Recheck INR in 2 weeks (self tester).  [facility] [phone]

## 2022-10-21 ENCOUNTER — Encounter: Payer: Self-pay | Admitting: Internal Medicine

## 2022-10-21 ENCOUNTER — Ambulatory Visit: Payer: Medicare Other | Attending: Internal Medicine | Admitting: Internal Medicine

## 2022-10-21 ENCOUNTER — Ambulatory Visit (INDEPENDENT_AMBULATORY_CARE_PROVIDER_SITE_OTHER): Payer: Medicare Other | Admitting: *Deleted

## 2022-10-21 VITALS — BP 154/82 | HR 65 | Ht 69.0 in | Wt 213.0 lb

## 2022-10-21 DIAGNOSIS — E785 Hyperlipidemia, unspecified: Secondary | ICD-10-CM | POA: Diagnosis not present

## 2022-10-21 DIAGNOSIS — Z7901 Long term (current) use of anticoagulants: Secondary | ICD-10-CM

## 2022-10-21 DIAGNOSIS — I712 Thoracic aortic aneurysm, without rupture, unspecified: Secondary | ICD-10-CM

## 2022-10-21 DIAGNOSIS — Z952 Presence of prosthetic heart valve: Secondary | ICD-10-CM

## 2022-10-21 DIAGNOSIS — I4821 Permanent atrial fibrillation: Secondary | ICD-10-CM

## 2022-10-21 LAB — POCT INR: INR: 3.9 — AB (ref 2.0–3.0)

## 2022-10-21 NOTE — Patient Instructions (Signed)
Medication Instructions:  NO CHANGES  *If you need a refill on your cardiac medications before your next appointment, please call your pharmacy*  Testing/Procedures: Your physician has requested that you have an echocardiogram. Echocardiography is a painless test that uses sound waves to create images of your heart. It provides your doctor with information about the size and shape of your heart and how well your heart's chambers and valves are working. This procedure takes approximately one hour. There are no restrictions for this procedure. Please do NOT wear cologne, perfume, aftershave, or lotions (deodorant is allowed). Please arrive 15 minutes prior to your appointment time. This is due in late May 2024   Follow-Up: At [facility], you and your health needs are our priority.  As part of our continuing mission to provide you with exceptional heart care, we have created designated Provider Care Teams.  These Care Teams include your primary Cardiologist (physician) and Advanced Practice Providers (APPs -  Physician Assistants and Nurse Practitioners) who all work together to provide you with the care you need, when you need it.  We recommend signing up for the patient portal called "MyChart".  Sign up information is provided on this After Visit Summary.  MyChart is used to connect with patients for Virtual Visits (Telemedicine).  Patients are able to view lab/test results, encounter notes, upcoming appointments, etc.  Non-urgent messages can be sent to your provider as well.   To learn more about what you can do with MyChart, go to [email or web address].    Your next appointment:     6 months with Dr. Debara Pickett

## 2022-10-21 NOTE — Progress Notes (Addendum)
OFFICE NOTE  Chief Complaint:  No complaints  Primary Care Physician: Unk Pinto, MD  HPI:  Billy Reilly is a 81 year old gentleman, previously followed by Dr. Rex Kras, with a history of coronary disease. In 2004, he had CABG and replacement of his aortic valve with a mechanical prosthesis. He had 2-vessel bypass at the time with saphenous vein graft to the first diagonal and saphenous vein graft to the posterior descending by Dr. Prescott Gum, and replacement of the aortic valve with a 22 mm ATS AP valve. He has done well with valve replacement to this point without any recurrent angina, shortness of breath, palpitations, presyncope, or syncopal symptoms. INR checked today in the office is 2.6 and it has been stable on Coumadin without dose adjustments for at least a year. 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 Recently he was started on Farxiga, which is resulted in excellent weight loss as well as control of his blood sugars.  Overall he feels quite well.  I saw Billy Reilly back today in the office. He is here for DOT visit. When I last saw him he was doing well denies any chest pain or shortness of breath. That holds true today. His warfarin levels have been well controlled his INR was 3.1 today. He has not had stress testing in over 5 years and his bypass was in 2004. 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  Billy Reilly returns today for follow-up. I last saw him 6 months ago for DOT physical. At that time we performed a nuclear stress test which was negative for ischemia. 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 He denies any chest pain or worsening shortness of breath. Blood pressures been well controlled.  I saw Billy Reilly back today in the office for follow-up. He returns for a DOT letter. He continues to do well after bypass surgery  mechanical valve replacement. His INRs have been therapeutic. He is completely asymptomatic. 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 He had a stress test last year which was negative for ischemia. He is due for repeat echocardiogram to reassess his valve gradients this year.  01/20/2017  Billy Reilly was seen today in follow-up. He is due for renewal of his DOT physical. From a cardiac standpoint he seems stable. Denies a chest pain or worsening shortness of breath. An echocardiogram last year which showed a stable aortic valve gradient. 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 He is on warfarin and INR today was 2.8. EKG shows sinus rhythm.  12/23/2017  Billy Reilly was seen today for routine follow-up.  He continues to work and requires a renewal of his DOT letter.  He has an upcoming echo at the end of February to assess the stability of his valve.  He denies any chest pain worsening shortness of breath.  His INR was a slightly low he needs some adjustment.  EKG is unchanged.  01/10/2019  Billy Reilly returns today for follow-up.  Overall he continues to do well.  He still working for [employer].  He drives oil trucks.  He continues to get annual DOT physicals.  He has an upcoming physical.  His echo last year showed a stable LVEF with normally functioning mechanical aortic valve.  His INR  was therapeutic today on warfarin.  He denies any worsening shortness of breath.  He has had no further chest pain.  He is now more than 15 years out from his bypass surgery.  I did review lab work from December 2019 which showed total cholesterol 146, HDL 40, LDL 89 and triglycerides 81.  His hemoglobin A1c was 8.4.  We discussed diet as well does sound like he has somewhat of increased saturated fats in his diet which could be further optimized.  12/05/2019  Billy Reilly is seen today in follow-up.  Overall he remains asymptomatic.  Denies any new or  worsening chest pain or shortness of breath.  Recently saw his primary care provider and needs clearance for DOT physical.  Today he was noted incidentally to have atrial flutter with borderline ventricular response on his EKG.  Review of prior EKGs did not demonstrate this.  Fortunately he is anticoagulated on warfarin for his mechanical aortic valve.  He has had therapeutic INRs with this and is due for an INR today.  02/24/2020  Billy Reilly is seen today in follow-up. When I last saw him he was in newly recognized atrial flutter. He stated he was asymptomatic. I repeated his echo which showed normal LVEF 65 to 70% however there is severe left atrial enlargement and moderate right atrial enlargement. His mechanical aortic valve seems to be working appropriately. 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 He reports he continues to be asymptomatic. Today he is noted to be in atrial fibrillation.  10/24/2021  Billy Reilly returns today for follow-up.  He is without complaints.  He denies any chest pain or worsening shortness of breath.  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  He is also been followed by his primary care provider closely.  His A1c had increased recently up to 8.4% but he was taking only apparently half of the 25 mg Jardiance tablet to save money.  Total cholesterol was 130 this summer, HDL 43, triglycerides 88 and LDL 70.  10/21/2022  Billy Reilly is seen today in follow-up.  Overall he says he is feeling well.  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  His mechanical aortic valve was noted.  His last echo to assess this was in 2019 which showed normal function of the mechanical aortic valve and normal LVEF 55 to 60%.  Mean gradient across the valve was 20 mmHg.  PMHx:  Past Medical History:  Diagnosis Date   Anemia     AS (aortic stenosis)    valve replacement-st. jude mechanical valve   ASHD (arteriosclerotic heart disease)    CAD (coronary artery disease), native coronary artery    CABG 07/18/2003   Cancer (HCC)    prostate   GERD (gastroesophageal reflux disease)    History of nuclear stress test 02/12/10   EF 67%, normal perfusion   Hyperlipidemia    Hypertension    Morbid obesity ([place])    Type II or unspecified type diabetes mellitus without mention of complication, not stated as uncontrolled    Umbilical hernia    Vitamin B12 deficiency    Vitamin D deficiency     Past Surgical History:  Procedure Laterality Date   AORTIC VALVE REPLACEMENT  07/18/2003   85m ATS-AP valve  AORTIC VALVE REPLACEMENT (AVR)/CORONARY ARTERY BYPASS GRAFTING (CABG)  07/18/2003   AVR; bypass x 2-SVG to first diag, SVG to posterior descending   CARDIAC CATHETERIZATION  07/13/2003   AS-aortic valve gradient 61mhg, valve area 1.1; obstruction of ostium of posterior lateral branch with obstruction in the ostium of first [place] 140/34/7425  RIH, umbilical   NM MYOCAR PERF WALL MOTION  02/12/2010   protocol:Bruce, EF 67%, exercise cap 7 METS, low risk scan   TEE WITHOUT CARDIOVERSION  07/18/2003   no evidence of PE, Aortic valve heavily calcified,    TRANSTHORACIC ECHOCARDIOGRAM  02/21/2013   EF60-65%, Septal motion showed paradox. atrium moderately dilated    FAMHx:  Family History  Problem Relation Age of Onset   Stroke Mother    Heart attack Father    Heart disease Father    Cancer Brother    Cancer Sister     SOCHx:   reports that he has never smoked. He has never used smokeless tobacco. He reports that he does not drink alcohol and does not use drugs.  ALLERGIES:  No Known Allergies  ROS: Pertinent items noted in HPI and remainder of comprehensive ROS otherwise negative.  HOME MEDS: Current Outpatient Medications  Medication Sig Dispense Refill   aspirin 81 MG tablet Take  81 mg by mouth daily.     Cholecalciferol 125 MCG (5000 UT) CHEW Chew 5,000 Units by mouth daily.     CINNAMON PO Take 500 mg by mouth in the morning and at bedtime.      clotrimazole-betamethasone (LOTRISONE) cream Apply to rash 2 to 3 x /day 45 g 3   cyanocobalamin (,VITAMIN B-12,) 1000 MCG/ML injection INJECT 1/2 ML EVERY MONTH 10 mL 2   Cyanocobalamin (B-12 SL) Place under the tongue daily.     enalapril (VASOTEC) 20 MG tablet Take 2 tablets (40 mg total) by mouth daily. 180 tablet 1   famotidine (PEPCID) 20 MG tablet TAKE 1 TABLET BY MOUTH TWICE A DAY WITH MEALS FOR ACIDE INDIGESTION 180 tablet 3   glipiZIDE (GLUCOTROL) 10 MG tablet TAKE 1 TABLET BY MOUTH 3 TIMES A DAY BEFORE MEALS FOR DIABETES 270 tablet 3   glucose blood (ONE TOUCH ULTRA TEST) test strip CHECK BLOOD SUGAR ONCE DAILY 100 each 3   metFORMIN (GLUCOPHAGE-XR) 500 MG 24 hr tablet TAKE 2 TABLETS TWICE A DAY WITH MEALS FOR DIABETES 360 tablet 3   metoprolol succinate (TOPROL-XL) 50 MG 24 hr tablet TAKE 1.5 TABLETS (75 MG TOTAL) BY MOUTH DAILY. TAKE WITH OR IMMEDIATELY FOLLOWING A MEAL. 135 tablet 2   rosuvastatin (CRESTOR) 10 MG tablet TAKE 1 TABLET BY MOUTH EVERY DAY 90 tablet 3   Semaglutide 3 MG TABS Take 1 tablet Daily for Diabetes 30 tablet 0   warfarin (COUMADIN) 5 MG tablet TAKE 1/2 TO 1 TABLET BY MOUTH DAILY OR AS DIRECTED 90 tablet 0   No current facility-administered medications for this visit.    LABS/IMAGING: No results found for this or any previous visit (from the past 48 hour(s)). No results found.  VITALS: BP (!) 154/82   Pulse 65   Ht '5\' 9"'$  (1.753 m)   Wt 213 lb (96.6 kg)   SpO2 97%   BMI 31.45 kg/m   EXAM: Deferred  EKG: A. 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   Mr. Fukushima is doing well without any symptoms related to A-fib.  His rate is well-controlled.  He denies any chest pain or worsening shortness of breath.  He has not had reassessment of his mechanical aortic valve since 2019.  This was a 22 mm ATS mechanical valve.  Will plan a repeat echo to reassess valve gradients in 6 months.  He is due for an INR today.  Follow-up with in 6 months or sooner as necessary.  [name], MD, [facility], [place] Director of the Advanced Lipid Disorders &  Cardiovascular Risk Reduction Clinic Diplomate of the American Board of Clinical Lipidology Attending Cardiologist  Direct Dial: [phone]  Fax: [phone]  Website:  [email or web address] [place] 10/21/2022, 2:06 PM

## 2022-10-21 NOTE — Patient Instructions (Signed)
Description   Do not take any warfarin today then start taking warfarin 1/2 tablet daily except for 1 tablet on Mondays. Recheck INR in 2 weeks (self tester).  [facility]in Clinic [phone]

## 2022-11-03 ENCOUNTER — Other Ambulatory Visit: Payer: Self-pay | Admitting: Nurse Practitioner

## 2022-11-03 DIAGNOSIS — K219 Gastro-esophageal reflux disease without esophagitis: Secondary | ICD-10-CM

## 2022-11-07 ENCOUNTER — Ambulatory Visit (INDEPENDENT_AMBULATORY_CARE_PROVIDER_SITE_OTHER): Payer: Medicare Other

## 2022-11-07 DIAGNOSIS — Z952 Presence of prosthetic heart valve: Secondary | ICD-10-CM

## 2022-11-07 DIAGNOSIS — Z7901 Long term (current) use of anticoagulants: Secondary | ICD-10-CM

## 2022-11-07 LAB — POCT INR: INR: 4.5 — AB (ref 2.0–3.0)

## 2022-11-07 NOTE — Patient Instructions (Signed)
Description   Do not take any warfarin today and eat a serving of green. Then START taking warfarin 1/2 tablet daily.   Recheck INR in 1 week. ( Usually 2 weeks - self tester).  [facility]in Clinic [phone]

## 2022-11-14 ENCOUNTER — Ambulatory Visit (INDEPENDENT_AMBULATORY_CARE_PROVIDER_SITE_OTHER): Payer: Medicare Other | Admitting: Cardiology

## 2022-11-14 DIAGNOSIS — Z7901 Long term (current) use of anticoagulants: Secondary | ICD-10-CM

## 2022-11-14 DIAGNOSIS — Z952 Presence of prosthetic heart valve: Secondary | ICD-10-CM

## 2022-11-14 DIAGNOSIS — Z5181 Encounter for therapeutic drug level monitoring: Secondary | ICD-10-CM | POA: Diagnosis not present

## 2022-11-14 LAB — POCT INR: INR: 2.6 (ref 2.0–3.0)

## 2022-11-23 ENCOUNTER — Other Ambulatory Visit: Payer: Self-pay | Admitting: Internal Medicine

## 2022-11-25 ENCOUNTER — Ambulatory Visit (INDEPENDENT_AMBULATORY_CARE_PROVIDER_SITE_OTHER): Payer: Medicare Other | Admitting: Internal Medicine

## 2022-11-25 DIAGNOSIS — Z952 Presence of prosthetic heart valve: Secondary | ICD-10-CM | POA: Diagnosis not present

## 2022-11-25 DIAGNOSIS — Z7901 Long term (current) use of anticoagulants: Secondary | ICD-10-CM | POA: Diagnosis not present

## 2022-11-25 DIAGNOSIS — Z5181 Encounter for therapeutic drug level monitoring: Secondary | ICD-10-CM

## 2022-11-25 DIAGNOSIS — I480 Paroxysmal atrial fibrillation: Secondary | ICD-10-CM | POA: Diagnosis not present

## 2022-11-25 LAB — POCT INR: INR: 3.4 — AB (ref 2.0–3.0)

## 2022-12-08 ENCOUNTER — Ambulatory Visit (INDEPENDENT_AMBULATORY_CARE_PROVIDER_SITE_OTHER): Payer: Medicare Other

## 2022-12-08 DIAGNOSIS — Z7901 Long term (current) use of anticoagulants: Secondary | ICD-10-CM

## 2022-12-08 DIAGNOSIS — Z952 Presence of prosthetic heart valve: Secondary | ICD-10-CM | POA: Diagnosis not present

## 2022-12-08 LAB — POCT INR: INR: 2.6 (ref 2.0–3.0)

## 2022-12-08 NOTE — Patient Instructions (Signed)
Description   Continue warfarin 1/2 tablet daily.   Recheck INR in 2 week.  [facility] [phone]

## 2022-12-17 ENCOUNTER — Other Ambulatory Visit: Payer: Self-pay | Admitting: Internal Medicine

## 2022-12-19 ENCOUNTER — Other Ambulatory Visit: Payer: Self-pay | Admitting: Internal Medicine

## 2022-12-24 ENCOUNTER — Telehealth: Payer: Self-pay | Admitting: *Deleted

## 2022-12-24 ENCOUNTER — Ambulatory Visit (INDEPENDENT_AMBULATORY_CARE_PROVIDER_SITE_OTHER): Payer: Medicare Other | Admitting: *Deleted

## 2022-12-24 DIAGNOSIS — Z7901 Long term (current) use of anticoagulants: Secondary | ICD-10-CM | POA: Diagnosis not present

## 2022-12-24 DIAGNOSIS — Z952 Presence of prosthetic heart valve: Secondary | ICD-10-CM | POA: Diagnosis not present

## 2022-12-24 LAB — POCT INR: INR: 2.9 (ref 2.0–3.0)

## 2022-12-24 NOTE — Telephone Encounter (Signed)
Called pt to remind him his INR is due; he states he will perform the test now & to call him back in a few.

## 2022-12-24 NOTE — Patient Instructions (Addendum)
Description   Spoke with patient and advised to continue taking warfarin 1/2 tablet daily.   Recheck INR in 2 weeks. (Prefers 15th & last day of month). [facility] [phone]      

## 2022-12-29 ENCOUNTER — Ambulatory Visit: Payer: Medicare Other | Admitting: Nurse Practitioner

## 2022-12-30 ENCOUNTER — Telehealth: Payer: Self-pay | Admitting: Internal Medicine

## 2022-12-30 NOTE — Telephone Encounter (Signed)
*  STAT* If patient is at the pharmacy, call can be transferred to refill team.   1. Which medications need to be refilled? (please list name of each medication and dose if known) enalapril (VASOTEC) 20 MG tablet   2. Which pharmacy/location (including street and city if local pharmacy) is medication to be sent to?  [id number]- [place], [place] - [address]   3. Do they need a 30 day or 90 day supply? [address]

## 2022-12-31 MED ORDER — ENALAPRIL MALEATE 20 MG PO TABS: 40.0000 mg | ORAL_TABLET | Freq: Every day | ORAL | 1 refills | Status: DC

## 2022-12-31 NOTE — Progress Notes (Unsigned)
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   Coronary artery disease due to lipid rich plaque Control blood pressure, cholesterol, glucose, increase exercise.  Followed by cardiology -     Lipid panel  Essential hypertension Continue medication Monitor blood pressure at home; call if consistently over 140/80 Continue DASH diet.   Reminder to go to the ER if any CP, SOB, nausea, dizziness, severe HA, changes vision/speech, left arm numbness and tingling and jaw pain.  H/O mechanical aortic valve replacement Continue coumadin; followed by cardiology  Hx of CABG Control blood pressure, cholesterol, glucose, increase exercise.  Followed by cardiolgy  Acquired thrombophilia (HCC)/ Long term current use of anticoagulant therapy Managed by cardiology Discussed if patient falls to immediately contact office or go to ER. Discussed foods that can increase or decrease Coumadin levels.  Patient understands to call the office before starting a new medication.  Type II diabetes mellitus with nephropathy [facility]) Education: Reviewed 'ABCs' of diabetes management (respective goals in parentheses):  A1C (<8), blood pressure (<130/80), and cholesterol (LDL <70) Eye Exam yearly and Dental Exam every 6 months  Dietary recommendations Physical Activity recommendations UTD foot exam  -     Hemoglobin A1c  Mixed hyperlipidemia associated with T2DM (HCC) Currently above goal of LDL  <70 Plan switch to rosuvastatin 10 mg daily if remains above goal today Continue low cholesterol diet and exercise.  Check lipid panel.  -     Lipid panel -     TSH  CKD II associated with T2DM (HCC) Increase fluids, avoid NSAIDS, monitor sugars, will monitor  Obesity (BMI 30.0-34.9) Long discussion about weight loss, diet, and exercise Recommended diet heavy in fruits and veggies and low in animal meats, cheeses, and dairy products, appropriate calorie intake Discussed appropriate weight for height and initial goal (200 lb) Follow up at next visit  Umbilical hernia without obstruction and without gangrene No complications; continue to monitor  Vitamin B12 deficiency Continue supplement; check levels at CPE  Vitamin D deficiency Patient admits taking irregularly, encouraged daily 5000 IU, take with all other meds Check vitamin D next visit   Medication management -     CBC with Differential/Platelet -     CMPGFR   Over 30 minutes of exam, counseling, chart review, and critical decision making was performed  Future Appointments  Date Time Provider [place]  01/01/2023  2:30 PM Alycia Rossetti, NP [facility] None  04/15/2023 10:20 AM [facility] 2 [id number]  05/12/2023 10:00 AM Unk Pinto, MD [facility] None  06/15/2023  3:00 PM [name], MD [facility] None  11/30/2023 11:00 AM Darrol Jump, NP [facility] None     Plan:   During the course of the visit the patient was educated and counseled about appropriate screening and preventive services including:   Pneumococcal vaccine  Influenza vaccine Prevnar 13 Td vaccine Screening electrocardiogram Colorectal cancer screening Diabetes screening Glaucoma screening Nutrition counseling    Subjective:  Billy Reilly is a 82 y.o. male with hx of ASCAD/CABG/AoVR  who presents for Medicare Annual Wellness Visit and 3 month follow up for HTN, hyperlipidemia, T2DM, and vitamin D Def.    Wife's health is not doing well, advanced kidney disease and discussing starting dialysis.   GERD is well controlled by famotidine 20 mg BID.   BMI is There is no height or weight on file to calculate BMI., he has been working on diet and exercise. Weight goal < 200 lb, he has been reducing breakfast meat, eggs, doing oatmeal instead, doing protein + veggies for meal whenever possible, limited evening intake.  He drinks diet mountain dew - <1 bottle daily. He drinks unsweet tea.  Wt Readings from Last 3 Encounters:  10/21/22 213 lb (96.6 kg)  09/23/22 200 lb (90.7 kg)  06/19/22 204 lb 9.6 oz (92.8 kg)   He has hx of CABG & Mech AoVR in 2004; he is on coumadin and followed by coumadin clinic. Nuclear Stress test was negative in 2016 by Dr Debara Pickett. In early 2021 he was found to be in a. Fib/flutter at cardiology, asymptomatic. Had follow up ECHO 12/27/2019 showed severe/mod atrial enlargement with normal EF. Also incidentally noted ascending aortic aneurysm 4.4 cm, had 6 month CTA 06/2020 that showed 4.0 cm dilation and recommended annual follow up.   His blood pressure has been controlled at home (120s/70s), today their BP is   He does not workout. He denies chest pain, shortness of breath, dizziness.   He is on cholesterol medication (simvastatin 40 mg daily ) and denies myalgias. His cholesterol is not at goal for LDL <70. The cholesterol last visit was:   Lab Results  Component Value Date   CHOL 139 09/23/2022   HDL 42 09/23/2022   LDLCALC 79 09/23/2022   TRIG 99 09/23/2022   CHOLHDL 3.3 09/23/2022   He has not been working on diet and exercise for T2DM (metformin 2000 mg daily, glipizide 10 mg - 4 tabs daily over meals, jardiance 25 mg daily, cinnamon), and denies foot ulcerations, hypoglycemia , increased appetite, nausea, paresthesia of the feet, polydipsia, polyuria, visual disturbances, vomiting and weight loss.  He checks a fasting glucose a few days a week, recently 160-165.   Has  had hypoglycemia in the past, typically "has sweats" but none recently.  Last A1C in the office was:  Lab Results  Component Value Date   HGBA1C 10.3 (H) 09/23/2022   He has CKD I/II associated with T2DM monitored at this office:  Lab Results  Component Value Date   GFRNONAA 91 04/02/2021    Patient is not on Vitamin D supplement and very low at last check, admits has been forgetting to take, but has been taking 5000 IU 2-3 times a week:    Lab Results  Component Value Date   VD25OH 77 09/23/2022       Medication Review: Current Outpatient Medications on File Prior to Visit  Medication Sig Dispense Refill   aspirin 81 MG tablet Take 81 mg by mouth daily.     Cholecalciferol 125 MCG (5000 UT) CHEW Chew 5,000 Units by mouth daily.     CINNAMON PO Take 500 mg by mouth in the morning and at bedtime.      clotrimazole-betamethasone (LOTRISONE) cream Apply to rash 2 to 3 x /day 45 g 3   cyanocobalamin (,VITAMIN B-12,) 1000 MCG/ML injection INJECT 1/2 ML EVERY MONTH 10 mL 2   Cyanocobalamin (B-12 SL) Place under the tongue daily.     enalapril (VASOTEC) 20 MG tablet Take  2 tablets (40 mg total) by mouth daily. 180 tablet 1   famotidine (PEPCID) 20 MG tablet TAKE 1 TABLET BY MOUTH TWICE A DAY WITH MEALS FOR ACIDE INDIGESTION 180 tablet 3   glipiZIDE (GLUCOTROL) 10 MG tablet TAKE 1 TABLET BY MOUTH 3 TIMES A DAY BEFORE MEALS FOR DIABETES 270 tablet 3   glucose blood (ONE TOUCH ULTRA TEST) test strip CHECK BLOOD SUGAR ONCE DAILY 100 each 3   metFORMIN (GLUCOPHAGE-XR) 500 MG 24 hr tablet TAKE 2 TABLETS TWICE A DAY WITH MEALS FOR DIABETES 360 tablet 3   metoprolol succinate (TOPROL-XL) 50 MG 24 hr tablet TAKE 1 AND 1/2 TABLETS DAILY (75 MG TOTAL) BY MOUTH WITH FOOD OR IMMEDIATELY AFTER MEALS 135 tablet 2   rosuvastatin (CRESTOR) 10 MG tablet TAKE 1 TABLET BY MOUTH EVERY DAY 90 tablet 3   Semaglutide 3 MG TABS Take 1 tablet Daily for Diabetes (Patient not taking: Reported on 10/21/2022) 30 tablet  0   warfarin (COUMADIN) 5 MG tablet TAKE 1/2 TO 1 TABLET BY MOUTH DAILY OR AS DIRECTED 90 tablet 0   No current facility-administered medications on file prior to visit.    Allergies: No Known Allergies  Current Problems (verified) has Long term current use of anticoagulant therapy; H/O mechanical aortic valve replacement; Hyperlipidemia associated with type 2 diabetes mellitus ([place]); Type II diabetes mellitus with nephropathy ([place]); Vitamin D deficiency; Vitamin B12 deficiency; Essential hypertension; Medication management; Coronary artery disease due to lipid rich plaque; Hx of CABG; Obesity (BMI 30.0-34.9); CKD stage 2 due to type 2 diabetes mellitus ([place]); Atrial fibrillation ([place]); GERD (gastroesophageal reflux disease); Ascending aortic aneurysm; and Aortic atherosclerosis ([place]) by CTA 06/2020 on their problem list.  Screening Tests Immunization History  Administered Date(s) Administered   Fluad Quad(high Dose 65+) 10/14/2020, 09/07/2021   Influenza Whole 09/01/2012   Influenza, High Dose Seasonal PF 09/27/2015, 10/14/2020, 09/23/2022   Influenza-Unspecified 08/25/2016, 08/24/2018, 09/13/2019   PFIZER Comirnaty(Gray Top)Covid-19 Tri-Sucrose Vaccine 12/18/2020   PFIZER(Purple Top)SARS-COV-2 Vaccination 03/18/2020, 04/13/2020   Pneumococcal Conjugate-13 12/18/2015   Pneumococcal Polysaccharide-23 09/01/2012   Tdap 11/25/2007    Preventative care: Last colonoscopy: 2011  Cologuard: 11/2018 negative   Prior vaccinations: TD or Tdap: 2009 DUE patient declines Influenza: 09/2020  Pneumococcal: 2013 Prevnar13: 2017 Shingles/Zostavax: declines Covid 19: 3/3, pfizer   Names of Other Physician/Practitioners you currently use: 1. [facility] here for primary care 2. Dr. Delman Cheadle, eye doctor, last visit 2021, report requested, reports has upcoming, will see Dr. Tyrone Schimke for bil cataracts thisyear  3. Dr. Delphia Grates, dentist, last visit 2021, goes  q21m DUE, looking for new dentist  Patient Care Team: MUnk Pinto MD as PCP - General (Internal Medicine) HDebara PickettKNadean Corwin MD as PCP - Cardiology (Cardiology) HDebara PickettKNadean Corwin MD as Consulting Physician (Cardiology) ELaurence Spates MD (Inactive) as Consulting Physician (Gastroenterology)  Surgical: He  has a past surgical history that includes Aortic valve replacement (07/18/2003); Hernia repair (09/11/2011); Aortic valve replacement (avr)/coronary artery bypass grafting (cabg) (07/18/2003); Cardiac catheterization (07/13/2003); TEE without cardioversion (07/18/2003); transthoracic echocardiogram (02/21/2013); and NM MYOCAR PERF WALL MOTION (02/12/2010). Family His family history includes Cancer in his brother and sister; Heart attack in his father; Heart disease in his father; Stroke in his mother. Social history  He reports that he has never smoked. He has never used smokeless tobacco. He reports that he does not drink alcohol and does not use drugs.  MEDICARE WELLNESS OBJECTIVES: Physical activity:   Cardiac risk factors:  Depression/mood screen:      09/22/2022   11:40 PM  Depression screen PHQ 2/9  Decreased Interest 0  Down, Depressed, Hopeless 0  PHQ - 2 Score 0    ADLs:     06/19/2022   12:41 PM 03/13/2022   12:39 AM  In your present state of health, do you have any difficulty performing the following activities:  Hearing? 0 0  Vision? 0 0  Difficulty concentrating or making decisions? 0 0  Walking or climbing stairs? 0 0  Dressing or bathing? 0 0  Doing errands, shopping? 0 0  Preparing Food and eating ? N   Using the Toilet? N   In the past six months, have you accidently leaked urine? N   Do you have problems with loss of bowel control? N   Managing your Medications? N   Managing your Finances? N   Housekeeping or managing your Housekeeping? N      Cognitive Testing  Alert? Yes  Normal Appearance?Yes  Oriented to person? Yes  Place? Yes   Time?  Yes  Recall of three objects?  Yes  Can perform simple calculations? Yes  Displays appropriate judgment?Yes  Can read the correct time from a watch face?Yes  EOL planning:     Objective:   There were no vitals filed for this visit.  There is no height or weight on file to calculate BMI.  General appearance: alert, no distress, WD/WN, male HEENT: normocephalic, sclerae anicteric, TMs pearly, nares patent, no discharge or erythema, pharynx normal Oral cavity: MMM, no lesions Neck: supple, no lymphadenopathy, no thyromegaly, no masses Heart: Irregularly irregular, normal S1, S2, no murmurs Lungs: CTA bilaterally, no wheezes, rhonchi, or rales Abdomen: +bs, soft, non tender, non distended, no masses, no hepatomegaly, no splenomegaly Musculoskeletal: nontender, no swelling, no obvious deformity Extremities: no edema, no cyanosis, no clubbing Pulses: 2+ symmetric, upper and lower extremities, normal cap refill Neurological: alert, oriented x 3, CN2-12 intact, strength normal upper extremities and lower extremities, sensation normal throughout, DTRs 2+ throughout, no cerebellar signs, gait normal Psychiatric: normal affect, behavior normal, pleasant   Medicare Attestation I have personally reviewed: The patient's medical and social history Their use of alcohol, tobacco or illicit drugs Their current medications and supplements The patient's functional ability including ADLs,fall risks, home safety risks, cognitive, and hearing and visual impairment Diet and physical activities Evidence for depression or mood disorders  The patient's weight, height, BMI, and visual acuity have been recorded in the chart.  I have made referrals, counseling, and provided education to the patient based on review of the above and I have provided the patient with a written personalized care plan for preventive services.     Alycia Rossetti, NP   12/31/2022

## 2023-01-01 ENCOUNTER — Encounter: Payer: Self-pay | Admitting: Nurse Practitioner

## 2023-01-01 ENCOUNTER — Ambulatory Visit (INDEPENDENT_AMBULATORY_CARE_PROVIDER_SITE_OTHER): Payer: Medicare Other | Admitting: Nurse Practitioner

## 2023-01-01 VITALS — BP 128/74 | HR 51 | Temp 97.7°F | Ht 68.0 in | Wt 204.4 lb

## 2023-01-01 DIAGNOSIS — E538 Deficiency of other specified B group vitamins: Secondary | ICD-10-CM

## 2023-01-01 DIAGNOSIS — R6889 Other general symptoms and signs: Secondary | ICD-10-CM | POA: Diagnosis not present

## 2023-01-01 DIAGNOSIS — I700 Atherosclerosis of aorta: Secondary | ICD-10-CM

## 2023-01-01 DIAGNOSIS — I482 Chronic atrial fibrillation, unspecified: Secondary | ICD-10-CM

## 2023-01-01 DIAGNOSIS — Z7901 Long term (current) use of anticoagulants: Secondary | ICD-10-CM

## 2023-01-01 DIAGNOSIS — E785 Hyperlipidemia, unspecified: Secondary | ICD-10-CM

## 2023-01-01 DIAGNOSIS — I7121 Aneurysm of the ascending aorta, without rupture: Secondary | ICD-10-CM | POA: Diagnosis not present

## 2023-01-01 DIAGNOSIS — Z952 Presence of prosthetic heart valve: Secondary | ICD-10-CM

## 2023-01-01 DIAGNOSIS — E1169 Type 2 diabetes mellitus with other specified complication: Secondary | ICD-10-CM | POA: Diagnosis not present

## 2023-01-01 DIAGNOSIS — Z0001 Encounter for general adult medical examination with abnormal findings: Secondary | ICD-10-CM | POA: Diagnosis not present

## 2023-01-01 DIAGNOSIS — E559 Vitamin D deficiency, unspecified: Secondary | ICD-10-CM | POA: Diagnosis not present

## 2023-01-01 DIAGNOSIS — Z0000 Encounter for general adult medical examination without abnormal findings: Secondary | ICD-10-CM

## 2023-01-01 DIAGNOSIS — E1122 Type 2 diabetes mellitus with diabetic chronic kidney disease: Secondary | ICD-10-CM | POA: Diagnosis not present

## 2023-01-01 DIAGNOSIS — I2583 Coronary atherosclerosis due to lipid rich plaque: Secondary | ICD-10-CM

## 2023-01-01 DIAGNOSIS — E119 Type 2 diabetes mellitus without complications: Secondary | ICD-10-CM

## 2023-01-01 DIAGNOSIS — E669 Obesity, unspecified: Secondary | ICD-10-CM

## 2023-01-01 DIAGNOSIS — Z79899 Other long term (current) drug therapy: Secondary | ICD-10-CM

## 2023-01-01 DIAGNOSIS — I10 Essential (primary) hypertension: Secondary | ICD-10-CM

## 2023-01-01 DIAGNOSIS — I2510 Atherosclerotic heart disease of native coronary artery without angina pectoris: Secondary | ICD-10-CM | POA: Diagnosis not present

## 2023-01-01 DIAGNOSIS — N182 Chronic kidney disease, stage 2 (mild): Secondary | ICD-10-CM

## 2023-01-01 DIAGNOSIS — D6869 Other thrombophilia: Secondary | ICD-10-CM

## 2023-01-01 DIAGNOSIS — Z951 Presence of aortocoronary bypass graft: Secondary | ICD-10-CM

## 2023-01-01 NOTE — Patient Instructions (Signed)

## 2023-01-02 LAB — COMPLETE METABOLIC PANEL WITHOUT GFR
AG Ratio: 1.8 (calc) (ref 1.0–2.5)
ALT: 18 U/L (ref 9–46)
AST: 15 U/L (ref 10–35)
Albumin: 4.4 g/dL (ref 3.6–5.1)
Alkaline phosphatase (APISO): 85 U/L (ref 35–144)
BUN: 18 mg/dL (ref 7–25)
CO2: 25 mmol/L (ref 20–32)
Calcium: 9.5 mg/dL (ref 8.6–10.3)
Chloride: 102 mmol/L (ref 98–110)
Creat: 0.87 mg/dL (ref 0.70–1.22)
Globulin: 2.5 g/dL (ref 1.9–3.7)
Glucose, Bld: 316 mg/dL — ABNORMAL HIGH (ref 65–99)
Potassium: 4.7 mmol/L (ref 3.5–5.3)
Sodium: 139 mmol/L (ref 135–146)
Total Bilirubin: 1.2 mg/dL (ref 0.2–1.2)
Total Protein: 6.9 g/dL (ref 6.1–8.1)
eGFR: 87 mL/min/{1.73_m2}

## 2023-01-02 LAB — CBC WITH DIFFERENTIAL/PLATELET
Absolute Monocytes: 470 {cells}/uL (ref 200–950)
Basophils Absolute: 62 {cells}/uL (ref 0–200)
Basophils Relative: 1.1 %
Eosinophils Absolute: 118 {cells}/uL (ref 15–500)
Eosinophils Relative: 2.1 %
HCT: 49.1 % (ref 38.5–50.0)
Hemoglobin: 17.4 g/dL — ABNORMAL HIGH (ref 13.2–17.1)
Lymphs Abs: 1327 {cells}/uL (ref 850–3900)
MCH: 34.4 pg — ABNORMAL HIGH (ref 27.0–33.0)
MCHC: 35.4 g/dL (ref 32.0–36.0)
MCV: 97 fL (ref 80.0–100.0)
MPV: 11.7 fL (ref 7.5–12.5)
Monocytes Relative: 8.4 %
Neutro Abs: 3623 {cells}/uL (ref 1500–7800)
Neutrophils Relative %: 64.7 %
Platelets: 178 10*3/uL (ref 140–400)
RBC: 5.06 Million/uL (ref 4.20–5.80)
RDW: 13.2 % (ref 11.0–15.0)
Total Lymphocyte: 23.7 %
WBC: 5.6 10*3/uL (ref 3.8–10.8)

## 2023-01-02 LAB — HEMOGLOBIN A1C
Hgb A1c MFr Bld: 9.8 %{Hb} — ABNORMAL HIGH
Mean Plasma Glucose: 235 mg/dL
eAG (mmol/L): 13 mmol/L

## 2023-01-02 LAB — LIPID PANEL
Cholesterol: 154 mg/dL
HDL: 44 mg/dL
LDL Cholesterol (Calc): 83 mg/dL
Non-HDL Cholesterol (Calc): 110 mg/dL
Total CHOL/HDL Ratio: 3.5 (calc)
Triglycerides: 167 mg/dL — ABNORMAL HIGH

## 2023-01-08 ENCOUNTER — Ambulatory Visit (INDEPENDENT_AMBULATORY_CARE_PROVIDER_SITE_OTHER): Payer: Medicare Other

## 2023-01-08 DIAGNOSIS — Z952 Presence of prosthetic heart valve: Secondary | ICD-10-CM | POA: Diagnosis not present

## 2023-01-08 DIAGNOSIS — Z7901 Long term (current) use of anticoagulants: Secondary | ICD-10-CM | POA: Diagnosis not present

## 2023-01-08 LAB — POCT INR: INR: 2.2 (ref 2.0–3.0)

## 2023-01-08 NOTE — Patient Instructions (Signed)
Description   Spoke with patient and advised to continue taking warfarin 1/2 tablet daily.   Recheck INR in 2 weeks. (Prefers 15th & last day of month). [facility] [phone]

## 2023-01-22 ENCOUNTER — Ambulatory Visit (INDEPENDENT_AMBULATORY_CARE_PROVIDER_SITE_OTHER): Payer: Medicare Other

## 2023-01-22 DIAGNOSIS — Z952 Presence of prosthetic heart valve: Secondary | ICD-10-CM

## 2023-01-22 DIAGNOSIS — Z7901 Long term (current) use of anticoagulants: Secondary | ICD-10-CM

## 2023-01-22 DIAGNOSIS — I480 Paroxysmal atrial fibrillation: Secondary | ICD-10-CM | POA: Diagnosis not present

## 2023-01-22 LAB — POCT INR: INR: 2 (ref 2.0–3.0)

## 2023-01-22 NOTE — Patient Instructions (Signed)
Description   Spoke with patient and advised to take 1 tablet today and then continue taking warfarin 1/2 tablet daily.   Recheck INR in 2 weeks. (Prefers 15th & last day of month). [facility] [phone]

## 2023-02-06 ENCOUNTER — Ambulatory Visit (INDEPENDENT_AMBULATORY_CARE_PROVIDER_SITE_OTHER): Payer: Medicare Other | Admitting: *Deleted

## 2023-02-06 DIAGNOSIS — Z952 Presence of prosthetic heart valve: Secondary | ICD-10-CM | POA: Diagnosis not present

## 2023-02-06 DIAGNOSIS — Z7901 Long term (current) use of anticoagulants: Secondary | ICD-10-CM | POA: Diagnosis not present

## 2023-02-06 LAB — POCT INR: INR: 2.3 (ref 2.0–3.0)

## 2023-02-06 NOTE — Patient Instructions (Addendum)
Description   Spoke with patient and advised to continue taking warfarin 1/2 tablet daily.   Recheck INR in 2 weeks. (Prefers 15th & last day of month). [facility] [phone]      

## 2023-02-23 ENCOUNTER — Ambulatory Visit: Payer: Medicare Other | Attending: Internal Medicine | Admitting: *Deleted

## 2023-02-23 DIAGNOSIS — Z952 Presence of prosthetic heart valve: Secondary | ICD-10-CM | POA: Diagnosis not present

## 2023-02-23 DIAGNOSIS — Z7901 Long term (current) use of anticoagulants: Secondary | ICD-10-CM

## 2023-02-23 LAB — POCT INR: INR: 2.2 (ref 2.0–3.0)

## 2023-02-23 NOTE — Patient Instructions (Signed)
Description   Continue taking warfarin 1/2 tablet daily.  Recheck INR in 2 weeks. (Prefers 15th & last day of month). [facility] [phone]

## 2023-03-03 ENCOUNTER — Telehealth: Payer: Self-pay | Admitting: *Deleted

## 2023-03-03 NOTE — Telephone Encounter (Signed)
Received a fax from [employer] that pt's INR is overdue x 11 days. Spoke with [employer] representative and advised that the pt came in on 02/23/23 to have INR checked. The representative states she will update this. Advised that the pt is due to perform next test on 03/09/23. She was thankful for the update.

## 2023-03-09 ENCOUNTER — Ambulatory Visit: Payer: Medicare Other | Attending: Internal Medicine | Admitting: *Deleted

## 2023-03-09 DIAGNOSIS — Z7901 Long term (current) use of anticoagulants: Secondary | ICD-10-CM | POA: Diagnosis not present

## 2023-03-09 DIAGNOSIS — Z952 Presence of prosthetic heart valve: Secondary | ICD-10-CM | POA: Diagnosis not present

## 2023-03-09 LAB — POCT INR: INR: 3.1 — AB (ref 2.0–3.0)

## 2023-03-09 NOTE — Patient Instructions (Signed)
Description   Do not take any warfarin today then continue taking warfarin 1/2 tablet daily.  Recheck INR in 2 weeks at home. (Prefers 15th & last day of month). [facility] [phone]

## 2023-03-17 ENCOUNTER — Encounter: Payer: Medicare Other | Admitting: Internal Medicine

## 2023-03-24 ENCOUNTER — Ambulatory Visit (INDEPENDENT_AMBULATORY_CARE_PROVIDER_SITE_OTHER): Payer: Medicare Other

## 2023-03-24 DIAGNOSIS — Z7901 Long term (current) use of anticoagulants: Secondary | ICD-10-CM | POA: Diagnosis not present

## 2023-03-24 DIAGNOSIS — Z952 Presence of prosthetic heart valve: Secondary | ICD-10-CM | POA: Diagnosis not present

## 2023-03-24 LAB — POCT INR: INR: 2.2 (ref 2.0–3.0)

## 2023-03-24 NOTE — Patient Instructions (Signed)
Description   Spoke with pt advised pt to continue taking warfarin 1/2 tablet daily.  Recheck INR in 2 weeks at home. (Prefers 15th & last day of month). [facility] [phone]

## 2023-04-08 ENCOUNTER — Ambulatory Visit (INDEPENDENT_AMBULATORY_CARE_PROVIDER_SITE_OTHER): Payer: Medicare Other

## 2023-04-08 ENCOUNTER — Telehealth: Payer: Self-pay | Admitting: Internal Medicine

## 2023-04-08 DIAGNOSIS — Z952 Presence of prosthetic heart valve: Secondary | ICD-10-CM

## 2023-04-08 DIAGNOSIS — I480 Paroxysmal atrial fibrillation: Secondary | ICD-10-CM | POA: Diagnosis not present

## 2023-04-08 DIAGNOSIS — Z7901 Long term (current) use of anticoagulants: Secondary | ICD-10-CM | POA: Diagnosis not present

## 2023-04-08 LAB — POCT INR: INR: 6.1 — AB (ref 2.0–3.0)

## 2023-04-08 NOTE — Telephone Encounter (Signed)
Please refer to anticoagulation encounter.

## 2023-04-08 NOTE — Patient Instructions (Signed)
Description   Spoke with pt advised pt to eat greens, HOLD today's dose and tomorrow's dose then continue taking warfarin 1/2 tablet daily.   If you experience any signs or symptoms of bleeding, seek immediate medical attention.  Recheck INR on Monday, 04/13/23. (Prefers 15th & last day of month).  [facility] [phone]

## 2023-04-08 NOTE — Telephone Encounter (Signed)
Pt called to report his INR is 6.1 today

## 2023-04-13 ENCOUNTER — Telehealth: Payer: Self-pay

## 2023-04-13 NOTE — Telephone Encounter (Signed)
Lpm to check INR 

## 2023-04-14 ENCOUNTER — Ambulatory Visit (INDEPENDENT_AMBULATORY_CARE_PROVIDER_SITE_OTHER): Payer: Medicare Other | Admitting: Cardiology

## 2023-04-14 DIAGNOSIS — Z7901 Long term (current) use of anticoagulants: Secondary | ICD-10-CM | POA: Diagnosis not present

## 2023-04-14 DIAGNOSIS — Z952 Presence of prosthetic heart valve: Secondary | ICD-10-CM

## 2023-04-14 LAB — POCT INR: INR: 3 (ref 2.0–3.0)

## 2023-04-15 ENCOUNTER — Ambulatory Visit (HOSPITAL_COMMUNITY): Payer: Medicare Other | Attending: Cardiology

## 2023-04-15 DIAGNOSIS — Z952 Presence of prosthetic heart valve: Secondary | ICD-10-CM | POA: Diagnosis present

## 2023-04-15 LAB — ECHOCARDIOGRAM COMPLETE
AR max vel: 1.55 cm2
AV Area VTI: 1.88 cm2
AV Area mean vel: 1.6 cm2
AV Mean grad: 13.4 mmHg
AV Peak grad: 25.4 mmHg
Ao pk vel: 2.52 m/s
S' Lateral: 2.95 cm

## 2023-04-21 ENCOUNTER — Encounter: Payer: Self-pay | Admitting: Internal Medicine

## 2023-04-27 ENCOUNTER — Ambulatory Visit (INDEPENDENT_AMBULATORY_CARE_PROVIDER_SITE_OTHER): Payer: Medicare Other | Admitting: Cardiology

## 2023-04-27 DIAGNOSIS — Z952 Presence of prosthetic heart valve: Secondary | ICD-10-CM

## 2023-04-27 DIAGNOSIS — Z7901 Long term (current) use of anticoagulants: Secondary | ICD-10-CM

## 2023-04-27 LAB — POCT INR: INR: 2.8 (ref 2.0–3.0)

## 2023-05-11 ENCOUNTER — Ambulatory Visit (INDEPENDENT_AMBULATORY_CARE_PROVIDER_SITE_OTHER): Payer: Medicare Other

## 2023-05-11 ENCOUNTER — Encounter: Payer: Self-pay | Admitting: Internal Medicine

## 2023-05-11 DIAGNOSIS — Z7901 Long term (current) use of anticoagulants: Secondary | ICD-10-CM

## 2023-05-11 DIAGNOSIS — Z952 Presence of prosthetic heart valve: Secondary | ICD-10-CM

## 2023-05-11 LAB — POCT INR: INR: 3.2 — AB (ref 2.0–3.0)

## 2023-05-11 NOTE — Progress Notes (Signed)
Annual  Screening/Preventative Visit  & Comprehensive Evaluation & Examination   Future Appointments  Date Time Provider Department  05/12/2023                                cpe 10:00 AM Billy Cowboy, MD [facility]  06/15/2023  3:00 PM Billy Reilly Lisette Abu, MD [facility]  11/30/2023                                  wellness 11:00 AM Billy Glimpse, NP [facility]  05/23/2024                                cpe 10:00 AM Billy Cowboy, MD [facility]              This very nice 82 y.o. [facility] ( widowed Oct 2022 ) presents for a Screening /Preventative Visit & comprehensive evaluation and management of multiple medical co-morbidities.  Patient has been followed for HTN, HLD, T2_NIDDM  and Vitamin D Deficiency.       HTN predates since  39. Patient's BP has been controlled at home.  In 2004, patient had a CABG &  St Jude AoVR and has been on Coumadin since.  Today's BP is at goal  -  130/70.   Patient had a Negative Myoview in  2016 by Dr Billy Reilly.  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  Patient denies any cardiac symptoms as chest pain, palpitations, shortness of breath, dizziness or ankle swelling.       Patient's hyperlipidemia is controlled with diet and medications. Patient denies myalgias or other medication SE's. 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   Patient denies reactive hypoglycemic symptoms, visual blurring, diabetic polys or paresthesias. Last A1c was not at goal :   Lab Results  Component Value Date   HGBA1C 9.8 (H) 01/01/2023         Finally, patient has history of Vitamin D Deficiency ("28" /2012) and last vitamin D was  still very low  ( goal 70-100) :   Lab Results  Component Value  Date   VD25OH 77 09/23/2022       Current Outpatient Medications on File Prior to Visit  Medication Sig   aspirin 81 MG tablet Take  daily.   Cholecalciferol 5000 u take  daily.   CINNAMON 500 mg  Take   morning and  bedtime.    clotrimazole-betamethasone cream Apply to rash 2 to 3 x /day   VITAMIN B-12 1000 MCG/ML inj INJECT 1/2 ML EVERY MONTH   enalapril 20 MG tablet Take 2 tablets daily.   famotidine 20 MG tablet TAKE 1 TABLET TWICE A DAY WITH MEALS    glipiZIDE 10 MG tablet TAKE 1 TAB 3 TIMES A DAY BEFORE MEALS    JARDIANCE 25 MG TABS tablet TAKE 1 TABLET  DAILY FOR DIABETES   metFORMIN -XR  500 MG TAKE 2 TABLETS  TWICE A DAY WITH MEALS    metoprolol succinate-XL 50 MG  Take 1.5 tablets (75 mg total)  daily.    rosuvastatin 10 MG tablet TAKE 1 TABLET EVERY DAY   warfarin 5 MG tablet TAKE 1/2 TO 1 TABLET  DAILY     No Known Allergies   Past Medical History:  Diagnosis Date   Anemia    AS (aortic stenosis)    valve replacement-st. jude mechanical valve   ASHD (arteriosclerotic heart disease)    CAD (coronary artery disease), native coronary artery    CABG 07/18/2003   Cancer (HCC)    prostate   GERD (gastroesophageal reflux disease)    History of nuclear stress test 02/12/10   EF 67%, normal perfusion   Hyperlipidemia    Hypertension    Morbid obesity (HCC)    Type II or unspecified type diabetes mellitus without mention of complication, not stated as uncontrolled    Umbilical hernia    Vitamin B12 deficiency    Vitamin D deficiency      Health Maintenance  Topic Date Due   Zoster Vaccines- Shingrix (1 of 2) Never done   OPHTHALMOLOGY EXAM  11/14/2020   COVID-19 Vaccine (4 - Booster for Pfizer series) 02/12/2021   FOOT EXAM  01/03/2022   TETANUS/TDAP  04/02/2022 (Originally 11/24/2017)   HEMOGLOBIN A1C  06/09/2022   INFLUENZA VACCINE  06/24/2022   Pneumonia Vaccine 73+ Years old  Completed   HPV VACCINES  Aged Out   Hepatitis C Screening  Discontinued      Immunization History  Administered Date(s) Administered   Fluad Quad(high Dose ) 10/14/2020, 09/07/2021   Influenza  09/01/2012   Influenza, High Dose  09/27/2015, 10/14/2020   Influenza 08/25/2016, 08/24/2018, 09/13/2019   PFIZER Covid-19 Tri-Sucrose Vacc 12/18/2020   PFIZER-SARS-COV-2 Vacc 03/18/2020, 04/13/2020   Pneumococcal -13 12/18/2015   Pneumococcal -23 09/01/2012   Tdap 11/25/2007    Cologard- 11/30/2018 - Negative - Recc f/u    3 years due Jan 2023.   Past Surgical History:  Procedure Laterality Date   AORTIC VALVE REPLACEMENT  07/18/2003   22mm ATS-AP valve   AORTIC VALVE REPLACEMENT (AVR)/CORONARY ARTERY BYPASS GRAFTING (CABG)  07/18/2003   AVR; bypass x 2-SVG to first diag, SVG to posterior descending   CARDIAC CATHETERIZATION  07/13/2003   AS-aortic valve gradient , valve area 1.1; obstruction of ostium of posterior lateral branch with obstruction in the ostium of first diagnonal   HERNIA REPAIR  09/11/2011   RIH, umbilical   NM MYOCAR PERF WALL MOTION  02/12/2010   protocol:Bruce, EF 67%, exercise cap 7 METS, low risk scan   TEE WITHOUT CARDIOVERSION  07/18/2003   no evidence of PE, Aortic valve heavily calcified,    TRANSTHORACIC ECHOCARDIOGRAM  02/21/2013   EF60-65%, Septal motion showed paradox. atrium moderately dilated     Family History  Problem Relation Age of Onset   Stroke Mother    Heart attack Father    Heart disease Father    Cancer Brother    Cancer Sister      Social History   Tobacco Use   Smoking status: Never   Smokeless tobacco: Never  Substance Use Topics   Alcohol use: No   Drug use: No      ROS Constitutional: Denies fever, chills, weight loss/gain, headaches, insomnia,  night sweats or change in appetite. Does c/o fatigue. Eyes: Denies redness, blurred vision, diplopia, discharge, itchy or watery eyes.  ENT: Denies discharge, congestion, post  nasal drip, epistaxis, sore throat, earache, hearing loss, dental  pain, Tinnitus, Vertigo, Sinus pain or snoring.  Cardio: Denies chest pain, palpitations, irregular heartbeat, syncope, dyspnea, diaphoresis, orthopnea, PND, claudication or edema Respiratory: denies cough, dyspnea, DOE, pleurisy, hoarseness, laryngitis or wheezing.  Gastrointestinal: Denies dysphagia, heartburn, reflux, water brash, pain, cramps, nausea, vomiting, bloating, diarrhea, constipation, hematemesis, melena, hematochezia, jaundice or hemorrhoids Genitourinary: Denies dysuria, frequency, discharge, hematuria or flank pain. Has urgency, nocturia x 2-3 & occasional hesitancy. Musculoskeletal: Denies arthralgia, myalgia, stiffness, Jt. Swelling, pain, limp or strain/sprain. Denies Falls. Skin: Denies puritis, rash, hives, warts, acne, eczema or change in skin lesion Neuro: No weakness, tremor, incoordination, spasms, paresthesia or pain Psychiatric: Denies confusion, memory loss or sensory loss. Denies Depression. Endocrine: Denies change in weight, skin, hair change, nocturia, and paresthesia, diabetic polys, visual blurring or hyper / hypo glycemic episodes.  Heme/Lymph: No excessive bleeding, bruising or enlarged lymph nodes.   Physical Exam  BP 130/70   Pulse 63   Temp 97.9 F (36.6 C)   Resp 16   Ht 5\' 8"  (1.727 m)   Wt 200 lb 9.6 oz (91 kg)   SpO2 98%   BMI 30.50 kg/m   General Appearance: Well nourished and well groomed and in no apparent distress.  Eyes: PERRLA, EOMs, conjunctiva no swelling or erythema, normal fundi and vessels. Sinuses: No frontal/maxillary tenderness ENT/Mouth: EACs patent / TMs  nl. Nares clear without erythema, swelling, mucoid exudates. Oral hygiene is good. No erythema, swelling, or exudate. Tongue normal, non-obstructing. Tonsils not swollen or erythematous. Hearing normal.  Neck: Supple, thyroid not palpable. No bruits, nodes or JVD. Respiratory: Respiratory effort normal.  BS equal and clear bilateral without rales, rhonci, wheezing or  stridor. Cardio: Heart sounds are normal with regular rate and rhythm and no murmurs, rubs or gallops. Peripheral pulses are normal and equal bilaterally without edema. No aortic or femoral bruits. Chest: symmetric with normal excursions and percussion.  Abdomen: Soft, with Nl bowel sounds. Nontender, no guarding, rebound, hernias, masses, or organomegaly.  Lymphatics: Non tender without lymphadenopathy.  Musculoskeletal: Full ROM all peripheral extremities, joint stability, 5/5 strength, and normal gait. Skin: Warm and dry without rashes, lesions, cyanosis, clubbing or  ecchymosis.  Neuro: Cranial nerves intact, reflexes equal bilaterally. Normal muscle tone, no cerebellar symptoms. Sensation intact to touch, vibratory and Monofilament to the toes bilaterally. Pysch: Alert and oriented X 3 with normal affect, insight and judgment appropriate.   Assessment and Plan  1. Annual Preventative/Screening Exam    2. Essential hypertension  - EKG 12-Lead - [place], RETROPERITNL ABD,  LTD - Urinalysis, Routine w reflex microscopic - Microalbumin / creatinine urine ratio - CBC with Differential/Platelet - COMPLETE METABOLIC PANEL WITH GFR - Magnesium - TSH   3. Hyperlipidemia associated with type 2 diabetes mellitus (HCC)  - EKG 12-Lead - [place], RETROPERITNL ABD,  LTD - Lipid panel - TSH   4. Type 2 diabetes mellitus with stage 2 chronic kidney                                 disease, without long-term current use of insulin (HCC)  - EKG 12-Lead - [place], RETROPERITNL ABD,  LTD - Urinalysis, Routine w reflex microscopic - Microalbumin / creatinine urine ratio - HM DIABETES FOOT EXAM - PR LOW EXTEMITY NEUR EXAM DOCUM - Hemoglobin A1c - Insulin, random   5. Vitamin D deficiency  - VITAMIN D 25 Hydroxy  6. Coronary artery disease due to lipid rich plaque  - EKG 12-Lead - Lipid panel   7. Hx of CABG  - EKG 12-Lead - Lipid panel   8. H/O mechanical aortic valve replacement  -  EKG 12-Lead   9. 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 BPH with obstruction/lower urinary tract symptoms  - PSA   11. Screening for colorectal cancer  - Cologuard   12. Prostate cancer screening  - PSA   13. Screening for heart disease  - EKG 12-Lead   14. FHx: heart disease  - EKG 12-Lead - [place], RETROPERITNL ABD,  LTD   15. 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 Vitamin B12 deficiency   - Vitamin B12   18. Medication management  - Urinalysis, Routine w reflex microscopic - Microalbumin / creatinine urine ratio - CBC with Differential/Platelet - COMPLETE METABOLIC PANEL WITH GFR - Magnesium - Lipid panel - TSH - Hemoglobin A1c - Insulin, random       Patient was counseled in prudent diet, weight control to achieve/maintain BMI less than 25, BP monitoring, regular exercise and medications as discussed.  Discussed med effects and SE's. Routine screening labs and tests as requested with regular follow-up as recommended. Over 40 minutes of exam, counseling, chart review and high complex critical decision making was performed   Billy Maw, MD

## 2023-05-11 NOTE — Patient Instructions (Signed)
Description   HOLD today's dose and then continue taking warfarin 1/2 tablet daily.   (Prefers 15th & last day of month).  [facility] [phone]

## 2023-05-11 NOTE — Patient Instructions (Signed)

## 2023-05-12 ENCOUNTER — Encounter: Payer: Self-pay | Admitting: Internal Medicine

## 2023-05-12 ENCOUNTER — Ambulatory Visit (INDEPENDENT_AMBULATORY_CARE_PROVIDER_SITE_OTHER): Payer: Medicare Other | Admitting: Internal Medicine

## 2023-05-12 VITALS — BP 130/70 | HR 63 | Temp 97.9°F | Resp 16 | Ht 68.0 in | Wt 200.6 lb

## 2023-05-12 DIAGNOSIS — E1122 Type 2 diabetes mellitus with diabetic chronic kidney disease: Secondary | ICD-10-CM

## 2023-05-12 DIAGNOSIS — N138 Other obstructive and reflux uropathy: Secondary | ICD-10-CM

## 2023-05-12 DIAGNOSIS — N182 Chronic kidney disease, stage 2 (mild): Secondary | ICD-10-CM | POA: Diagnosis not present

## 2023-05-12 DIAGNOSIS — Z125 Encounter for screening for malignant neoplasm of prostate: Secondary | ICD-10-CM

## 2023-05-12 DIAGNOSIS — E559 Vitamin D deficiency, unspecified: Secondary | ICD-10-CM | POA: Diagnosis not present

## 2023-05-12 DIAGNOSIS — E1169 Type 2 diabetes mellitus with other specified complication: Secondary | ICD-10-CM

## 2023-05-12 DIAGNOSIS — I700 Atherosclerosis of aorta: Secondary | ICD-10-CM | POA: Diagnosis not present

## 2023-05-12 DIAGNOSIS — Z79899 Other long term (current) drug therapy: Secondary | ICD-10-CM | POA: Diagnosis not present

## 2023-05-12 DIAGNOSIS — D6869 Other thrombophilia: Secondary | ICD-10-CM

## 2023-05-12 DIAGNOSIS — Z0000 Encounter for general adult medical examination without abnormal findings: Secondary | ICD-10-CM

## 2023-05-12 DIAGNOSIS — Z952 Presence of prosthetic heart valve: Secondary | ICD-10-CM

## 2023-05-12 DIAGNOSIS — I2510 Atherosclerotic heart disease of native coronary artery without angina pectoris: Secondary | ICD-10-CM | POA: Diagnosis not present

## 2023-05-12 DIAGNOSIS — Z8249 Family history of ischemic heart disease and other diseases of the circulatory system: Secondary | ICD-10-CM

## 2023-05-12 DIAGNOSIS — Z0001 Encounter for general adult medical examination with abnormal findings: Secondary | ICD-10-CM

## 2023-05-12 DIAGNOSIS — E538 Deficiency of other specified B group vitamins: Secondary | ICD-10-CM

## 2023-05-12 DIAGNOSIS — I10 Essential (primary) hypertension: Secondary | ICD-10-CM

## 2023-05-12 DIAGNOSIS — Z951 Presence of aortocoronary bypass graft: Secondary | ICD-10-CM

## 2023-05-12 DIAGNOSIS — Z136 Encounter for screening for cardiovascular disorders: Secondary | ICD-10-CM

## 2023-05-12 DIAGNOSIS — E785 Hyperlipidemia, unspecified: Secondary | ICD-10-CM

## 2023-05-12 DIAGNOSIS — Z1211 Encounter for screening for malignant neoplasm of colon: Secondary | ICD-10-CM

## 2023-05-12 DIAGNOSIS — Z7901 Long term (current) use of anticoagulants: Secondary | ICD-10-CM

## 2023-05-13 ENCOUNTER — Other Ambulatory Visit: Payer: Self-pay | Admitting: Internal Medicine

## 2023-05-13 DIAGNOSIS — E1165 Type 2 diabetes mellitus with hyperglycemia: Secondary | ICD-10-CM

## 2023-05-13 DIAGNOSIS — E1122 Type 2 diabetes mellitus with diabetic chronic kidney disease: Secondary | ICD-10-CM

## 2023-05-13 LAB — INSULIN, RANDOM: Insulin: 12.6 u[IU]/mL

## 2023-05-13 LAB — COMPLETE METABOLIC PANEL WITHOUT GFR
AG Ratio: 1.7 (calc) (ref 1.0–2.5)
ALT: 18 U/L (ref 9–46)
AST: 14 U/L (ref 10–35)
Albumin: 3.9 g/dL (ref 3.6–5.1)
Alkaline phosphatase (APISO): 116 U/L (ref 35–144)
BUN: 10 mg/dL (ref 7–25)
CO2: 28 mmol/L (ref 20–32)
Calcium: 9.3 mg/dL (ref 8.6–10.3)
Chloride: 99 mmol/L (ref 98–110)
Creat: 0.83 mg/dL (ref 0.70–1.22)
Globulin: 2.3 g/dL (ref 1.9–3.7)
Glucose, Bld: 496 mg/dL — ABNORMAL HIGH (ref 65–99)
Potassium: 4.6 mmol/L (ref 3.5–5.3)
Sodium: 133 mmol/L — ABNORMAL LOW (ref 135–146)
Total Bilirubin: 1.3 mg/dL — ABNORMAL HIGH (ref 0.2–1.2)
Total Protein: 6.2 g/dL (ref 6.1–8.1)
eGFR: 88 mL/min/{1.73_m2}

## 2023-05-13 LAB — MICROALBUMIN / CREATININE URINE RATIO
Creatinine, Urine: 28 mg/dL (ref 20–320)
Microalb Creat Ratio: 7 mg/g{creat}
Microalb, Ur: 0.2 mg/dL

## 2023-05-13 LAB — URINALYSIS, ROUTINE W REFLEX MICROSCOPIC
Bilirubin Urine: NEGATIVE
Hyaline Cast: NONE SEEN /LPF
Leukocytes,Ua: NEGATIVE
Nitrite: NEGATIVE
Protein, ur: NEGATIVE
RBC / HPF: NONE SEEN /HPF (ref 0–2)
Specific Gravity, Urine: 1.038 — ABNORMAL HIGH (ref 1.001–1.035)
Squamous Epithelial / HPF: NONE SEEN /HPF
pH: 5.5 (ref 5.0–8.0)

## 2023-05-13 LAB — CBC WITH DIFFERENTIAL/PLATELET
Absolute Monocytes: 389 {cells}/uL (ref 200–950)
Basophils Absolute: 30 {cells}/uL (ref 0–200)
Basophils Relative: 0.5 %
Eosinophils Absolute: 83 {cells}/uL (ref 15–500)
Eosinophils Relative: 1.4 %
HCT: 47 % (ref 38.5–50.0)
Hemoglobin: 16.1 g/dL (ref 13.2–17.1)
Lymphs Abs: 1003 {cells}/uL (ref 850–3900)
MCH: 33.9 pg — ABNORMAL HIGH (ref 27.0–33.0)
MCHC: 34.3 g/dL (ref 32.0–36.0)
MCV: 98.9 fL (ref 80.0–100.0)
MPV: 12.1 fL (ref 7.5–12.5)
Monocytes Relative: 6.6 %
Neutro Abs: 4396 {cells}/uL (ref 1500–7800)
Neutrophils Relative %: 74.5 %
Platelets: 132 10*3/uL — ABNORMAL LOW (ref 140–400)
RBC: 4.75 Million/uL (ref 4.20–5.80)
RDW: 12.8 % (ref 11.0–15.0)
Total Lymphocyte: 17 %
WBC: 5.9 10*3/uL (ref 3.8–10.8)

## 2023-05-13 LAB — HEMOGLOBIN A1C
Hgb A1c MFr Bld: 12.7 %{Hb} — ABNORMAL HIGH
Mean Plasma Glucose: 318 mg/dL
eAG (mmol/L): 17.6 mmol/L

## 2023-05-13 LAB — PSA: PSA: <0.04 ng/mL

## 2023-05-13 LAB — TSH: TSH: 1.83 m[IU]/L (ref 0.40–4.50)

## 2023-05-13 LAB — MAGNESIUM: Magnesium: 1.8 mg/dL (ref 1.5–2.5)

## 2023-05-13 LAB — LIPID PANEL
Cholesterol: 143 mg/dL
HDL: 40 mg/dL
LDL Cholesterol (Calc): 78 mg/dL
Non-HDL Cholesterol (Calc): 103 mg/dL
Total CHOL/HDL Ratio: 3.6 (calc)
Triglycerides: 151 mg/dL — ABNORMAL HIGH

## 2023-05-13 LAB — MICROSCOPIC MESSAGE

## 2023-05-13 LAB — VITAMIN D 25 HYDROXY (VIT D DEFICIENCY, FRACTURES): Vit D, 25-Hydroxy: 79 ng/mL (ref 30–100)

## 2023-05-13 LAB — VITAMIN B12: Vitamin B-12: >2000 pg/mL — ABNORMAL HIGH (ref 200–1100)

## 2023-05-13 MED ORDER — TIRZEPATIDE 5 MG/0.5ML ~~LOC~~ SOAJ: SUBCUTANEOUS | 0 refills | Status: DC

## 2023-05-13 NOTE — Progress Notes (Signed)
^<^<^<^<^<^<^<^<^<^<^<^<^<^<^<^<^<^<^<^<^<^<^<^<^<^<^<^<^<^<^<^<^<^<^<^<^ ^>^>^>^>^>^>^>^>^>^>^>>^>^>^>^>^>^>^>^>^>^>^>^>^>^>^>^>^>^>^>^>^>^>^>^>^>  -  Test results slightly outside the reference range are not unusual. If there is anything important, I will review this with you,  otherwise it is considered normal test values.  If you have further questions,  please do not hesitate to contact me at the office or via My Chart.   ^<^<^<^<^<^<^<^<^<^<^<^<^<^<^<^<^<^<^<^<^<^<^<^<^<^<^<^<^<^<^<^<^<^<^<^<^ ^>^>^>^>^>^>^>^>^>^>^>^>^>^>^>^>^>^>^>^>^>^>^>^>^>^>^>^>^>^>^>^>^>^>^>^>^  -  Blood sugar  is 496  mg% - > Dangerously HIGH  !    - A1c = 12.7%  which also is Way too high !    Please be sure taking your Metformin 4 tablets / day                                                                  &  Glipizide 5 mg 3 x /day with Meals   - Will also send in new Rx for Mounjaro shot every 7 days as discussed  &   - please  call after the 4th shot for a refill & also let us know how much weight loss   ^<^<^<^<^<^<^<^<^<^<^<^<^<^<^<^<^<^<^<^<^<^<^<^<^<^<^<^<^<^<^<^<^<^<^<^<^ ^>^>^>^>^>^>^>^>^>^>^>^>^>^>^>^>^>^>^>^>^>^>^>^>^>^>^>^>^>^>^>^>^>^>^>^>^  - Vitamin B12 is very elevated , So may cut down to only 1 x /week   ^<^<^<^<^<^<^<^<^<^<^<^<^<^<^<^<^<^<^<^<^<^<^<^<^<^<^<^<^<^<^<^<^<^<^<^<^ ^>^>^>^>^>^>^>^>^>^>^>^>^>^>^>^>^>^>^>^>^>^>^>^>^>^>^>^>^>^>^>^>^>^>^>^>^  - PSA is very low - essentially untedectible- =Great !   ^<^<^<^<^<^<^<^<^<^<^<^<^<^<^<^<^<^<^<^<^<^<^<^<^<^<^<^<^<^<^<^<^<^<^<^<^ ^>^>^>^>^>^>^>^>^>^>^>^>^>^>^>^>^>^>^>^>^>^>^>^>^>^>^>^>^>^>^>^>^>^>^>^>^  -  Magnesium = 1.8   which is  very  low- goal is betw 2.0 - 2.5,   - So..............Marland Kitchen  Recommend that you take                                        Magnesium 500 mg tablet  3 x /day with Meals   - also important to eat lots of  leafy green vegetables   - spinach - Kale - collards - greens - okra - asparagus - broccoli -  quinoa - squash   - black, red, white beans - almonds -  peas - green beans  ^<^<^<^<^<^<^<^<^<^<^<^<^<^<^<^<^<^<^<^<^<^<^<^<^<^<^<^<^<^<^<^<^<^<^<^<^ ^>^>^>^>^>^>^>^>^>^>^>^>^>^>^>^>^>^>^>^>^>^>^>^>^>^>^>^>^>^>^>^>^>^>^>^>^  -Vitamin D = 79 - Excellent  - Please continue dose same   ^<^<^<^<^<^<^<^<^<^<^<^<^<^<^<^<^<^<^<^<^<^<^<^<^<^<^<^<^<^<^<^<^<^<^<^<^ ^>^>^>^>^>^>^>^>^>^>^>^>^>^>^>^>^>^>^>^>^>^>^>^>^>^>^>^>^>^>^>^>^>^>^>^>^  - All Else - CBC - Kidneys - Electrolytes - Liver - Magnesium & Thyroid    - all  Normal / OK  ^<^<^<^<^<^<^<^<^<^<^<^<^<^<^<^<^<^<^<^<^<^<^<^<^<^<^<^<^<^<^<^<^<^<^<^<^ ^>^>^>^>^>^>^>^>^>^>^>^>^>^>^>^>^>^>^>^>^>^>^>^>^>^>^>^>^>^>^>^>^>^>^>^>^

## 2023-05-16 ENCOUNTER — Encounter: Payer: Self-pay | Admitting: Internal Medicine

## 2023-05-20 NOTE — Progress Notes (Signed)
 Assessment and Plan:  Billy Reilly was seen today for acute visit.  Diagnoses and all orders for this visit:  Essential hypertension - continue medications, DASH diet, exercise and monitor at home. Call if greater than 130/80.   Chronic atrial fibrillation (HCC) Continued Atrial Fib on Coumadin  and Metoprolol  -     EKG 12-Lead  Shortness of breath T wave inversion on EKG Suspect cardiac cause of shortness of breath, lungs clear- discussed with Dr. Tonita and concurred pt should go to ER to be evaluated -     EKG 12-Lead       Further disposition pending results of labs. Discussed med's effects and SE's.   Over 30 minutes of exam, counseling, chart review, and critical decision making was performed.   Future Appointments  Date Time Provider Department Center  06/15/2023  3:00 PM Mona Vinie BROCKS, MD [facility] None  08/19/2023  9:30 AM Tonita Fallow, MD [facility] None  11/30/2023 11:00 AM Laurice President, NP [facility] None  03/01/2024  9:30 AM Tonita Fallow, MD [facility] None  06/01/2024 10:00 AM Tonita Fallow, MD [facility] None    ------------------------------------------------------------------------------------------------------------------   HPI BP 122/72   Pulse 76   Temp 97.9 F (36.6 C)   Ht 5' 8 (1.727 m)   Wt 196 lb (88.9 kg)   SpO2 97%   BMI 29.80 kg/m   82 y.o.male presents for evaluation of shortness of breath  with walking and fatigue x 2 days.  Has difficulty walking from car to house.  He has no fever, body aches or coughing.  States he had similar symptoms in the past with pneumonia.    BP has been controlled on Enalapril  20 mg 2 tabs daily, Metoprolol  50 mg 1 1/2 tabs daily BP Readings from Last 3 Encounters:  05/21/23 122/72  05/12/23 130/70  01/01/23 128/74   He does have chronic a fib and is on Metoprolol  50 mg 1 and 1/2 tab daily and coumadin  5 mg- 1/2 tab daily - followed through coumadin  clinic.  BMI is Body mass index is 29.8  kg/m., he has not been working on diet and exercise. Wt Readings from Last 3 Encounters:  05/21/23 196 lb (88.9 kg)  05/12/23 200 lb 9.6 oz (91 kg)  01/01/23 204 lb 6.4 oz (92.7 kg)     Past Medical History:  Diagnosis Date   Anemia    AS (aortic stenosis)    valve replacement-st. jude mechanical valve   ASHD (arteriosclerotic heart disease)    CAD (coronary artery disease), native coronary artery    CABG 07/18/2003   Cancer (HCC)    prostate   GERD (gastroesophageal reflux disease)    History of nuclear stress test 02/12/10   EF 67%, normal perfusion   Hyperlipidemia    Hypertension    Morbid obesity (HCC)    Type II or unspecified type diabetes mellitus without mention of complication, not stated as uncontrolled    Umbilical hernia    Vitamin B12 deficiency    Vitamin D  deficiency      No Known Allergies  Current Outpatient Medications on File Prior to Visit  Medication Sig   aspirin 81 MG tablet Take 81 mg by mouth daily.   Cholecalciferol  125 MCG (5000 UT) CHEW Chew 5,000 Units by mouth daily. (Patient taking differently: Chew 5,000 Units by mouth daily. 15,000 units)   CINNAMON PO Take 500 mg by mouth in the morning and at bedtime.    clotrimazole -betamethasone  (LOTRISONE ) cream Apply to rash 2 to  3 x /day   Cyanocobalamin  (B-12 SL) Place under the tongue daily.   enalapril  (VASOTEC ) 20 MG tablet Take 2 tablets (40 mg total) by mouth daily.   famotidine  (PEPCID ) 20 MG tablet TAKE 1 TABLET BY MOUTH TWICE A DAY WITH MEALS FOR ACIDE INDIGESTION   glipiZIDE  (GLUCOTROL ) 10 MG tablet TAKE 1 TABLET BY MOUTH 3 TIMES A DAY BEFORE MEALS FOR DIABETES   glucose blood (ONE TOUCH ULTRA TEST) test strip CHECK BLOOD SUGAR ONCE DAILY   metFORMIN  (GLUCOPHAGE -XR) 500 MG 24 hr tablet TAKE 2 TABLETS TWICE A DAY WITH MEALS FOR DIABETES   metoprolol  succinate (TOPROL -XL) 50 MG 24 hr tablet TAKE 1 AND 1/2 TABLETS DAILY (75 MG TOTAL) BY MOUTH WITH FOOD OR IMMEDIATELY AFTER MEALS    rosuvastatin  (CRESTOR ) 10 MG tablet TAKE 1 TABLET BY MOUTH EVERY DAY   tirzepatide  (MOUNJARO ) 5 MG/0.5ML Pen Inject  1 pen (5 mg)  into Skin  every 7 days  for Diabetes  ( e11.29)   warfarin (COUMADIN ) 5 MG tablet TAKE 1/2 TO 1 TABLET BY MOUTH DAILY OR AS DIRECTED   No current facility-administered medications on file prior to visit.    ROS: all negative except above.   Physical Exam:  BP 122/72   Pulse 76   Temp 97.9 F (36.6 C)   Ht 5' 8 (1.727 m)   Wt 196 lb (88.9 kg)   SpO2 97%   BMI 29.80 kg/m   General Appearance: Well nourished, pale, no shortness of breath at rest Eyes: PERRLA, EOMs, conjunctiva no swelling or erythema Sinuses: No Frontal/maxillary tenderness ENT/Mouth: Ext aud canals clear, TMs without erythema, bulging. No erythema, swelling, or exudate on post pharynx.  Tonsils not swollen or erythematous. Hearing normal.  Neck: Supple, thyroid  normal.  Respiratory: Respiratory effort normal, BS equal bilaterally without rales, rhonchi, wheezing or stridor.  Cardio: Irregularly irregular with no MRGs. Brisk peripheral pulses without edema.  Abdomen: Soft, + BS.  Non tender, no guarding, rebound, hernias, masses. Lymphatics: Non tender without lymphadenopathy.  Musculoskeletal: Full ROM, 5/5 strength, normal gait.  Skin: Warm, dry without rashes, lesions, ecchymosis.  Neuro: Cranial nerves intact. Normal muscle tone, no cerebellar symptoms. Sensation intact.  Psych: Awake and oriented X 3, normal affect, Insight and Judgment appropriate.  EKG: Atrial fibrillation, T wave inversion  Jayan Raymundo E Tarisa Paola, NP 4:22 PM [facility]

## 2023-05-21 ENCOUNTER — Emergency Department (HOSPITAL_BASED_OUTPATIENT_CLINIC_OR_DEPARTMENT_OTHER): Payer: Medicare Other | Admitting: Radiology

## 2023-05-21 ENCOUNTER — Encounter: Payer: Self-pay | Admitting: Nurse Practitioner

## 2023-05-21 ENCOUNTER — Encounter (HOSPITAL_BASED_OUTPATIENT_CLINIC_OR_DEPARTMENT_OTHER): Payer: Self-pay | Admitting: Emergency Medicine

## 2023-05-21 ENCOUNTER — Other Ambulatory Visit: Payer: Self-pay

## 2023-05-21 ENCOUNTER — Observation Stay (HOSPITAL_BASED_OUTPATIENT_CLINIC_OR_DEPARTMENT_OTHER)
Admission: EM | Admit: 2023-05-21 | Discharge: 2023-05-23 | Disposition: A | Discharge status: Home / Self Care | Payer: Medicare Other | Attending: Internal Medicine | Admitting: Internal Medicine

## 2023-05-21 ENCOUNTER — Ambulatory Visit (INDEPENDENT_AMBULATORY_CARE_PROVIDER_SITE_OTHER): Payer: Medicare Other | Admitting: Nurse Practitioner

## 2023-05-21 VITALS — BP 122/72 | HR 76 | Temp 97.9°F | Ht 68.0 in | Wt 196.0 lb

## 2023-05-21 DIAGNOSIS — Z7901 Long term (current) use of anticoagulants: Secondary | ICD-10-CM | POA: Insufficient documentation

## 2023-05-21 DIAGNOSIS — K449 Diaphragmatic hernia without obstruction or gangrene: Secondary | ICD-10-CM | POA: Insufficient documentation

## 2023-05-21 DIAGNOSIS — E1121 Type 2 diabetes mellitus with diabetic nephropathy: Secondary | ICD-10-CM | POA: Insufficient documentation

## 2023-05-21 DIAGNOSIS — Z8546 Personal history of malignant neoplasm of prostate: Secondary | ICD-10-CM | POA: Insufficient documentation

## 2023-05-21 DIAGNOSIS — I482 Chronic atrial fibrillation, unspecified: Secondary | ICD-10-CM

## 2023-05-21 DIAGNOSIS — K922 Gastrointestinal hemorrhage, unspecified: Principal | ICD-10-CM | POA: Diagnosis present

## 2023-05-21 DIAGNOSIS — I10 Essential (primary) hypertension: Secondary | ICD-10-CM | POA: Diagnosis not present

## 2023-05-21 DIAGNOSIS — Z952 Presence of prosthetic heart valve: Secondary | ICD-10-CM | POA: Diagnosis not present

## 2023-05-21 DIAGNOSIS — Z7982 Long term (current) use of aspirin: Secondary | ICD-10-CM | POA: Diagnosis not present

## 2023-05-21 DIAGNOSIS — E1122 Type 2 diabetes mellitus with diabetic chronic kidney disease: Secondary | ICD-10-CM | POA: Diagnosis present

## 2023-05-21 DIAGNOSIS — I2510 Atherosclerotic heart disease of native coronary artery without angina pectoris: Secondary | ICD-10-CM | POA: Diagnosis not present

## 2023-05-21 DIAGNOSIS — I129 Hypertensive chronic kidney disease with stage 1 through stage 4 chronic kidney disease, or unspecified chronic kidney disease: Secondary | ICD-10-CM | POA: Insufficient documentation

## 2023-05-21 DIAGNOSIS — D696 Thrombocytopenia, unspecified: Secondary | ICD-10-CM | POA: Diagnosis not present

## 2023-05-21 DIAGNOSIS — K317 Polyp of stomach and duodenum: Secondary | ICD-10-CM | POA: Insufficient documentation

## 2023-05-21 DIAGNOSIS — R195 Other fecal abnormalities: Secondary | ICD-10-CM

## 2023-05-21 DIAGNOSIS — N182 Chronic kidney disease, stage 2 (mild): Secondary | ICD-10-CM | POA: Diagnosis not present

## 2023-05-21 DIAGNOSIS — D649 Anemia, unspecified: Secondary | ICD-10-CM

## 2023-05-21 DIAGNOSIS — D62 Acute posthemorrhagic anemia: Secondary | ICD-10-CM | POA: Insufficient documentation

## 2023-05-21 DIAGNOSIS — R0602 Shortness of breath: Secondary | ICD-10-CM

## 2023-05-21 DIAGNOSIS — Z7984 Long term (current) use of oral hypoglycemic drugs: Secondary | ICD-10-CM | POA: Diagnosis not present

## 2023-05-21 DIAGNOSIS — I4821 Permanent atrial fibrillation: Secondary | ICD-10-CM | POA: Insufficient documentation

## 2023-05-21 DIAGNOSIS — Z951 Presence of aortocoronary bypass graft: Secondary | ICD-10-CM | POA: Diagnosis not present

## 2023-05-21 DIAGNOSIS — I771 Stricture of artery: Secondary | ICD-10-CM | POA: Diagnosis not present

## 2023-05-21 DIAGNOSIS — E1165 Type 2 diabetes mellitus with hyperglycemia: Secondary | ICD-10-CM | POA: Insufficient documentation

## 2023-05-21 DIAGNOSIS — I4891 Unspecified atrial fibrillation: Secondary | ICD-10-CM | POA: Diagnosis present

## 2023-05-21 DIAGNOSIS — I517 Cardiomegaly: Secondary | ICD-10-CM | POA: Diagnosis not present

## 2023-05-21 LAB — CBC
HCT: 34.2 % — ABNORMAL LOW (ref 39.0–52.0)
Hemoglobin: 12.2 g/dL — ABNORMAL LOW (ref 13.0–17.0)
MCH: 34.1 pg — ABNORMAL HIGH (ref 26.0–34.0)
MCHC: 35.7 g/dL (ref 30.0–36.0)
MCV: 95.5 fL (ref 80.0–100.0)
Platelets: 139 10*3/uL — ABNORMAL LOW (ref 150–400)
RBC: 3.58 MIL/uL — ABNORMAL LOW (ref 4.22–5.81)
RDW: 14.4 % (ref 11.5–15.5)
WBC: 5 10*3/uL (ref 4.0–10.5)
nRBC: 0 % (ref 0.0–0.2)

## 2023-05-21 LAB — TROPONIN I (HIGH SENSITIVITY)
Troponin I (High Sensitivity): 10 ng/L
Troponin I (High Sensitivity): 11 ng/L

## 2023-05-21 LAB — BASIC METABOLIC PANEL WITH GFR
Anion gap: 10 (ref 5–15)
BUN: 20 mg/dL (ref 8–23)
CO2: 22 mmol/L (ref 22–32)
Calcium: 9.1 mg/dL (ref 8.9–10.3)
Chloride: 104 mmol/L (ref 98–111)
Creatinine, Ser: 0.79 mg/dL (ref 0.61–1.24)
GFR, Estimated: >60 mL/min
Glucose, Bld: 250 mg/dL — ABNORMAL HIGH (ref 70–99)
Potassium: 3.7 mmol/L (ref 3.5–5.1)
Sodium: 136 mmol/L (ref 135–145)

## 2023-05-21 LAB — OCCULT BLOOD X 1 CARD TO LAB, STOOL: Fecal Occult Bld: POSITIVE — AB

## 2023-05-21 LAB — PROTIME-INR
INR: 2.8 — ABNORMAL HIGH (ref 0.8–1.2)
Prothrombin Time: 29.5 s — ABNORMAL HIGH (ref 11.4–15.2)

## 2023-05-21 LAB — BRAIN NATRIURETIC PEPTIDE: B Natriuretic Peptide: 116.1 pg/mL — ABNORMAL HIGH (ref 0.0–100.0)

## 2023-05-21 MED ORDER — PANTOPRAZOLE SODIUM 40 MG IV SOLR
40.0000 mg | Freq: Once | INTRAVENOUS | Status: AC
  Administered 2023-05-21: 40 mg via INTRAVENOUS
  Filled 2023-05-21: qty 10

## 2023-05-21 NOTE — ED Notes (Signed)
Patient states he gets SOB when ambulating and moving. States this is different and doesn't have home oxygen or anything

## 2023-05-21 NOTE — ED Provider Notes (Signed)
[place] EMERGENCY DEPARTMENT AT [facility] Provider Note   CSN: [id number] Arrival date & time: 05/21/23  1728     History  Chief Complaint  Patient presents with   Shortness of Breath    Billy Reilly is a 82 y.o. male.  He has a history of coronary disease CABG mechanical aortic valve replacement.  Currently on Coumadin.  Sent in from his primary care doctor's office today with 2 days of dyspnea on exertion and fatigue.  He feels well at rest.  It is not associated with any cough fever chest pain abdominal pain vomiting diarrhea.  His last INR check was a week ago and was good.  He saw his primary care doctor who noted a abnormality on his EKG and sent him here for further cardiac evaluation.  He follows with Dr. Rennis Golden from cardiology.  He had a nuclear stress test in 2014 that was low risk.  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  The history is provided by the patient.  Shortness of Breath Severity:  Moderate Onset quality:  Gradual Duration:  2 days Timing:  Intermittent Progression:  Unchanged Chronicity:  New Context: activity   Relieved by:  Rest Worsened by:  Activity Ineffective treatments:  None tried Associated symptoms: no abdominal pain, no chest pain, no cough, no diaphoresis, no fever, no hemoptysis, no sputum production and no vomiting   Risk factors: no hx of PE/DVT and no tobacco use        Home Medications Prior to Admission medications   Medication Sig Start Date End Date Taking? Authorizing Provider  aspirin 81 MG tablet Take 81 mg by mouth daily.    [provider]  Cholecalciferol 125 MCG (5000 UT) CHEW Chew 5,000 Units by mouth daily. Patient taking differently: Chew 5,000 Units by mouth daily. 15,000 units 12/10/21   Judd Gaudier, NP  CINNAMON PO Take 500 mg by mouth in the morning and at bedtime.     [provider]  clotrimazole-betamethasone  (LOTRISONE) cream Apply to rash 2 to 3 x /day 06/19/22   Adela Glimpse, NP  Cyanocobalamin (B-12 SL) Place under the tongue daily.    [provider]  enalapril (VASOTEC) 20 MG tablet Take 2 tablets (40 mg total) by mouth daily. 12/31/22   Chrystie Nose, MD  famotidine (PEPCID) 20 MG tablet TAKE 1 TABLET BY MOUTH TWICE A DAY WITH MEALS FOR ACIDE INDIGESTION 11/03/22   Raynelle Dick, NP  glipiZIDE (GLUCOTROL) 10 MG tablet TAKE 1 TABLET BY MOUTH 3 TIMES A DAY BEFORE MEALS FOR DIABETES 11/03/22   Raynelle Dick, NP  glucose blood (ONE TOUCH ULTRA TEST) test strip CHECK BLOOD SUGAR ONCE DAILY 10/29/21   Judd Gaudier, NP  metFORMIN (GLUCOPHAGE-XR) 500 MG 24 hr tablet TAKE 2 TABLETS TWICE A DAY WITH MEALS FOR DIABETES 11/23/22   [name], MD  metoprolol succinate (TOPROL-XL) 50 MG 24 hr tablet TAKE 1 AND 1/2 TABLETS DAILY (75 MG TOTAL) BY MOUTH WITH FOOD OR IMMEDIATELY AFTER MEALS 12/19/22   Hilty, Lisette Abu, MD  rosuvastatin (CRESTOR) 10 MG tablet TAKE 1 TABLET BY MOUTH EVERY DAY 03/04/22   Judd Gaudier, NP  tirzepatide [facility]) 5 MG/0.5ML Pen Inject  1 pen (5 mg)  into Skin  every 7 days  for Diabetes  ( e11.29) 05/13/23   [name], MD  warfarin (COUMADIN) 5 MG tablet TAKE 1/2  TO 1 TABLET BY MOUTH DAILY OR AS DIRECTED 03/05/22   Hilty, Lisette Abu, MD      Allergies    Patient has no known allergies.    Review of Systems   Review of Systems  Constitutional:  Negative for diaphoresis and fever.  Respiratory:  Positive for shortness of breath. Negative for cough, hemoptysis and sputum production.   Cardiovascular:  Negative for chest pain.  Gastrointestinal:  Negative for abdominal pain and vomiting.    Physical Exam Updated Vital Signs BP (!) 156/86 (BP Location: Right Arm)   Pulse (!) 120   Temp 98 F (36.7 C) (Oral)   Resp 17  Physical Exam Vitals and nursing note reviewed.  Constitutional:      General: He is not in acute distress.    Appearance:  Normal appearance. He is well-developed.  HENT:     Head: Normocephalic and atraumatic.  Eyes:     Conjunctiva/sclera: Conjunctivae normal.  Cardiovascular:     Rate and Rhythm: Normal rate. Rhythm irregular.     Heart sounds: No murmur heard.    Comments: Mechanical click Pulmonary:     Effort: Pulmonary effort is normal. No respiratory distress.     Breath sounds: Normal breath sounds.  Abdominal:     Palpations: Abdomen is soft.     Tenderness: There is no abdominal tenderness. There is no guarding or rebound.  Musculoskeletal:        General: No deformity.     Cervical back: Neck supple.     Right lower leg: No edema.     Left lower leg: No edema.  Skin:    General: Skin is warm and dry.     Capillary Refill: Capillary refill takes less than 2 seconds.  Neurological:     General: No focal deficit present.     Mental Status: He is alert.     Sensory: No sensory deficit.     Motor: No weakness.     ED Results / Procedures / Treatments   Labs (all labs ordered are listed, but only abnormal results are displayed) Labs Reviewed  BASIC METABOLIC PANEL - Abnormal; Notable for the following components:      Result Value   Glucose, Bld 250 (*)    All other components within normal limits  CBC - Abnormal; Notable for the following components:   RBC 3.58 (*)    Hemoglobin 12.2 (*)    HCT 34.2 (*)    MCH 34.1 (*)    Platelets 139 (*)    All other components within normal limits  BRAIN NATRIURETIC PEPTIDE - Abnormal; Notable for the following components:   B Natriuretic Peptide 116.1 (*)    All other components within normal limits  PROTIME-INR - Abnormal; Notable for the following components:   Prothrombin Time 29.5 (*)    INR 2.8 (*)    All other components within normal limits  OCCULT BLOOD X 1 CARD TO LAB, STOOL - Abnormal; Notable for the following components:   Fecal Occult Bld POSITIVE (*)    All other components within normal limits  BASIC METABOLIC PANEL -  Abnormal; Notable for the following components:   CO2 21 (*)    Glucose, Bld 253 (*)    All other components within normal limits  CBC - Abnormal; Notable for the following components:   RBC 3.27 (*)    Hemoglobin 11.3 (*)    HCT 32.5 (*)    MCH 34.6 (*)  Platelets 125 (*)    All other components within normal limits  PROTIME-INR - Abnormal; Notable for the following components:   Prothrombin Time 27.2 (*)    INR 2.5 (*)    All other components within normal limits  GLUCOSE, CAPILLARY - Abnormal; Notable for the following components:   Glucose-Capillary 211 (*)    All other components within normal limits  GLUCOSE, CAPILLARY - Abnormal; Notable for the following components:   Glucose-Capillary 232 (*)    All other components within normal limits  TSH  BRAIN NATRIURETIC PEPTIDE  CBC  TROPONIN I (HIGH SENSITIVITY)  TROPONIN I (HIGH SENSITIVITY)    EKG EKG Interpretation Date/Time:  Thursday May 21 2023 17:42:49 EDT Ventricular Rate:  83 PR Interval:    QRS Duration:  86 QT Interval:  368 QTC Calculation: 432 R Axis:   -6  Text Interpretation: Atrial fibrillation Minimal voltage criteria for LVH, may be normal variant ( R in aVL ) Anterior infarct , age undetermined ST & T wave abnormality, consider lateral ischemia Abnormal ECG When compared with ECG of 19-Jul-2003 07:27, Atrial fibrillation has replaced Sinus rhythm Anterior infarct is now Present Criteria for Inferior infarct are no longer Present T wave inversion more evident in Lateral leads Confirmed by Meridee Score [phone]) on 05/21/2023 5:47:08 PM  Radiology DG Chest 2 View  Result Date: 05/21/2023 CLINICAL DATA:  Shortness of breath EXAM: CHEST - 2 VIEW COMPARISON:  Previous studies including chest radiograph done on 09/04/2015 and CT done on 06/30/2022 FINDINGS: Transverse diameter of heart is increased. Thoracic aorta is tortuous. There is previous CABG. There is a prosthetic cardiac valve. There are no signs of  pulmonary edema or focal pulmonary consolidation. There is fixed hiatal hernia. There is no pleural effusion or pneumothorax. IMPRESSION: Cardiomegaly. There are no signs of pulmonary edema or focal pulmonary consolidation. Hiatal hernia. Electronically Signed   By: Ernie Avena M.D.   On: 05/21/2023 18:02    Procedures Procedures    Medications Ordered in ED Medications  ondansetron (ZOFRAN) tablet 4 mg (has no administration in time range)    Or  ondansetron (ZOFRAN) injection 4 mg (has no administration in time range)  acetaminophen (TYLENOL) tablet 650 mg (has no administration in time range)    Or  acetaminophen (TYLENOL) suppository 650 mg (has no administration in time range)  insulin aspart (novoLOG) injection 0-9 Units (3 Units Subcutaneous Given 05/22/23 0853)  pantoprazole (PROTONIX) 80 mg /NS 100 mL IVPB (has no administration in time range)  pantoprozole (PROTONIX) 80 mg /NS 100 mL infusion (has no administration in time range)  pantoprazole (PROTONIX) injection 40 mg (has no administration in time range)  lactated ringers infusion (has no administration in time range)  pantoprazole (PROTONIX) injection 40 mg (40 mg Intravenous Given 05/21/23 2216)    ED Course/ Medical Decision Making/ A&P Clinical Course as of 05/22/23 0921  Thu May 21, 2023  1805 Chest x-ray interpreted by me as cardiomegaly no gross failure or infiltrate.  Awaiting radiology reading. [MB]  2104 She now recalls that he has been having dark stools for the last 6 days since he started his Bongiorno injection.  He is having some abdominal pain.  Rectal exam done.  Stool sent to lab for guaiac. [MB]  2157 Discussed with Dr. Lorenso Quarry from [facility] who said they would consult on him when he gets to [facility]. [MB]    Clinical Course User Index [MB] Terrilee Files, MD  Medical Decision Making Amount and/or Complexity of Data Reviewed Labs: ordered. Radiology:  ordered.  Risk Prescription drug management. Decision regarding hospitalization.   This patient complains of shortness of breath dyspnea on exertion; this involves an extensive number of treatment Options and is a complaint that carries with it a high risk of complications and morbidity. The differential includes ACS, pneumonia, anemia, failure, vascular, PE  I ordered, reviewed and interpreted labs, which included CBC with normal white count, hemoglobin lower than priors, chemistries with elevated glucose, INR therapeutic, fecal occult positive, troponin flat I ordered medication IV Protonix and reviewed PMP when indicated. I ordered imaging studies which included chest x-ray and I independently    visualized and interpreted imaging which showed cardiomegaly no gross infiltrate Previous records obtained and reviewed in epic including prior cardiology notes I consulted Dr. Arm[facility] and Dr. Antionette Char [facility] hospitalist and discussed lab and imaging findings and discussed disposition.  Cardiac monitoring reviewed, normal sinus rhythm Social determinants considered, no significant barriers Critical Interventions: None  After the interventions stated above, I reevaluated the patient and found patient to be hemodynamically stable and fairly asymptomatic at rest Admission and further testing considered, due to him being guaiac positive with symptomatic anemia on Coumadin feel he would benefit from admission to the hospital for further GI and cardiology input.  Patient in agreement with plan for admission.         Final Clinical Impression(s) / ED Diagnoses Final diagnoses:  Acute GI bleeding  Symptomatic anemia    Rx / DC Orders ED Discharge Orders     None         Terrilee Files, MD 05/22/23 [phone]

## 2023-05-21 NOTE — ED Notes (Signed)
Patient transported to X-ray 

## 2023-05-21 NOTE — Progress Notes (Signed)
Plan of Care Note for accepted transfer   Patient: Billy Reilly MRN: [id number]   DOA: 05/21/2023  Facility requesting transfer: [facility]  Requesting Provider: Dr. Charm Barges   Reason for transfer: DOE, GI bleeding   Facility course: 82 yr old man with hx of HTN, DM, CAD, and PAF on warfarin who presents with DOE and reports several days of dark stool.   INR is 2.8, Hgb 12.2 (16.1 on June 18th), and FOBT is positive.   GI (Dr. Lorenso Quarry) was consulted by ED MD and pt was given IV PPI and IVF.    Plan of care: The patient is accepted for admission to Telemetry unit, at [facility].   Author: Briscoe Deutscher, MD 05/21/2023  Check [email or web address] for on-call coverage.  Nursing staff, Please call [facility] Admits & Consults System-Wide number on Amion as soon as patient's arrival, so appropriate admitting provider can evaluate the pt.

## 2023-05-21 NOTE — ED Notes (Signed)
Report given to Veronica, RN.

## 2023-05-21 NOTE — Patient Instructions (Signed)
Shortness of Breath, Adult Shortness of breath is when a person has trouble breathing or when a person feels like she or he is having trouble breathing in enough air. Shortness of breath could be a sign of a medical problem. Follow these instructions at home:  Pollutants Do not use any products that contain nicotine or tobacco. These products include cigarettes, chewing tobacco, and vaping devices, such as e-cigarettes. This also includes cigars and pipes. If you need help quitting, ask your health care provider. Avoid things that can irritate your airways, including: Smoke. This includes campfire smoke, forest fire smoke, and secondhand smoke from tobacco products. Do not smoke or allow others to smoke in your home. Mold. Dust. Air pollution. Chemical fumes. Things that can give you an allergic reaction (allergens) if you have allergies. Common allergens include pollen from grasses or trees and animal dander. Keep your living space clean and free of mold and dust. General instructions Pay attention to any changes in your symptoms. Take over-the-counter and prescription medicines only as told by your health care provider. This includes oxygen therapy and inhaled medicines. Rest as needed. Return to your normal activities as told by your health care provider. Ask your health care provider what activities are safe for you. Keep all follow-up visits. This is important. Contact a health care provider if: Your condition does not improve as soon as expected. You have a hard time doing your normal activities, even after you rest. You have new symptoms. You cannot walk up stairs or exercise the way that you normally do. Get help right away if: Your shortness of breath gets worse. You have shortness of breath when you are resting. You feel light-headed or you faint. You have a cough that is not controlled with medicines. You cough up blood. You have pain with breathing. You have pain in your  chest, arms, shoulders, or abdomen. You have a fever. These symptoms may be an emergency. Get help right away. Call 911. Do not wait to see if the symptoms will go away. Do not drive yourself to the hospital. Summary Shortness of breath is when a person has trouble breathing enough air. It can be a sign of a medical problem. Avoid things that irritate your lungs, such as smoking, pollution, mold, and dust. Pay attention to changes in your symptoms and contact your health care provider if you have a hard time completing daily activities because of shortness of breath. This information is not intended to replace advice given to you by your health care provider. Make sure you discuss any questions you have with your health care provider. Document Revised: 06/29/2021 Document Reviewed: 06/29/2021 Elsevier Patient Education  2024 [employer]  

## 2023-05-21 NOTE — ED Triage Notes (Signed)
Pt sent by his PCP.  Saw him today for shob and increased work of breathing with activity.  Pt states he had pna in the past and that's how he felt then.  PCP did an EKG and sent pt here for eval.  No chest pain.  No n/v/d.

## 2023-05-22 ENCOUNTER — Inpatient Hospital Stay (HOSPITAL_COMMUNITY): Payer: Medicare Other | Admitting: Anesthesiology

## 2023-05-22 ENCOUNTER — Encounter (HOSPITAL_COMMUNITY)
Admission: EM | Disposition: A | Discharge status: Home / Self Care | Payer: Self-pay | Source: Home / Self Care | Attending: Emergency Medicine

## 2023-05-22 ENCOUNTER — Encounter (HOSPITAL_COMMUNITY): Payer: Self-pay | Admitting: Internal Medicine

## 2023-05-22 DIAGNOSIS — D62 Acute posthemorrhagic anemia: Secondary | ICD-10-CM | POA: Diagnosis not present

## 2023-05-22 DIAGNOSIS — I4891 Unspecified atrial fibrillation: Secondary | ICD-10-CM | POA: Diagnosis not present

## 2023-05-22 DIAGNOSIS — E1121 Type 2 diabetes mellitus with diabetic nephropathy: Secondary | ICD-10-CM

## 2023-05-22 DIAGNOSIS — I2510 Atherosclerotic heart disease of native coronary artery without angina pectoris: Secondary | ICD-10-CM | POA: Diagnosis not present

## 2023-05-22 DIAGNOSIS — Z7901 Long term (current) use of anticoagulants: Secondary | ICD-10-CM

## 2023-05-22 DIAGNOSIS — Z952 Presence of prosthetic heart valve: Secondary | ICD-10-CM | POA: Diagnosis not present

## 2023-05-22 DIAGNOSIS — I25810 Atherosclerosis of coronary artery bypass graft(s) without angina pectoris: Secondary | ICD-10-CM

## 2023-05-22 DIAGNOSIS — D500 Iron deficiency anemia secondary to blood loss (chronic): Secondary | ICD-10-CM | POA: Diagnosis not present

## 2023-05-22 DIAGNOSIS — I4811 Longstanding persistent atrial fibrillation: Secondary | ICD-10-CM | POA: Diagnosis not present

## 2023-05-22 DIAGNOSIS — K317 Polyp of stomach and duodenum: Secondary | ICD-10-CM

## 2023-05-22 DIAGNOSIS — K449 Diaphragmatic hernia without obstruction or gangrene: Secondary | ICD-10-CM

## 2023-05-22 DIAGNOSIS — K922 Gastrointestinal hemorrhage, unspecified: Principal | ICD-10-CM | POA: Diagnosis present

## 2023-05-22 DIAGNOSIS — N182 Chronic kidney disease, stage 2 (mild): Secondary | ICD-10-CM | POA: Diagnosis not present

## 2023-05-22 DIAGNOSIS — E1122 Type 2 diabetes mellitus with diabetic chronic kidney disease: Secondary | ICD-10-CM | POA: Diagnosis not present

## 2023-05-22 DIAGNOSIS — K921 Melena: Secondary | ICD-10-CM | POA: Diagnosis not present

## 2023-05-22 HISTORY — PX: ESOPHAGOGASTRODUODENOSCOPY (EGD) WITH PROPOFOL: SHX5813

## 2023-05-22 LAB — CBC
HCT: 32.5 % — ABNORMAL LOW (ref 39.0–52.0)
HCT: 32.3 % — ABNORMAL LOW (ref 39.0–52.0)
Hemoglobin: 11.3 g/dL — ABNORMAL LOW (ref 13.0–17.0)
Hemoglobin: 11.1 g/dL — ABNORMAL LOW (ref 13.0–17.0)
MCH: 34.6 pg — ABNORMAL HIGH (ref 26.0–34.0)
MCH: 34.6 pg — ABNORMAL HIGH (ref 26.0–34.0)
MCHC: 34.8 g/dL (ref 30.0–36.0)
MCHC: 34.4 g/dL (ref 30.0–36.0)
MCV: 99.4 fL (ref 80.0–100.0)
MCV: 100.6 fL — ABNORMAL HIGH (ref 80.0–100.0)
Platelets: 125 10*3/uL — ABNORMAL LOW (ref 150–400)
Platelets: 121 10*3/uL — ABNORMAL LOW (ref 150–400)
RBC: 3.27 MIL/uL — ABNORMAL LOW (ref 4.22–5.81)
RBC: 3.21 MIL/uL — ABNORMAL LOW (ref 4.22–5.81)
RDW: 14.5 % (ref 11.5–15.5)
RDW: 14.6 % (ref 11.5–15.5)
WBC: 4.7 10*3/uL (ref 4.0–10.5)
WBC: 5 10*3/uL (ref 4.0–10.5)
nRBC: 0 % (ref 0.0–0.2)
nRBC: 0 % (ref 0.0–0.2)

## 2023-05-22 LAB — BRAIN NATRIURETIC PEPTIDE: B Natriuretic Peptide: 84.4 pg/mL (ref 0.0–100.0)

## 2023-05-22 LAB — PROTIME-INR
INR: 2.5 — ABNORMAL HIGH (ref 0.8–1.2)
Prothrombin Time: 27.2 s — ABNORMAL HIGH (ref 11.4–15.2)

## 2023-05-22 LAB — BASIC METABOLIC PANEL WITH GFR
Anion gap: 10 (ref 5–15)
BUN: 20 mg/dL (ref 8–23)
CO2: 21 mmol/L — ABNORMAL LOW (ref 22–32)
Calcium: 8.9 mg/dL (ref 8.9–10.3)
Chloride: 104 mmol/L (ref 98–111)
Creatinine, Ser: 0.82 mg/dL (ref 0.61–1.24)
GFR, Estimated: >60 mL/min
Glucose, Bld: 253 mg/dL — ABNORMAL HIGH (ref 70–99)
Potassium: 3.8 mmol/L (ref 3.5–5.1)
Sodium: 135 mmol/L (ref 135–145)

## 2023-05-22 LAB — GLUCOSE, CAPILLARY
Glucose-Capillary: 166 mg/dL — ABNORMAL HIGH (ref 70–99)
Glucose-Capillary: 178 mg/dL — ABNORMAL HIGH (ref 70–99)
Glucose-Capillary: 211 mg/dL — ABNORMAL HIGH (ref 70–99)
Glucose-Capillary: 214 mg/dL — ABNORMAL HIGH (ref 70–99)
Glucose-Capillary: 231 mg/dL — ABNORMAL HIGH (ref 70–99)
Glucose-Capillary: 232 mg/dL — ABNORMAL HIGH (ref 70–99)

## 2023-05-22 LAB — TSH: TSH: 2.108 u[IU]/mL (ref 0.350–4.500)

## 2023-05-22 SURGERY — ESOPHAGOGASTRODUODENOSCOPY (EGD) WITH PROPOFOL
Anesthesia: Monitor Anesthesia Care

## 2023-05-22 MED ORDER — PROPOFOL 10 MG/ML IV BOLUS
INTRAVENOUS | Status: DC | PRN
  Administered 2023-05-22 (×3): 50 mg via INTRAVENOUS

## 2023-05-22 MED ORDER — LACTATED RINGERS IV SOLN: INTRAVENOUS | Status: DC | PRN

## 2023-05-22 MED ORDER — PANTOPRAZOLE SODIUM 40 MG PO TBEC
40.0000 mg | DELAYED_RELEASE_TABLET | Freq: Every day | ORAL | Status: DC
  Administered 2023-05-22 – 2023-05-23 (×2): 40 mg via ORAL
  Filled 2023-05-22 (×2): qty 1

## 2023-05-22 MED ORDER — ACETAMINOPHEN 325 MG PO TABS: 650.0000 mg | ORAL_TABLET | Freq: Four times a day (QID) | ORAL | Status: DC | PRN

## 2023-05-22 MED ORDER — METOPROLOL SUCCINATE ER 50 MG PO TB24
50.0000 mg | ORAL_TABLET | Freq: Every day | ORAL | Status: DC
  Administered 2023-05-22 – 2023-05-23 (×2): 50 mg via ORAL
  Filled 2023-05-22 (×2): qty 1

## 2023-05-22 MED ORDER — ACETAMINOPHEN 650 MG RE SUPP: 650.0000 mg | Freq: Four times a day (QID) | RECTAL | Status: DC | PRN

## 2023-05-22 MED ORDER — CHOLECALCIFEROL 125 MCG (5000 UT) PO CHEW: 5000.0000 [IU] | CHEWABLE_TABLET | Freq: Every day | ORAL | Status: DC

## 2023-05-22 MED ORDER — ONDANSETRON HCL 4 MG/2ML IJ SOLN: 4.0000 mg | Freq: Four times a day (QID) | INTRAMUSCULAR | Status: DC | PRN

## 2023-05-22 MED ORDER — LIDOCAINE HCL (CARDIAC) PF 100 MG/5ML IV SOSY
PREFILLED_SYRINGE | INTRAVENOUS | Status: DC | PRN
  Administered 2023-05-22: 80 mg via INTRATRACHEAL

## 2023-05-22 MED ORDER — PANTOPRAZOLE 80MG IVPB - SIMPLE MED
80.0000 mg | Freq: Once | INTRAVENOUS | Status: AC
  Administered 2023-05-22: 80 mg via INTRAVENOUS
  Filled 2023-05-22: qty 80

## 2023-05-22 MED ORDER — PANTOPRAZOLE SODIUM 40 MG IV SOLR: 40.0000 mg | Freq: Two times a day (BID) | INTRAVENOUS | Status: DC

## 2023-05-22 MED ORDER — PHENYLEPHRINE HCL (PRESSORS) 10 MG/ML IV SOLN
INTRAVENOUS | Status: DC | PRN
  Administered 2023-05-22 (×2): 80 ug via INTRAVENOUS

## 2023-05-22 MED ORDER — ONDANSETRON HCL 4 MG PO TABS: 4.0000 mg | ORAL_TABLET | Freq: Four times a day (QID) | ORAL | Status: DC | PRN

## 2023-05-22 MED ORDER — ROSUVASTATIN CALCIUM 10 MG PO TABS
10.0000 mg | ORAL_TABLET | Freq: Every day | ORAL | Status: DC
  Administered 2023-05-22 – 2023-05-23 (×2): 10 mg via ORAL
  Filled 2023-05-22 (×2): qty 1

## 2023-05-22 MED ORDER — PANTOPRAZOLE INFUSION (NEW) - SIMPLE MED
8.0000 mg/h | INTRAVENOUS | Status: DC
  Administered 2023-05-22: 8 mg/h via INTRAVENOUS
  Filled 2023-05-22: qty 80

## 2023-05-22 MED ORDER — LACTATED RINGERS IV SOLN: INTRAVENOUS | Status: DC

## 2023-05-22 MED ORDER — INSULIN ASPART 100 UNIT/ML IJ SOLN
0.0000 [IU] | INTRAMUSCULAR | Status: DC
  Administered 2023-05-22: 3 [IU] via SUBCUTANEOUS
  Administered 2023-05-22: 2 [IU] via SUBCUTANEOUS
  Administered 2023-05-22 – 2023-05-23 (×3): 3 [IU] via SUBCUTANEOUS
  Administered 2023-05-23 (×2): 2 [IU] via SUBCUTANEOUS

## 2023-05-22 SURGICAL SUPPLY — 14 items
BLOCK BITE 60FR ADLT L/F BLUE (MISCELLANEOUS) ×1
ELECT REM PT RETURN 9FT ADLT (ELECTROSURGICAL)
FORCEP RJ3 GP 1.8X160 W-NEEDLE (CUTTING FORCEPS)
FORCEPS BIOP RAD 4 LRG CAP 4 (CUTTING FORCEPS)
NDL SCLEROTHERAPY 25GX240 (NEEDLE) IMPLANT
NEEDLE SCLEROTHERAPY 25GX240 (NEEDLE)
PROBE APC STR FIRE (PROBE)
PROBE INJECTION GOLD (MISCELLANEOUS)
PROBE INJECTION GOLD 7FR (MISCELLANEOUS)
SNARE SHORT THROW 13M SML OVAL (MISCELLANEOUS)
SYR 50ML LL SCALE MARK (SYRINGE)
TUBING ENDO SMARTCAP PENTAX (MISCELLANEOUS) ×2
TUBING IRRIGATION ENDOGATOR (MISCELLANEOUS) ×1
WATER STERILE IRR 1000ML POUR (IV SOLUTION)

## 2023-05-22 NOTE — Consult Note (Addendum)
Cardiology Consultation   Patient ID: Billy Reilly MRN: [id number]; DOB: 10/31/1941  Admit date: 05/21/2023 Date of Consult: 05/22/2023  PCP:  [name], MD   [facility] Providers Cardiologist:  Chrystie Nose, MD   {    Patient Profile:   Billy Reilly is a 82 y.o. male with a hx of CAD s/p 2 vessels CABG ( SVG-diag 1, SVG-PDA) 2004, aortic stenosis s/p St Jude mechanical aortica valve replacement 2004, HTN, HLD, type 2 DM, permanent atrial fibrillation, prostate cancer, AAA, who is being seen 05/22/2023 for the evaluation of chronic anticogulation therapy at the request of Dr. Gasper Sells.   History of Present Illness:   Billy Reilly with above PMH presented to ER on 05/21/23 for SOB on exertion and few days of tarry stools. He endorses fatigue and difficulty with regular activity at home over the past 2 days. He thought he had pneumonia as symptoms are similar to what he had in the past. He went to see his PCP where he was sent to ER for further evaluation. He reports starting tirzepatide injection for DM a week ago, had some abdominal pain and poor PO intake for 2-3 days after receiving his first injection. He noted having black tarry stools as well, which had stopped 48 hours ago. He felt his abdominal pain and appetite is better now. He was in normal state of health until a week ago. He denied having any chest pain, SOB, syncope, leg edema, weight gain at home. He is able to maintain his lawn care and perform household chores regularly without cardiac symptoms. He has been taking ASA 81mg  and coumadin daily.    Admission diagnostic 05/21/23 showed glucose 250, Hgb 12.2, PLT 139k, INR 2.8. FOBT +. CXR no acute finding. Hs trop negative x2 (10 >11). EKG showed A fib with ventricular rate of 83bpm, old lateral TWI. Repeat labs today showed glucose 253, bicarb 21, Hgb 11.3, INR 2.5. TSH WNL. He has remained hemodynamically stable, admitted to hospitalist service, started IV PPI  and made NPO, waiting for GI consult. Cardiology is consulted today for further input on chronic anticoagulation therapy.   Per chart review, patient historically follows Dr Rennis Golden for CAD. He underwent 2v CABG (SVG-diag 1, SVG-PDA) along with mechanical aortic valve replacement for aortic stenosis in 2004. Most recent ischemic evaluation was done 01/30/15 with exercise stress myoview, which showed good exercise capacity, low risk study with small amount of lateral artifact. No significant reversible ischemia. LVEF 56%. He had done well since his CABG and AVR, remains on coumadin for anticoagulation, had not been having any angina or CHF symptoms. He was diagnosed with permanent atrial flutter/fibrillation since Jan 2021. Echo from 12/2019 showed LVEFF 65-70%, severe LAE. 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 Rate control strategy was pursued for PAF given significant LAE and being asymptomatic with A fib. He was maintained on metoprolol XL 50mg  daily. He was last seen by Dr Rennis Golden 10/21/22 in the office, feeling well without cardiac complaints or symptoms. CTA chest 06/30/22 showed stable ascending thoracic aorta measures 4 cm. Repeat Echo 04/15/23 showed LVEF 65-70%, no RWMA, indeterminate diastolic parameters, mildly reduced RV, normal PASP with RVSP 29.4 mmHg. Mild LAE. Trivial MR. Trivial AI. S/p aortic valve replacement, mean gradient . Ascending aorta dilation 44mm.    Past Medical History:  Diagnosis Date   Anemia  AS (aortic stenosis)    valve replacement-st. jude mechanical valve   ASHD (arteriosclerotic heart disease)    CAD (coronary artery disease), native coronary artery    CABG 07/18/2003   Cancer (HCC)    prostate   GERD (gastroesophageal reflux disease)    History of nuclear stress test 02/12/10   EF 67%, normal perfusion   Hyperlipidemia     Hypertension    Morbid obesity (HCC)    Type II or unspecified type diabetes mellitus without mention of complication, not stated as uncontrolled    Umbilical hernia    Vitamin B12 deficiency    Vitamin D deficiency     Past Surgical History:  Procedure Laterality Date   AORTIC VALVE REPLACEMENT  07/18/2003   22mm ATS-AP valve   AORTIC VALVE REPLACEMENT (AVR)/CORONARY ARTERY BYPASS GRAFTING (CABG)  07/18/2003   AVR; bypass x 2-SVG to first diag, SVG to posterior descending   CARDIAC CATHETERIZATION  07/13/2003   AS-aortic valve gradient , valve area 1.1; obstruction of ostium of posterior lateral branch with obstruction in the ostium of first diagnonal   HERNIA REPAIR  09/11/2011   RIH, umbilical   NM MYOCAR PERF WALL MOTION  02/12/2010   protocol:Bruce, EF 67%, exercise cap 7 METS, low risk scan   TEE WITHOUT CARDIOVERSION  07/18/2003   no evidence of PE, Aortic valve heavily calcified,    TRANSTHORACIC ECHOCARDIOGRAM  02/21/2013   EF60-65%, Septal motion showed paradox. atrium moderately dilated     Home Medications:  Prior to Admission medications   Medication Sig Start Date End Date Taking? Authorizing Provider  aspirin 81 MG tablet Take 81 mg by mouth daily.    [provider]  Cholecalciferol 125 MCG (5000 UT) CHEW Chew 5,000 Units by mouth daily. Patient taking differently: Chew 5,000 Units by mouth daily. 15,000 units 12/10/21   Judd Gaudier, NP  CINNAMON PO Take 500 mg by mouth in the morning and at bedtime.     [provider]  clotrimazole-betamethasone (LOTRISONE) cream Apply to rash 2 to 3 x /day 06/19/22   Adela Glimpse, NP  Cyanocobalamin (B-12 SL) Place under the tongue daily.    [provider]  enalapril (VASOTEC) 20 MG tablet Take 2 tablets (40 mg total) by mouth daily. 12/31/22   Chrystie Nose, MD  famotidine (PEPCID) 20 MG tablet TAKE 1 TABLET BY MOUTH TWICE A DAY WITH MEALS FOR ACIDE INDIGESTION 11/03/22   Raynelle Dick, NP  glipiZIDE (GLUCOTROL) 10 MG tablet TAKE 1 TABLET BY MOUTH 3 TIMES A DAY BEFORE MEALS FOR DIABETES 11/03/22   Raynelle Dick, NP  glucose blood (ONE TOUCH ULTRA TEST) test strip CHECK BLOOD SUGAR ONCE DAILY 10/29/21   Judd Gaudier, NP  metFORMIN (GLUCOPHAGE-XR) 500 MG 24 hr tablet TAKE 2 TABLETS TWICE A DAY WITH MEALS FOR DIABETES 11/23/22   [name], MD  metoprolol succinate (TOPROL-XL) 50 MG 24 hr tablet TAKE 1 AND 1/2 TABLETS DAILY (75 MG TOTAL) BY MOUTH WITH FOOD OR IMMEDIATELY AFTER MEALS 12/19/22   Hilty, Lisette Abu, MD  rosuvastatin (CRESTOR) 10 MG tablet TAKE 1 TABLET BY MOUTH EVERY DAY 03/04/22   Judd Gaudier, NP  tirzepatide [facility]) 5 MG/0.5ML Pen Inject  1 pen (5 mg)  into Skin  every 7 days  for Diabetes  ( e11.29) 05/13/23   [name], MD  warfarin (COUMADIN) 5 MG tablet TAKE 1/2 TO 1 TABLET BY MOUTH DAILY OR AS DIRECTED 03/05/22   Hilty, Iantha Fallen  C, MD    Inpatient Medications: Scheduled Meds:  insulin aspart  0-9 Units Subcutaneous Q4H   Continuous Infusions:  lactated ringers 75 mL/hr at 05/22/23 0125   PRN Meds: acetaminophen **OR** acetaminophen, ondansetron **OR** ondansetron (ZOFRAN) IV  Allergies:   No Known Allergies  Social History:   Social History   Socioeconomic History   Marital status: Widowed    Spouse name: Not on file   Number of children: Not on file   Years of education: Not on file   Highest education level: Not on file  Occupational History   Not on file  Tobacco Use   Smoking status: Never   Smokeless tobacco: Never  Substance and Sexual Activity   Alcohol use: No   Drug use: No   Sexual activity: Not on file  Other Topics Concern   Not on file  Social History Narrative   Not on file   Social Determinants of Health   Financial Resource Strain: Not on file  Food Insecurity: No Food Insecurity (05/22/2023)   Hunger Vital Sign    Worried About Running Out of Food in the Last Year: Never true    Ran Out of  Food in the Last Year: Never true  Transportation Needs: No Transportation Needs (05/22/2023)   PRAPARE - Administrator, Civil Service (Medical): No    Lack of Transportation (Non-Medical): No  Physical Activity: Not on file  Stress: Not on file  Social Connections: Not on file  Intimate Partner Violence: Not At Risk (05/22/2023)   Humiliation, Afraid, Rape, and Kick questionnaire    Fear of Current or Ex-Partner: No    Emotionally Abused: No    Physically Abused: No    Sexually Abused: No    Family History:    Family History  Problem Relation Age of Onset   Stroke Mother    Heart attack Father    Heart disease Father    Cancer Brother    Cancer Sister      ROS:  Constitutional: fatigue  Eyes: Denied vision change or loss Ears/Nose/Mouth/Throat: Denied ear ache, sore throat, coughing, sinus pain Cardiovascular: see HPI  Respiratory:see HPI  Gastrointestinal: Denied nausea, vomiting, abdominal pain, diarrhea Genital/Urinary: Denied dysuria, hematuria, urinary frequency/urgency Musculoskeletal: Denied muscle ache, joint pain, weakness Skin: Denied rash, wound Neuro: Denied headache, dizziness, syncope Psych: Denied history of depression/anxiety  Endocrine: history of diabetes   Physical Exam/Data:   Vitals:   05/21/23 2330 05/21/23 2345 05/22/23 0049 05/22/23 0448  BP: 129/69 126/60 (!) 159/89 129/69  Pulse: 67 91 91 63  Resp: 17 17 18 18   Temp:   97.9 F (36.6 C) 98.1 F (36.7 C)  TempSrc:   Oral   SpO2: 99% 98% 97% 99%  Weight:   86.8 kg   Height:   5\' 8"  (1.727 m)     Intake/Output Summary (Last 24 hours) at 05/22/2023 0846 Last data filed at 05/22/2023 0600 Gross per 24 hour  Intake 343.14 ml  Output 450 ml  Net -106.86 ml      05/22/2023   12:49 AM 05/21/2023    4:02 PM 05/12/2023    9:13 AM  Last 3 Weights  Weight (lbs) 191 lb 5.8 oz 196 lb 200 lb 9.6 oz  Weight (kg) 86.8 kg 88.905 kg 90.992 kg     Body mass index is 29.1 kg/m.    Vitals:  Vitals:   05/22/23 0049 05/22/23 0448  BP: (!) 159/89 129/69  Pulse: 91  63  Resp: 18 18  Temp: 97.9 F (36.6 C) 98.1 F (36.7 C)  SpO2: 97% 99%   General Appearance: In no apparent distress, laying in bed, well nourished  HEENT: Normocephalic, atraumatic.  Neck: Supple, trachea midline, no JVDs Cardiovascular: Irregularly irregular, soft systolic murmur at RUSB, + mechanical click  Respiratory: Resting breathing unlabored, lungs sounds clear to auscultation bilaterally, no use of accessory muscles. On room air.  No wheezes, rales or rhonchi.   Gastrointestinal: Bowel sounds positive, abdomen soft, non-distended  Extremities: Able to move all extremities in bed without difficulty, trace edema of BLE  Musculoskeletal: Normal muscle bulk and tone Skin: Intact, warm, dry. No rashes or petechiae noted in exposed areas.  Neurologic: Alert, oriented to person, place and time. Fluent speech, no cognitive deficit,  no gross focal neuro deficit Psychiatric: Normal affect. Mood is appropriate.    EKG:  The EKG was personally reviewed and demonstrates:    EKG from 05/21/23 showed A fib with ventricular rate of 83 bpm, old lateral TWI, no significant change   EKG from today pending    Telemetry:  Telemetry was personally reviewed and demonstrates:    A fib with ventricular rate of 70-80s, occasional PVCs   Relevant CV Studies:   Echo 04/15/23:   1. Left ventricular ejection fraction, by estimation, is 65 to 70%. The  left ventricle has normal function. The left ventricle has no regional  wall motion abnormalities. Left ventricular diastolic parameters are  indeterminate.   2. Right ventricular systolic function is mildly reduced. The right  ventricular size is mildly enlarged. There is normal pulmonary artery  systolic pressure. The estimated right ventricular systolic pressure is  29.4 mmHg.   3. Left atrial size was mildly dilated.   4. The mitral valve is normal in  structure. Trivial mitral valve  regurgitation. No evidence of mitral stenosis.   5. The aortic valve has been repaired/replaced. Aortic valve  regurgitation is trivial. There is a 22 mm St. Jude mechanical valve  present in the aortic position. Procedure Date: 07/18/2003. Vmax 2.8 m/s,  EOA 1.9 cm^2, DI 0.47. MG , stable from  prior echo 12/2019   6. Aortic dilatation noted. There is dilatation of the ascending aorta,  measuring 44 mm.   7. The inferior vena cava is normal in size with greater than 50%  respiratory variability, suggesting right atrial pressure of 3 mmHg.    Echo from 12/27/19:   1. Left ventricular ejection fraction, by visual estimation, is 65 to  70%. The left ventricle has hyperdynamic function. There is mildly  increased left ventricular hypertrophy.   2. Left ventricular diastolic function could not be evaluated.   3. The left ventricle has no regional wall motion abnormalities.   4. Global right ventricle has normal systolic function.The right  ventricular size is normal. No increase in right ventricular wall  thickness.   5. Left atrial size was severely dilated.   6. Right atrial size was moderately dilated.   7. The mitral valve is abnormal. Trivial mitral valve regurgitation.   8. The tricuspid valve is grossly normal.   9. The tricuspid valve is grossly normal. Tricuspid valve regurgitation  is trivial.  10. Aortic valve area, by VTI measures 1.62 cm.  11. Aortic valve mean gradient measures 18.0 mmHg.  12. Aortic valve peak gradient measures 31.9 mmHg.  13. Aortic valve regurgitation is trivial.  14. Mechanical prosthesis in the aortic valve position.  15. The pulmonic valve was  grossly normal. Pulmonic valve regurgitation is  not visualized.  16. Aortic dilatation noted.  17. There is moderate dilatation of the ascending aorta measuring 44 mm.  18. The inferior vena cava is normal in size with greater than 50%  respiratory variability,  suggesting right atrial pressure of 3 mmHg.  19. Changes from prior study are noted.  20. Previous EF was 55-60%, mechanical AVR gradient of 20 mmHg mean, aorta  was not dilated.  21. Moderately dilated ascending aorta, newly noted - recommend CT  aortogram in 6 months.    Exercise myoview 01/30/15:   Impression Exercise Capacity:  Good exercise capacity. BP Response:  Normal blood pressure response. Clinical Symptoms:  There is dyspnea. ECG Impression:  No significant ST segment change suggestive of ischemia. Comparison with Prior Nuclear Study: No significant change from previous study   Overall Impression:  Low risk stress nuclear study with small amount of lateral artifact. No significant reversible ischemia.   LV Wall Motion:  NL LV Function; NL Wall Motion; EF 56%    Laboratory Data:  High Sensitivity Troponin:   Recent Labs  Lab 05/21/23 1819 05/21/23 2220  TROPONINIHS 10 11     Chemistry Recent Labs  Lab 05/21/23 1819 05/22/23 0130  NA 136 135  K 3.7 3.8  CL 104 104  CO2 22 21*  GLUCOSE 250* 253*  BUN 20 20  CREATININE 0.79 0.82  CALCIUM 9.1 8.9  GFRNONAA >60 >60  ANIONGAP 10 10    No results for input(s): "PROT", "ALBUMIN", "AST", "ALT", "ALKPHOS", "BILITOT" in the last 168 hours. Lipids No results for input(s): "CHOL", "TRIG", "HDL", "LABVLDL", "LDLCALC", "CHOLHDL" in the last 168 hours.  Hematology Recent Labs  Lab 05/21/23 1819 05/22/23 0130  WBC 5.0 4.7  RBC 3.58* 3.27*  HGB 12.2* 11.3*  HCT 34.2* 32.5*  MCV 95.5 99.4  MCH 34.1* 34.6*  MCHC 35.7 34.8  RDW 14.4 14.5  PLT 139* 125*   Thyroid  Recent Labs  Lab 05/22/23 0130  TSH 2.108    BNP Recent Labs  Lab 05/21/23 1819 05/22/23 0130  BNP 116.1* 84.4    DDimer No results for input(s): "DDIMER" in the last 168 hours.   Radiology/Studies:  DG Chest 2 View  Result Date: 05/21/2023 CLINICAL DATA:  Shortness of breath EXAM: CHEST - 2 VIEW COMPARISON:  Previous studies including  chest radiograph done on 09/04/2015 and CT done on 06/30/2022 FINDINGS: Transverse diameter of heart is increased. Thoracic aorta is tortuous. There is previous CABG. There is a prosthetic cardiac valve. There are no signs of pulmonary edema or focal pulmonary consolidation. There is fixed hiatal hernia. There is no pleural effusion or pneumothorax. IMPRESSION: Cardiomegaly. There are no signs of pulmonary edema or focal pulmonary consolidation. Hiatal hernia. Electronically Signed   By: Ernie Avena M.D.   On: 05/21/2023 18:02     Assessment and Plan:   History of aortic stenosis s/p St Jude mechanical aortic valve replacement 07/18/2003 - admitted with GI bleed, pending GI consult for clearance of use of anticoagulation  - unable use anticoagulation at this time due to acute blood loss anemia 2/2 GI bleed, would resume as soon as circumstance allows  - Echo 03/2023 with preserved LVEF and functioning aortic valve, no CHF symptoms at home, DOE at admission due to anemia   Permanent A fib/flutter  - rate controlled, asymptomatic, resume PTA metoprolol once PO intake allows  - anticoagulation held at this time due to GI bleed, resume upon  GI clearance    CAD with CABG 2004 - stable with angina symptoms - transfuse if Hgb <8 - Hold ASA until GI clearance, resume PTA metoprolol and enalapril and crestor once PO intake allows   AAA - stable on CTA chest 06/2022, BP controlled, resume antihypertensive and crestor once PO intake allows, continue annual surveillance     Risk Assessment/Risk Scores:   New York Heart Association (NYHA) Functional Class NYHA Class I  CHA2DS2-VASc Score = 6  This indicates a 9.7% annual risk of stroke. The patient's score is based upon: CHF History: 1 HTN History: 1 Diabetes History: 1 Stroke History: 0 Vascular Disease History: 1 Age Score: 2 Gender Score: 0     For questions or updates, please contact [facility] Please consult  [email or web address] for contact info under    Signed, Cyndi Bender, NP  05/22/2023 8:46 AM  I have seen and examined the patient along with Cyndi Bender, NP .  I have reviewed the chart, notes and new data.  I agree with PA/NP's note.  Key new complaints: no complaints at rest. His presenting complaint was dyspnea with mild exertion, that began after he had melena. He was only able to walk a few feet without dyspnea, but now he walked a lap around the unit without problems Key examination changes: crisp prosthetic valve clicks Key new findings / data: INR 2.5; Hgb dropped from 16-17 baseline to 11-12; borderline thrombocytopenia (chronic). Normal BNP and renal function. ECG - AFib rate controlled, LVH. Recent echo reviewed - mild elevation in mean gradient across AVR (17 mm Hg) is chronic (was 19 mm Hg in 2014).  PLAN: Hold warfarin until GI source of bleeding is identified and hopefully corrected. The risk of thromboembolic complications is generally low if interruption of anticoagulation is brief (less than 5-7 days).   Billy Fair, MD, [facility] [phone] 05/22/2023, 9:52 AM

## 2023-05-22 NOTE — Plan of Care (Signed)
  Problem: Pain Managment: Goal: General experience of comfort will improve Outcome: Progressing   Problem: Safety: Goal: Ability to remain free from injury will improve Outcome: Progressing   Problem: Skin Integrity: Goal: Risk for impaired skin integrity will decrease Outcome: Progressing   

## 2023-05-22 NOTE — Op Note (Signed)
[facility] Patient Name: Billy Reilly Procedure Date: 05/22/2023 MRN: [id number] Attending MD: Vida Rigger , MD, [id number] Date of Birth: 11/01/1941 CSN: [id number] Age: 82 Admit Type: Outpatient Procedure:                Upper GI endoscopy Indications:              Acute post hemorrhagic anemia, Melena Providers:                Vida Rigger, MD, Marge Duncans, RN, Adin Hector,                            RN, Kandice Robinsons, Technician Referring MD:              Medicines:                Monitored Anesthesia Care Complications:            No immediate complications. Estimated Blood Loss:     Estimated blood loss: none. Procedure:                Pre-Anesthesia Assessment:                           - Prior to the procedure, a History and Physical                            was performed, and patient medications and                            allergies were reviewed. The patient's tolerance of                            previous anesthesia was also reviewed. The risks                            and benefits of the procedure and the sedation                            options and risks were discussed with the patient.                            All questions were answered, and informed consent                            was obtained. Prior Anticoagulants: The patient has                            taken Coumadin (warfarin), last dose was 2 days                            prior to procedure. ASA Grade Assessment: III - A                            patient with severe systemic disease. After  reviewing the risks and benefits, the patient was                            deemed in satisfactory condition to undergo the                            procedure.                           After obtaining informed consent, the endoscope was                            passed under direct vision. Throughout the                            procedure, the patient's  blood pressure, pulse, and                            oxygen saturations were monitored continuously. The                            [id number]-[id number] ([id number]) Olympus endoscope was introduced                            through the mouth, and advanced to the third part                            of duodenum. The upper GI endoscopy was                            accomplished without difficulty. The patient                            tolerated the procedure well. Scope In: Scope Out: Findings:      A medium-sized hiatal hernia was present.      A few small semi-sessile polyps with no stigmata of recent bleeding were       found in the gastric body.      The duodenal bulb, first portion of the duodenum, second portion of the       duodenum and third portion of the duodenum were normal.      The exam was otherwise without abnormality. Impression:               - Medium-sized hiatal hernia.                           - A few gastric polyps.                           - Normal duodenal bulb, first portion of the                            duodenum, second portion of the duodenum and third  portion of the duodenum.                           - The examination was otherwise normal.                           - No specimens collected. Moderate Sedation:      Not Applicable - Patient had care per Anesthesia. Recommendation:           - Soft diet today. Hopefully he can go home soon                            maybe even this afternoon                           - Continue present medications. Can resume Coumadin                            tomorrow and reevaluate aspirin needs but try to                            hold it for 1 more week if allowed                           - Return to GI clinic PRN. Happy to see back as                            needed but no further workup needed at this time                           - Telephone GI clinic if symptomatic PRN. Procedure Code(s):         --- Professional ---                           [phone], Esophagogastroduodenoscopy, flexible,                            transoral; diagnostic, including collection of                            specimen(s) by brushing or washing, when performed                            (separate procedure) Diagnosis Code(s):        --- Professional ---                           K44.9, Diaphragmatic hernia without obstruction or                            gangrene                           K31.7, Polyp of stomach and duodenum  D62, Acute posthemorrhagic anemia                           K92.1, Melena (includes Hematochezia) CPT copyright 2022 American Medical Association. All rights reserved. The codes documented in this report are preliminary and upon coder review may  be revised to meet current compliance requirements. Vida Rigger, MD 05/22/2023 12:25:25 PM This report has been signed electronically. Number of Addenda: 0

## 2023-05-22 NOTE — Plan of Care (Signed)

## 2023-05-22 NOTE — Anesthesia Postprocedure Evaluation (Signed)
Anesthesia Post Note  Patient: Billy Reilly  Procedure(s) Performed: ESOPHAGOGASTRODUODENOSCOPY (EGD) WITH PROPOFOL     Patient location during evaluation: Endoscopy Anesthesia Type: MAC Level of consciousness: awake and alert Pain management: pain level controlled Vital Signs Assessment: post-procedure vital signs reviewed and stable Respiratory status: spontaneous breathing, nonlabored ventilation, respiratory function stable and patient connected to nasal cannula oxygen Cardiovascular status: stable and blood pressure returned to baseline Postop Assessment: no apparent nausea or vomiting Anesthetic complications: no   No notable events documented.  Last Vitals:  Vitals:   05/22/23 1310 05/22/23 1320  BP: (!) 152/67 (!) 153/50  Pulse: (!) 102 67  Resp: (!) 21 (!) 21  Temp:    SpO2: 97% 97%    Last Pain:  Vitals:   05/22/23 1320  TempSrc:   PainSc: 0-No pain                 Earl Lites P Blakely Gluth

## 2023-05-22 NOTE — Progress Notes (Signed)
   05/22/23 1348  TOC Brief Assessment  Insurance and Status Reviewed  Patient has primary care physician Yes  Home environment has been reviewed home alone  Prior level of function: independent  Prior/Current Home Services No current home services  Social Determinants of Health Reivew SDOH reviewed no interventions necessary  Readmission risk has been reviewed Yes  Transition of care needs no transition of care needs at this time

## 2023-05-22 NOTE — Progress Notes (Addendum)
PROGRESS NOTE   Billy Reilly  ZOX:[id number]    DOB: 04-20-1941    DOA: 05/21/2023  PCP: [name], MD   I have briefly reviewed patients previous medical records in [facility].  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  Admitted for suspected acute upper GI bleed.  [facility] and cardiology consulted.   Assessment & Plan:  Principal Problem:   GI bleed Active Problems:   Long term current use of anticoagulant therapy   H/O mechanical aortic valve replacement   Type II diabetes mellitus with nephropathy (HCC)   CKD stage 2 due to type 2 diabetes mellitus (HCC)   Atrial fibrillation (HCC)   Occult GI bleeding   Suspected acute upper GI bleed: History as noted above, black tarry stools for 3 to 4 days since Sunday gone.  No BM in the last 2 days. Complicated by ongoing anticoagulation with Coumadin for mechanical AVR and permanent A-fib. Hemodynamically stable. Acute blood loss anemia with hemoglobin having dropped from baseline 16-17 to 11.3, symptomatic of same.  INR 2.5. Single dose of Protonix IV given in ED but not continued.  Started IV Protonix infusion. Holding Coumadin and aspirin pending GI clearance. GI consulted, input pending, will likely need EGD. Patient reports that he last did Cologuard several years ago and no further workup due to advanced age.  May not need colonoscopy this admission.  EGD appreciated, appeared unremarkable without source of bleeding.  GI recommends resuming  Coumadin tomorrow, hold aspirin for a week and no further procedures planned.  Acute posthemorrhagic anemia: Secondary to GI bleed. Hemoglobin dropped from baseline 16-17 in February and early June 2024-11.3. Monitor CBCs closely.  Suspect the GI bleed has abated clinically. Transfuse for hemoglobin 8 g or less.  Permanent A-fib/flutter Cardiology input appreciated. Rate controlled and asymptomatic. Consider resuming metoprolol postprocedure and anticoagulation when cleared by GI.  St Jude mechanical aortic valve replacement 07/18/2003 Anticoagulation decision as above.  CAD s/p CABG Cardiology input appreciated.  Report stable with angina symptoms but patient denied chest pain to me. Transfuse if hemoglobin 8 g or less. Holding aspirin until GI clearance. Consider resuming home dose of metoprolol, enalapril and Crestor post possible EGD.  AAA Stable on CTA chest 8/23 per cardiology note. Continue statins and aspirin's when able.  Hypertension: Currently controlled off of meds.  Resume above meds when able.  Type II DM with hyperglycemia: CBGs in the 200s. Holding p.o. meds. Started NovoLog sensitive SSI.  Adjust insulins as needed. PCP may need to reassess if patient can really tolerate the Mounjaro.  Thrombocytopenia Chronic, mild and intermittent.  Stable.  Follow CBCs.  Stage II CKD Stable.  Body mass index is 29.1 kg/m.   ACP Documents: None present. DVT prophylaxis:   SCDs.   Code Status: DNR:  Family Communication: Discussed with patient's sister via phone. Disposition:  Status is: Inpatient Remains inpatient appropriate because: Pending clinical stability, hopeful discharge home tomorrow.     Consultants:   Deboraha Sprang  GI Cardiology  Procedures:     Antimicrobials:      Subjective:  History as noted above.  No BM in 2 days.  Currently without dyspnea or chest pain while at rest.  States that he has a sister in [place] and brother-in-law who lives  close by in another town.  Objective:   Vitals:   05/21/23 2330 05/21/23 2345 05/22/23 0049 05/22/23 0448  BP: 129/69 126/60 (!) 159/89 129/69  Pulse: 67 91 91 63  Resp: 17 17 18 18   Temp:   97.9 F (36.6 C) 98.1 F (36.7 C)  TempSrc:   Oral   SpO2: 99% 98% 97% 99%  Weight:   86.8 kg   Height:   5\' 8"  (1.727 m)     General exam: Elderly male, moderately built and overweight lying comfortably propped up in bed without distress.  Oral mucosa moist. Respiratory system: Clear to auscultation. Respiratory effort normal. Cardiovascular system: S1 & S2 heard, irregularly irregular. No JVD, murmurs, rubs, gallops or clicks. No pedal edema.  Telemetry personally reviewed: A-fib with controlled ventricular rate. Gastrointestinal system: Abdomen is nondistended, soft and nontender. No organomegaly or masses felt. Normal bowel sounds heard. Central nervous system: Alert and oriented. No focal neurological deficits. Extremities: Symmetric 5 x 5 power. Skin: No rashes, lesions or ulcers Psychiatry: Judgement and insight appear normal. Mood & affect appropriate.     Data Reviewed:   I have personally reviewed following labs and imaging studies   CBC: Recent Labs  Lab 05/21/23 1819 05/22/23 0130  WBC 5.0 4.7  HGB 12.2* 11.3*  HCT 34.2* 32.5*  MCV 95.5 99.4  PLT 139* 125*    Basic Metabolic Panel: Recent Labs  Lab 05/21/23 1819 05/22/23 0130  NA 136 135  K 3.7 3.8  CL 104 104  CO2 22 21*  GLUCOSE 250* 253*  BUN 20 20  CREATININE 0.79 0.82  CALCIUM 9.1 8.9    Liver Function Tests: No results for input(s): "AST", "ALT", "ALKPHOS", "BILITOT", "PROT", "ALBUMIN" in the last 168 hours.  CBG: Recent Labs  Lab 05/22/23 0556 05/22/23 0838  GLUCAP 211* 232*    Microbiology Studies:   Recent Results (from the past 240 hour(s))  MICROSCOPIC MESSAGE     Status: None   Collection Time: 05/12/23  9:58 AM  Result Value Ref Range Status   Note   Final    Comment: This  urine was analyzed for the presence of WBC,  RBC, bacteria, casts, and other formed elements.  Only those elements seen were reported. . .     Radiology Studies:  DG Chest 2 View  Result Date: 05/21/2023 CLINICAL DATA:  Shortness of breath EXAM: CHEST - 2 VIEW COMPARISON:  Previous studies including chest radiograph done on 09/04/2015 and CT done on 06/30/2022 FINDINGS: Transverse diameter of heart is increased. Thoracic aorta is tortuous. There is previous CABG. There is a prosthetic cardiac valve. There are no signs of pulmonary edema or focal pulmonary consolidation. There is fixed hiatal hernia. There is no pleural effusion or pneumothorax. IMPRESSION: Cardiomegaly. There are no signs of pulmonary edema or focal pulmonary consolidation. Hiatal hernia. Electronically Signed   By: Ernie Avena M.D.   On: 05/21/2023 18:02    Scheduled Meds:    insulin aspart  0-9 Units Subcutaneous Q4H    Continuous Infusions:    lactated ringers 75 mL/hr at 05/22/23 0125     LOS: 0 days     Marcellus Scott, MD,  FACP, [facility],  SFHM, CMPC, CHCQM-PHYADV   [facility] Ad[facility] [place]     To contact the attending provider between 7A-7P or the covering provider during after hours 7P-7A, please log into the web site [email or web address] and access using universal [place] password for that web site. If you do not have the password, please call the hospital operator.  05/22/2023, 8:51 AM

## 2023-05-22 NOTE — Inpatient Diabetes Management (Addendum)
Inpatient Diabetes Program Recommendations  AACE/ADA: New Consensus Statement on Inpatient Glycemic Control (2015)  Target Ranges:  Prepandial:   less than 140 mg/dL      Peak postprandial:   less than 180 mg/dL (1-2 hours)      Critically ill patients:  140 - 180 mg/dL   Lab Results  Component Value Date   GLUCAP 166 (H) 05/22/2023   HGBA1C 12.7 (H) 05/12/2023    Review of Glycemic Control  Latest Reference Range & Units 05/22/23 05:56 05/22/23 08:38 05/22/23 11:49  Glucose-Capillary 70 - 99 mg/dL 161 (H) 096 (H) 045 (H)   Diabetes history: DM 2 Outpatient Diabetes medications:  Glucotrol 10 mg tid with meals, Metformin XR 500 mg bid, Mounjaro 0.5 mg weekly (just started last week) Current orders for Inpatient glycemic control:  Novolog 0-9 units q 4 hours  Inpatient Diabetes Program Recommendations:    Note markedly elevated A1C.  May need to add basal insulin if patient is unable to tolerate GLP due to GI effects.   Consider adding Semglee 14 units daily while in the hospital.  Looks like he was on Jardiance in the past which had to be stopped due to cost?    Will see patient when appropriate.   Addendum 1430- Spoke with patient at bedside regarding DM management.  He states that when his A1C was checked on 05/12/23, he had been out of all DM medications for a month.  He was also re-started on Mounjaro (states he had taken it in the past), however symptoms started after his first injection.   We briefly discussed his A1C and goal A1c of 7% (which it appears he was at last July of 2023).  He states that Dr. Oneta Rack does not want to start insulin on him.  He was unable to afford Jardiance as well last year.  Encouraged patient to check his blood sugars bid and to write down and take results back to MD for medication adjustment.  He verbalized understanding. May want to hold [facility] until patient follows up with PCP as well?    Thanks  Beryl Meager, RN, BC-ADM Inpatient Diabetes  Coordinator Pager [phone]  (8a-5p)

## 2023-05-22 NOTE — H&P (Signed)
History and Physical    Patient: Billy Reilly ZOX:[id number] DOB: 1941-06-14 DOA: 05/21/2023 DOS: the patient was seen and examined on 05/22/2023 PCP: [name], MD  Patient coming from: Home  Chief Complaint:  Chief Complaint  Patient presents with   Shortness of Breath   HPI: Billy Reilly is a 82 y.o. male with medical history significant for history of aortic valve replacement currently on Coumadin, history of atrial fibrillation, hypertension, ascending aortic aneurysm, type 2 diabetes mellitus, mixed hyperlipidemia, and CKD who went to see his primary care physician today because of shortness of breath on exertion.  The patient is not short of breath at rest.  He denies any chest pain or fever.  Patient was concerned that he may have pneumonia.  His PCP sent him to the emergency department.   The patient started taking Mounjaro and his first shot was 6 days ago.  That was followed by severe abdominal pain for several days.  Could not eat anything.  He also started having black tarry stools right after that injection.  The patient continued to have black tarry stools up until 2 days ago.  He has not had a bowel movement in the last 2 days.   Patient follows with Dr. Rennis Golden of cardiology.  His abdominal pain has resolved and his appetite has returned. While in the emergency department patient's workup consisted of hemoglobin of 11.3 and a positive FOBT.  He is being admitted for further workup of the source of bleeding.  Review of Systems: As mentioned in the history of present illness. All other systems reviewed and are negative. Past Medical History:  Diagnosis Date   Anemia    AS (aortic stenosis)    valve replacement-st. jude mechanical valve   ASHD (arteriosclerotic heart disease)    CAD (coronary artery disease), native coronary artery    CABG 07/18/2003   Cancer (HCC)    prostate   GERD (gastroesophageal reflux disease)    History of nuclear stress test 02/12/10   EF  67%, normal perfusion   Hyperlipidemia    Hypertension    Morbid obesity (HCC)    Type II or unspecified type diabetes mellitus without mention of complication, not stated as uncontrolled    Umbilical hernia    Vitamin B12 deficiency    Vitamin D deficiency    Past Surgical History:  Procedure Laterality Date   AORTIC VALVE REPLACEMENT  07/18/2003   22mm ATS-AP valve   AORTIC VALVE REPLACEMENT (AVR)/CORONARY ARTERY BYPASS GRAFTING (CABG)  07/18/2003   AVR; bypass x 2-SVG to first diag, SVG to posterior descending   CARDIAC CATHETERIZATION  07/13/2003   AS-aortic valve gradient , valve area 1.1; obstruction of ostium of posterior lateral branch with obstruction in the ostium of first diagnonal   HERNIA REPAIR  09/11/2011   RIH, umbilical   NM MYOCAR PERF WALL MOTION  02/12/2010   protocol:Bruce, EF 67%, exercise cap 7 METS, low risk scan   TEE WITHOUT CARDIOVERSION  07/18/2003   no evidence of PE, Aortic valve heavily calcified,    TRANSTHORACIC ECHOCARDIOGRAM  02/21/2013   EF60-65%, Septal motion showed paradox. atrium moderately dilated   Social History:  reports that he has never smoked. He has never used smokeless tobacco. He reports that he does not drink alcohol and does not use drugs.  No Known Allergies  Family History  Problem Relation Age of Onset   Stroke Mother    Heart attack Father    Heart disease  Father    Cancer Brother    Cancer Sister     Prior to Admission medications   Medication Sig Start Date End Date Taking? Authorizing Provider  aspirin 81 MG tablet Take 81 mg by mouth daily.    [provider]  Cholecalciferol 125 MCG (5000 UT) CHEW Chew 5,000 Units by mouth daily. Patient taking differently: Chew 5,000 Units by mouth daily. 15,000 units 12/10/21   Judd Gaudier, NP  CINNAMON PO Take 500 mg by mouth in the morning and at bedtime.     [provider]  clotrimazole-betamethasone (LOTRISONE) cream Apply to rash 2 to 3 x /day  06/19/22   Adela Glimpse, NP  Cyanocobalamin (B-12 SL) Place under the tongue daily.    [provider]  enalapril (VASOTEC) 20 MG tablet Take 2 tablets (40 mg total) by mouth daily. 12/31/22   Chrystie Nose, MD  famotidine (PEPCID) 20 MG tablet TAKE 1 TABLET BY MOUTH TWICE A DAY WITH MEALS FOR ACIDE INDIGESTION 11/03/22   Raynelle Dick, NP  glipiZIDE (GLUCOTROL) 10 MG tablet TAKE 1 TABLET BY MOUTH 3 TIMES A DAY BEFORE MEALS FOR DIABETES 11/03/22   Raynelle Dick, NP  glucose blood (ONE TOUCH ULTRA TEST) test strip CHECK BLOOD SUGAR ONCE DAILY 10/29/21   Judd Gaudier, NP  metFORMIN (GLUCOPHAGE-XR) 500 MG 24 hr tablet TAKE 2 TABLETS TWICE A DAY WITH MEALS FOR DIABETES 11/23/22   [name], MD  metoprolol succinate (TOPROL-XL) 50 MG 24 hr tablet TAKE 1 AND 1/2 TABLETS DAILY (75 MG TOTAL) BY MOUTH WITH FOOD OR IMMEDIATELY AFTER MEALS 12/19/22   Hilty, Lisette Abu, MD  rosuvastatin (CRESTOR) 10 MG tablet TAKE 1 TABLET BY MOUTH EVERY DAY 03/04/22   Judd Gaudier, NP  tirzepatide [facility]) 5 MG/0.5ML Pen Inject  1 pen (5 mg)  into Skin  every 7 days  for Diabetes  ( e11.29) 05/13/23   [name], MD  warfarin (COUMADIN) 5 MG tablet TAKE 1/2 TO 1 TABLET BY MOUTH DAILY OR AS DIRECTED 03/05/22   Chrystie Nose, MD    Physical Exam: Vitals:   05/21/23 2315 05/21/23 2330 05/21/23 2345 05/22/23 0049  BP: 117/63 129/69 126/60 (!) 159/89  Pulse: 82 67 91 91  Resp: (!) 21 17 17 18   Temp:    97.9 F (36.6 C)  TempSrc:    Oral  SpO2: 98% 99% 98% 97%  Weight:    86.8 kg  Height:    5\' 8"  (1.727 m)   Physical Exam:  General: No acute distress, well developed, well nourished HEENT: Normocephalic, atraumatic, PER Cardiovascular: Normal rate and rhythm. Faint DP pulses. Pulmonary: Normal pulmonary effort, normal breath sounds Gastrointestinal: Nondistended abdomen, soft, non-tender, normoactive bowel sounds, no organomegaly Musculoskeletal:Normal ROM, no lower ext  edema Lymphadenopathy: No cervical LAD. Skin: Skin is warm and dry. Neuro: No focal deficits noted, AAOx3. PSYCH: Attentive and cooperative   Data Reviewed:  Results for orders placed or performed during the hospital encounter of 05/21/23 (from the past 24 hour(s))  Basic metabolic panel     Status: Abnormal   Collection Time: 05/21/23  6:19 PM  Result Value Ref Range   Sodium 136 135 - 145 mmol/L   Potassium 3.7 3.5 - 5.1 mmol/L   Chloride 104 98 - 111 mmol/L   CO2 22 22 - 32 mmol/L   Glucose, Bld 250 (H) 70 - 99 mg/dL   BUN 20 8 - 23 mg/dL   Creatinine, Ser 4.09 0.61 -  1.24 mg/dL   Calcium 9.1 8.9 - 16.1 mg/dL   GFR, Estimated >09 >60 mL/min   Anion gap 10 5 - 15  CBC     Status: Abnormal   Collection Time: 05/21/23  6:19 PM  Result Value Ref Range   WBC 5.0 4.0 - 10.5 K/uL   RBC 3.58 (L) 4.22 - 5.81 MIL/uL   Hemoglobin 12.2 (L) 13.0 - 17.0 g/dL   HCT 45.4 (L) 09.8 - 11.9 %   MCV 95.5 80.0 - 100.0 fL   MCH 34.1 (H) 26.0 - 34.0 pg   MCHC 35.7 30.0 - 36.0 g/dL   RDW 14.7 82.9 - 56.2 %   Platelets 139 (L) 150 - 400 K/uL   nRBC 0.0 0.0 - 0.2 %  Troponin I (High Sensitivity)     Status: None   Collection Time: 05/21/23  6:19 PM  Result Value Ref Range   Troponin I (High Sensitivity) 10 <18 ng/L  Brain natriuretic peptide     Status: Abnormal   Collection Time: 05/21/23  6:19 PM  Result Value Ref Range   B Natriuretic Peptide 116.1 (H) 0.0 - 100.0 pg/mL  Protime-INR     Status: Abnormal   Collection Time: 05/21/23  6:19 PM  Result Value Ref Range   Prothrombin Time 29.5 (H) 11.4 - 15.2 seconds   INR 2.8 (H) 0.8 - 1.2  Occult blood card to lab, stool     Status: Abnormal   Collection Time: 05/21/23  9:08 PM  Result Value Ref Range   Fecal Occult Bld POSITIVE (A) NEGATIVE  Troponin I (High Sensitivity)     Status: None   Collection Time: 05/21/23 10:20 PM  Result Value Ref Range   Troponin I (High Sensitivity) 11 <18 ng/L  Basic metabolic panel     Status: Abnormal    Collection Time: 05/22/23  1:30 AM  Result Value Ref Range   Sodium 135 135 - 145 mmol/L   Potassium 3.8 3.5 - 5.1 mmol/L   Chloride 104 98 - 111 mmol/L   CO2 21 (L) 22 - 32 mmol/L   Glucose, Bld 253 (H) 70 - 99 mg/dL   BUN 20 8 - 23 mg/dL   Creatinine, Ser 1.30 0.61 - 1.24 mg/dL   Calcium 8.9 8.9 - 86.5 mg/dL   GFR, Estimated >78 >46 mL/min   Anion gap 10 5 - 15  CBC     Status: Abnormal   Collection Time: 05/22/23  1:30 AM  Result Value Ref Range   WBC 4.7 4.0 - 10.5 K/uL   RBC 3.27 (L) 4.22 - 5.81 MIL/uL   Hemoglobin 11.3 (L) 13.0 - 17.0 g/dL   HCT 96.2 (L) 95.2 - 84.1 %   MCV 99.4 80.0 - 100.0 fL   MCH 34.6 (H) 26.0 - 34.0 pg   MCHC 34.8 30.0 - 36.0 g/dL   RDW 32.4 40.1 - 02.7 %   Platelets 125 (L) 150 - 400 K/uL   nRBC 0.0 0.0 - 0.2 %  Brain natriuretic peptide     Status: None   Collection Time: 05/22/23  1:30 AM  Result Value Ref Range   B Natriuretic Peptide 84.4 0.0 - 100.0 pg/mL     Assessment and Plan:  # GI Bleed/ Dark Tarry stools on Coumadin - NPO, IV Protonix GI (Dr Lorenso Quarry) Consulted Hold Coumadin tonight Hold Aspirin Consult Cardiology in AM.   Hold all unessential meds  # H/o AVR and afib - on Coumadin  #DMT2- NPO  at this time.  Hold metformin and Glipizide      Advance Care Planning:   Code Status: DNR  The patient names his Sister Billy Reilly as his surrogate decision maker and wants to be DNR.   Consults: GI, cardiology  Family Communication: none  Severity of Illness: The appropriate patient status for this patient is INPATIENT. Inpatient status is judged to be reasonable and necessary in order to provide the required intensity of service to ensure the patient's safety. The patient's presenting symptoms, physical exam findings, and initial radiographic and laboratory data in the context of their chronic comorbidities is felt to place them at high risk for further clinical deterioration. Furthermore, it is not anticipated that the patient  will be medically stable for discharge from the hospital within 2 midnights of admission.   * I certify that at the point of admission it is my clinical judgment that the patient will require inpatient hospital care spanning beyond 2 midnights from the point of admission due to high intensity of service, high risk for further deterioration and high frequency of surveillance required.*  Author: Buena Irish, MD 05/22/2023 2:31 AM  For on call review [email or web address].

## 2023-05-22 NOTE — Consult Note (Signed)
Reason for Consult: Melena minimal anemia Referring Physician: Hospital team  Billy Reilly is an 82 y.o. male.  HPI: Patient seen and examined and his hospital computer chart reviewed and he did not have any GI symptoms until he got his shot of his weight loss medicine and had significant abdominal pain and was unable to eat and shortness of breath and dyspnea on exertion and did have black stools for 2 days and has been better for the last 2 days but primary care doctor sent him to the ER and was found to be guaiac positive and slightly anemic and was admitted for further workup and plans he has minimal reflux on famotidine but does take an aspirin a day with his Coumadin but no extra arthritis pills nonsteroidals etc. and he has had a colonoscopy in the past maybe 8 to 10 years ago but no other previous GI issues and his family history is negative for any GI problems  Past Medical History:  Diagnosis Date   Anemia    AS (aortic stenosis)    valve replacement-st. jude mechanical valve   ASHD (arteriosclerotic heart disease)    CAD (coronary artery disease), native coronary artery    CABG 07/18/2003   Cancer (HCC)    prostate   GERD (gastroesophageal reflux disease)    History of nuclear stress test 02/12/10   EF 67%, normal perfusion   Hyperlipidemia    Hypertension    Morbid obesity (HCC)    Type II or unspecified type diabetes mellitus without mention of complication, not stated as uncontrolled    Umbilical hernia    Vitamin B12 deficiency    Vitamin D deficiency     Past Surgical History:  Procedure Laterality Date   AORTIC VALVE REPLACEMENT  07/18/2003   22mm ATS-AP valve   AORTIC VALVE REPLACEMENT (AVR)/CORONARY ARTERY BYPASS GRAFTING (CABG)  07/18/2003   AVR; bypass x 2-SVG to first diag, SVG to posterior descending   CARDIAC CATHETERIZATION  07/13/2003   AS-aortic valve gradient , valve area 1.1; obstruction of ostium of posterior lateral branch with obstruction in the  ostium of first diagnonal   HERNIA REPAIR  09/11/2011   RIH, umbilical   NM MYOCAR PERF WALL MOTION  02/12/2010   protocol:Bruce, EF 67%, exercise cap 7 METS, low risk scan   TEE WITHOUT CARDIOVERSION  07/18/2003   no evidence of PE, Aortic valve heavily calcified,    TRANSTHORACIC ECHOCARDIOGRAM  02/21/2013   EF60-65%, Septal motion showed paradox. atrium moderately dilated    Family History  Problem Relation Age of Onset   Stroke Mother    Heart attack Father    Heart disease Father    Cancer Brother    Cancer Sister     Social History:  reports that he has never smoked. He has never used smokeless tobacco. He reports that he does not drink alcohol and does not use drugs.  Allergies: No Known Allergies  Medications: I have reviewed the patient's current medications.  Results for orders placed or performed during the hospital encounter of 05/21/23 (from the past 48 hour(s))  Basic metabolic panel     Status: Abnormal   Collection Time: 05/21/23  6:19 PM  Result Value Ref Range   Sodium 136 135 - 145 mmol/L   Potassium 3.7 3.5 - 5.1 mmol/L   Chloride 104 98 - 111 mmol/L   CO2 22 22 - 32 mmol/L   Glucose, Bld 250 (H) 70 - 99 mg/dL  Comment: Glucose reference range applies only to samples taken after fasting for at least 8 hours.   BUN 20 8 - 23 mg/dL   Creatinine, Ser 4.09 0.61 - 1.24 mg/dL   Calcium 9.1 8.9 - 81.1 mg/dL   GFR, Estimated >91 >47 mL/min    Comment: (NOTE) Calculated using the CKD-EPI Creatinine Equation (2021)    Anion gap 10 5 - 15    Comment: Performed at [facility], [address], [place], [place] [address]  CBC     Status: Abnormal   Collection Time: 05/21/23  6:19 PM  Result Value Ref Range   WBC 5.0 4.0 - 10.5 K/uL   RBC 3.58 (L) 4.22 - 5.81 MIL/uL   Hemoglobin 12.2 (L) 13.0 - 17.0 g/dL   HCT 21.3 (L) 08.6 - 57.8 %   MCV 95.5 80.0 - 100.0 fL   MCH 34.1 (H) 26.0 - 34.0 pg   MCHC 35.7 30.0 - 36.0 g/dL   RDW 46.9 62.9 -  52.8 %   Platelets 139 (L) 150 - 400 K/uL   nRBC 0.0 0.0 - 0.2 %    Comment: Performed at [facility], [address] [place], [place] [address]  Troponin I (High Sensitivity)     Status: None   Collection Time: 05/21/23  6:19 PM  Result Value Ref Range   Troponin I (High Sensitivity) 10 <18 ng/L    Comment: (NOTE) Elevated high sensitivity troponin I (hsTnI) values and significant  changes across serial measurements may suggest ACS but many other  chronic and acute conditions are known to elevate hsTnI results.  Refer to the "Links" section for chest pain algorithms and additional  guidance. Performed at [facility], [address], [place], [place] [address]   Brain natriuretic peptide     Status: Abnormal   Collection Time: 05/21/23  6:19 PM  Result Value Ref Range   B Natriuretic Peptide 116.1 (H) 0.0 - 100.0 pg/mL    Comment: Performed at [facility], [address] [place], [place] [address]  Protime-INR     Status: Abnormal   Collection Time: 05/21/23  6:19 PM  Result Value Ref Range   Prothrombin Time 29.5 (H) 11.4 - 15.2 seconds   INR 2.8 (H) 0.8 - 1.2    Comment: (NOTE) INR goal varies based on device and disease states. Performed at [facility], [address] [place], [place] [address]   Occult blood card to lab, stool     Status: Abnormal   Collection Time: 05/21/23  9:08 PM  Result Value Ref Range   Fecal Occult Bld POSITIVE (A) NEGATIVE    Comment: Performed at [facility], [address] [place], [place] [address]  Troponin I (High Sensitivity)     Status: None   Collection Time: 05/21/23 10:20 PM  Result Value Ref Range   Troponin I (High Sensitivity) 11 <18 ng/L    Comment: (NOTE) Elevated high sensitivity troponin I (hsTnI) values and significant  changes across serial measurements may suggest ACS but many other  chronic and acute conditions are  known to elevate hsTnI results.  Refer to the "Links" section for chest pain algorithms and additional  guidance. Performed at [facility], [address], [place], [place] [address]   TSH     Status: None   Collection Time: 05/22/23  1:30 AM  Result Value Ref Range   TSH 2.108 0.350 - 4.500 uIU/mL    Comment: Performed  by a 3rd Generation assay with a functional sensitivity of <=0.01 uIU/mL. Performed at [facility], [address] [place], [place] [address]   Basic metabolic panel     Status: Abnormal   Collection Time: 05/22/23  1:30 AM  Result Value Ref Range   Sodium 135 135 - 145 mmol/L   Potassium 3.8 3.5 - 5.1 mmol/L   Chloride 104 98 - 111 mmol/L   CO2 21 (L) 22 - 32 mmol/L   Glucose, Bld 253 (H) 70 - 99 mg/dL    Comment: Glucose reference range applies only to samples taken after fasting for at least 8 hours.   BUN 20 8 - 23 mg/dL   Creatinine, Ser 6.04 0.61 - 1.24 mg/dL   Calcium 8.9 8.9 - 54.0 mg/dL   GFR, Estimated >98 >11 mL/min    Comment: (NOTE) Calculated using the CKD-EPI Creatinine Equation (2021)    Anion gap 10 5 - 15    Comment: Performed at [facility], [address] [place], [place] [address]  CBC     Status: Abnormal   Collection Time: 05/22/23  1:30 AM  Result Value Ref Range   WBC 4.7 4.0 - 10.5 K/uL   RBC 3.27 (L) 4.22 - 5.81 MIL/uL   Hemoglobin 11.3 (L) 13.0 - 17.0 g/dL   HCT 29.5 (L) 62.1 - 30.8 %   MCV 99.4 80.0 - 100.0 fL   MCH 34.6 (H) 26.0 - 34.0 pg   MCHC 34.8 30.0 - 36.0 g/dL   RDW 65.7 84.6 - 96.2 %   Platelets 125 (L) 150 - 400 K/uL   nRBC 0.0 0.0 - 0.2 %    Comment: Performed at [facility], [address], [place], [place] [address]  Brain natriuretic peptide     Status: None   Collection Time: 05/22/23  1:30 AM  Result Value Ref Range   B Natriuretic Peptide 84.4 0.0 - 100.0 pg/mL    Comment: Performed at [facility], [address] [place], [place] [address]  Protime-INR     Status: Abnormal   Collection Time: 05/22/23  3:27 AM  Result Value Ref Range   Prothrombin Time 27.2 (H) 11.4 - 15.2 seconds   INR 2.5 (H) 0.8 - 1.2    Comment: (NOTE) INR goal varies based on device and disease states. Performed at [facility], [address], [place], [place] [address]   Glucose, capillary     Status: Abnormal   Collection Time: 05/22/23  5:56 AM  Result Value Ref Range   Glucose-Capillary 211 (H) 70 - 99 mg/dL    Comment: Glucose reference range applies only to samples taken after fasting for at least 8 hours.  Glucose, capillary     Status: Abnormal   Collection Time: 05/22/23  8:38 AM  Result Value Ref Range   Glucose-Capillary 232 (H) 70 - 99 mg/dL    Comment: Glucose reference range applies only to samples taken after fasting for at least 8 hours.    DG Chest 2 View  Result Date: 05/21/2023 CLINICAL DATA:  Shortness of breath EXAM: CHEST - 2 VIEW COMPARISON:  Previous studies including chest radiograph done on 09/04/2015 and CT done on 06/30/2022 FINDINGS: Transverse diameter of heart is increased. Thoracic aorta is tortuous. There is previous CABG. There is a prosthetic cardiac valve. There are no signs of pulmonary edema or focal pulmonary consolidation. There is fixed hiatal hernia. There is no pleural  effusion or pneumothorax. IMPRESSION: Cardiomegaly. There are no signs of pulmonary edema or focal pulmonary consolidation. Hiatal hernia. Electronically Signed   By: Ernie Avena M.D.   On: 05/21/2023 18:02    ROS negative except above he is feeling better Blood pressure 129/69, pulse 63, temperature 98.1 F (36.7 C), resp. rate 18, height 5\' 8"  (1.727 m), weight 86.8 kg, SpO2 99 %. Physical Exam vital signs stable afebrile no acute distress exam please see preassessment evaluation BUN and creatinine okay hemoglobin 11.3 INR 2.5  Assessment/Plan: Self-limited melena and patient  on aspirin and Coumadin Plan: I think we can safely do an endoscopy even though his INR is elevated since he does not have signs of obvious bleeding and has not had black stools in 2 days the wrist should be minimal and if diagnostic but not worrisome hopefully he could go home soon but might need to hold his aspirin for a week or 2 based on those findings and the risks of the procedure were  discussed with the patient and he agrees with proceeding  Trelyn Vanderlinde E 05/22/2023, 11:06 AM

## 2023-05-22 NOTE — Anesthesia Preprocedure Evaluation (Addendum)
Anesthesia Evaluation  Patient identified by MRN, date of birth, ID band Patient awake    Reviewed: Allergy & Precautions, NPO status , Patient's Chart, lab work & pertinent test results  Airway Mallampati: II  TM Distance: >3 FB Neck ROM: Full    Dental no notable dental hx.    Pulmonary neg pulmonary ROS   Pulmonary exam normal        Cardiovascular hypertension, Pt. on medications and Pt. on home beta blockers + CAD  + dysrhythmias Atrial Fibrillation  Rhythm:Regular Rate:Normal  ECHO 2024:  1. Left ventricular ejection fraction, by estimation, is 65 to 70%. The  left ventricle has normal function. The left ventricle has no regional  wall motion abnormalities. Left ventricular diastolic parameters are  indeterminate.   2. Right ventricular systolic function is mildly reduced. The right  ventricular size is mildly enlarged. There is normal pulmonary artery  systolic pressure. The estimated right ventricular systolic pressure is  29.4 mmHg.   3. Left atrial size was mildly dilated.   4. The mitral valve is normal in structure. Trivial mitral valve  regurgitation. No evidence of mitral stenosis.   5. The aortic valve has been repaired/replaced. Aortic valve  regurgitation is trivial. There is a 22 mm St. Jude mechanical valve  present in the aortic position. Procedure Date: 07/18/2003. Vmax 2.8 m/s,  EOA 1.9 cm^2, DI 0.47. MG , stable from  prior echo 12/2019   6. Aortic dilatation noted. There is dilatation of the ascending aorta,  measuring 44 mm.   7. The inferior vena cava is normal in size with greater than 50%  respiratory variability, suggesting right atrial pressure of 3 mmHg.     Neuro/Psych negative neurological ROS  negative psych ROS   GI/Hepatic Neg liver ROS,GERD  ,,  Endo/Other  diabetes, Type 2, Oral Hypoglycemic Agents    Renal/GU   negative genitourinary   Musculoskeletal negative  musculoskeletal ROS (+)    Abdominal Normal abdominal exam  (+)   Peds  Hematology  (+) Blood dyscrasia, anemia Lab Results      Component                Value               Date                      WBC                      4.7                 05/22/2023                HGB                      11.3 (L)            05/22/2023                HCT                      32.5 (L)            05/22/2023                MCV                      99.4  05/22/2023                PLT                      125 (L)             05/22/2023             Lab Results      Component                Value               Date                      NA                       135                 05/22/2023                K                        3.8                 05/22/2023                CO2                      21 (L)              05/22/2023                GLUCOSE                  253 (H)             05/22/2023                BUN                      20                  05/22/2023                CREATININE               0.82                05/22/2023                CALCIUM                  8.9                 05/22/2023                EGFR                     88                  05/12/2023                GFRNONAA                 >60                 05/22/2023              Anesthesia Other Findings  Reproductive/Obstetrics                             Anesthesia Physical Anesthesia Plan  ASA: 3  Anesthesia Plan: MAC   Post-op Pain Management:    Induction: Intravenous  PONV Risk Score and Plan: 1 and Propofol infusion and Treatment may vary due to age or medical condition  Airway Management Planned: Simple Face Mask and Nasal Cannula  Additional Equipment: None  Intra-op Plan:   Post-operative Plan:   Informed Consent: I have reviewed the patients History and Physical, chart, labs and discussed the procedure including the risks, benefits and alternatives for  the proposed anesthesia with the patient or authorized representative who has indicated his/her understanding and acceptance.     Dental advisory given  Plan Discussed with: CRNA  Anesthesia Plan Comments:        Anesthesia Quick Evaluation

## 2023-05-22 NOTE — Progress Notes (Signed)
Mobility Specialist - Progress Note   05/22/23 0915  Mobility  Activity Ambulated with assistance in hallway  Level of Assistance Modified independent, requires aide device or extra time  Assistive Device Other (Comment) (IV Pole)  Distance Ambulated (ft) 350 ft  Activity Response Tolerated well  Mobility Referral Yes  $Mobility charge 1 Mobility  Mobility Specialist Start Time (ACUTE ONLY) 0859  Mobility Specialist Stop Time (ACUTE ONLY) 0914  Mobility Specialist Time Calculation (min) (ACUTE ONLY) 15 min   Pt received in bed and agreeable to mobility. No complaints during session. Pt to bed after session with all needs met.    [facility]

## 2023-05-22 NOTE — Transfer of Care (Signed)
Immediate Anesthesia Transfer of Care Note  Patient: Billy Reilly  Procedure(s) Performed: ESOPHAGOGASTRODUODENOSCOPY (EGD) WITH PROPOFOL  Patient Location: PACU and Endoscopy Unit  Anesthesia Type:MAC  Level of Consciousness: awake and alert   Airway & Oxygen Therapy: Patient Spontanous Breathing and Patient connected to nasal cannula oxygen  Post-op Assessment: Report given to RN and Post -op Vital signs reviewed and stable  Post vital signs: Reviewed and stable  Last Vitals:  Vitals Value Taken Time  BP    Temp    Pulse 90 05/22/23 1224  Resp 15 05/22/23 1224  SpO2 100 % 05/22/23 1224  Vitals shown include unvalidated device data.  Last Pain:  Vitals:   05/22/23 1134  TempSrc: Temporal  PainSc: 0-No pain         Complications: No notable events documented.

## 2023-05-23 DIAGNOSIS — I482 Chronic atrial fibrillation, unspecified: Secondary | ICD-10-CM | POA: Diagnosis not present

## 2023-05-23 DIAGNOSIS — K921 Melena: Secondary | ICD-10-CM | POA: Diagnosis not present

## 2023-05-23 LAB — CBC
HCT: 30.1 % — ABNORMAL LOW (ref 39.0–52.0)
HCT: 30.6 % — ABNORMAL LOW (ref 39.0–52.0)
Hemoglobin: 10.2 g/dL — ABNORMAL LOW (ref 13.0–17.0)
Hemoglobin: 10.4 g/dL — ABNORMAL LOW (ref 13.0–17.0)
MCH: 34.1 pg — ABNORMAL HIGH (ref 26.0–34.0)
MCH: 34.2 pg — ABNORMAL HIGH (ref 26.0–34.0)
MCHC: 33.9 g/dL (ref 30.0–36.0)
MCHC: 34 g/dL (ref 30.0–36.0)
MCV: 100.7 fL — ABNORMAL HIGH (ref 80.0–100.0)
MCV: 100.7 fL — ABNORMAL HIGH (ref 80.0–100.0)
Platelets: 112 10*3/uL — ABNORMAL LOW (ref 150–400)
Platelets: 114 10*3/uL — ABNORMAL LOW (ref 150–400)
RBC: 2.99 MIL/uL — ABNORMAL LOW (ref 4.22–5.81)
RBC: 3.04 MIL/uL — ABNORMAL LOW (ref 4.22–5.81)
RDW: 14.7 % (ref 11.5–15.5)
RDW: 14.8 % (ref 11.5–15.5)
WBC: 4.4 10*3/uL (ref 4.0–10.5)
WBC: 4.1 10*3/uL (ref 4.0–10.5)
nRBC: 0 % (ref 0.0–0.2)
nRBC: 0 % (ref 0.0–0.2)

## 2023-05-23 LAB — BASIC METABOLIC PANEL WITH GFR
Anion gap: 5 (ref 5–15)
BUN: 11 mg/dL (ref 8–23)
CO2: 25 mmol/L (ref 22–32)
Calcium: 8.3 mg/dL — ABNORMAL LOW (ref 8.9–10.3)
Chloride: 106 mmol/L (ref 98–111)
Creatinine, Ser: 0.66 mg/dL (ref 0.61–1.24)
GFR, Estimated: >60 mL/min
Glucose, Bld: 180 mg/dL — ABNORMAL HIGH (ref 70–99)
Potassium: 3.8 mmol/L (ref 3.5–5.1)
Sodium: 136 mmol/L (ref 135–145)

## 2023-05-23 LAB — PROTIME-INR
INR: 2.6 — ABNORMAL HIGH (ref 0.8–1.2)
Prothrombin Time: 28.2 s — ABNORMAL HIGH (ref 11.4–15.2)

## 2023-05-23 LAB — GLUCOSE, CAPILLARY
Glucose-Capillary: 163 mg/dL — ABNORMAL HIGH (ref 70–99)
Glucose-Capillary: 165 mg/dL — ABNORMAL HIGH (ref 70–99)
Glucose-Capillary: 218 mg/dL — ABNORMAL HIGH (ref 70–99)

## 2023-05-23 MED ORDER — METOPROLOL SUCCINATE ER 50 MG PO TB24: 50.0000 mg | ORAL_TABLET | Freq: Every day | ORAL | Status: DC

## 2023-05-23 MED ORDER — PANTOPRAZOLE SODIUM 40 MG PO TBEC: 40.0000 mg | DELAYED_RELEASE_TABLET | Freq: Every day | ORAL | 1 refills | Status: DC

## 2023-05-23 MED ORDER — WARFARIN - PHARMACIST DOSING INPATIENT: Freq: Every day | Status: DC

## 2023-05-23 MED ORDER — WARFARIN SODIUM 2.5 MG PO TABS: 2.5000 mg | ORAL_TABLET | Freq: Every day | ORAL | Status: DC

## 2023-05-23 MED ORDER — METFORMIN HCL ER 500 MG PO TB24: 500.0000 mg | ORAL_TABLET | Freq: Two times a day (BID) | ORAL | Status: DC

## 2023-05-23 NOTE — Progress Notes (Signed)
Progress Note  Patient Name: Billy Reilly Date of Encounter: 05/23/2023  Primary Cardiologist:   Chrystie Nose, MD   Subjective   No pain.  No SOB.   Inpatient Medications    Scheduled Meds:  insulin aspart  0-9 Units Subcutaneous Q4H   metoprolol succinate  50 mg Oral Daily   pantoprazole  40 mg Oral Daily   rosuvastatin  10 mg Oral Daily   Continuous Infusions:  PRN Meds: acetaminophen **OR** acetaminophen, ondansetron **OR** ondansetron (ZOFRAN) IV   Vital Signs    Vitals:   05/22/23 1343 05/22/23 2116 05/23/23 0151 05/23/23 0603  BP: (!) 150/80 (!) 106/58 108/73 136/88  Pulse: 65 84 64 83  Resp: 20 17 18 16   Temp: 97.8 F (36.6 C) 98.3 F (36.8 C) 98.1 F (36.7 C) 98.1 F (36.7 C)  TempSrc: Oral     SpO2: 96% 97% 99% 98%  Weight:      Height:        Intake/Output Summary (Last 24 hours) at 05/23/2023 0745 Last data filed at 05/23/2023 0604 Gross per 24 hour  Intake 1706.15 ml  Output 725 ml  Net 981.15 ml   Filed Weights   05/22/23 0049  Weight: 86.8 kg    Telemetry    Atrial fib with controlled ventricular rate with PVCs.  - Personally Reviewed  ECG    NA - Personally Reviewed  Physical Exam   GEN: No acute distress.   Neck: No  JVD Cardiac: Irregular RR, no murmurs, rubs, or gallops.  Respiratory: Clear  to auscultation bilaterally. GI: Soft, nontender, non-distended  MS: No  edema; No deformity. Neuro:  Nonfocal  Psych: Normal affect   Labs    Chemistry Recent Labs  Lab 05/21/23 1819 05/22/23 0130 05/23/23 0413  NA 136 135 136  K 3.7 3.8 3.8  CL 104 104 106  CO2 22 21* 25  GLUCOSE 250* 253* 180*  BUN 20 20 11   CREATININE 0.79 0.82 0.66  CALCIUM 9.1 8.9 8.3*  GFRNONAA >60 >60 >60  ANIONGAP 10 10 5      Hematology Recent Labs  Lab 05/22/23 0130 05/22/23 1524 05/23/23 0413  WBC 4.7 5.0 4.4  RBC 3.27* 3.21* 2.99*  HGB 11.3* 11.1* 10.2*  HCT 32.5* 32.3* 30.1*  MCV 99.4 100.6* 100.7*  MCH 34.6* 34.6* 34.1*   MCHC 34.8 34.4 33.9  RDW 14.5 14.6 14.7  PLT 125* 121* 112*    Cardiac EnzymesNo results for input(s): "TROPONINI" in the last 168 hours. No results for input(s): "TROPIPOC" in the last 168 hours.   BNP Recent Labs  Lab 05/21/23 1819 05/22/23 0130  BNP 116.1* 84.4     DDimer No results for input(s): "DDIMER" in the last 168 hours.   Radiology    DG Chest 2 View  Result Date: 05/21/2023 CLINICAL DATA:  Shortness of breath EXAM: CHEST - 2 VIEW COMPARISON:  Previous studies including chest radiograph done on 09/04/2015 and CT done on 06/30/2022 FINDINGS: Transverse diameter of heart is increased. Thoracic aorta is tortuous. There is previous CABG. There is a prosthetic cardiac valve. There are no signs of pulmonary edema or focal pulmonary consolidation. There is fixed hiatal hernia. There is no pleural effusion or pneumothorax. IMPRESSION: Cardiomegaly. There are no signs of pulmonary edema or focal pulmonary consolidation. Hiatal hernia. Electronically Signed   By: Ernie Avena M.D.   On: 05/21/2023 18:02    Cardiac Studies   NA  Patient Profile  82 y.o. male with a hx of CAD s/p 2 vessels CABG ( SVG-diag 1, SVG-PDA) 2004, aortic stenosis s/p St Jude mechanical aortica valve replacement 2004, HTN, HLD, type 2 DM, permanent atrial fibrillation, prostate cancer, AAA, who is being seen 05/22/2023 for the evaluation of chronic anticogulation therapy at the request of Dr. Gasper Sells.   Assessment & Plan    History of aortic stenosis s/p St Jude mechanical aortic valve replacement 07/18/2003:  Would resume warfarin today per GI.  There were some gastric polyps and hiatal hernia.  No heparin bridge.  Hold ASA for now.  I think he can be on warfarin alone.   Pharmacy here can comment on his home dose and I will send a message to our pharmacists in the office to be aware.  He has his own home monitoring device.      Permanent A fib/flutter :  Metoprolol was continued. See  above  CAD with CABG 2004:  No anginal symptoms.  No change in therapy.  OK to hold ASA.       AAA:  Stable size 2023.  Following.     For questions or updates, please contact [facility] Please consult [email or web address] for contact info under Cardiology/STEMI.   Signed, Rollene Rotunda, MD  05/23/2023, 7:45 AM

## 2023-05-23 NOTE — Care Management Important Message (Signed)
Important Message  Patient Details  Name: Billy Reilly MRN: [id number] Date of Birth: 05-03-1941   Medicare Important Message Given:  Yes     Georgie Chard, LCSW 05/23/2023, 1:38 PM

## 2023-05-23 NOTE — Care Management Obs Status (Signed)
MEDICARE OBSERVATION STATUS NOTIFICATION   Patient Details  Name: Billy Reilly MRN: [id number] Date of Birth: [birth year]   Medicare Observation Status Notification Given:  Yes    Georgie Chard, LCSW 05/23/2023, 1:39 PM

## 2023-05-23 NOTE — Discharge Instructions (Signed)

## 2023-05-23 NOTE — Progress Notes (Signed)
ANTICOAGULATION CONSULT NOTE - Initial Consult  Pharmacy Consult for warfarin Indication:  mechanical AVR  No Known Allergies  Patient Measurements: Height: 5\' 8"  (172.7 cm) Weight: 86.8 kg (191 lb 5.8 oz) IBW/kg (Calculated) : 68.4 Heparin Dosing Weight:   Vital Signs: Temp: 98.1 F (36.7 C) (06/29 0603) BP: 136/88 (06/29 0603) Pulse Rate: 83 (06/29 0603)  Labs: Recent Labs    05/21/23 1819 05/21/23 2220 05/22/23 0130 05/22/23 0327 05/22/23 1524 05/23/23 0413  HGB 12.2*  --  11.3*  --  11.1* 10.2*  HCT 34.2*  --  32.5*  --  32.3* 30.1*  PLT 139*  --  125*  --  121* 112*  LABPROT 29.5*  --   --  27.2*  --  28.2*  INR 2.8*  --   --  2.5*  --  2.6*  CREATININE 0.79  --  0.82  --   --  0.66  TROPONINIHS 10 11  --   --   --   --     Estimated Creatinine Clearance: 79 mL/min (by C-G formula based on SCr of 0.66 mg/dL).   Medical History: Past Medical History:  Diagnosis Date   Anemia    AS (aortic stenosis)    valve replacement-st. jude mechanical valve   ASHD (arteriosclerotic heart disease)    CAD (coronary artery disease), native coronary artery    CABG 07/18/2003   Cancer (HCC)    prostate   GERD (gastroesophageal reflux disease)    History of nuclear stress test 02/12/10   EF 67%, normal perfusion   Hyperlipidemia    Hypertension    Morbid obesity (HCC)    Type II or unspecified type diabetes mellitus without mention of complication, not stated as uncontrolled    Umbilical hernia    Vitamin B12 deficiency    Vitamin D deficiency     Medications:  Medications Prior to Admission  Medication Sig Dispense Refill Last Dose   aspirin 81 MG tablet Take 81 mg by mouth daily.   05/20/2023   clotrimazole-betamethasone (LOTRISONE) cream Apply to rash 2 to 3 x /day 45 g 3 Past Week   warfarin (COUMADIN) 5 MG tablet TAKE 1/2 TO 1 TABLET BY MOUTH DAILY OR AS DIRECTED (Patient taking differently: Take 2.5-5 mg by mouth daily.) 90 tablet 0 05/20/2023 at 1800    Cholecalciferol 125 MCG (5000 UT) CHEW Chew 5,000 Units by mouth daily. (Patient taking differently: Chew 5,000 Units by mouth daily. 15,000 units)   05/20/2023   CINNAMON PO Take 500 mg by mouth in the morning and at bedtime.    05/20/2023   Cyanocobalamin (B-12 SL) Place under the tongue daily.   05/20/2023   famotidine (PEPCID) 20 MG tablet TAKE 1 TABLET BY MOUTH TWICE A DAY WITH MEALS FOR ACIDE INDIGESTION (Patient taking differently: Take 20 mg by mouth 2 (two) times daily.) 180 tablet 3 05/20/2023   glipiZIDE (GLUCOTROL) 10 MG tablet TAKE 1 TABLET BY MOUTH 3 TIMES A DAY BEFORE MEALS FOR DIABETES (Patient taking differently: Take 10 mg by mouth 3 (three) times daily.) 270 tablet 3 05/20/2023   glucose blood (ONE TOUCH ULTRA TEST) test strip CHECK BLOOD SUGAR ONCE DAILY 100 each 3    metFORMIN (GLUCOPHAGE-XR) 500 MG 24 hr tablet TAKE 2 TABLETS TWICE A DAY WITH MEALS FOR DIABETES (Patient taking differently: Take 500 mg by mouth 2 (two) times daily with a meal.) 360 tablet 3 05/20/2023   metoprolol succinate (TOPROL-XL) 50 MG 24 hr tablet TAKE  1 AND 1/2 TABLETS DAILY (75 MG TOTAL) BY MOUTH WITH FOOD OR IMMEDIATELY AFTER MEALS (Patient taking differently: Take 50 mg by mouth daily.) 135 tablet 2 05/20/2023   rosuvastatin (CRESTOR) 10 MG tablet TAKE 1 TABLET BY MOUTH EVERY DAY (Patient taking differently: Take 10 mg by mouth daily.) 90 tablet 3 05/20/2023   tirzepatide [facility]) 5 MG/0.5ML Pen Inject  1 pen (5 mg)  into Skin  every 7 days  for Diabetes  ( e11.29) (Patient not taking: Reported on 05/22/2023) 2 mL 0 Not Taking    Assessment: 82 yo M on warfarin PTA for mechanical AVR.  Pharmacy consulted to resume warfarin today per cardiology.  No heparin bridge, hold ASA for now.  6/28 EGD: gastric polyps, hiatal hernia.  PTA warfarin dose:  2.5 mg daily per coumadin clinic note on 05/11/2023.  Last dose PTA: 05/20/23 at 1800 Warfarin was held 6/27 & 6/28 05/23/2023 INR 2.6 - therapeutic after warfarin held  x 2 days.  Hg 10.2, PLT 112 - low but stable.   Goal of Therapy:  INR 2-3 Monitor platelets by anticoagulation protocol: Yes   Plan:  Resume home dose of warfarin 2.5 mg po daily Daily INR  Herby Abraham, Pharm.D Use secure chat for questions 05/23/2023 9:17 AM

## 2023-05-23 NOTE — Discharge Summary (Signed)
Physician Discharge Summary  Billy Reilly ZOX:[id number] DOB: 02-13-1941  PCP: [name], MD  Admitted from: Home Discharged to: Home  Admit date: 05/21/2023 Discharge date: 05/23/2023  Recommendations for Outpatient Follow-up:    Follow-up Information     [name], MD. Schedule an appointment as soon as possible for a visit in 1 week(s).   Specialty: Internal Medicine Why: To be seen with repeat labs (CBC & BMP).  Patient also followed at Coumadin clinic. Contact information: [address] [place] [address] [phone]         Chrystie Nose, MD. Schedule an appointment as soon as possible for a visit.   Specialty: Cardiology Contact information: [address] [place] [address] [place] [address] [phone]                Patient self checks INR at home and coordinates with the Coumadin clinic regarding goal INR.  Cardiology is coordinating with their Coumadin clinic to make sure that he is followed up closely.  Home Health: None    Equipment/Devices: None    Discharge Condition: Improved and stable.   Code Status: DNR Diet recommendation:  Discharge Diet Orders (From admission, onward)     Start     Ordered   05/23/23 0000  Diet - low sodium heart healthy        05/23/23 1324   05/23/23 0000  Diet Carb Modified        05/23/23 1324             Discharge Diagnoses:  Principal Problem:   GI bleed Active Problems:   Long term current use of anticoagulant therapy   H/O mechanical aortic valve replacement   Type II diabetes mellitus with nephropathy (HCC)   CKD stage 2 due to type 2 diabetes mellitus (HCC)   Atrial fibrillation (HCC)   Occult GI bleeding   Brief Summary: 82 year old male, lives alone, independent, PMH of CAD s/p CABG, aortic stenosis s/p [employer] Jude mechanical AVR 2004, HTN, HLD, type II DM, 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  Admitted for suspected acute upper GI bleed.  [facility] and cardiology consulted.     Assessment & Plan:   Suspected acute upper GI bleed: History as noted above, black tarry stools for 3 to 4 days since Sunday PTA.  No BM in the last 2 days. Complicated by ongoing anticoagulation with Coumadin for mechanical AVR and permanent A-fib. Patient remained hemodynamically stable.  His hemoglobin dropped from baseline of 16-17 to 11-10 g range and stable. Patient remained anticoagulated through course of hospital admission (INR 2.8 > 2.5 > 2.6) He was treated with IV Protonix infusion, briefly held Coumadin and aspirin pending GI clearance, [facility] was consulted, underwent EGD 6/28 which was unremarkable without source of bleeding.   As per GI recommendations, initiated Coumadin on day of discharge, they recommend preferably avoiding aspirin indefinitely or at least for a week.  Aspirin currently discontinued and deferred to outpatient cardiology regarding resumption.  No further procedures planned and GI cleared patient for discharge home.  Tolerated diet. Patient reports that he last did Cologuard several years ago and no further workup due to advanced age.   Source of bleeding  really not determined.  However given medium sized hiatal hernia that was seen, switched Pepcid to Protonix.  Acute posthemorrhagic anemia: Secondary to GI bleed. Hemoglobin dropped from baseline 16-17 in February and early June 2024 to 11.3. Open early this morning was 10.2 down from 11.1 yesterday.  Repeated several hours later and confirmed stability at 10.4 prior to discharge.  No overt bleeding reported by patient.   Permanent A-fib/flutter Cardiology input appreciated.  As per cardiology follow-up today, okay to hold aspirin  for now and they feel that he can be on warfarin alone.  Discussed with Dr. Antoine Poche. Rate controlled and asymptomatic. Continue prior dose of home metoprolol.   St Jude mechanical aortic valve replacement 07/18/2003 Anticoagulation decision as above.   CAD s/p CABG Continue prior home dose of metoprolol, rosuvastatin.  No anginal symptoms.   Aspirin on hold and defer to outpatient cardiology regarding need for resumption.     AAA Stable on CTA chest 8/23 per cardiology note. Continue statins.  Aspirin discussion as above.   Hypertension: Continue prior home dose of metoprolol.  Controlled.   Type II DM with hyperglycemia: While hospitalized held p.o. meds.  Treated with NovoLog SSI.  CBGs mildly uncontrolled and fluctuating but reasonable. Patient developed abdominal pain and GI symptoms after first dose of Mounjaro.  This is currently held until close outpatient follow-up with PCP.  Patient was not planning to take it anyway until he went back to his PCP. Diabetes coordinator input appreciated: He reported that when his A1c was checked on 05/12/2023, he had been out of all DM medications for a month.  He was restarted on Mounjaro (stated that he had taken it in the past).  She told the DM coordinator that his PCP did not want to start insulins on him.  Patient was advised to follow-up with his PCP closely. Resumed home dose of glipizide and metformin at discharge.   Thrombocytopenia Chronic, mild and intermittent.  Stable.     Stage II CKD Stable.   Body mass index is 29.1 kg/m.      Consultants:   [facility]   Procedures:   EGD 6/28: Impression: Medium sized hiatal hernia.  Few gastric polyps.  Normal duodenal bulb, first portion of the duodenum, second portion of the duodenum and third portion of the duodenum.  Exam otherwise normal.  No specimens collected.   Discharge Instructions  Discharge Instructions     Call MD for:   Complete by: As directed     Recurrent vomiting of blood or coffee-ground material.  Recurrent blood in stools or black tarry stools.   Call MD for:  difficulty breathing, headache or visual disturbances   Complete by: As directed    Call MD for:  extreme fatigue   Complete by: As directed    Call MD for:  persistant dizziness or light-headedness   Complete by: As directed    Call MD for:  persistant nausea and vomiting   Complete by: As directed    Call MD for:  severe uncontrolled pain   Complete by: As directed    Call MD for:  temperature >100.4   Complete by: As directed    Diet - low sodium heart healthy   Complete by: As directed    Diet Carb Modified   Complete by: As directed    Increase activity slowly   Complete by: As directed         Medication List     STOP taking  these medications    aspirin 81 MG tablet   famotidine 20 MG tablet Commonly known as: PEPCID   tirzepatide 5 MG/0.5ML Pen Commonly known as: MOUNJARO       TAKE these medications    B-12 SL Place under the tongue daily.   Cholecalciferol 125 MCG (5000 UT) Chew Chew 5,000 Units by mouth daily. What changed: additional instructions   CINNAMON PO Take 500 mg by mouth in the morning and at bedtime.   clotrimazole-betamethasone cream Commonly known as: LOTRISONE Apply to rash 2 to 3 x /day   glipiZIDE 10 MG tablet Commonly known as: GLUCOTROL TAKE 1 TABLET BY MOUTH 3 TIMES A DAY BEFORE MEALS FOR DIABETES What changed: See the new instructions.   glucose blood test strip Commonly known as: ONE TOUCH ULTRA TEST CHECK BLOOD SUGAR ONCE DAILY   metFORMIN 500 MG 24 hr tablet Commonly known as: GLUCOPHAGE-XR Take 1 tablet (500 mg total) by mouth 2 (two) times daily with a meal. What changed: See the new instructions.   metoprolol succinate 50 MG 24 hr tablet Commonly known as: TOPROL-XL Take 1 tablet (50 mg total) by mouth daily. Take with or immediately following a meal. What changed: See the new  instructions.   pantoprazole 40 MG tablet Commonly known as: PROTONIX Take 1 tablet (40 mg total) by mouth daily. Start taking on: May 24, 2023   rosuvastatin 10 MG tablet Commonly known as: CRESTOR TAKE 1 TABLET BY MOUTH EVERY DAY   warfarin 5 MG tablet Commonly known as: COUMADIN Take as directed. If you are unsure how to take this medication, talk to your nurse or doctor. Original instructions: TAKE 1/2 TO 1 TABLET BY MOUTH DAILY OR AS DIRECTED What changed: See the new instructions.       No Known Allergies    Procedures/Studies: DG Chest 2 View  Result Date: 05/21/2023 CLINICAL DATA:  Shortness of breath EXAM: CHEST - 2 VIEW COMPARISON:  Previous studies including chest radiograph done on 09/04/2015 and CT done on 06/30/2022 FINDINGS: Transverse diameter of heart is increased. Thoracic aorta is tortuous. There is previous CABG. There is a prosthetic cardiac valve. There are no signs of pulmonary edema or focal pulmonary consolidation. There is fixed hiatal hernia. There is no pleural effusion or pneumothorax. IMPRESSION: Cardiomegaly. There are no signs of pulmonary edema or focal pulmonary consolidation. Hiatal hernia. Electronically Signed   By: Ernie Avena M.D.   On: 05/21/2023 18:02      Subjective: Denies complaints.  No chest pain, dyspnea, dizziness or lightheadedness.  Reportedly had a BM yesterday before the EGD and the stool was clearing up and not black like before.  Discharge Exam:  Vitals:   05/22/23 1343 05/22/23 2116 05/23/23 0151 05/23/23 0603  BP: (!) 150/80 (!) 106/58 108/73 136/88  Pulse: 65 84 64 83  Resp: 20 17 18 16   Temp: 97.8 F (36.6 C) 98.3 F (36.8 C) 98.1 F (36.7 C) 98.1 F (36.7 C)  TempSrc: Oral     SpO2: 96% 97% 99% 98%  Weight:      Height:        General exam: Elderly male, moderately built and overweight lying comfortably propped up in bed without distress.  Oral mucosa moist. Respiratory system: Clear to  auscultation. Respiratory effort normal. Cardiovascular system: S1 & S2 heard, irregularly irregular. No JVD, murmurs, rubs, gallops or clicks. No pedal edema.   Gastrointestinal system: Abdomen is nondistended, soft and nontender. No organomegaly or masses felt.  Normal bowel sounds heard. Central nervous system: Alert and oriented. No focal neurological deficits. Extremities: Symmetric 5 x 5 power. Skin: No rashes, lesions or ulcers Psychiatry: Judgement and insight appear normal. Mood & affect appropriate.     The results of significant diagnostics from this hospitalization (including imaging, microbiology, ancillary and laboratory) are listed below for reference.     Microbiology: No results found for this or any previous visit (from the past 240 hour(s)).   Labs: CBC: Recent Labs  Lab 05/21/23 1819 05/22/23 0130 05/22/23 1524 05/23/23 0413 05/23/23 1009  WBC 5.0 4.7 5.0 4.4 4.1  HGB 12.2* 11.3* 11.1* 10.2* 10.4*  HCT 34.2* 32.5* 32.3* 30.1* 30.6*  MCV 95.5 99.4 100.6* 100.7* 100.7*  PLT 139* 125* 121* 112* 114*    Basic Metabolic Panel: Recent Labs  Lab 05/21/23 1819 05/22/23 0130 05/23/23 0413  NA 136 135 136  K 3.7 3.8 3.8  CL 104 104 106  CO2 22 21* 25  GLUCOSE 250* 253* 180*  BUN 20 20 11   CREATININE 0.79 0.82 0.66  CALCIUM 9.1 8.9 8.3*    CBG: Recent Labs  Lab 05/22/23 1939 05/22/23 2331 05/23/23 0301 05/23/23 0744 05/23/23 1138  GLUCAP 231* 178* 163* 165* 218*    Thyroid function studies Recent Labs    05/22/23 0130  TSH 2.108    Time coordinating discharge: 25 minutes  SIGNED:  Marcellus Scott, MD,  FACP, FHM, [facility], [facility], [facility]   [facility] Phys[facility] Advisor [place]     To contact the attending provider between 7A-7P or the covering provider during after hours 7P-7A, please log into the web site [email or web address] and access using universal  password for that web site. If you do not have the password,  please call the hospital operator.

## 2023-05-23 NOTE — Progress Notes (Signed)
Mobility Specialist - Progress Note   05/23/23 0901  Mobility  Activity Ambulated with assistance in hallway  Level of Assistance Modified independent, requires aide device or extra time  Assistive Device Other (Comment) (IV Pole)  Distance Ambulated (ft) 350 ft  Activity Response Tolerated well  Mobility Specialist Start Time (ACUTE ONLY) 0849  Mobility Specialist Stop Time (ACUTE ONLY) 0900  Mobility Specialist Time Calculation (min) (ACUTE ONLY) 11 min   Pt received in bed and agreeable to mobility. No complaints during session. Pt to bed after session with all needs met.    [facility]

## 2023-05-24 ENCOUNTER — Encounter (HOSPITAL_COMMUNITY): Payer: Self-pay | Admitting: Gastroenterology

## 2023-05-25 ENCOUNTER — Telehealth: Payer: Self-pay

## 2023-05-25 NOTE — Telephone Encounter (Signed)
Called and spoke with pt. Made him aware that INR is due today. Pt states he will check it tomorrow. Made pt aware of the importance of checking as soon as possible since he was recently hospitalized. Pt verbalized understanding.

## 2023-05-26 ENCOUNTER — Ambulatory Visit (INDEPENDENT_AMBULATORY_CARE_PROVIDER_SITE_OTHER): Payer: Medicare Other | Admitting: Internal Medicine

## 2023-05-26 ENCOUNTER — Telehealth: Payer: Self-pay

## 2023-05-26 ENCOUNTER — Encounter: Payer: Self-pay | Admitting: Internal Medicine

## 2023-05-26 ENCOUNTER — Ambulatory Visit (INDEPENDENT_AMBULATORY_CARE_PROVIDER_SITE_OTHER): Payer: Medicare Other | Admitting: Cardiology

## 2023-05-26 VITALS — BP 110/60 | HR 100 | Temp 97.9°F | Resp 17 | Ht 68.0 in | Wt 197.6 lb

## 2023-05-26 DIAGNOSIS — N182 Chronic kidney disease, stage 2 (mild): Secondary | ICD-10-CM | POA: Diagnosis not present

## 2023-05-26 DIAGNOSIS — B379 Candidiasis, unspecified: Secondary | ICD-10-CM

## 2023-05-26 DIAGNOSIS — Z952 Presence of prosthetic heart valve: Secondary | ICD-10-CM

## 2023-05-26 DIAGNOSIS — Z79899 Other long term (current) drug therapy: Secondary | ICD-10-CM | POA: Diagnosis not present

## 2023-05-26 DIAGNOSIS — I10 Essential (primary) hypertension: Secondary | ICD-10-CM

## 2023-05-26 DIAGNOSIS — K922 Gastrointestinal hemorrhage, unspecified: Secondary | ICD-10-CM | POA: Diagnosis not present

## 2023-05-26 DIAGNOSIS — Z7901 Long term (current) use of anticoagulants: Secondary | ICD-10-CM

## 2023-05-26 DIAGNOSIS — E1122 Type 2 diabetes mellitus with diabetic chronic kidney disease: Secondary | ICD-10-CM | POA: Diagnosis not present

## 2023-05-26 DIAGNOSIS — D6869 Other thrombophilia: Secondary | ICD-10-CM | POA: Diagnosis not present

## 2023-05-26 DIAGNOSIS — I482 Chronic atrial fibrillation, unspecified: Secondary | ICD-10-CM

## 2023-05-26 DIAGNOSIS — I480 Paroxysmal atrial fibrillation: Secondary | ICD-10-CM | POA: Diagnosis not present

## 2023-05-26 LAB — POCT INR: INR: 1.5 — AB (ref 2.0–3.0)

## 2023-05-26 MED ORDER — CLOTRIMAZOLE-BETAMETHASONE 1-0.05 % EX CREA: TOPICAL_CREAM | CUTANEOUS | 3 refills | Status: AC

## 2023-05-26 MED ORDER — GLIPIZIDE 10 MG PO TABS: ORAL_TABLET | ORAL | 3 refills | Status: DC

## 2023-05-26 MED ORDER — FLUCONAZOLE 100 MG PO TABS: ORAL_TABLET | ORAL | 1 refills | Status: DC

## 2023-05-26 NOTE — Progress Notes (Signed)
[facility]   Future Appointments  Date Time Provider Department  06/15/2023  3:00 PM Chrystie Nose, MD [facility]  08/19/2023  9:30 AM [name], MD [facility]  11/30/2023 11:00 AM Adela Glimpse, NP [facility]  03/01/2024  9:30 AM [name], MD [facility]  06/01/2024 10:00 AM [name], MD [facility]        This very nice 82 y.o. 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 The patient now presents for follow up for transition from recent hospitalization         The patient who had been on long term Coumadin for Chronic Afib was sent to the ER for evaluation of dyspnea & weakness & was found to have a GI  Bleed & coumadin was withheld. EGD per GI consult was unrevealing but it was felt that it was likely due to his large Hiatal Hernia and his Pepcid was changed to Protonix.         The day after discharge  our clinical staff contacted the patient to assure stability and schedule a follow up appointment. The discharge summary, medications and diagnostic test results were reviewed before meeting with the patient. The patient was admitted for:    Gastrointestinal hemorrhage, unspecified gastrointestinal hemorrhage type - Plan: CBC with Differential/Platelet  Long term current use of anticoagulant therapy - Plan: CBC with Differential/Platelet, clotrimazole-betamethasone (LOTRISONE) cream  H/O mechanical aortic valve replacement  Type 2 diabetes mellitus with stage 2 chronic kidney disease, without long-term current use of insulin (HCC) - Plan: COMPLETE METABOLIC PANEL WITH GFR, glipiZIDE (GLUCOTROL) 10 MG tablet  Chronic atrial fibrillation (HCC)  Medication management - Plan: clotrimazole-betamethasone (LOTRISONE)  cream  Candida infection - Plan: clotrimazole-betamethasone (LOTRISONE) cream, fluconazole (DIFLUCAN) 100 MG tablet  Acquired thrombophilia (HCC) - Plan: clotrimazole-betamethasone (LOTRISONE) cream  Essential hypertension - Plan: clotrimazole-betamethasone (LOTRISONE) cream       Hospitalization discharge instructions and medications are reconciled with the patient.        Patient is also followed with Hypertension, Hyperlipidemia, Pre-Diabetes and Vitamin D Deficiency.        Patient is treated for HTN & BP has been controlled at home. Today's BP: 110/60. Patient has had no complaints of any cardiac type chest pain, palpitations, dyspnea/orthopnea/PND, dizziness, claudication, or dependent edema.       Hyperlipidemia is controlled with diet & meds. Patient denies myalgias or other med SE's. Last Lipids were  Lab Results  Component Value Date   CHOL 143 05/12/2023   HDL 40 05/12/2023   LDLCALC 78 05/12/2023   TRIG 151 (H) 05/12/2023   CHOLHDL 3.6 05/12/2023        Also, the patient has history of T2_NIDDM PreDiabetes and has had no symptoms of reactive hypoglycemia, diabetic polys, paresthesias or visual blurring.  Last A1c was  Lab Results  Component Value Date   HGBA1C 12.7 (H) 05/12/2023        Further, the patient also has history of Vitamin D Deficiency and supplements vitamin D without any suspected side-effects. Last vitamin D was   Lab Results  Component Value Date   VD25OH 79 05/12/2023     Current Outpatient Medications on File Prior to Visit  Medication Sig  Cholecalciferol 125 MCG (5000 UT) CHEW Chew 5,000 Units by mouth daily. (Patient taking differently: Chew 5,000 Units by mouth daily. 15,000 units)   CINNAMON PO Take 500 mg by mouth in the morning and at bedtime.    clotrimazole-betamethasone (LOTRISONE) cream Apply to rash 2 to 3 x /day   Cyanocobalamin (B-12 SL) Place under the tongue daily.   glipiZIDE (GLUCOTROL) 10 MG tablet TAKE 1 TABLET BY  MOUTH 3 TIMES A DAY BEFORE MEALS FOR DIABETES (Patient taking differently: Take 10 mg by mouth 3 (three) times daily.)   glucose blood (ONE TOUCH ULTRA TEST) test strip CHECK BLOOD SUGAR ONCE DAILY   metFORMIN (GLUCOPHAGE-XR) 500 MG 24 hr tablet Take 1 tablet (500 mg total) by mouth 2 (two) times daily with a meal.   metoprolol succinate (TOPROL-XL) 50 MG 24 hr tablet Take 1 tablet (50 mg total) by mouth daily. Take with or immediately following a meal.   pantoprazole (PROTONIX) 40 MG tablet Take 1 tablet (40 mg total) by mouth daily.   rosuvastatin (CRESTOR) 10 MG tablet TAKE 1 TABLET BY MOUTH EVERY DAY (Patient taking differently: Take 10 mg by mouth daily.)   warfarin (COUMADIN) 5 MG tablet TAKE 1/2 TO 1 TABLET BY MOUTH DAILY OR AS DIRECTED (Patient taking differently: Take 2.5-5 mg by mouth daily.)    No Known Allergies    PMHx:   Past Medical History:  Diagnosis Date   Anemia    AS (aortic stenosis)    valve replacement-st. jude mechanical valve   ASHD (arteriosclerotic heart disease)    CAD (coronary artery disease), native coronary artery    CABG 07/18/2003   Cancer (HCC)    prostate   GERD (gastroesophageal reflux disease)    History of nuclear stress test 02/12/10   EF 67%, normal perfusion   Hyperlipidemia    Hypertension    Morbid obesity (HCC)    Type II or unspecified type diabetes mellitus without mention of complication, not stated as uncontrolled    Umbilical hernia    Vitamin B12 deficiency    Vitamin D deficiency    Immunization History  Administered Date(s) Administered   Fluad Quad(high Dose 65+) 10/14/2020, 09/07/2021   Influenza Whole 09/01/2012   Influenza, High Dose Seasonal PF 09/27/2015, 10/14/2020, 09/23/2022   Influenza-Unspecified 08/25/2016, 08/24/2018, 09/13/2019   PFIZER Comirnaty(Gray Top)Covid-19 Tri-Sucrose Vaccine 12/18/2020   PFIZER(Purple Top)SARS-COV-2 Vaccination 03/18/2020, 04/13/2020   Pneumococcal Conjugate-13 12/18/2015    Pneumococcal Polysaccharide-23 09/01/2012   Tdap 11/25/2007   Zoster Recombinant(Shingrix) 02/25/2023, 04/28/2023   Past Surgical History:  Procedure Laterality Date   AORTIC VALVE REPLACEMENT  07/18/2003   22mm ATS-AP valve   AORTIC VALVE REPLACEMENT (AVR)/CORONARY ARTERY BYPASS GRAFTING (CABG)  07/18/2003   AVR; bypass x 2-SVG to first diag, SVG to posterior descending   CARDIAC CATHETERIZATION  07/13/2003   AS-aortic valve gradient , valve area 1.1; obstruction of ostium of posterior lateral branch with obstruction in the ostium of first diagnonal   ESOPHAGOGASTRODUODENOSCOPY (EGD) WITH PROPOFOL N/A 05/22/2023   Procedure: ESOPHAGOGASTRODUODENOSCOPY (EGD) WITH PROPOFOL;  Surgeon: Vida Rigger, MD;  Location: [facility];  Service: Gastroenterology;  Laterality: N/A;   HERNIA REPAIR  09/11/2011   RIH, umbilical   NM MYOCAR PERF WALL MOTION  02/12/2010   protocol:Bruce, EF 67%, exercise cap 7 METS, low risk scan   TEE WITHOUT CARDIOVERSION  07/18/2003   no evidence of PE, Aortic valve heavily calcified,    TRANSTHORACIC ECHOCARDIOGRAM  02/21/2013   EF60-65%, Septal motion showed  paradox. atrium moderately dilated   FHx:    Reviewed / unchanged  SHx:    Reviewed / unchanged  Systems Review:  Constitutional: Denies fever, chills, wt changes, headaches, insomnia, fatigue, night sweats, change in appetite. Eyes: Denies redness, blurred vision, diplopia, discharge, itchy, watery eyes.  ENT: Denies discharge, congestion, post nasal drip, epistaxis, sore throat, earache, hearing loss, dental pain, tinnitus, vertigo, sinus pain, snoring.  CV: Denies chest pain, palpitations, irregular heartbeat, syncope, dyspnea, diaphoresis, orthopnea, PND, claudication or edema. Respiratory: denies cough, dyspnea, DOE, pleurisy, hoarseness, laryngitis, wheezing.  Gastrointestinal: Denies dysphagia, odynophagia, heartburn, reflux, water brash, abdominal pain or cramps, nausea, vomiting, bloating,  diarrhea, constipation, hematemesis, melena, hematochezia  or hemorrhoids. Genitourinary: Denies dysuria, frequency, urgency, nocturia, hesitancy, discharge, hematuria or flank pain. Musculoskeletal: Denies arthralgias, myalgias, stiffness, jt. swelling, pain, limping or strain/sprain.  Skin: Denies pruritus, rash, hives, warts, acne, eczema or change in skin lesion(s). Neuro: No weakness, tremor, incoordination, spasms, paresthesia or pain. Psychiatric: Denies confusion, memory loss or sensory loss. Endo: Denies change in weight, skin or hair change.  Heme/Lymph: No excessive bleeding, bruising or enlarged lymph nodes.  Physical Exam  BP 110/60   Pulse 100   Temp 97.9 F (36.6 C)   Resp 17   Ht 5\' 8"  (1.727 m)   Wt 197 lb 9.6 oz (89.6 kg)   SpO2 97%   BMI 30.04 kg/m   Appears well nourished, well groomed  and in no distress.  Eyes: PERRLA, EOMs, conjunctiva no swelling or erythema. Sinuses: No frontal/maxillary tenderness ENT/Mouth: EAC's clear, TM's nl w/o erythema, bulging. Nares clear w/o erythema, swelling, exudates. Oropharynx clear without erythema or exudates. Oral hygiene is good. Tongue normal, non obstructing. Hearing intact.  Neck: Supple. Thyroid nl. Car 2+/2+ without bruits, nodes or JVD. Chest: Respirations nl with BS clear & equal w/o rales, rhonchi, wheezing or stridor.  Cor: Heart sounds normal w/ regular rate and rhythm without sig. murmurs, gallops, clicks or rubs. Peripheral pulses normal and equal  without edema.  Abdomen: Soft & bowel sounds normal. Non-tender w/o guarding, rebound, hernias, masses or organomegaly.  Lymphatics: Unremarkable.  Musculoskeletal: Full ROM all peripheral extremities, joint stability, 5/5 strength and normal gait.  Skin: Warm, dry without exposed rashes, lesions or ecchymosis apparent.  Neuro: Cranial nerves intact, reflexes equal bilaterally. Sensory-motor testing grossly intact. Tendon reflexes grossly intact.  Pysch: Alert &  oriented x 3.  Insight and judgement nl & appropriate. No ideations.  Assessment and Plan:   1. Gastrointestinal hemorrhage  - CBC with Differential/Platelet  - Coumadin resumed at discharge & ASA d/c'd    2. Long term current use of anticoagulant therapy  - CBC with Differential/Platelet   3. H/O mechanical aortic valve replacement   4. Type 2 diabetes mellitus with stage 2 chronic kidney  disease, without long-term current use of insulin (HCC)  - Continue diet, exercise, lifestyle modifications.  - Monitor appropriate labs.     - COMPLETE METABOLIC PANEL WITH GFR   5. Chronic atrial fibrillation (HCC)   6. Medication management   7. Candida infection  - clotrimazole-betamethasone (LOTRISONE) cream;  Apply to rash 2 to 3 x /day   Dispense: 45 g; Refill: 3  - fluconazole (DIFLUCAN) 100 MG tablet;  Take  1 tablet  Daily for 2 weeks for Yeast Skin Infection   Dispense: 14 tablet; Refill: 1   8. Acquired thrombophilia (HCC)  - CBC with Differential/Platelet   9. Essential hypertension  - COMPLETE METABOLIC PANEL WITH GFR  -  Continue medication, monitor blood pressure at home.  - Continue DASH diet.  Reminder to go to the ER if any CP,  SOB, nausea, dizziness, severe HA, changes vision/speech.           Discussed  regular exercise, BP monitoring, weight control to achieve/maintain BMI less than 25 and discussed meds and SE's. Recommended labs to assess and monitor clinical status with further disposition pending results of labs. Over 30 minutes of exam, counseling, chart review was performed.   Marinus Maw, MD

## 2023-05-26 NOTE — Patient Instructions (Signed)
Description   Spoke with pt and advised to take 1 tablet of warfarin today and 1 tablet of warfarin tomorrow then continue taking warfarin 1/2 tablet daily. Recheck INR in 1 week.  (Prefers 15th & last day of month).  [facility] [phone]

## 2023-05-26 NOTE — Patient Instructions (Signed)

## 2023-05-26 NOTE — Telephone Encounter (Signed)
Lpmtcb and discuss INR result. °

## 2023-05-27 LAB — COMPLETE METABOLIC PANEL WITHOUT GFR
AG Ratio: 1.9 (calc) (ref 1.0–2.5)
ALT: 14 U/L (ref 9–46)
AST: 10 U/L (ref 10–35)
Albumin: 4.1 g/dL (ref 3.6–5.1)
Alkaline phosphatase (APISO): 83 U/L (ref 35–144)
BUN: 10 mg/dL (ref 7–25)
CO2: 23 mmol/L (ref 20–32)
Calcium: 9.2 mg/dL (ref 8.6–10.3)
Chloride: 105 mmol/L (ref 98–110)
Creat: 1.06 mg/dL (ref 0.70–1.22)
Globulin: 2.2 g/dL (ref 1.9–3.7)
Glucose, Bld: 248 mg/dL — ABNORMAL HIGH (ref 65–99)
Potassium: 4.8 mmol/L (ref 3.5–5.3)
Sodium: 142 mmol/L (ref 135–146)
Total Bilirubin: 1.3 mg/dL — ABNORMAL HIGH (ref 0.2–1.2)
Total Protein: 6.3 g/dL (ref 6.1–8.1)
eGFR: 71 mL/min/{1.73_m2}

## 2023-05-27 LAB — CBC WITH DIFFERENTIAL/PLATELET
Absolute Monocytes: 490 {cells}/uL (ref 200–950)
Basophils Absolute: 40 {cells}/uL (ref 0–200)
Basophils Relative: 0.7 %
Eosinophils Absolute: 68 {cells}/uL (ref 15–500)
Eosinophils Relative: 1.2 %
HCT: 34.7 % — ABNORMAL LOW (ref 38.5–50.0)
Hemoglobin: 11.5 g/dL — ABNORMAL LOW (ref 13.2–17.1)
Lymphs Abs: 1049 {cells}/uL (ref 850–3900)
MCH: 33.8 pg — ABNORMAL HIGH (ref 27.0–33.0)
MCHC: 33.1 g/dL (ref 32.0–36.0)
MCV: 102.1 fL — ABNORMAL HIGH (ref 80.0–100.0)
MPV: 11.9 fL (ref 7.5–12.5)
Monocytes Relative: 8.6 %
Neutro Abs: 4053 {cells}/uL (ref 1500–7800)
Neutrophils Relative %: 71.1 %
Platelets: 190 10*3/uL (ref 140–400)
RBC: 3.4 Million/uL — ABNORMAL LOW (ref 4.20–5.80)
RDW: 13.6 % (ref 11.0–15.0)
Total Lymphocyte: 18.4 %
WBC: 5.7 10*3/uL (ref 3.8–10.8)

## 2023-05-27 NOTE — Progress Notes (Signed)
^<^<^<^<^<^<^<^<^<^<^<^<^<^<^<^<^<^<^<^<^<^<^<^<^<^<^<^<^<^<^<^<^<^<^<^<^ ^>^>^>^>^>^>^>^>^>^>^>>^>^>^>^>^>^>^>^>^>^>^>^>^>^>^>^>^>^>^>^>^>^>^>^>^>  -    Mild anemia is about the same & Stable   ^<^<^<^<^<^<^<^<^<^<^<^<^<^<^<^<^<^<^<^<^<^<^<^<^<^<^<^<^<^<^<^<^<^<^<^<^ ^>^>^>^>^>^>^>^>^>^>^>^>^>^>^>^>^>^>^>^>^>^>^>^>^>^>^>^>^>^>^>^>^>^>^>^>^  -  Glucose = 248 mg%  is too       HIGH     !   ^<^<^<^<^<^<^<^<^<^<^<^<^<^<^<^<^<^<^<^<^<^<^<^<^<^<^<^<^<^<^<^<^<^<^<^<^ ^>^>^>^>^>^>^>^>^>^>^>>^>^>^>^>^>^>^>^>^>^>^>^>^>^>^>^>^>^>^>^>^>^>^>^>^>  -  All Else - - Kidneys - Electrolytes - Liver   - all  Normal / OK  ^<^<^<^<^<^<^<^<^<^<^<^<^<^<^<^<^<^<^<^<^<^<^<^<^<^<^<^<^<^<^<^<^<^<^<^<^ ^>^>^>^>^>^>^>^>^>^>^>>^>^>^>^>^>^>^>^>^>^>^>^>^>^>^>^>^>^>^>^>^>^>^>^>^>

## 2023-06-01 ENCOUNTER — Other Ambulatory Visit: Payer: Self-pay

## 2023-06-01 DIAGNOSIS — N182 Chronic kidney disease, stage 2 (mild): Secondary | ICD-10-CM

## 2023-06-01 DIAGNOSIS — E1122 Type 2 diabetes mellitus with diabetic chronic kidney disease: Secondary | ICD-10-CM

## 2023-06-01 MED ORDER — GLUCOSE BLOOD VI STRP: ORAL_STRIP | 3 refills | Status: AC

## 2023-06-02 ENCOUNTER — Telehealth: Payer: Self-pay | Admitting: Internal Medicine

## 2023-06-02 ENCOUNTER — Ambulatory Visit (INDEPENDENT_AMBULATORY_CARE_PROVIDER_SITE_OTHER): Payer: Medicare Other | Admitting: Cardiology

## 2023-06-02 DIAGNOSIS — Z952 Presence of prosthetic heart valve: Secondary | ICD-10-CM

## 2023-06-02 DIAGNOSIS — Z7901 Long term (current) use of anticoagulants: Secondary | ICD-10-CM | POA: Diagnosis not present

## 2023-06-02 LAB — POCT INR: INR: 6.6 — AB (ref 2.0–3.0)

## 2023-06-02 NOTE — Telephone Encounter (Signed)
Patient states someone transferred him to the main line because he just had his coumadin checked and his INR was out of range. Patient would like to speak with Casimiro Needle Dapp if at all possible.

## 2023-06-05 ENCOUNTER — Ambulatory Visit (INDEPENDENT_AMBULATORY_CARE_PROVIDER_SITE_OTHER): Payer: Medicare Other | Admitting: Cardiovascular Disease

## 2023-06-05 DIAGNOSIS — Z952 Presence of prosthetic heart valve: Secondary | ICD-10-CM

## 2023-06-05 DIAGNOSIS — Z7901 Long term (current) use of anticoagulants: Secondary | ICD-10-CM | POA: Diagnosis not present

## 2023-06-05 LAB — POCT INR: INR: 5 — AB (ref 2.0–3.0)

## 2023-06-08 ENCOUNTER — Ambulatory Visit (INDEPENDENT_AMBULATORY_CARE_PROVIDER_SITE_OTHER): Payer: Medicare Other

## 2023-06-08 DIAGNOSIS — Z5181 Encounter for therapeutic drug level monitoring: Secondary | ICD-10-CM

## 2023-06-08 LAB — POCT INR: INR: 4.3 — AB (ref 2.0–3.0)

## 2023-06-08 NOTE — Patient Instructions (Signed)
Description   Spoke with pt and advised to HOLD TODAY and TOMORROW  then continue taking warfarin 1/2 tablet daily.  Recheck INR on Fridays.  (Prefers 15th & last day of month).  [facility] [phone]

## 2023-06-10 ENCOUNTER — Other Ambulatory Visit: Payer: Self-pay | Admitting: Internal Medicine

## 2023-06-10 DIAGNOSIS — E1165 Type 2 diabetes mellitus with hyperglycemia: Secondary | ICD-10-CM

## 2023-06-10 DIAGNOSIS — E1122 Type 2 diabetes mellitus with diabetic chronic kidney disease: Secondary | ICD-10-CM

## 2023-06-12 ENCOUNTER — Ambulatory Visit (INDEPENDENT_AMBULATORY_CARE_PROVIDER_SITE_OTHER): Payer: Medicare Other | Admitting: Cardiology

## 2023-06-12 DIAGNOSIS — Z952 Presence of prosthetic heart valve: Secondary | ICD-10-CM | POA: Diagnosis not present

## 2023-06-12 DIAGNOSIS — Z7901 Long term (current) use of anticoagulants: Secondary | ICD-10-CM

## 2023-06-12 LAB — POCT INR: INR: 1.9 — AB (ref 2.0–3.0)

## 2023-06-15 ENCOUNTER — Ambulatory Visit: Payer: Medicare Other | Admitting: Internal Medicine

## 2023-06-15 ENCOUNTER — Encounter: Payer: Self-pay | Admitting: Internal Medicine

## 2023-06-15 VITALS — BP 112/68 | HR 32 | Ht 68.0 in | Wt 201.4 lb

## 2023-06-15 DIAGNOSIS — Z7901 Long term (current) use of anticoagulants: Secondary | ICD-10-CM

## 2023-06-15 DIAGNOSIS — I712 Thoracic aortic aneurysm, without rupture, unspecified: Secondary | ICD-10-CM

## 2023-06-15 DIAGNOSIS — I4821 Permanent atrial fibrillation: Secondary | ICD-10-CM

## 2023-06-15 DIAGNOSIS — Z952 Presence of prosthetic heart valve: Secondary | ICD-10-CM | POA: Diagnosis not present

## 2023-06-15 NOTE — Patient Instructions (Signed)
Medication Instructions:  Your physician recommends that you continue on your current medications as directed. Please refer to the Current Medication list given to you today.  *If you need a refill on your cardiac medications before your next appointment, please call your pharmacy*   Follow-Up: At [facility], you and your health needs are our priority.  As part of our continuing mission to provide you with exceptional heart care, we have created designated Provider Care Teams.  These Care Teams include your primary Cardiologist (physician) and Advanced Practice Providers (APPs -  Physician Assistants and Nurse Practitioners) who all work together to provide you with the care you need, when you need it.  We recommend signing up for the patient portal called "MyChart".  Sign up information is provided on this After Visit Summary.  MyChart is used to connect with patients for Virtual Visits (Telemedicine).  Patients are able to view lab/test results, encounter notes, upcoming appointments, etc.  Non-urgent messages can be sent to your provider as well.   To learn more about what you can do with MyChart, go to [email or web address].    Your next appointment:   12 month(s)  Provider:   Kenneth C Hilty, MD   

## 2023-06-15 NOTE — Progress Notes (Signed)
OFFICE NOTE  Chief Complaint:  No complaints  Primary Care Physician: [name], MD  HPI:  Billy Reilly is a 82 year old gentleman, previously followed by Dr. Clarene Duke, with a history of coronary disease. In 2004, he had CABG and replacement of his aortic valve with a mechanical prosthesis. He had 2-vessel bypass at the time with saphenous vein graft to the first diagonal and saphenous vein graft to the posterior descending by Dr. Donata Clay, and replacement of the aortic valve with a 22 mm ATS AP valve. He has done well with valve replacement to this point without any recurrent angina, shortness of breath, palpitations, presyncope, or syncopal symptoms. INR checked today in the office is 2.6 and it has been stable on Coumadin without dose adjustments for at least a year. 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 Recently he was started on Farxiga, which is resulted in excellent weight loss as well as control of his blood sugars.  Overall he feels quite well.  I saw Billy Reilly back today in the office. He is here for DOT visit. When I last saw him he was doing well denies any chest pain or shortness of breath. That holds true today. His warfarin levels have been well controlled his INR was 3.1 today. He has not had stress testing in over 5 years and his bypass was in 2004. 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  Billy Reilly returns today for follow-up. I last saw him 6 months ago for DOT physical. At that time we performed a nuclear stress test which was negative for ischemia. 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 He denies any chest pain or worsening shortness of breath. Blood pressures been well controlled.  I saw Billy Reilly back today in the office for follow-up. He returns for a DOT letter. He continues to do well after bypass surgery  mechanical valve replacement. His INRs have been therapeutic. He is completely asymptomatic. 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 He had a stress test last year which was negative for ischemia. He is due for repeat echocardiogram to reassess his valve gradients this year.  01/20/2017  Billy Reilly was seen today in follow-up. He is due for renewal of his DOT physical. From a cardiac standpoint he seems stable. Denies a chest pain or worsening shortness of breath. An echocardiogram last year which showed a stable aortic valve gradient. 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 He is on warfarin and INR today was 2.8. EKG shows sinus rhythm.  12/23/2017  Billy Reilly was seen today for routine follow-up.  He continues to work and requires a renewal of his DOT letter.  He has an upcoming echo at the end of February to assess the stability of his valve.  He denies any chest pain worsening shortness of breath.  His INR was a slightly low he needs some adjustment.  EKG is unchanged.  01/10/2019  Billy Reilly returns today for follow-up.  Overall he continues to do well.  He still working for [employer].  He drives oil trucks.  He continues to get annual DOT physicals.  He has an upcoming physical.  His echo last year showed a stable LVEF with normally functioning mechanical aortic valve.  His INR  was therapeutic today on warfarin.  He denies any worsening shortness of breath.  He has had no further chest pain.  He is now more than 15 years out from his bypass surgery.  I did review lab work from December 2019 which showed total cholesterol 146, HDL 40, LDL 89 and triglycerides 81.  His hemoglobin A1c was 8.4.  We discussed diet as well does sound like he has somewhat of increased saturated fats in his diet which could be further optimized.  12/05/2019  Billy Reilly is seen today in follow-up.  Overall he remains asymptomatic.  Denies any new or  worsening chest pain or shortness of breath.  Recently saw his primary care provider and needs clearance for DOT physical.  Today he was noted incidentally to have atrial flutter with borderline ventricular response on his EKG.  Review of prior EKGs did not demonstrate this.  Fortunately he is anticoagulated on warfarin for his mechanical aortic valve.  He has had therapeutic INRs with this and is due for an INR today.  02/24/2020  Billy Reilly is seen today in follow-up. When I last saw him he was in newly recognized atrial flutter. He stated he was asymptomatic. I repeated his echo which showed normal LVEF 65 to 70% however there is severe left atrial enlargement and moderate right atrial enlargement. His mechanical aortic valve seems to be working appropriately. 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 He reports he continues to be asymptomatic. Today he is noted to be in atrial fibrillation.  10/24/2021  Billy Reilly returns today for follow-up.  He is without complaints.  He denies any chest pain or worsening shortness of breath.  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  He is also been followed by his primary care provider closely.  His A1c had increased recently up to 8.4% but he was taking only apparently half of the 25 mg Jardiance tablet to save money.  Total cholesterol was 130 this summer, HDL 43, triglycerides 88 and LDL 70.  10/21/2022  Billy Reilly is seen today in follow-up.  Overall he says he is feeling well.  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  His mechanical aortic valve was noted.  His last echo to assess this was in 2019 which showed normal function of the mechanical aortic valve and normal LVEF 55 to 60%.  Mean gradient across the valve was 20 mmHg.  06/15/2023  Billy Reilly is seen today in follow-up.  Since I  last saw him he has retired.  He was recently in the hospital for several days with presumed acute GI bleeding, including several days of abdominal pain and decreased appetite with black stools however no clear GI etiology was found with EGD.  He has resumed anticoagulation.  His lowest hemoglobin was 10.2 but recently was 11.5.  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  PMHx:  Past Medical History:  Diagnosis Date   Anemia    AS (aortic stenosis)    valve replacement-st. jude mechanical valve   ASHD (arteriosclerotic heart disease)    CAD (coronary artery disease), native coronary artery    CABG 07/18/2003  Cancer [facility])    prostate   GERD (gastroesophageal reflux disease)    History of nuclear stress test 02/12/10   EF 67%, normal perfusion   Hyperlipidemia    Hypertension    Morbid obesity (HCC)    Type II or unspecified type diabetes mellitus without mention of complication, not stated as uncontrolled    Umbilical hernia    Vitamin B12 deficiency    Vitamin D deficiency     Past Surgical History:  Procedure Laterality Date   AORTIC VALVE REPLACEMENT  07/18/2003   22mm ATS-AP valve   AORTIC VALVE REPLACEMENT (AVR)/CORONARY ARTERY BYPASS GRAFTING (CABG)  07/18/2003   AVR; bypass x 2-SVG to first diag, SVG to posterior descending   CARDIAC CATHETERIZATION  07/13/2003   AS-aortic valve gradient , valve area 1.1; obstruction of ostium of posterior lateral branch with obstruction in the ostium of first diagnonal   ESOPHAGOGASTRODUODENOSCOPY (EGD) WITH PROPOFOL N/A 05/22/2023   Procedure: ESOPHAGOGASTRODUODENOSCOPY (EGD) WITH PROPOFOL;  Surgeon: Vida Rigger, MD;  Location: [facility];  Service: Gastroenterology;  Laterality: N/A;   HERNIA REPAIR  09/11/2011   RIH, umbilical   NM MYOCAR PERF WALL MOTION  02/12/2010   protocol:Bruce, EF 67%, exercise cap 7  METS, low risk scan   TEE WITHOUT CARDIOVERSION  07/18/2003   no evidence of PE, Aortic valve heavily calcified,    TRANSTHORACIC ECHOCARDIOGRAM  02/21/2013   EF60-65%, Septal motion showed paradox. atrium moderately dilated    FAMHx:  Family History  Problem Relation Age of Onset   Stroke Mother    Heart attack Father    Heart disease Father    Cancer Brother    Cancer Sister     SOCHx:   reports that he has never smoked. He has never used smokeless tobacco. He reports that he does not drink alcohol and does not use drugs.  ALLERGIES:  No Known Allergies  ROS: Pertinent items noted in HPI and remainder of comprehensive ROS otherwise negative.  HOME MEDS: Current Outpatient Medications  Medication Sig Dispense Refill   BERBERINE CHLORIDE PO Take by mouth.     Cholecalciferol 125 MCG (5000 UT) CHEW Chew 5,000 Units by mouth daily. (Patient taking differently: Chew 5,000 Units by mouth daily. 15,000 units)     CINNAMON PO Take 500 mg by mouth in the morning and at bedtime.      clotrimazole-betamethasone (LOTRISONE) cream Apply to rash 2 to 3 x /day 45 g 3   Cyanocobalamin (B-12 SL) Place under the tongue daily.     fluconazole (DIFLUCAN) 100 MG tablet Take  1 tablet  Daily for 2 weeks for Yeast Skin Infection 14 tablet 1   glipiZIDE (GLUCOTROL) 10 MG tablet Take  1 tablet   2 x /day  with Meals  for Diabetes 180 tablet 3   glucose blood (ONE TOUCH ULTRA TEST) test strip CHECK BLOOD SUGAR ONCE DAILY 100 each 3   metFORMIN (GLUCOPHAGE-XR) 500 MG 24 hr tablet Take 1 tablet (500 mg total) by mouth 2 (two) times daily with a meal.     metoprolol succinate (TOPROL-XL) 50 MG 24 hr tablet Take 1 tablet (50 mg total) by mouth daily. Take with or immediately following a meal.     pantoprazole (PROTONIX) 40 MG tablet Take 1 tablet (40 mg total) by mouth daily. 30 tablet 1   rosuvastatin (CRESTOR) 10 MG tablet TAKE 1 TABLET BY MOUTH EVERY DAY (Patient taking differently: Take 10 mg by  mouth daily.) 90 tablet 3  warfarin (COUMADIN) 5 MG tablet TAKE 1/2 TO 1 TABLET BY MOUTH DAILY OR AS DIRECTED (Patient taking differently: Take 2.5-5 mg by mouth daily.) 90 tablet 0   No current facility-administered medications for this visit.    LABS/IMAGING: No results found for this or any previous visit (from the past 48 hour(s)). No results found.  VITALS: BP 112/68 (BP Location: Left Arm, Patient Position: Sitting, Cuff Size: Normal)   Pulse (!) 32   Ht 5\' 8"  (1.727 m)   Wt 201 lb 6.4 oz (91.4 kg)   SpO2 98%   BMI 30.62 kg/m   EXAM: Deferred  EKG: Deferred  ASSESSMENT: Permanent atrial fibrillation Recent possible GI Bleed CAD status post 2 vessel CABG (SVG to diagonal SVG to PDA) in 2004 Status post ATS mechanical aortic valve Lifetime anticoagulation on warfarin HTN Dyslipidemia DM2 Encounter for DOT physical Ascending aortic aneurysm-4.1-4.4 cm  PLAN: 1.   Mr. Totty recently hospitalized with possible GI bleed.  He is in permanent A-fib on warfarin.  His echo showed stable aortic valve gradients.  His ascending aorta measures 44 mm.  This is also stable.  We have been following this by CT and will plan another CT angiogram in 1 year.  Blood pressure is controlled today.  No changes to his medications.  Follow-up with me annually or sooner as necessary.  Chrystie Nose, MD, [facility], FACP  [place]  [facility]  Medical Director of the [facility]  Card[facility]ovascular [facility] Reduction Clinic Diplomate of the American Board of Clinical Lipidology Attending Cardiologist  Direct Dial: [phone]  Fax: [phone]  Website:  [email or web address] 06/15/2023, 3:23 PM

## 2023-06-18 ENCOUNTER — Ambulatory Visit (INDEPENDENT_AMBULATORY_CARE_PROVIDER_SITE_OTHER): Payer: Medicare Other

## 2023-06-18 DIAGNOSIS — Z5181 Encounter for therapeutic drug level monitoring: Secondary | ICD-10-CM

## 2023-06-18 LAB — POCT INR: INR: 3.2 — AB (ref 2.0–3.0)

## 2023-06-18 NOTE — Patient Instructions (Signed)
Description   Eat greens today and continue taking warfarin 1/2 tablet daily.  Recheck INR in 1 week (Prefers 15th & last day of month).  Coumadin Clinic [phone]

## 2023-06-22 ENCOUNTER — Ambulatory Visit: Payer: Medicare Other | Admitting: Nurse Practitioner

## 2023-06-25 ENCOUNTER — Telehealth: Payer: Self-pay | Admitting: Internal Medicine

## 2023-06-25 ENCOUNTER — Ambulatory Visit: Payer: Medicare Other | Attending: Internal Medicine

## 2023-06-25 DIAGNOSIS — Z7901 Long term (current) use of anticoagulants: Secondary | ICD-10-CM

## 2023-06-25 DIAGNOSIS — Z952 Presence of prosthetic heart valve: Secondary | ICD-10-CM

## 2023-06-25 DIAGNOSIS — I4821 Permanent atrial fibrillation: Secondary | ICD-10-CM | POA: Diagnosis not present

## 2023-06-25 LAB — PROTIME-INR
INR: 8.2 (ref 0.9–1.2)
Prothrombin Time: 79 s — ABNORMAL HIGH (ref 9.1–12.0)

## 2023-06-25 NOTE — Telephone Encounter (Signed)
[facility] is calling to report a critical lab report for patient

## 2023-06-25 NOTE — Telephone Encounter (Signed)
[facility] called with critical INR of 8.2. Informed Co[facility]

## 2023-06-25 NOTE — Patient Instructions (Signed)
Description   POC INR >8.0. STAR lab 8.2. Instructed pt to HOLD Warfarin Thursday, Friday, Saturday, and Sunday. Then, Resume taking warfarin 1/2 tablet daily.  Seek immediate medical attention if signs or symptoms of bleeding occur.  Recheck INR on Tuesday, 06/30/23.  (Prefers 15th & last day of month).  Coumadin Clinic [phone]

## 2023-06-25 NOTE — Progress Notes (Signed)
This encounter was created in error - please disregard.

## 2023-06-29 ENCOUNTER — Ambulatory Visit (HOSPITAL_COMMUNITY): Payer: Medicare Other

## 2023-06-30 ENCOUNTER — Ambulatory Visit (INDEPENDENT_AMBULATORY_CARE_PROVIDER_SITE_OTHER): Payer: Medicare Other | Admitting: Internal Medicine

## 2023-06-30 ENCOUNTER — Telehealth: Payer: Self-pay

## 2023-06-30 ENCOUNTER — Other Ambulatory Visit: Payer: Self-pay | Admitting: Internal Medicine

## 2023-06-30 DIAGNOSIS — Z952 Presence of prosthetic heart valve: Secondary | ICD-10-CM

## 2023-06-30 DIAGNOSIS — Z7901 Long term (current) use of anticoagulants: Secondary | ICD-10-CM

## 2023-06-30 DIAGNOSIS — I712 Thoracic aortic aneurysm, without rupture, unspecified: Secondary | ICD-10-CM

## 2023-06-30 LAB — POCT INR: INR: 1.5 — AB (ref 2.0–3.0)

## 2023-06-30 NOTE — Telephone Encounter (Signed)
INR due today. Called pt, no answer. Left message on voicemail.

## 2023-07-04 ENCOUNTER — Other Ambulatory Visit: Payer: Self-pay | Admitting: Internal Medicine

## 2023-07-07 ENCOUNTER — Ambulatory Visit (INDEPENDENT_AMBULATORY_CARE_PROVIDER_SITE_OTHER): Payer: Medicare Other | Admitting: Cardiovascular Disease

## 2023-07-07 DIAGNOSIS — Z952 Presence of prosthetic heart valve: Secondary | ICD-10-CM | POA: Diagnosis not present

## 2023-07-07 DIAGNOSIS — Z7901 Long term (current) use of anticoagulants: Secondary | ICD-10-CM | POA: Diagnosis not present

## 2023-07-07 LAB — POCT INR: INR: 1.8 — AB (ref 2.0–3.0)

## 2023-07-13 ENCOUNTER — Other Ambulatory Visit: Payer: Self-pay

## 2023-07-13 DIAGNOSIS — E1122 Type 2 diabetes mellitus with diabetic chronic kidney disease: Secondary | ICD-10-CM

## 2023-07-13 DIAGNOSIS — N182 Chronic kidney disease, stage 2 (mild): Secondary | ICD-10-CM

## 2023-07-13 MED ORDER — GLIPIZIDE 10 MG PO TABS: ORAL_TABLET | ORAL | 3 refills | Status: DC

## 2023-07-20 ENCOUNTER — Other Ambulatory Visit: Payer: Self-pay | Admitting: Internal Medicine

## 2023-07-20 MED ORDER — WARFARIN SODIUM 5 MG PO TABS: ORAL_TABLET | ORAL | 0 refills | Status: DC

## 2023-07-23 ENCOUNTER — Ambulatory Visit (INDEPENDENT_AMBULATORY_CARE_PROVIDER_SITE_OTHER): Payer: Medicare Other | Admitting: *Deleted

## 2023-07-23 ENCOUNTER — Other Ambulatory Visit: Payer: Self-pay | Admitting: Internal Medicine

## 2023-07-23 DIAGNOSIS — Z7901 Long term (current) use of anticoagulants: Secondary | ICD-10-CM

## 2023-07-23 DIAGNOSIS — Z952 Presence of prosthetic heart valve: Secondary | ICD-10-CM | POA: Diagnosis not present

## 2023-07-23 DIAGNOSIS — K219 Gastro-esophageal reflux disease without esophagitis: Secondary | ICD-10-CM

## 2023-07-23 DIAGNOSIS — I480 Paroxysmal atrial fibrillation: Secondary | ICD-10-CM | POA: Diagnosis not present

## 2023-07-23 LAB — POCT INR: INR: 5.9 — AB (ref 2.0–3.0)

## 2023-07-23 MED ORDER — PANTOPRAZOLE SODIUM 40 MG PO TBEC: DELAYED_RELEASE_TABLET | ORAL | 3 refills | Status: AC

## 2023-07-28 ENCOUNTER — Other Ambulatory Visit: Payer: Self-pay | Admitting: Internal Medicine

## 2023-07-30 ENCOUNTER — Ambulatory Visit (INDEPENDENT_AMBULATORY_CARE_PROVIDER_SITE_OTHER): Payer: Medicare Other | Admitting: *Deleted

## 2023-07-30 ENCOUNTER — Telehealth: Payer: Self-pay | Admitting: *Deleted

## 2023-07-30 DIAGNOSIS — Z952 Presence of prosthetic heart valve: Secondary | ICD-10-CM

## 2023-07-30 DIAGNOSIS — Z7901 Long term (current) use of anticoagulants: Secondary | ICD-10-CM | POA: Diagnosis not present

## 2023-07-30 LAB — POCT INR: INR: 1.5 — AB (ref 2.0–3.0)

## 2023-07-30 NOTE — Patient Instructions (Signed)
Description   Spoke with pt and advised to take 1 tablet today and then resume taking warfarin 1/2 tablet daily.  Recheck INR in 1 week (Prefers 15th & last day of month).  Coumadin Clinic [phone]

## 2023-07-30 NOTE — Telephone Encounter (Signed)
Called since INR is due and he states he will do it around lunch. Will await & follow up.

## 2023-08-06 ENCOUNTER — Ambulatory Visit (INDEPENDENT_AMBULATORY_CARE_PROVIDER_SITE_OTHER): Payer: Medicare Other | Admitting: *Deleted

## 2023-08-06 DIAGNOSIS — Z952 Presence of prosthetic heart valve: Secondary | ICD-10-CM | POA: Diagnosis not present

## 2023-08-06 DIAGNOSIS — Z7901 Long term (current) use of anticoagulants: Secondary | ICD-10-CM

## 2023-08-06 LAB — POCT INR: INR: 3.5 — AB (ref 2.0–3.0)

## 2023-08-06 NOTE — Patient Instructions (Signed)
Description   Spoke with pt and advised to HOLD today's dose and then resume taking warfarin 1/2 tablet daily.  Stay consistent with greens each week (1-2 salads)  Recheck INR in 1 week (Prefers 15th & last day of month).  Coumadin Clinic [phone]

## 2023-08-13 ENCOUNTER — Ambulatory Visit (INDEPENDENT_AMBULATORY_CARE_PROVIDER_SITE_OTHER): Payer: Medicare Other

## 2023-08-13 DIAGNOSIS — Z5181 Encounter for therapeutic drug level monitoring: Secondary | ICD-10-CM | POA: Diagnosis not present

## 2023-08-13 LAB — POCT INR: INR: 2 (ref 2.0–3.0)

## 2023-08-13 NOTE — Patient Instructions (Signed)
Description   Continue taking warfarin 1/2 tablet daily.  Stay consistent with greens each week (1-2 salads)  Recheck INR in 2 weeks (Prefers 15th & last day of month).  Coumadin Clinic [phone]

## 2023-08-18 NOTE — Progress Notes (Incomplete)
Future Appointments  Date Time Provider Department  08/19/2023              3 mo ov   9:30 AM [name], MD [facility]  11/30/2023                 wellness 11:00 AM Adela Glimpse, NP [facility]  03/01/2024                  9 mo ov   9:30 AM [name], MD [facility]  06/01/2024                   cpe 10:00 AM [name], MD [facility]      History of Present Illness:      This very nice 82 y.o.  WWM  ( widowed Oct 2022 )  presents for 3 month follow up with HTN, ASHD, St Jude AoVR, cAfib, HLD, T2_NIDDM  and Vitamin D Deficiency.  CTA in 2021 showed Aortic Atherosclerosis.        Patient is treated for HTN  (1994) & BP has been controlled at home. Today's BP is at goal -                  . In 2004, patient had a CABG &  St Jude AoVR and has been on Coumadin since.  In 2016 , he had a Negative Myoview.  Patient has had no complaints of any cardiac type chest pain, palpitations, dyspnea Billy Reilly /PND, dizziness, claudication, or dependent edema.       Hyperlipidemia is controlled with diet & Rosuvastatin.  Patient denies myalgias or other med SE's. 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   Patient relates that his glucoses were running in the 200's & 300's, so he started taking his deceased wife's insulin November 19, 2023 70/30 ) at doses of 40 u qam  & 25 u qpm ( He previously was administering his wife's insulin & monitoring her glucoses ) .  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      He has had no symptoms of reactive hypoglycemia, diabetic polys, paresthesias or visual blurring.  Last A1c was not at goal :   Lab Results  Component Value Date   HGBA1C 7.8 (H) 06/19/2022   Wt Readings from Last 3 Encounters:   06/15/23 201 lb 6.4 oz (91.4 kg)  05/26/23 197 lb 9.6 oz (89.6 kg)  05/22/23 191 lb 5.8 oz (86.8 kg)                                                            Further, the patient also has history of Vitamin D Deficiency ("28" /2012) and supplements vitamin D . Last vitamin D was at goal :   Lab Results  Component Value Date   VD25OH 78 06/19/2022     Current Outpatient Medications on File Prior to Visit  Medication Sig  . aspirin 81 MG tablet  Take daily.  . Vitamin 5,000 units   daily.  Marland Kitchen CINNAMON  500 mg  Take in the morning and at bedtime.   . clotrimazole-betamethasone  cream Apply to rash 2 to 3 x /day  . VITAMIN B-12 1000 MCG/ML injec INJECT 1/2 ML EVERY MONTH  . Vitamin B-12 SL Place under the tongue daily.  . enalapril 20 MG tablet Take 2 tablets  daily.  . famotidine  20 MG tablet TAKE 1 TABLET  TWICE A DAY WITH MEALS   . glipiZIDE 10 MG TAKE 1 TABLET  3 TIMES A DAY   . metFORMIN -XR 500 MG  TAKE 2 TABLETS TWICE A DAY WITH MEALS   . metoprolol succinate -XL) 50 MG  TAKE 1.5 TABLETS (75 MG TOTAL) DAILY.   . rosuvastatin 10 MG tablet TAKE 1 TABLET EVERY DAY  . Semaglutide (RYBELSUS) 7 MG  Take 7 mg daily.  Marland Kitchen warfarin (COUMADIN) 5 MG  TAKE 1/2 TO 1 TABLET DAILY OR AS DIRECTED     No Known Allergies   PMHx:   Past Medical History:  Diagnosis Date  . Anemia   . AS (aortic stenosis)    valve replacement-st. jude mechanical valve  . ASHD (arteriosclerotic heart disease)   . CAD (coronary artery disease), native coronary artery    CABG 07/18/2003  . Cancer [facility])    prostate  . GERD (gastroesophageal reflux disease)   . History of nuclear stress test 02/12/10   EF 67%, normal perfusion  . Hyperlipidemia   . Hypertension   . Morbid obesity (HCC)   . Type II  diabetes mellitus   . Umbilical hernia   . Vitamin B12 deficiency   . Vitamin D deficiency      Immunization History  Administered Date(s) Administered  . Fluad Quad(high Dose ) 10/14/2020,  09/07/2021  . Influenza Whole 09/01/2012  . Influenza, High Dose  09/27/2015, 10/14/2020  . Influenza 08/25/2016, 08/24/2018, 09/13/2019  . PFIZER Tri-Sucrose Vacc 12/18/2020  . PFIZER-SARS-COV-2 Vacc 03/18/2020, 04/13/2020  . Pneumococcal - 13 12/18/2015  . Pneumococcal - 23 09/01/2012  . Tdap 11/25/2007     Past Surgical History:  Procedure Laterality Date  . AORTIC VALVE REPLACEMENT  07/18/2003   22mm ATS-AP valve  . AORTIC VALVE REPLACEMENT (AVR)/CORONARY ARTERY BYPASS GRAFTING (CABG)  07/18/2003   AVR; bypass x 2-SVG to first diag, SVG to posterior descending  . CARDIAC CATHETERIZATION  07/13/2003   AS-aortic valve gradient , valve area 1.1; obstruction of ostium of posterior lateral branch with obstruction in the ostium of first diagnonal  . HERNIA REPAIR  09/11/2011   RIH, umbilical  . NM MYOCAR PERF WALL MOTION  02/12/2010   protocol:Bruce, EF 67%, exercise cap 7 METS, low risk scan  . TEE WITHOUT CARDIOVERSION  07/18/2003   no evidence of PE, Aortic valve heavily calcified,   . TRANSTHORACIC ECHOCARDIOGRAM  02/21/2013   EF60-65%, Septal motion showed paradox. atrium moderately dilated    FHx:    Reviewed / unchanged  SHx:    Reviewed / unchanged   Systems Review:  Constitutional: Denies fever, chills, wt changes, headaches, insomnia, fatigue, night sweats, change in appetite. Eyes: Denies redness, blurred vision, diplopia, discharge, itchy, watery eyes.  ENT: Denies discharge, congestion, post nasal drip, epistaxis, sore throat, earache, hearing loss, dental pain, tinnitus, vertigo, sinus pain, snoring.  CV: Denies chest pain, palpitations, irregular heartbeat, syncope, dyspnea, diaphoresis, orthopnea, PND, claudication or edema. Respiratory: denies cough, dyspnea, DOE, pleurisy, hoarseness, laryngitis, wheezing.  Gastrointestinal:  Denies dysphagia, odynophagia, heartburn, reflux, water brash, abdominal pain or cramps, nausea, vomiting, bloating, diarrhea,  constipation, hematemesis, melena, hematochezia  or hemorrhoids. Genitourinary: Denies dysuria, frequency, urgency, nocturia, hesitancy, discharge, hematuria or flank pain. Musculoskeletal: Denies arthralgias, myalgias, stiffness, jt. swelling, pain, limping or strain/sprain.  Skin: Denies pruritus, rash, hives, warts, acne, eczema or change in skin lesion(s). Neuro: No weakness, tremor, incoordination, spasms, paresthesia or pain. Psychiatric: Denies confusion, memory loss or sensory loss. Endo: Denies change in weight, skin or hair change.  Heme/Lymph: No excessive bleeding, bruising or enlarged lymph nodes.  Physical Exam  There were no vitals taken for this visit.  Appears  overnourished, well groomed  and in no distress.  Eyes: PERRLA, EOMs, conjunctiva no swelling or erythema. Sinuses: No frontal/maxillary tenderness ENT/Mouth: EAC's clear, TM's nl w/o erythema, bulging. Nares clear w/o erythema, swelling, exudates. Oropharynx clear without erythema or exudates. Oral hygiene is good. Tongue normal, non obstructing. Hearing intact.  Neck: Supple. Thyroid not palpable. Car 2+/2+ without bruits, nodes or JVD. Chest: Respirations nl with BS clear & equal w/o rales, rhonchi, wheezing or stridor.  Cor: prosthetic valve clicks   w/regular rate and rhythm without sig. murmurs, gallops, clicks or rubs. Peripheral pulses normal and equal  without edema.  Abdomen: Soft & bowel sounds normal. Non-tender w/o guarding, rebound, hernias, masses or organomegaly.  Lymphatics: Unremarkable.  Musculoskeletal: Full ROM all peripheral extremities, joint stability, 5/5 strength and normal gait.  Skin: Warm, dry without exposed rashes, lesions or ecchymosis apparent.  Neuro: Cranial nerves intact, reflexes equal bilaterally. Sensory-motor testing grossly intact. Tendon reflexes grossly intact.  Pysch: Alert & oriented x 3.  Insight and judgement nl & appropriate. No ideations.  Assessment and Plan:  1.  Essential hypertension  - Continue medication, monitor blood pressure at home.  - Continue DASH diet.  Reminder to go to the ER if any CP,  SOB, nausea, dizziness, severe HA, changes vision/speech.   - CBC with Differential/Platelet - COMPLETE METABOLIC PANEL WITH GFR - Magnesium - TSH  2. Hyperlipidemia associated with type 2 diabetes mellitus (HCC)  - Continue diet/meds, exercise,& lifestyle modifications.  - Continue monitor periodic cholesterol/liver & renal functions    - Lipid panel - TSH  3. Type 2 diabetes mellitus with stage 1 chronic kidney  disease, with long-term current use of insulin (HCC)  - Continue diet, exercise  - Lifestyle modifications.  - Monitor appropriate labs   - Hemoglobin A1c - Insulin, random  4. Vitamin D deficiency  - Continue supplementation   - VITAMIN D 25 Hydroxy   5. 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 Chronic atrial fibrillation (HCC)  - TSH  7. H/O mechanical aortic valve replacement   8. Medication management  - CBC with Differential/Platelet - COMPLETE METABOLIC PANEL WITH GFR - Magnesium - Lipid panel - TSH - Hemoglobin A1c - Insulin, random - VITAMIN D 25 Hydroxy           Discussed  regular exercise, BP monitoring, weight control to achieve/maintain BMI less than 25 and discussed med and SE's. Recommended labs to assess /monitor clinical status .  I discussed the assessment and treatment plan with the patient. The patient was provided an opportunity to ask questions and all were answered. The patient agreed with the plan and demonstrated an understanding of the instructions.  I provided over 30 minutes of exam, counseling, chart review and  complex critical decision making.  The patient was advised to call back or seek an in-person evaluation if the symptoms worsen or if the condition fails to improve as anticipated.   Billy Maw, MD

## 2023-08-18 NOTE — Progress Notes (Signed)
Future Appointments  Date Time Provider Department  08/19/2023              3 mo ov   9:30 AM [name], MD [facility]  11/30/2023                 wellness 11:00 AM Adela Glimpse, NP [facility]  03/01/2024                  9 mo ov   9:30 AM [name], MD [facility]  06/01/2024                   cpe 10:00 AM [name], MD [facility]      History of Present Illness:      This very nice 82 y.o.  WWM  ( widowed Oct 2022 )  presents for 3 month follow up with HTN, ASHD, St Jude AoVR, cAfib, HLD, T2_NIDDM  and Vitamin D Deficiency.  CTA in 2021 showed Aortic Atherosclerosis.        Patient is treated for HTN  (1994) & BP has been controlled at home. Today's BP is at goal -  130/70. In 2004, patient had a CABG &  St Jude AoVR and has been on Coumadin since.  In 2016 , he had a Negative Myoview.  Patient has had no complaints of any cardiac type chest pain, palpitations, dyspnea Pollyann Kennedy /PND, dizziness, claudication, or dependent edema.       Hyperlipidemia is controlled with diet & Rosuvastatin.  Patient denies myalgias or other med SE's. 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  Last A1c wa 12.7%. Patient had tried [facility] , but stopped , but desires to try [place] that his sister takes. He reports checking glucoses only 2 to 3 x /wek.  Dennison Nancy has Oatmeal for [employer]  and gets a lunch at [employer] . Usu only ha a "jello cup" for evening meal . 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      He has had no symptoms of reactive hypoglycemia, diabetic polys, paresthesias or visual blurring.  Last A1c was not at goal :   Lab Results  Component Value Date   HGBA1C 12.7 (H) 05/12/2023   Wt Readings from Last 3  Encounters:  08/19/23 200 lb (90.7 kg)  06/15/23 201 lb 6.4 oz (91.4 kg)  05/26/23 197 lb 9.6 oz (89.6 kg)                                                           Further, the patient also has history of Vitamin D Deficiency ("28" /2012) and supplements vitamin D . Last vitamin D was at goal :   Lab Results  Component Value Date   VD25OH 79 05/12/2023       Current Outpatient Medications  Medication Instructions   BERBERINE    Off   Cholecalciferol    5,000 Units Daily   CINNAMON 500 mg  2  times daily - sporadically   LOTRISONE cream Apply to rash 2 to 3 x /day   B-12 SL Sublingual Daily   glipiZIDE 10 MG tablet Take  1 tablet   2 x /day  with Meals     metFORMIN -XR  500 mg 1 - 2  times daily with meals   metoprolol succinate -XL  50 mg Daily   pantoprazole 40 MG tablet Take  1 tablet   Daily    rosuvastatin  10 MG tablet TAKE 1 TABLET  EVERY DAY   warfarin  5 MG tablet Take 1/2 to 1 tablet  daily or as directed    No Known Allergies    PMHx:   Past Medical History:  Diagnosis Date   Anemia    AS (aortic stenosis)    valve replacement-st. jude mechanical valve   ASHD (arteriosclerotic heart disease)    CAD (coronary artery disease), native coronary artery    CABG 07/18/2003   Cancer (HCC)    prostate   GERD (gastroesophageal reflux disease)    History of nuclear stress test 02/12/10   EF 67%, normal perfusion   Hyperlipidemia    Hypertension    Morbid obesity (HCC)    Type II  diabetes mellitus    Umbilical hernia    Vitamin B12 deficiency    Vitamin D deficiency      Immunization History  Administered Date(s) Administered   Fluad Quad(high Dose ) 10/14/2020, 09/07/2021   Influenza Whole 09/01/2012   Influenza, High Dose  09/27/2015, 10/14/2020   Influenza 08/25/2016, 08/24/2018, 09/13/2019   PFIZER Tri-Sucrose Vacc 12/18/2020   PFIZER-SARS-COV-2 Vacc 03/18/2020, 04/13/2020   Pneumococcal - 13 12/18/2015   Pneumococcal - 23 09/01/2012   Tdap  11/25/2007     Past Surgical History:  Procedure Laterality Date   AORTIC VALVE REPLACEMENT  07/18/2003   22mm ATS-AP valve   AORTIC VALVE REPLACEMENT (AVR)/CORONARY ARTERY BYPASS GRAFTING (CABG)  07/18/2003   AVR; bypass x 2-SVG to first diag, SVG to posterior descending   CARDIAC CATHETERIZATION  07/13/2003   AS-aortic valve gradient , valve area 1.1; obstruction of ostium of posterior lateral branch with obstruction in the ostium of first diagnonal   HERNIA REPAIR  09/11/2011   RIH, umbilical   NM MYOCAR PERF WALL MOTION  02/12/2010   protocol:Bruce, EF 67%, exercise cap 7 METS, low risk scan   TEE WITHOUT CARDIOVERSION  07/18/2003   no evidence of PE, Aortic valve heavily calcified,    TRANSTHORACIC ECHOCARDIOGRAM  02/21/2013   EF60-65%, Septal motion showed paradox. atrium moderately dilated    FHx:    Reviewed / unchanged  SHx:    Reviewed / unchanged   Systems Review:  Constitutional: Denies fever, chills, wt changes, headaches, insomnia, fatigue, night sweats, change in appetite. Eyes: Denies redness, blurred vision, diplopia, discharge, itchy, watery eyes.  ENT: Denies discharge, congestion, post nasal drip, epistaxis, sore throat, earache, hearing loss, dental pain, tinnitus, vertigo, sinus pain, snoring.  CV: Denies chest pain, palpitations, irregular heartbeat, syncope, dyspnea, diaphoresis, orthopnea, PND, claudication or edema. Respiratory: denies cough, dyspnea, DOE, pleurisy, hoarseness, laryngitis, wheezing.  Gastrointestinal: Denies dysphagia, odynophagia, heartburn, reflux, water brash, abdominal pain or cramps, nausea, vomiting, bloating, diarrhea, constipation, hematemesis, melena, hematochezia  or hemorrhoids. Genitourinary: Denies dysuria, frequency, urgency, nocturia, hesitancy, discharge, hematuria or flank pain. Musculoskeletal: Denies arthralgias, myalgias, stiffness, jt. swelling, pain, limping or strain/sprain.  Skin: Denies pruritus, rash, hives,  warts, acne, eczema or change in skin lesion(s). Neuro: No  weakness, tremor, incoordination, spasms, paresthesia or pain. Psychiatric: Denies confusion, memory loss or sensory loss. Endo: Denies change in weight, skin or hair change.  Heme/Lymph: No excessive bleeding, bruising or enlarged lymph nodes.  Physical Exam  BP 130/70   Pulse 72   Temp 97.9 F (36.6 C)   Resp 16   Ht 5\' 8"  (1.727 m)   Wt 200 lb (90.7 kg)   SpO2 99%   BMI 30.41 kg/m   Appears  overnourished, well groomed  and in no distress.  Eyes: PERRLA, EOMs, conjunctiva no swelling or erythema. Sinuses: No frontal/maxillary tenderness ENT/Mouth: EAC's clear, TM's nl w/o erythema, bulging. Nares clear w/o erythema, swelling, exudates. Oropharynx clear without erythema or exudates. Oral hygiene is good. Tongue normal, non obstructing. Hearing intact.  Neck: Supple. Thyroid not palpable. Car 2+/2+ without bruits, nodes or JVD. Chest: Respirations nl with BS clear & equal w/o rales, rhonchi, wheezing or stridor.  Cor: prosthetic valve clicks   w/regular rate and rhythm without sig. murmurs, gallops, clicks or rubs. Peripheral pulses normal and equal  without edema.  Abdomen: Soft & bowel sounds normal. Non-tender w/o guarding, rebound, hernias, masses or organomegaly.  Lymphatics: Unremarkable.  Musculoskeletal: Full ROM all peripheral extremities, joint stability, 5/5 strength and normal gait.  Skin: Warm, dry without exposed rashes, lesions or ecchymosis apparent.  Neuro: Cranial nerves intact, reflexes equal bilaterally. Sensory-motor testing grossly intact. Tendon reflexes grossly intact.  Pysch: Alert & oriented x 3.  Insight and judgement nl & appropriate. No ideations.  Assessment and Plan:  1. Essential hypertension  - Continue medication, monitor blood pressure at home.  - Continue DASH diet.  Reminder to go to the ER if any CP,  SOB, nausea, dizziness, severe HA, changes vision/speech.   - CBC with  Differential/Platelet - COMPLETE METABOLIC PANEL WITH GFR - Magnesium - TSH  2. Hyperlipidemia associated with type 2 diabetes mellitus (HCC)  - Continue diet/meds, exercise,& lifestyle modifications.  - Continue monitor periodic cholesterol/liver & renal functions    - Lipid panel - TSH  3. Type 2 diabetes mellitus with stage 1 chronic kidney  disease, with long-term current use of insulin (HCC)  - Continue diet, exercise  - Lifestyle modifications.  - Monitor appropriate labs   - Try Jardiance at patient request   - Hemoglobin A1c - Insulin, random  4. Vitamin D deficiency  - Continue supplementation   - VITAMIN D 25 Hydroxy   5. 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 Chronic atrial fibrillation (HCC)  - TSH  7. H/O mechanical aortic valve replacement   8. Medication management  - CBC with Differential/Platelet - COMPLETE METABOLIC PANEL WITH GFR - Magnesium - Lipid panel - TSH - Hemoglobin A1c - Insulin, random - VITAMIN D 25 Hydroxy           Discussed  regular exercise, BP monitoring, weight control to achieve/maintain BMI less than 25 and discussed med and SE's. Recommended labs to assess /monitor clinical status .  I discussed the assessment and treatment plan with the patient. The patient was provided an opportunity to ask questions and all were answered. The patient agreed with the plan and demonstrated an understanding of the instructions.  I provided over 30 minutes of exam, counseling, chart review and  complex critical decision making.        The patient was advised to call back or seek an in-person evaluation if the symptoms worsen or if the condition fails  to improve as anticipated.   Marinus Maw, MD

## 2023-08-19 ENCOUNTER — Ambulatory Visit (INDEPENDENT_AMBULATORY_CARE_PROVIDER_SITE_OTHER): Payer: Medicare Other | Admitting: Internal Medicine

## 2023-08-19 ENCOUNTER — Encounter: Payer: Self-pay | Admitting: Internal Medicine

## 2023-08-19 VITALS — BP 130/70 | HR 72 | Temp 97.9°F | Resp 16 | Ht 68.0 in | Wt 200.0 lb

## 2023-08-19 DIAGNOSIS — E1169 Type 2 diabetes mellitus with other specified complication: Secondary | ICD-10-CM

## 2023-08-19 DIAGNOSIS — Z952 Presence of prosthetic heart valve: Secondary | ICD-10-CM | POA: Diagnosis not present

## 2023-08-19 DIAGNOSIS — I482 Chronic atrial fibrillation, unspecified: Secondary | ICD-10-CM

## 2023-08-19 DIAGNOSIS — Z23 Encounter for immunization: Secondary | ICD-10-CM

## 2023-08-19 DIAGNOSIS — E559 Vitamin D deficiency, unspecified: Secondary | ICD-10-CM

## 2023-08-19 DIAGNOSIS — N181 Chronic kidney disease, stage 1: Secondary | ICD-10-CM

## 2023-08-19 DIAGNOSIS — E1122 Type 2 diabetes mellitus with diabetic chronic kidney disease: Secondary | ICD-10-CM | POA: Diagnosis not present

## 2023-08-19 DIAGNOSIS — Z794 Long term (current) use of insulin: Secondary | ICD-10-CM | POA: Diagnosis not present

## 2023-08-19 DIAGNOSIS — I10 Essential (primary) hypertension: Secondary | ICD-10-CM

## 2023-08-19 DIAGNOSIS — Z79899 Other long term (current) drug therapy: Secondary | ICD-10-CM | POA: Diagnosis not present

## 2023-08-19 DIAGNOSIS — E785 Hyperlipidemia, unspecified: Secondary | ICD-10-CM | POA: Diagnosis not present

## 2023-08-19 DIAGNOSIS — I700 Atherosclerosis of aorta: Secondary | ICD-10-CM | POA: Diagnosis not present

## 2023-08-19 MED ORDER — EMPAGLIFLOZIN 25 MG PO TABS: ORAL_TABLET | ORAL | 1 refills | Status: DC

## 2023-08-19 NOTE — Patient Instructions (Signed)

## 2023-08-20 ENCOUNTER — Telehealth: Payer: Self-pay

## 2023-08-20 ENCOUNTER — Other Ambulatory Visit: Payer: Self-pay | Admitting: Internal Medicine

## 2023-08-20 LAB — INSULIN, RANDOM: Insulin: 12.8 u[IU]/mL

## 2023-08-20 LAB — MAGNESIUM: Magnesium: 2 mg/dL (ref 1.5–2.5)

## 2023-08-20 LAB — COMPLETE METABOLIC PANEL WITHOUT GFR
AG Ratio: 1.7 (calc) (ref 1.0–2.5)
ALT: 17 U/L (ref 9–46)
AST: 18 U/L (ref 10–35)
Albumin: 4.3 g/dL (ref 3.6–5.1)
Alkaline phosphatase (APISO): 135 U/L (ref 35–144)
BUN: 13 mg/dL (ref 7–25)
CO2: 26 mmol/L (ref 20–32)
Calcium: 9.7 mg/dL (ref 8.6–10.3)
Chloride: 97 mmol/L — ABNORMAL LOW (ref 98–110)
Creat: 0.81 mg/dL (ref 0.70–1.22)
Globulin: 2.6 g/dL (ref 1.9–3.7)
Glucose, Bld: 436 mg/dL — ABNORMAL HIGH (ref 65–99)
Potassium: 5 mmol/L (ref 3.5–5.3)
Sodium: 134 mmol/L — ABNORMAL LOW (ref 135–146)
Total Bilirubin: 0.9 mg/dL (ref 0.2–1.2)
Total Protein: 6.9 g/dL (ref 6.1–8.1)
eGFR: 89 mL/min/{1.73_m2}

## 2023-08-20 LAB — CBC WITH DIFFERENTIAL/PLATELET
Absolute Monocytes: 518 {cells}/uL (ref 200–950)
Basophils Absolute: 58 {cells}/uL (ref 0–200)
Basophils Relative: 0.9 %
Eosinophils Absolute: 58 {cells}/uL (ref 15–500)
Eosinophils Relative: 0.9 %
HCT: 46.4 % (ref 38.5–50.0)
Hemoglobin: 14.2 g/dL (ref 13.2–17.1)
Lymphs Abs: 998 {cells}/uL (ref 850–3900)
MCH: 26.8 pg — ABNORMAL LOW (ref 27.0–33.0)
MCHC: 30.6 g/dL — ABNORMAL LOW (ref 32.0–36.0)
MCV: 87.5 fL (ref 80.0–100.0)
MPV: 12.2 fL (ref 7.5–12.5)
Monocytes Relative: 8.1 %
Neutro Abs: 4768 {cells}/uL (ref 1500–7800)
Neutrophils Relative %: 74.5 %
Platelets: 164 10*3/uL (ref 140–400)
RBC: 5.3 Million/uL (ref 4.20–5.80)
RDW: 14.4 % (ref 11.0–15.0)
Total Lymphocyte: 15.6 %
WBC: 6.4 10*3/uL (ref 3.8–10.8)

## 2023-08-20 LAB — LIPID PANEL
Cholesterol: 180 mg/dL
HDL: 44 mg/dL
LDL Cholesterol (Calc): 110 mg/dL — ABNORMAL HIGH
Non-HDL Cholesterol (Calc): 136 mg/dL — ABNORMAL HIGH
Total CHOL/HDL Ratio: 4.1 (calc)
Triglycerides: 145 mg/dL

## 2023-08-20 LAB — VITAMIN D 25 HYDROXY (VIT D DEFICIENCY, FRACTURES): Vit D, 25-Hydroxy: 75 ng/mL (ref 30–100)

## 2023-08-20 LAB — HEMOGLOBIN A1C
Hgb A1c MFr Bld: 12.5 %{Hb} — ABNORMAL HIGH
Mean Plasma Glucose: 312 mg/dL
eAG (mmol/L): 17.3 mmol/L

## 2023-08-20 LAB — TSH: TSH: 1.45 m[IU]/L (ref 0.40–4.50)

## 2023-08-20 MED ORDER — METFORMIN HCL ER 500 MG PO TB24: ORAL_TABLET | ORAL | 3 refills | Status: AC

## 2023-08-20 NOTE — Progress Notes (Signed)
<>*<>*<>*<>*<>*<>*<>*<>*<>*<>*<>*<>*<>*<>*<>*<>*<>*<>*<>*<>*<>*<>*<>*<>*<> <>*<>*<>*<>*<>*<>*<>*<>*<>*<>*<>*<>*<>*<>*<>*<>*<>*<>*<>*<>*<>*<>*<>*<>*<>  -  Test results slightly outside the reference range are not unusual. If there is anything important, I will review this with you,  otherwise it is considered normal test values.  If you have further questions,  please do not hesitate to contact me at the office or via My Chart.   <>*<>*<>*<>*<>*<>*<>*<>*<>*<>*<>*<>*<>*<>*<>*<>*<>*<>*<>*<>*<>*<>*<>*<>*<> <>*<>*<>*<>*<>*<>*<>*<>*<>*<>*<>*<>*<>*<>*<>*<>*<>*<>*<>*<>*<>*<>*<>*<>*<>  - Glucose = 436 mg% - Dangerously way too high  !              ( Ideal or goal  is  less than 120 mg%)   And A1c = 12.5% - Also Dangerously way too high               ( Ideal or goal is less than 6.0% )   PLEASE take your Metformin 4 tablets / day   And   Your Glipizide 2 x / day   Also recommend that you reStart               your Cinnamon & Berberine to help lower your blood sugars  !  <>*<>*<>*<>*<>*<>*<>*<>*<>*<>*<>*<>*<>*<>*<>*<>*<>*<>*<>*<>*<>*<>*<>*<>*<> <>*<>*<>*<>*<>*<>*<>*<>*<>*<>*<>*<>*<>*<>*<>*<>*<>*<>*<>*<>*<>*<>*<>*<>*<>  -  Vitamin D level - Great - Please continue your Vit D same   <>*<>*<>*<>*<>*<>*<>*<>*<>*<>*<>*<>*<>*<>*<>*<>*<>*<>*<>*<>*<>*<>*<>*<>*<> <>*<>*<>*<>*<>*<>*<>*<>*<>*<>*<>*<>*<>*<>*<>*<>*<>*<>*<>*<>*<>*<>*<>*<>*<>  -  All Else - CBC - Kidneys - Electrolytes - Liver - Magnesium & Thyroid    - all  Normal / OK  <>*<>*<>*<>*<>*<>*<>*<>*<>*<>*<>*<>*<>*<>*<>*<>*<>*<>*<>*<>*<>*<>*<>*<>*<> <>*<>*<>*<>*<>*<>*<>*<>*<>*<>*<>*<>*<>*<>*<>*<>*<>*<>*<>*<>*<>*<>*<>*<>*<>

## 2023-08-20 NOTE — Telephone Encounter (Signed)
The patient was informed that his glucose level was 436 which is very dangerous and that he should take his diabetic medication two pills daily. The patient was also advised that he should regulate his diet and avoid high sugar foods to bring down his glucose and A1c levels. The patient was advised that his uncontrolled diabetes could lead to diabetic ketoacidosis and dialysis in the future. The patient inquired about his cholesterol and he was advised that those numbers were high as well and that a well-controlled diet would help to bring those numbers down as well. The patient voiced clear understanding to this staff member.

## 2023-08-27 ENCOUNTER — Ambulatory Visit (INDEPENDENT_AMBULATORY_CARE_PROVIDER_SITE_OTHER): Payer: Medicare Other

## 2023-08-27 ENCOUNTER — Other Ambulatory Visit: Payer: Self-pay | Admitting: Internal Medicine

## 2023-08-27 DIAGNOSIS — Z952 Presence of prosthetic heart valve: Secondary | ICD-10-CM | POA: Diagnosis not present

## 2023-08-27 DIAGNOSIS — Z5181 Encounter for therapeutic drug level monitoring: Secondary | ICD-10-CM | POA: Diagnosis not present

## 2023-08-27 DIAGNOSIS — I480 Paroxysmal atrial fibrillation: Secondary | ICD-10-CM | POA: Diagnosis not present

## 2023-08-27 DIAGNOSIS — Z7901 Long term (current) use of anticoagulants: Secondary | ICD-10-CM | POA: Diagnosis not present

## 2023-08-27 LAB — POCT INR: INR: 4.4 — AB (ref 2.0–3.0)

## 2023-08-28 NOTE — Patient Instructions (Signed)
Description   Eat greens today and HOLD today's dose Continue taking warfarin 1/2 tablet daily.  Stay consistent with greens each week (1-2 salads)  Recheck INR in 2 weeks (Prefers 15th & last day of month).  [facility] [phone]

## 2023-09-04 DIAGNOSIS — H5213 Myopia, bilateral: Secondary | ICD-10-CM | POA: Diagnosis not present

## 2023-09-04 DIAGNOSIS — Z961 Presence of intraocular lens: Secondary | ICD-10-CM | POA: Diagnosis not present

## 2023-09-04 DIAGNOSIS — E119 Type 2 diabetes mellitus without complications: Secondary | ICD-10-CM | POA: Diagnosis not present

## 2023-09-04 DIAGNOSIS — H52203 Unspecified astigmatism, bilateral: Secondary | ICD-10-CM | POA: Diagnosis not present

## 2023-09-04 LAB — HM DIABETES EYE EXAM

## 2023-09-09 ENCOUNTER — Encounter: Payer: Self-pay | Admitting: Internal Medicine

## 2023-09-10 ENCOUNTER — Ambulatory Visit (INDEPENDENT_AMBULATORY_CARE_PROVIDER_SITE_OTHER): Payer: Medicare Other | Admitting: Cardiology

## 2023-09-10 ENCOUNTER — Telehealth: Payer: Self-pay | Admitting: *Deleted

## 2023-09-10 DIAGNOSIS — Z5181 Encounter for therapeutic drug level monitoring: Secondary | ICD-10-CM

## 2023-09-10 LAB — POCT INR: INR: 4.8 — AB (ref 2.0–3.0)

## 2023-09-10 MED ORDER — WARFARIN SODIUM 2.5 MG PO TABS: ORAL_TABLET | ORAL | 0 refills | Status: DC

## 2023-09-10 NOTE — Patient Instructions (Signed)
Description   Called and spoke to pt and instructed him to STOP USING WARFARIN 5 mg TABS and START USING WARFARIN 2.5 mg TABS.  Instructed pt to Hold warfarin today and tomorrow. Then START using the warfarin 2.5 mg tabs taking 1 tablet daily except for 1/2 a tablet on Thursdays. Stay consistent with greens each week (1-2 salads)  Recheck INR in 2 weeks (Prefers 15th & last day of month).  [facility] [phone]

## 2023-09-10 NOTE — Telephone Encounter (Signed)
Called pt since INR due. He states he went out early to eat at [facility] and will get it done in the next hour. Will follow up.

## 2023-09-17 ENCOUNTER — Ambulatory Visit (INDEPENDENT_AMBULATORY_CARE_PROVIDER_SITE_OTHER): Payer: Medicare Other

## 2023-09-17 DIAGNOSIS — Z5181 Encounter for therapeutic drug level monitoring: Secondary | ICD-10-CM

## 2023-09-17 LAB — POCT INR: INR: 1.7 — AB (ref 2.0–3.0)

## 2023-09-17 NOTE — Patient Instructions (Signed)
Description   Called and spoke to pt and instructed him to STOP USING WARFARIN 5 mg TABS and ONLY USE WARFARIN 2.5 mg TABS.  Instructed pt to take 1 tablet (2.5mg ) today and then START using the warfarin 2.5 mg tabs taking 1 tablet daily except for 1/2 a tablet on Sundays and Thursdays.  Stay consistent with greens each week (1-2 salads)  Recheck INR in 2 weeks (Prefers 15th & last day of month).  [facility] [phone]

## 2023-10-01 ENCOUNTER — Ambulatory Visit (INDEPENDENT_AMBULATORY_CARE_PROVIDER_SITE_OTHER): Payer: Medicare Other

## 2023-10-01 DIAGNOSIS — Z5181 Encounter for therapeutic drug level monitoring: Secondary | ICD-10-CM | POA: Diagnosis not present

## 2023-10-01 LAB — POCT INR: INR: 3.4 — AB (ref 2.0–3.0)

## 2023-10-01 NOTE — Patient Instructions (Signed)
Description   Called and spoke to pt and instructed him to STOP USING WARFARIN 5 mg TABS and ONLY USE WARFARIN 2.5 mg TABS.  Instructed pt to HOLD today's dose and then continue taking 1 tablet daily except for 1/2 a tablet on Sundays and Thursdays.  Stay consistent with greens each week (2 salads)  Recheck INR in 2 weeks (Prefers 15th & last day of month).  [facility] [phone]

## 2023-10-04 ENCOUNTER — Other Ambulatory Visit: Payer: Self-pay | Admitting: Cardiology

## 2023-10-15 ENCOUNTER — Ambulatory Visit (INDEPENDENT_AMBULATORY_CARE_PROVIDER_SITE_OTHER): Payer: Medicare Other

## 2023-10-15 DIAGNOSIS — I480 Paroxysmal atrial fibrillation: Secondary | ICD-10-CM | POA: Diagnosis not present

## 2023-10-15 DIAGNOSIS — Z952 Presence of prosthetic heart valve: Secondary | ICD-10-CM

## 2023-10-15 DIAGNOSIS — Z7901 Long term (current) use of anticoagulants: Secondary | ICD-10-CM | POA: Diagnosis not present

## 2023-10-15 LAB — POCT INR: INR: 2.1 (ref 2.0–3.0)

## 2023-10-15 NOTE — Patient Instructions (Signed)
Description   Called and spoke to pt and instructed him to STOP USING WARFARIN 5 mg TABS and ONLY USE WARFARIN 2.5 mg TABS.  Instructed pt to continue taking 1 tablet daily except for 1/2 a tablet on Sundays and Thursdays.  Stay consistent with greens each week (2 salads)  Recheck INR in 2 weeks (Prefers 15th & last day of month).  [facility] [phone]

## 2023-10-17 ENCOUNTER — Other Ambulatory Visit: Payer: Self-pay | Admitting: Internal Medicine

## 2023-10-27 ENCOUNTER — Other Ambulatory Visit: Payer: Self-pay | Admitting: Internal Medicine

## 2023-10-29 ENCOUNTER — Ambulatory Visit (INDEPENDENT_AMBULATORY_CARE_PROVIDER_SITE_OTHER): Payer: Medicare Other | Admitting: Internal Medicine

## 2023-10-29 DIAGNOSIS — Z7901 Long term (current) use of anticoagulants: Secondary | ICD-10-CM | POA: Diagnosis not present

## 2023-10-29 DIAGNOSIS — Z952 Presence of prosthetic heart valve: Secondary | ICD-10-CM | POA: Diagnosis not present

## 2023-10-29 LAB — POCT INR: INR: 3.1 — AB (ref 2.0–3.0)

## 2023-10-29 NOTE — Patient Instructions (Signed)
Description   ONLY USE WARFARIN 2.5 mg TABS Called and spoke to pt and instructed him since he missed last dose of warfarin to continue taking 1 tablet daily except for 1/2 tablet on Sundays and Thursdays. Stay consistent with greens each week (2 salads)  Recheck INR in 2 weeks (Prefers 15th & last day of month).  [facility] [phone]

## 2023-11-05 ENCOUNTER — Ambulatory Visit (INDEPENDENT_AMBULATORY_CARE_PROVIDER_SITE_OTHER): Payer: Medicare Other

## 2023-11-05 DIAGNOSIS — Z5181 Encounter for therapeutic drug level monitoring: Secondary | ICD-10-CM | POA: Diagnosis not present

## 2023-11-05 LAB — POCT INR: INR: 2 (ref 2.0–3.0)

## 2023-11-05 NOTE — Patient Instructions (Signed)
Description   ONLY USE WARFARIN 2.5 mg TABS Called and spoke to pt and instructed him to take 1 tablet today and then continue taking 1 tablet daily except for 1/2 tablet on Sundays and Thursdays. Stay consistent with greens each week (2 salads)  Recheck INR in 2 weeks (Prefers 15th & last day of month).  [facility] [phone]

## 2023-11-20 ENCOUNTER — Ambulatory Visit (INDEPENDENT_AMBULATORY_CARE_PROVIDER_SITE_OTHER): Payer: Medicare Other

## 2023-11-20 DIAGNOSIS — Z5181 Encounter for therapeutic drug level monitoring: Secondary | ICD-10-CM

## 2023-11-20 LAB — POCT INR: INR: 2 (ref 2.0–3.0)

## 2023-11-20 NOTE — Patient Instructions (Signed)
Description   ONLY USE WARFARIN 2.5 mg TABS Called and spoke to pt and instructed him to take 1.5 tablets today and then continue taking 1 tablet daily except for 1/2 tablet on Sundays and Thursdays. Stay consistent with greens each week (2 salads)  Recheck INR in 2 weeks (Prefers 15th & last day of month).  [facility] [phone]

## 2023-11-30 ENCOUNTER — Ambulatory Visit (INDEPENDENT_AMBULATORY_CARE_PROVIDER_SITE_OTHER): Payer: Medicare Other | Admitting: Nurse Practitioner

## 2023-11-30 ENCOUNTER — Encounter: Payer: Self-pay | Admitting: Nurse Practitioner

## 2023-11-30 VITALS — BP 140/80 | HR 78 | Temp 97.6°F | Ht 68.0 in | Wt 205.2 lb

## 2023-11-30 DIAGNOSIS — Z0001 Encounter for general adult medical examination with abnormal findings: Secondary | ICD-10-CM | POA: Diagnosis not present

## 2023-11-30 DIAGNOSIS — I7121 Aneurysm of the ascending aorta, without rupture: Secondary | ICD-10-CM | POA: Diagnosis not present

## 2023-11-30 DIAGNOSIS — E785 Hyperlipidemia, unspecified: Secondary | ICD-10-CM | POA: Diagnosis not present

## 2023-11-30 DIAGNOSIS — E538 Deficiency of other specified B group vitamins: Secondary | ICD-10-CM | POA: Diagnosis not present

## 2023-11-30 DIAGNOSIS — Z7901 Long term (current) use of anticoagulants: Secondary | ICD-10-CM | POA: Diagnosis not present

## 2023-11-30 DIAGNOSIS — E1169 Type 2 diabetes mellitus with other specified complication: Secondary | ICD-10-CM

## 2023-11-30 DIAGNOSIS — K429 Umbilical hernia without obstruction or gangrene: Secondary | ICD-10-CM | POA: Diagnosis not present

## 2023-11-30 DIAGNOSIS — Z952 Presence of prosthetic heart valve: Secondary | ICD-10-CM | POA: Diagnosis not present

## 2023-11-30 DIAGNOSIS — Z0000 Encounter for general adult medical examination without abnormal findings: Secondary | ICD-10-CM

## 2023-11-30 DIAGNOSIS — D649 Anemia, unspecified: Secondary | ICD-10-CM | POA: Diagnosis not present

## 2023-11-30 DIAGNOSIS — I10 Essential (primary) hypertension: Secondary | ICD-10-CM

## 2023-11-30 DIAGNOSIS — Z951 Presence of aortocoronary bypass graft: Secondary | ICD-10-CM | POA: Diagnosis not present

## 2023-11-30 DIAGNOSIS — N182 Chronic kidney disease, stage 2 (mild): Secondary | ICD-10-CM

## 2023-11-30 DIAGNOSIS — I2510 Atherosclerotic heart disease of native coronary artery without angina pectoris: Secondary | ICD-10-CM | POA: Diagnosis not present

## 2023-11-30 DIAGNOSIS — R6889 Other general symptoms and signs: Secondary | ICD-10-CM

## 2023-11-30 DIAGNOSIS — I2583 Coronary atherosclerosis due to lipid rich plaque: Secondary | ICD-10-CM

## 2023-11-30 DIAGNOSIS — D6869 Other thrombophilia: Secondary | ICD-10-CM

## 2023-11-30 DIAGNOSIS — E559 Vitamin D deficiency, unspecified: Secondary | ICD-10-CM

## 2023-11-30 DIAGNOSIS — I700 Atherosclerosis of aorta: Secondary | ICD-10-CM | POA: Diagnosis not present

## 2023-11-30 DIAGNOSIS — Z79899 Other long term (current) drug therapy: Secondary | ICD-10-CM

## 2023-11-30 DIAGNOSIS — E1122 Type 2 diabetes mellitus with diabetic chronic kidney disease: Secondary | ICD-10-CM | POA: Diagnosis not present

## 2023-11-30 DIAGNOSIS — E66811 Obesity, class 1: Secondary | ICD-10-CM

## 2023-11-30 DIAGNOSIS — I4811 Longstanding persistent atrial fibrillation: Secondary | ICD-10-CM

## 2023-11-30 NOTE — Progress Notes (Signed)
 MEDICARE ANNUAL WELLNESS VISIT AND FOLLOW UP Assessment:   Diagnoses and all orders for this visit:  Encounter for Medicare annual wellness exam Due annually  Health maintenance reviewed Healthily lifestyle goals set  Atrial fib/flutter (HCC) Chronic, on coumadin  for valve, following with cardiology  Rate controlled today, monitoring only for now, wasn't reocmmended CV  Ascending aortic aneurysm (HCC) Incidentally noted on ECHO, cardiology is monitoring with annual CTA Last CTA 06/2020 Control BP   Atherosclerosis of aorta (HCC) Per CT 06/2020 Control blood pressure, cholesterol, glucose, increase exercise.   Coronary artery disease due to lipid rich plaque Control blood pressure, cholesterol, glucose, increase exercise.  Followed by cardiology  Essential hypertension Discussed DASH (Dietary Approaches to Stop Hypertension) DASH diet is lower in sodium than a typical American diet. Cut back on foods that are high in saturated fat, cholesterol, and trans fats. Eat more whole-grain foods, fish, poultry, and nuts Remain active and exercise as tolerated daily.  Monitor BP at home-Call if greater than 130/80.  Check CMP/CBC  H/O mechanical aortic valve replacement Continue coumadin ; followed by cardiology  Hx of CABG Control blood pressure, cholesterol, glucose, increase exercise.  Followed by cardiolgy  Acquired thrombophilia (HCC)/ Long term current use of anticoagulant therapy Managed by cardiology Discussed if patient falls to immediately contact office or go to ER. Discussed foods that can increase or decrease Coumadin  levels.  Patient understands to call the office before starting a new medication.  Type II diabetes mellitus with nephropathy (HCC) Max dose of Jardiance  is 25 mg daily.   Reduce Jardiance  dose from 50 mg daily to 25 mg daily. Discussed compliance with Metformin , 1,000 mg BID - patient was taking x1 500 mg BID Discussed compliance with Glipizide  20 mg  BID - patient was taking x1 10 mg daily. Education: Reviewed 'ABCs' of diabetes management  Discussed goals to be met and/or maintained include A1C (<7) - currently not at goal.  Blood pressure (<130/80) Cholesterol (LDL <70) Continue Eye Exam yearly  Continue Dental Exam Q6 mo Discussed dietary recommendations Discussed Physical Activity recommendations Foot exam UTD Check A1C   Mixed hyperlipidemia associated with T2DM (HCC) Discussed lifestyle modifications. Recommended diet heavy in fruits and veggies, omega 3's. Decrease consumption of animal meats, cheeses, and dairy products. Remain active and exercise as tolerated. Continue to monitor. Check lipids/TSH  CKD II associated with T2DM (HCC) Stay well hydrated. Avoid high salt foods. Avoid NSAIDS. Keep BP and BG well controlled.   Take medications as prescribed. Remain active and exercise as tolerated daily. Maintain weight.  Continue to monitor. Check CMP/GFR/Microablumin  Obesity (BMI 30.0-34.9) Discussed appropriate BMI Diet modification. Physical activity. Encouraged/praised to build confidence.  Umbilical hernia without obstruction and without gangrene No complications; continue to monitor  Vitamin B12 deficiency Continue supplement; check levels at CPE  Vitamin D  deficiency Continue supplement for goal of 60-100 Monitor Vitamin D  levels  Medication management All medications discussed and reviewed in full. All questions and concerns regarding medications addressed.    Orders Placed This Encounter  Procedures   CBC with Differential/Platelet   COMPLETE METABOLIC PANEL WITH GFR   Lipid panel   Hemoglobin A1c    Notify office for further evaluation and treatment, questions or concerns if any reported s/s fail to improve.   The patient was advised to call back or seek an in-person evaluation if any symptoms worsen or if the condition fails to improve as anticipated.   Further disposition pending  results of labs. Discussed med's effects  and SE's.    I discussed the assessment and treatment plan with the patient. The patient was provided an opportunity to ask questions and all were answered. The patient agreed with the plan and demonstrated an understanding of the instructions.  Discussed med's effects and SE's. Screening labs and tests as requested with regular follow-up as recommended.  I provided 35 minutes of face-to-face time during this encounter including counseling, chart review, and critical decision making was preformed.  Today's Plan of Care is based on a patient-centered health care approach known as shared decision making - the decisions, tests and treatments allow for patient preferences and values to be balanced with clinical evidence.    Future Appointments  Date Time Provider Department Center  03/01/2024  9:30 AM Tonita Fallow, MD [facility] None  06/07/2024 11:00 AM Tonita Fallow, MD [facility] None  11/29/2024 11:30 AM Laurice President, NP [facility] None     Plan:   During the course of the visit the patient was educated and counseled about appropriate screening and preventive services including:   Pneumococcal vaccine  Influenza vaccine Prevnar 13 Td vaccine Screening electrocardiogram Colorectal cancer screening Diabetes screening Glaucoma screening Nutrition counseling    Subjective:  Billy Reilly is a 83 y.o. male with hx of ASCAD/CABG/AoVR who presents for Medicare Annual Wellness Visit and 3 month follow up for HTN, hyperlipidemia, T2DM, and vitamin D  Def.    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   Overall he reports feeling well today.  He has no new concerns at this time.  GERD is well controlled by famotidine  20 mg BID.   BMI is Body mass index is 31.2 kg/m., he has been working on diet and exercise. Weight goal < 200 lb. Wt Readings from Last 3 Encounters:  11/30/23 205 lb 3.2 oz (93.1 kg)  08/19/23 200 lb (90.7 kg)   06/15/23 201 lb 6.4 oz (91.4 kg)   He has hx of CABG & Mech AoVR in 2004; he is on coumadin  and followed by coumadin  clinic. Nuclear Stress test was negative in 2016 by Dr Mona. In early 2021 he was found to be in a. Fib/flutter at cardiology, asymptomatic. Had follow up ECHO 12/27/2019 showed severe/mod atrial enlargement with normal EF. Also incidentally noted ascending aortic aneurysm 4.4 cm, had 6 month CTA 06/2020 that showed 4.0 cm dilation and recommended annual follow up.  10/21/22 saw Dr. Mona- recent CT angiogram of aorta shows stable aneurysm of thoracic aorta measuring 4 cm- to have repeat echo 03/2023 to reassess valve gradients  His blood pressure has been controlled at home (120s/70s), today their BP is BP: (!) 140/80  BP Readings from Last 3 Encounters:  11/30/23 (!) 140/80  08/19/23 130/70  06/15/23 112/68  He does not workout. He denies chest pain, shortness of breath, dizziness.   He is on cholesterol medication (simvastatin  40 mg daily ) and denies myalgias. His cholesterol is not at goal for LDL <70. The cholesterol last visit was:   Lab Results  Component Value Date   CHOL 180 08/19/2023   HDL 44 08/19/2023   LDLCALC 110 (H) 08/19/2023   TRIG 145 08/19/2023   CHOLHDL 4.1 08/19/2023   He has not been working on diet and exercise for T2DM.  Shares that he increased his dose of Jardiance  to  mg (not recommended as max is 25 mg daily) and he is only taking Metformin  500 mg BID along with Glipizide  10 mg daily. He denies foot ulcerations, hypoglycemia ,  increased appetite, nausea, paresthesia of the feet, polydipsia, polyuria, visual disturbances, vomiting and weight loss.  He does not check his blood sugars  Has had hypoglycemia in the past, typically has sweats but none recently.  Last A1C is uncontrolled and last in the office was:  Lab Results  Component Value Date   HGBA1C 12.5 (H) 08/19/2023   He has CKD I/II associated with T2DM monitored at this office:  Lab  Results  Component Value Date   EGFR 89 08/19/2023     Patient is not on Vitamin D  supplement and very low at last check, admits has been forgetting to take, but has been taking 5000 IU 2-3 times a week:    Lab Results  Component Value Date   VD25OH 43 08/19/2023       Medication Review: Current Outpatient Medications on File Prior to Visit  Medication Sig Dispense Refill   BERBERINE CHLORIDE PO Take by mouth.     clotrimazole -betamethasone  (LOTRISONE ) cream Apply to rash 2 to 3 x /day 45 g 3   empagliflozin  (JARDIANCE ) 25 MG TABS tablet Take  1 tablet   every Morning   for Diabetes 90 tablet 1   glipiZIDE  (GLUCOTROL ) 10 MG tablet Take  1 tablet   2 x /day  with Meals  for DiabetesTake  1 tablet   2 x /day  with Meals  for Diabetes 180 tablet 3   glucose blood (ONE TOUCH ULTRA TEST) test strip CHECK BLOOD SUGAR ONCE DAILY 100 each 3   metFORMIN  (GLUCOPHAGE -XR) 500 MG 24 hr tablet Take  2 tablets  2  x / day with meals  for Diabetes                               /                                                                   TAKE                                         BY                                                 MOUTH 360 tablet 3   metoprolol  succinate (TOPROL -XL) 50 MG 24 hr tablet Take 1 tablet (50 mg total) by mouth daily. Take with or immediately following a meal.     pantoprazole  (PROTONIX ) 40 MG tablet Take  1 tablet   Daily  to Prevent Heartburn & Indigestion 90 tablet 3   rosuvastatin  (CRESTOR ) 10 MG tablet TAKE 1 TABLET BY MOUTH EVERY DAY 90 tablet 3   warfarin (COUMADIN ) 2.5 MG tablet TAKE 1/2 A TABLET TO 1 TABLET BY MOUTH DAILY OR AS INSTRUCTED BY THE COUMADIN  CLINIC 90 tablet 1   Cholecalciferol  125 MCG (5000 UT) CHEW Chew 5,000 Units by mouth daily. (Patient not taking: Reported on 11/30/2023)     CINNAMON PO Take 500 mg by  mouth in the morning and at bedtime.  (Patient not taking: Reported on 08/19/2023)     Cyanocobalamin  (B-12 SL) Place under the tongue daily.  (Patient not taking: Reported on 11/30/2023)     fluconazole  (DIFLUCAN ) 100 MG tablet Take  1 tablet  Daily for 2 weeks for Yeast Skin Infection (Patient not taking: Reported on 11/30/2023) 14 tablet 1   No current facility-administered medications on file prior to visit.    Allergies: No Known Allergies  Current Problems (verified) has Long term current use of anticoagulant therapy; H/O mechanical aortic valve replacement; Hyperlipidemia associated with type 2 diabetes mellitus (HCC); Type II diabetes mellitus with nephropathy (HCC); Vitamin D  deficiency; Vitamin B12 deficiency; Essential hypertension; Medication management; Coronary artery disease due to lipid rich plaque; Hx of CABG; Obesity (BMI 30.0-34.9); CKD stage 2 due to type 2 diabetes mellitus (HCC); Atrial fibrillation (HCC); GERD (gastroesophageal reflux disease); Ascending aortic aneurysm; Aortic atherosclerosis (HCC) by CTA 06/2020; Occult GI bleeding; and GI bleed on their problem list.  Screening Tests Immunization History  Administered Date(s) Administered   Fluad Quad(high Dose 65+) 10/14/2020, 09/07/2021   Influenza Whole 09/01/2012   Influenza, High Dose Seasonal PF 09/27/2015, 10/14/2020, 09/23/2022, 08/19/2023   Influenza-Unspecified 08/25/2016, 08/24/2018, 09/13/2019   PFIZER Comirnaty(Gray Top)Covid-19 Tri-Sucrose Vaccine 12/18/2020   PFIZER(Purple Top)SARS-COV-2 Vaccination 03/18/2020, 04/13/2020   Pneumococcal Conjugate-13 12/18/2015   Pneumococcal Polysaccharide-23 09/01/2012   Tdap 11/25/2007   Zoster Recombinant(Shingrix) 02/25/2023, 04/28/2023    Preventative care: Last colonoscopy: 2011  Cologuard: 03/2022 negative   Prior vaccinations: TD or Tdap: 2009 DUE patient declines Influenza: 07/2023  Pneumococcal: 2013 Prevnar13: 2017 Shingles/Zostavax: declines Covid 19: 3/3, pfizer   Names of Other Physician/Practitioners you currently use: 1. [facility] here for  primary care 2. Dr. Robinson, eye doctor, last visit 2021, report requested, reports has upcoming, will see Dr. 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 Dr. Elspeth Fenton, dentist, last visit 2024, goes yearly  Patient Care Team: Tonita Fallow, MD as PCP - General (Internal Medicine) Mona Vinie BROCKS, MD as PCP - Cardiology (Cardiology) Mona Vinie BROCKS, MD as Consulting Physician (Cardiology) Celestia Agent, MD (Inactive) as Consulting Physician (Gastroenterology)  Surgical: He  has a past surgical history that includes Aortic valve replacement (07/18/2003); Hernia repair (09/11/2011); Aortic valve replacement (avr)/coronary artery bypass grafting (cabg) (07/18/2003); Cardiac catheterization (07/13/2003); TEE without cardioversion (07/18/2003); transthoracic echocardiogram (02/21/2013); NM MYOCAR PERF WALL MOTION (02/12/2010); and Esophagogastroduodenoscopy (egd) with propofol  (N/A, 05/22/2023). Family His family history includes Cancer in his brother and sister; Heart attack in his father; Heart disease in his father; Stroke in his mother. Social history  He reports that he has never smoked. He has never used smokeless tobacco. He reports that he does not drink alcohol and does not use drugs.  MEDICARE WELLNESS OBJECTIVES: Physical activity: Current Exercise Habits: The patient does not participate in regular exercise at present Cardiac risk factors:   Depression/mood screen:      11/30/2023   11:35 AM  Depression screen PHQ 2/9  Decreased Interest 0  Down, Depressed, Hopeless 0  PHQ - 2 Score 0    ADLs:     11/30/2023   11:35 AM 08/19/2023   12:22 AM  In your present state of health, do you have any difficulty performing the following activities:  Hearing? 0 0  Vision? 0 0  Difficulty concentrating or making decisions? 0 0  Walking or climbing stairs? 0 0  Dressing or bathing?  0 0  Doing errands, shopping? 0 0     Cognitive Testing  Alert? Yes   Normal Appearance?Yes  Oriented to person? Yes  Place? Yes   Time? Yes  Recall of three objects?  Yes  Can perform simple calculations? Yes  Displays appropriate judgment?Yes  Can read the correct time from a watch face?Yes  EOL planning:     Objective:   Today's Vitals   11/30/23 1057  BP: (!) 140/80  Pulse: 78  Temp: 97.6 F (36.4 C)  SpO2: 98%  Weight: 205 lb 3.2 oz (93.1 kg)  Height: 5' 8 (1.727 m)    Body mass index is 31.2 kg/m.  General appearance: alert, no distress, WD/WN, male HEENT: normocephalic, sclerae anicteric, TMs pearly, nares patent, no discharge or erythema, pharynx normal Oral cavity: MMM, no lesions Neck: supple, no lymphadenopathy, no thyromegaly, no masses Heart: Irregularly irregular, normal S1, S2, no murmurs Lungs: CTA bilaterally, no wheezes, rhonchi, or rales Abdomen: +bs, soft, non tender, non distended, no masses, no hepatomegaly, no splenomegaly Musculoskeletal: nontender, no swelling, no obvious deformity Extremities: no edema, no cyanosis, no clubbing Pulses: 2+ symmetric, upper and lower extremities, normal cap refill Neurological: alert, oriented x 3, CN2-12 intact, strength normal upper extremities and lower extremities, sensation normal throughout, DTRs 2+ throughout, no cerebellar signs, gait normal Psychiatric: normal affect, behavior normal, pleasant   Medicare Attestation I have personally reviewed: The patient's medical and social history Their use of alcohol, tobacco or illicit drugs Their current medications and supplements The patient's functional ability including ADLs,fall risks, home safety risks, cognitive, and hearing and visual impairment Diet and physical activities Evidence for depression or mood disorders  The patient's weight, height, BMI, and visual acuity have been recorded in the chart.  I have made referrals, counseling, and provided education to the patient based on review of the above and I have provided  the patient with a written personalized care plan for preventive services.     [facility], NP   11/30/2023

## 2023-11-30 NOTE — Patient Instructions (Signed)
 Take Jardiance  25 mg daily Take Metformin  1,000 mg twice a day Take Glipizide   10 mg twice a day  Diabetes Mellitus and Foot Care Diabetes, also called diabetes mellitus, may cause problems with your feet and legs because of poor blood flow (circulation). Poor circulation may make your skin: Become thinner and drier. Break more easily. Heal more slowly. Peel and crack. You may also have nerve damage (neuropathy). This can cause decreased feeling in your legs and feet. This means that you may not notice minor injuries to your feet that could lead to more serious problems. Finding and treating problems early is the best way to prevent future foot problems. How to care for your feet Foot hygiene  Wash your feet daily with warm water and mild soap. Do not use hot water. Then, pat your feet and the areas between your toes until they are fully dry. Do not soak your feet. This can dry your skin. Trim your toenails straight across. Do not dig under them or around the cuticle. File the edges of your nails with an emery board or nail file. Apply a moisturizing lotion or petroleum jelly to the skin on your feet and to dry, brittle toenails. Use lotion that does not contain alcohol and is unscented. Do not apply lotion between your toes. Shoes and socks Wear clean socks or stockings every day. Make sure they are not too tight. Do not wear knee-high stockings. These may decrease blood flow to your legs. Wear shoes that fit well and have enough cushioning. Always look in your shoes before you put them on to be sure there are no objects inside. To break in new shoes, wear them for just a few hours a day. This prevents injuries on your feet. Wounds, scrapes, corns, and calluses  Check your feet daily for blisters, cuts, bruises, sores, and redness. If you cannot see the bottom of your feet, use a mirror or ask someone for help. Do not cut off corns or calluses or try to remove them with medicine. If you  find a minor scrape, cut, or break in the skin on your feet, keep it and the skin around it clean and dry. You may clean these areas with mild soap and water. Do not clean the area with peroxide, alcohol, or iodine. If you have a wound, scrape, corn, or callus on your foot, look at it several times a day to make sure it is healing and not infected. Check for: Redness, swelling, or pain. Fluid or blood. Warmth. Pus or a bad smell. General tips Do not cross your legs. This may decrease blood flow to your feet. Do not use heating pads or hot water bottles on your feet. They may burn your skin. If you have lost feeling in your feet or legs, you may not know this is happening until it is too late. Protect your feet from hot and cold by wearing shoes, such as at the beach or on hot pavement. Schedule a complete foot exam at least once a year or more often if you have foot problems. Report any cuts, sores, or bruises to your health care provider right away. Where to find more information American Diabetes Association: diabetes.org Association of Diabetes Care & Education Specialists: diabeteseducator.org Contact a health care provider if: You have a condition that increases your risk of infection, and you have any cuts, sores, or bruises on your feet. You have an injury that is not healing. You have redness on your  legs or feet. You feel burning or tingling in your legs or feet. You have pain or cramps in your legs and feet. Your legs or feet are numb. Your feet always feel cold. You have pain around any toenails. Get help right away if: You have a wound, scrape, corn, or callus on your foot and: You have signs of infection. You have a fever. You have a red line going up your leg. This information is not intended to replace advice given to you by your health care provider. Make sure you discuss any questions you have with your health care provider. Document Revised: 05/14/2022 Document  Reviewed: 05/14/2022 Elsevier Patient Education  2024 [employer].

## 2023-12-03 ENCOUNTER — Other Ambulatory Visit: Payer: Self-pay | Admitting: Nurse Practitioner

## 2023-12-03 ENCOUNTER — Other Ambulatory Visit: Payer: Self-pay

## 2023-12-03 ENCOUNTER — Ambulatory Visit (INDEPENDENT_AMBULATORY_CARE_PROVIDER_SITE_OTHER): Payer: Medicare Other | Admitting: *Deleted

## 2023-12-03 DIAGNOSIS — I480 Paroxysmal atrial fibrillation: Secondary | ICD-10-CM | POA: Diagnosis not present

## 2023-12-03 DIAGNOSIS — D649 Anemia, unspecified: Secondary | ICD-10-CM

## 2023-12-03 DIAGNOSIS — Z952 Presence of prosthetic heart valve: Secondary | ICD-10-CM

## 2023-12-03 DIAGNOSIS — Z7901 Long term (current) use of anticoagulants: Secondary | ICD-10-CM

## 2023-12-03 LAB — POCT INR: INR: 1.3 — AB (ref 2.0–3.0)

## 2023-12-03 NOTE — Patient Instructions (Signed)
 Description   ONLY USE WARFARIN 2.5 mg TABS Called and spoke to pt and instructed him to take 1 tablet today of warfarin today and 1.5 tablets of warfarin tomorrow  then continue taking 1 tablet daily except for 1/2 tablet on Sundays and Thursdays. Stay consistent with greens each week (2 salads)  Recheck INR in 1 week (normally 2 weeks).  (Prefers 15th & last day of month).  [facility] [phone]

## 2023-12-04 LAB — LIPID PANEL
Cholesterol: 173 mg/dL
HDL: 46 mg/dL
LDL Cholesterol (Calc): 107 mg/dL — ABNORMAL HIGH
Non-HDL Cholesterol (Calc): 127 mg/dL
Total CHOL/HDL Ratio: 3.8 (calc)
Triglycerides: 106 mg/dL

## 2023-12-04 LAB — CBC WITH DIFFERENTIAL/PLATELET
Absolute Lymphocytes: 1162 {cells}/uL (ref 850–3900)
Absolute Monocytes: 407 {cells}/uL (ref 200–950)
Basophils Absolute: 59 {cells}/uL (ref 0–200)
Basophils Relative: 1 %
Eosinophils Absolute: 89 {cells}/uL (ref 15–500)
Eosinophils Relative: 1.5 %
HCT: 46.2 % (ref 38.5–50.0)
Hemoglobin: 14 g/dL (ref 13.2–17.1)
MCH: 26 pg — ABNORMAL LOW (ref 27.0–33.0)
MCHC: 30.3 g/dL — ABNORMAL LOW (ref 32.0–36.0)
MCV: 85.7 fL (ref 80.0–100.0)
MPV: 12.3 fL (ref 7.5–12.5)
Monocytes Relative: 6.9 %
Neutro Abs: 4183 {cells}/uL (ref 1500–7800)
Neutrophils Relative %: 70.9 %
Platelets: 171 10*3/uL (ref 140–400)
RBC: 5.39 Million/uL (ref 4.20–5.80)
RDW: 16.7 % — ABNORMAL HIGH (ref 11.0–15.0)
Total Lymphocyte: 19.7 %
WBC: 5.9 10*3/uL (ref 3.8–10.8)

## 2023-12-04 LAB — COMPLETE METABOLIC PANEL WITHOUT GFR
AG Ratio: 1.8 (calc) (ref 1.0–2.5)
ALT: 13 U/L (ref 9–46)
AST: 14 U/L (ref 10–35)
Albumin: 4.4 g/dL (ref 3.6–5.1)
Alkaline phosphatase (APISO): 103 U/L (ref 35–144)
BUN: 10 mg/dL (ref 7–25)
CO2: 26 mmol/L (ref 20–32)
Calcium: 9.4 mg/dL (ref 8.6–10.3)
Chloride: 102 mmol/L (ref 98–110)
Creat: 0.85 mg/dL (ref 0.70–1.22)
Globulin: 2.4 g/dL (ref 1.9–3.7)
Glucose, Bld: 319 mg/dL — ABNORMAL HIGH (ref 65–99)
Potassium: 5.1 mmol/L (ref 3.5–5.3)
Sodium: 139 mmol/L (ref 135–146)
Total Bilirubin: 0.8 mg/dL (ref 0.2–1.2)
Total Protein: 6.8 g/dL (ref 6.1–8.1)
eGFR: 87 mL/min/{1.73_m2}

## 2023-12-04 LAB — TEST AUTHORIZATION

## 2023-12-04 LAB — HEMOGLOBIN A1C
Hgb A1c MFr Bld: 11.7 %{Hb} — ABNORMAL HIGH
Mean Plasma Glucose: 289 mg/dL
eAG (mmol/L): 16 mmol/L

## 2023-12-04 LAB — IRON,TIBC AND FERRITIN PANEL
%SAT: 10 % — ABNORMAL LOW (ref 20–48)
Ferritin: 12 ng/mL — ABNORMAL LOW (ref 24–380)
Iron: 48 ug/dL — ABNORMAL LOW (ref 50–180)
TIBC: 467 ug/dL — ABNORMAL HIGH (ref 250–425)

## 2023-12-06 ENCOUNTER — Other Ambulatory Visit: Payer: Self-pay | Admitting: Nurse Practitioner

## 2023-12-06 MED ORDER — FERROUS SULFATE 325 (65 FE) MG PO TBEC: 325.0000 mg | DELAYED_RELEASE_TABLET | Freq: Every day | ORAL | 3 refills | Status: DC

## 2023-12-07 ENCOUNTER — Other Ambulatory Visit: Payer: Self-pay | Admitting: Internal Medicine

## 2023-12-07 DIAGNOSIS — E1122 Type 2 diabetes mellitus with diabetic chronic kidney disease: Secondary | ICD-10-CM

## 2023-12-10 ENCOUNTER — Ambulatory Visit (INDEPENDENT_AMBULATORY_CARE_PROVIDER_SITE_OTHER): Payer: Medicare Other

## 2023-12-10 DIAGNOSIS — Z5181 Encounter for therapeutic drug level monitoring: Secondary | ICD-10-CM

## 2023-12-10 LAB — POCT INR: INR: 2.7 (ref 2.0–3.0)

## 2023-12-10 NOTE — Patient Instructions (Signed)
Description   ONLY USE WARFARIN 2.5 mg TABS Called and spoke to pt and instructed him to continue taking 1 tablet daily except for 1/2 tablet on Sundays and Thursdays. Stay consistent with greens each week (2 salads)  Recheck INR in 1 week (normally 2 weeks).  (Prefers 15th & last day of month).  [facility] [phone]

## 2023-12-24 ENCOUNTER — Ambulatory Visit (INDEPENDENT_AMBULATORY_CARE_PROVIDER_SITE_OTHER): Payer: Medicare Other | Admitting: *Deleted

## 2023-12-24 DIAGNOSIS — Z7901 Long term (current) use of anticoagulants: Secondary | ICD-10-CM | POA: Diagnosis not present

## 2023-12-24 DIAGNOSIS — Z952 Presence of prosthetic heart valve: Secondary | ICD-10-CM

## 2023-12-24 LAB — POCT INR: INR: 2.7 (ref 2.0–3.0)

## 2023-12-24 NOTE — Patient Instructions (Signed)
Description   ONLY USE WARFARIN 2.5 mg TABS Called and spoke to pt and instructed him to continue taking 1 tablet daily except for 1/2 tablet on Sundays and Thursdays. Stay consistent with greens each week (2 salads)  Recheck INR in 2 weeks).  (Prefers 15th & last day of month).  [facility] [phone]

## 2024-01-07 ENCOUNTER — Ambulatory Visit (INDEPENDENT_AMBULATORY_CARE_PROVIDER_SITE_OTHER): Payer: Self-pay

## 2024-01-07 DIAGNOSIS — Z5181 Encounter for therapeutic drug level monitoring: Secondary | ICD-10-CM

## 2024-01-07 LAB — POCT INR: INR: 3.3 — AB (ref 2.0–3.0)

## 2024-01-07 NOTE — Patient Instructions (Signed)
Description   ONLY USE WARFARIN 2.5 mg TABS Called and spoke to pt and instructed him to HOLD today's dose and then continue taking 1 tablet daily except for 1/2 tablet on Sundays and Thursdays. Stay consistent with greens each week (2 salads)  Recheck INR in 2 weeks).  (Prefers 15th & last day of month).  [facility] [phone]

## 2024-01-21 ENCOUNTER — Ambulatory Visit (INDEPENDENT_AMBULATORY_CARE_PROVIDER_SITE_OTHER): Payer: Medicare Other

## 2024-01-21 DIAGNOSIS — Z5181 Encounter for therapeutic drug level monitoring: Secondary | ICD-10-CM | POA: Diagnosis not present

## 2024-01-21 LAB — POCT INR: INR: 2.5 (ref 2.0–3.0)

## 2024-01-21 NOTE — Patient Instructions (Signed)
 Description   ONLY USE WARFARIN 2.5 mg TABS Called and spoke to pt and instructed him to take 1 tablet today and then continue taking 1 tablet daily except for 1/2 tablet on Sundays and Thursdays. Stay consistent with greens each week (2 salads)  Recheck INR in 2 weeks (Prefers 15th & last day of month).  [facility] [phone]

## 2024-01-28 ENCOUNTER — Encounter: Payer: Self-pay | Admitting: *Deleted

## 2024-02-04 ENCOUNTER — Ambulatory Visit (INDEPENDENT_AMBULATORY_CARE_PROVIDER_SITE_OTHER)

## 2024-02-04 DIAGNOSIS — Z5181 Encounter for therapeutic drug level monitoring: Secondary | ICD-10-CM

## 2024-02-04 LAB — POCT INR: INR: 1.8 — AB (ref 2.0–3.0)

## 2024-02-04 NOTE — Patient Instructions (Signed)
 Description   ONLY USE WARFARIN 2.5 mg TABS Called and spoke to pt and instructed him to take 1 tablet today and then continue taking 1 tablet daily except for 1/2 tablet on Sundays and Thursdays. Stay consistent with greens each week (2 salads)  Recheck INR in 2 weeks (Prefers 15th & last day of month).  [facility] [phone]

## 2024-02-18 ENCOUNTER — Ambulatory Visit (INDEPENDENT_AMBULATORY_CARE_PROVIDER_SITE_OTHER): Admitting: Cardiology

## 2024-02-18 DIAGNOSIS — Z5181 Encounter for therapeutic drug level monitoring: Secondary | ICD-10-CM

## 2024-02-18 LAB — POCT INR: INR: 3.5 — AB (ref 2.0–3.0)

## 2024-02-18 NOTE — Patient Instructions (Signed)
 Description   ONLY USE WARFARIN 2.5 mg TABS Called and spoke to pt and instructed him to HOLD warfarin today Then continue taking 1 tablet daily except for 1/2 tablet on Sundays and Thursdays.  Stay consistent with greens each week (2 salads)  Recheck INR in 2 weeks).  [facility] [phone]

## 2024-03-01 ENCOUNTER — Ambulatory Visit: Payer: Medicare Other | Admitting: Internal Medicine

## 2024-03-03 ENCOUNTER — Ambulatory Visit (INDEPENDENT_AMBULATORY_CARE_PROVIDER_SITE_OTHER)

## 2024-03-03 DIAGNOSIS — Z5181 Encounter for therapeutic drug level monitoring: Secondary | ICD-10-CM | POA: Diagnosis not present

## 2024-03-03 LAB — POCT INR: INR: 2 (ref 2.0–3.0)

## 2024-03-03 NOTE — Patient Instructions (Signed)
 Description   ONLY USE WARFARIN 2.5 mg TABS Called and spoke to pt and instructed him to take 1 tablet today and then continue taking 1 tablet daily except for 1/2 tablet on Sundays and Thursdays.  Stay consistent with greens each week (2 salads)  Recheck INR in 2 weeks).  [facility] [phone]

## 2024-03-17 ENCOUNTER — Ambulatory Visit (INDEPENDENT_AMBULATORY_CARE_PROVIDER_SITE_OTHER): Admitting: *Deleted

## 2024-03-17 ENCOUNTER — Telehealth: Payer: Self-pay | Admitting: *Deleted

## 2024-03-17 DIAGNOSIS — Z952 Presence of prosthetic heart valve: Secondary | ICD-10-CM | POA: Diagnosis not present

## 2024-03-17 DIAGNOSIS — Z7901 Long term (current) use of anticoagulants: Secondary | ICD-10-CM | POA: Diagnosis not present

## 2024-03-17 LAB — POCT INR: INR: 1.8 — AB (ref 2.0–3.0)

## 2024-03-17 NOTE — Telephone Encounter (Signed)
 Called pt to advise that INR is due. There was no answer so left him a message.

## 2024-03-17 NOTE — Patient Instructions (Signed)
 Description   ONLY USE WARFARIN 2.5 mg TABS Called and spoke to pt and instructed him to take 1 tablet of warfarin today and then continue taking 1 tablet daily except for 1/2 tablet on Sundays and Thursdays.  Stay consistent with greens each week (2 salads)  Recheck INR in 2 weeks).  [facility] [phone]

## 2024-03-31 ENCOUNTER — Ambulatory Visit (INDEPENDENT_AMBULATORY_CARE_PROVIDER_SITE_OTHER): Admitting: *Deleted

## 2024-03-31 DIAGNOSIS — Z952 Presence of prosthetic heart valve: Secondary | ICD-10-CM | POA: Diagnosis not present

## 2024-03-31 DIAGNOSIS — Z7901 Long term (current) use of anticoagulants: Secondary | ICD-10-CM

## 2024-03-31 LAB — POCT INR: INR: 2 (ref 2.0–3.0)

## 2024-03-31 NOTE — Patient Instructions (Signed)
 Description   ONLY USE WARFARIN 2.5 mg TABS Called and spoke to pt and instructed him to take 1 tablet of warfarin today and then continue taking 1 tablet daily except for 1/2 tablet on Sundays and Thursdays.  Stay consistent with greens each week (2 salads)  Recheck INR in 2 weeks).  [facility] [phone]

## 2024-04-14 ENCOUNTER — Ambulatory Visit (INDEPENDENT_AMBULATORY_CARE_PROVIDER_SITE_OTHER): Admitting: Pharmacist

## 2024-04-14 DIAGNOSIS — Z7901 Long term (current) use of anticoagulants: Secondary | ICD-10-CM

## 2024-04-14 DIAGNOSIS — Z952 Presence of prosthetic heart valve: Secondary | ICD-10-CM | POA: Diagnosis not present

## 2024-04-14 LAB — POCT INR: INR: 2.5 (ref 2.0–3.0)

## 2024-04-14 NOTE — Patient Instructions (Addendum)
 Description   ONLY USE WARFARIN 2.5 mg TABS Called and spoke to pt and instructed him continue taking 1 tablet daily except for 1/2 tablet on Sundays and Thursdays.  Stay consistent with greens each week (2 salads)  Recheck INR in 2 weeks.  [facility] [phone]

## 2024-04-28 ENCOUNTER — Ambulatory Visit (INDEPENDENT_AMBULATORY_CARE_PROVIDER_SITE_OTHER): Payer: Self-pay

## 2024-04-28 DIAGNOSIS — Z5181 Encounter for therapeutic drug level monitoring: Secondary | ICD-10-CM | POA: Diagnosis not present

## 2024-04-28 LAB — POCT INR: INR: 1.7 — AB (ref 2.0–3.0)

## 2024-04-28 NOTE — Patient Instructions (Signed)
 Description   ONLY USE WARFARIN 2.5 mg TABS Called and spoke to pt and instructed him take 1 tablet today and then continue taking 1 tablet daily except for 1/2 tablet on Sundays and Thursdays.  Stay consistent with greens each week (2 salads)  Recheck INR in 2 weeks.  [facility] [phone]

## 2024-05-12 ENCOUNTER — Ambulatory Visit (INDEPENDENT_AMBULATORY_CARE_PROVIDER_SITE_OTHER)

## 2024-05-12 DIAGNOSIS — Z5181 Encounter for therapeutic drug level monitoring: Secondary | ICD-10-CM

## 2024-05-12 LAB — POCT INR: INR: 1.4 — AB (ref 2.0–3.0)

## 2024-05-12 NOTE — Progress Notes (Signed)
Please see anticoagulation encounter.

## 2024-05-12 NOTE — Patient Instructions (Signed)
 Description   ONLY USE WARFARIN 2.5 mg TABS Called and spoke to pt and instructed him take 1 tablet today and 1.5 tablets tomorrow and then START taking 1 tablet daily except for 1/2 tablet on Thursdays.  Stay consistent with greens each week (2 salads)  Recheck INR in 2 weeks.  [facility] [phone]

## 2024-05-23 ENCOUNTER — Encounter: Payer: Medicare Other | Admitting: Internal Medicine

## 2024-05-26 ENCOUNTER — Ambulatory Visit (INDEPENDENT_AMBULATORY_CARE_PROVIDER_SITE_OTHER): Payer: Self-pay | Admitting: Cardiology

## 2024-05-26 DIAGNOSIS — Z952 Presence of prosthetic heart valve: Secondary | ICD-10-CM | POA: Diagnosis not present

## 2024-05-26 DIAGNOSIS — Z5181 Encounter for therapeutic drug level monitoring: Secondary | ICD-10-CM | POA: Diagnosis not present

## 2024-05-26 LAB — POCT INR: INR: 1.2 — AB (ref 2.0–3.0)

## 2024-05-26 NOTE — Progress Notes (Signed)
Please see anticoagulation encounter.

## 2024-05-26 NOTE — Patient Instructions (Signed)
 Description   ONLY USE WARFARIN 2.5 mg TABS  Called and spoke to pt and instructed him to take 1.5 tablets of warfarin today and tomorrow then START taking warfarin 1 tablet daily. Recheck INR in 1 week.   [facility] [phone]

## 2024-06-01 ENCOUNTER — Encounter: Payer: Medicare Other | Admitting: Internal Medicine

## 2024-06-02 ENCOUNTER — Ambulatory Visit (INDEPENDENT_AMBULATORY_CARE_PROVIDER_SITE_OTHER)

## 2024-06-02 ENCOUNTER — Other Ambulatory Visit: Payer: Self-pay | Admitting: Cardiology

## 2024-06-02 DIAGNOSIS — Z7901 Long term (current) use of anticoagulants: Secondary | ICD-10-CM

## 2024-06-02 DIAGNOSIS — Z5181 Encounter for therapeutic drug level monitoring: Secondary | ICD-10-CM | POA: Diagnosis not present

## 2024-06-02 DIAGNOSIS — Z952 Presence of prosthetic heart valve: Secondary | ICD-10-CM

## 2024-06-02 LAB — POCT INR: INR: 1.7 — AB (ref 2.0–3.0)

## 2024-06-02 NOTE — Progress Notes (Signed)
Please see anticoagulation encounter.

## 2024-06-02 NOTE — Patient Instructions (Signed)
 Description   ONLY USE WARFARIN 2.5 mg TABS Called and spoke to pt and instructed him to take 1.5 tablets of warfarin today and tomorrow then resume taking warfarin 1 tablet daily.  Recheck INR in 1 week.  [facility] [phone]

## 2024-06-07 ENCOUNTER — Encounter: Payer: Medicare Other | Admitting: Internal Medicine

## 2024-06-09 ENCOUNTER — Ambulatory Visit (INDEPENDENT_AMBULATORY_CARE_PROVIDER_SITE_OTHER)

## 2024-06-09 DIAGNOSIS — Z5181 Encounter for therapeutic drug level monitoring: Secondary | ICD-10-CM | POA: Diagnosis not present

## 2024-06-09 LAB — POCT INR: INR: 3.2 — AB (ref 2.0–3.0)

## 2024-06-09 NOTE — Patient Instructions (Signed)
 Description   ONLY USE WARFARIN 2.5 mg TABS Called and spoke to pt and instructed to only take 1/2 tablet tonight and then resume taking warfarin 1 tablet daily.  Recheck INR in 1 week.  [facility] [phone]

## 2024-06-09 NOTE — Progress Notes (Signed)
Please see anticoagulation encounter.

## 2024-06-16 ENCOUNTER — Ambulatory Visit (INDEPENDENT_AMBULATORY_CARE_PROVIDER_SITE_OTHER)

## 2024-06-16 DIAGNOSIS — Z5181 Encounter for therapeutic drug level monitoring: Secondary | ICD-10-CM

## 2024-06-16 LAB — POCT INR: INR: 1.6 — AB (ref 2.0–3.0)

## 2024-06-16 NOTE — Progress Notes (Signed)
 INR 1.6; Please see anticoagulation encounter

## 2024-06-16 NOTE — Patient Instructions (Signed)
 Description   ONLY USE WARFARIN 2.5 mg TABS Called and spoke to pt and instructed to take 1 1/2 tablet tonight and then resume taking warfarin 1 tablet daily.  Recheck INR in 1 week.  [facility] [phone]

## 2024-06-23 ENCOUNTER — Ambulatory Visit (INDEPENDENT_AMBULATORY_CARE_PROVIDER_SITE_OTHER)

## 2024-06-23 DIAGNOSIS — Z952 Presence of prosthetic heart valve: Secondary | ICD-10-CM

## 2024-06-23 DIAGNOSIS — Z7901 Long term (current) use of anticoagulants: Secondary | ICD-10-CM

## 2024-06-23 LAB — POCT INR: INR: 1.5 — AB (ref 2.0–3.0)

## 2024-06-23 NOTE — Progress Notes (Signed)
 INR 1.5. Please see anticoagulation encounter

## 2024-06-23 NOTE — Patient Instructions (Signed)
 Description   ONLY USE WARFARIN 2.5 mg TABS Called and spoke to pt and instructed to take 1.5 tablets today and tomorrow, then resume taking warfarin 1 tablet daily.  Recheck INR in 1 week.  [facility] [phone]

## 2024-06-30 ENCOUNTER — Ambulatory Visit (INDEPENDENT_AMBULATORY_CARE_PROVIDER_SITE_OTHER): Admitting: Cardiology

## 2024-06-30 DIAGNOSIS — Z952 Presence of prosthetic heart valve: Secondary | ICD-10-CM | POA: Diagnosis not present

## 2024-06-30 DIAGNOSIS — Z5181 Encounter for therapeutic drug level monitoring: Secondary | ICD-10-CM

## 2024-06-30 LAB — POCT INR: INR: 2 (ref 2.0–3.0)

## 2024-06-30 NOTE — Progress Notes (Signed)
 INR 2.0 Please see anticoagulation encounter.

## 2024-06-30 NOTE — Patient Instructions (Signed)
 Description   ONLY USE WARFARIN 2.5 mg TABS Called and spoke to pt and instructed him to START taking warfarin 1 tablet daily except for 1.5 tablets on Thursdays. Recheck INR in 1 week.  [facility] [phone]

## 2024-07-05 ENCOUNTER — Telehealth: Payer: Self-pay

## 2024-07-05 DIAGNOSIS — I712 Thoracic aortic aneurysm, without rupture, unspecified: Secondary | ICD-10-CM

## 2024-07-05 DIAGNOSIS — Z01812 Encounter for preprocedural laboratory examination: Secondary | ICD-10-CM

## 2024-07-05 NOTE — Telephone Encounter (Signed)
 Called patient and gave instructions to have CTA-Aorta for dilated aortic root prior to office visit on 08/31/24. Also informed patient he is to have lab work done prior to scan. Patient verbalized understanding. CTA and labs ordered.  Non-Cardiac CT Angiography (CTA), is a special type of CT scan that uses a computer to produce multi-dimensional views of major blood vessels throughout the body. In CT angiography, a contrast material is injected through an IV to help visualize the blood vessels.  Josie RN

## 2024-07-07 ENCOUNTER — Ambulatory Visit (INDEPENDENT_AMBULATORY_CARE_PROVIDER_SITE_OTHER)

## 2024-07-07 DIAGNOSIS — Z5181 Encounter for therapeutic drug level monitoring: Secondary | ICD-10-CM | POA: Diagnosis not present

## 2024-07-07 LAB — POCT INR: INR: 5.9 — AB (ref 2.0–3.0)

## 2024-07-07 NOTE — Patient Instructions (Signed)
 Description   ONLY USE WARFARIN 2.5 mg TABS Called and spoke to pt and instructed him to HOLD Warfarin today, tomorrow, and Saturday and then resume taking warfarin 1 tablet daily except for 1.5 tablets on Thursdays.  Recheck INR in 1 week.  [facility] [phone]

## 2024-07-07 NOTE — Progress Notes (Signed)
 INR 5.9 Please see anticoagulation encounter

## 2024-07-11 ENCOUNTER — Ambulatory Visit (HOSPITAL_COMMUNITY)
Admission: RE | Admit: 2024-07-11 | Discharge: 2024-07-11 | Disposition: A | Discharge status: Home / Self Care | Source: Ambulatory Visit | Attending: Cardiovascular Disease | Admitting: Cardiovascular Disease

## 2024-07-11 DIAGNOSIS — I7121 Aneurysm of the ascending aorta, without rupture: Secondary | ICD-10-CM | POA: Diagnosis not present

## 2024-07-11 DIAGNOSIS — Z952 Presence of prosthetic heart valve: Secondary | ICD-10-CM | POA: Diagnosis not present

## 2024-07-11 DIAGNOSIS — I700 Atherosclerosis of aorta: Secondary | ICD-10-CM | POA: Diagnosis not present

## 2024-07-11 DIAGNOSIS — Z951 Presence of aortocoronary bypass graft: Secondary | ICD-10-CM | POA: Insufficient documentation

## 2024-07-11 DIAGNOSIS — K449 Diaphragmatic hernia without obstruction or gangrene: Secondary | ICD-10-CM | POA: Insufficient documentation

## 2024-07-11 DIAGNOSIS — I712 Thoracic aortic aneurysm, without rupture, unspecified: Secondary | ICD-10-CM | POA: Insufficient documentation

## 2024-07-11 MED ORDER — IOHEXOL 350 MG/ML SOLN
75.0000 mL | Freq: Once | INTRAVENOUS | Status: AC | PRN
  Administered 2024-07-11: 75 mL via INTRAVENOUS

## 2024-07-14 ENCOUNTER — Ambulatory Visit (INDEPENDENT_AMBULATORY_CARE_PROVIDER_SITE_OTHER)

## 2024-07-14 DIAGNOSIS — Z5181 Encounter for therapeutic drug level monitoring: Secondary | ICD-10-CM

## 2024-07-14 LAB — POCT INR: INR: 2.1 (ref 2.0–3.0)

## 2024-07-14 NOTE — Patient Instructions (Signed)
 Description   ONLY USE WARFARIN 2.5 mg TABS Called and spoke to pt and instructed him to continue taking warfarin 1 tablet daily except for 1.5 tablets on Thursdays.  Recheck INR in 2 weeks.  [facility] [phone]

## 2024-07-14 NOTE — Progress Notes (Signed)
 INR 2.1; Please see anticoagulation encounter

## 2024-07-21 ENCOUNTER — Ambulatory Visit: Payer: Self-pay | Admitting: Internal Medicine

## 2024-07-21 DIAGNOSIS — I712 Thoracic aortic aneurysm, without rupture, unspecified: Secondary | ICD-10-CM

## 2024-07-28 ENCOUNTER — Ambulatory Visit (INDEPENDENT_AMBULATORY_CARE_PROVIDER_SITE_OTHER): Admitting: *Deleted

## 2024-07-28 ENCOUNTER — Telehealth: Payer: Self-pay | Admitting: *Deleted

## 2024-07-28 DIAGNOSIS — Z952 Presence of prosthetic heart valve: Secondary | ICD-10-CM

## 2024-07-28 DIAGNOSIS — Z7901 Long term (current) use of anticoagulants: Secondary | ICD-10-CM

## 2024-07-28 LAB — POCT INR: INR: 4 — AB (ref 2.0–3.0)

## 2024-07-28 NOTE — Telephone Encounter (Signed)
 Called patient to remind him that his INR is due today. He states he forgot and will go back home to get it done.

## 2024-07-28 NOTE — Patient Instructions (Addendum)
 Description   ONLY USE WARFARIN 2.5 mg TABS INR-4.0; Spoke with patient and advised him to not to take any warfarin today and have some leafy veggies then continue taking warfarin 1 tablet daily except for 1.5 tablets on Thursdays.  Recheck INR in 1 week (normally 2 weeks-self tester).   [facility] [phone]

## 2024-07-28 NOTE — Progress Notes (Signed)
 Description   ONLY USE WARFARIN 2.5 mg TABS INR-4.0; Spoke with patient and advised him to not to take any warfarin today and have some leafy veggies then continue taking warfarin 1 tablet daily except for 1.5 tablets on Thursdays.  Recheck INR in 1 week (normally 2 weeks-self tester).   [facility] [phone]

## 2024-08-04 ENCOUNTER — Ambulatory Visit (INDEPENDENT_AMBULATORY_CARE_PROVIDER_SITE_OTHER): Admitting: Cardiology

## 2024-08-04 DIAGNOSIS — Z952 Presence of prosthetic heart valve: Secondary | ICD-10-CM

## 2024-08-04 DIAGNOSIS — Z7901 Long term (current) use of anticoagulants: Secondary | ICD-10-CM | POA: Diagnosis not present

## 2024-08-04 LAB — POCT INR: INR: 2.9 (ref 2.0–3.0)

## 2024-08-04 NOTE — Progress Notes (Signed)
 INR 2.9 Please see anticoagulation encounter ONLY USE WARFARIN 2.5 mg TABS Spoke with patient and advised him to  continue taking warfarin 1 tablet daily except for 1.5 tablets on Thursdays.  Recheck INR in 1 week (normally 2 weeks-self tester).   [facility] [phone]

## 2024-08-11 ENCOUNTER — Ambulatory Visit (INDEPENDENT_AMBULATORY_CARE_PROVIDER_SITE_OTHER): Admitting: Cardiology

## 2024-08-11 DIAGNOSIS — Z952 Presence of prosthetic heart valve: Secondary | ICD-10-CM | POA: Diagnosis not present

## 2024-08-11 DIAGNOSIS — Z7901 Long term (current) use of anticoagulants: Secondary | ICD-10-CM | POA: Diagnosis not present

## 2024-08-11 LAB — POCT INR: INR: 2.1 (ref 2.0–3.0)

## 2024-08-11 NOTE — Progress Notes (Signed)
 INR  2.1 Please see anticoagulation encounter ONLY USE WARFARIN 2.5 mg TABS Spoke with patient and advised him to  continue taking warfarin 1 tablet daily except for 1.5 tablets on Thursdays.  Recheck INR in 2 weeks(normally 2 weeks-self tester).   [facility] [phone]

## 2024-08-25 ENCOUNTER — Ambulatory Visit (INDEPENDENT_AMBULATORY_CARE_PROVIDER_SITE_OTHER): Admitting: Internal Medicine

## 2024-08-25 DIAGNOSIS — Z952 Presence of prosthetic heart valve: Secondary | ICD-10-CM

## 2024-08-25 DIAGNOSIS — Z7901 Long term (current) use of anticoagulants: Secondary | ICD-10-CM | POA: Diagnosis not present

## 2024-08-25 LAB — POCT INR: INR: 1.9 — AB (ref 2.0–3.0)

## 2024-08-25 NOTE — Progress Notes (Signed)
 INR 1.9 Please see anticoagulation encounter ONLY USE WARFARIN 2.5 mg TABS Spoke with patient and advised him to take 2 tablets then  continue taking warfarin 1 tablet daily except for 1.5 tablets on Thursdays.  Recheck INR in 2 weeks(normally 2 weeks-self tester).   [facility] [phone]

## 2024-08-31 ENCOUNTER — Ambulatory Visit: Attending: Cardiology | Admitting: Internal Medicine

## 2024-08-31 ENCOUNTER — Encounter: Payer: Self-pay | Admitting: Internal Medicine

## 2024-08-31 VITALS — BP 131/52 | HR 77 | Ht 69.0 in | Wt 201.4 lb

## 2024-08-31 DIAGNOSIS — I4821 Permanent atrial fibrillation: Secondary | ICD-10-CM

## 2024-08-31 DIAGNOSIS — Z7901 Long term (current) use of anticoagulants: Secondary | ICD-10-CM | POA: Diagnosis not present

## 2024-08-31 DIAGNOSIS — E785 Hyperlipidemia, unspecified: Secondary | ICD-10-CM | POA: Diagnosis not present

## 2024-08-31 DIAGNOSIS — Z952 Presence of prosthetic heart valve: Secondary | ICD-10-CM

## 2024-08-31 DIAGNOSIS — I712 Thoracic aortic aneurysm, without rupture, unspecified: Secondary | ICD-10-CM

## 2024-08-31 NOTE — Patient Instructions (Signed)
 Medication Instructions:  NO CHANGES  *If you need a refill on your cardiac medications before your next appointment, please call your pharmacy*  Lab Work: NMR lipoprofile and LPa today  First Floor  If you have labs (blood work) drawn today and your tests are completely normal, you will receive your results only by: MyChart Message (if you have MyChart) OR A paper copy in the mail If you have any lab test that is abnormal or we need to change your treatment, we will call you to review the results.  Testing/Procedures: CT test due 06/2025  Follow-Up: At [facility], you and your health needs are our priority.  As part of our continuing mission to provide you with exceptional heart care, our providers are all part of one team.  This team includes your primary Cardiologist (physician) and Advanced Practice Providers or APPs (Physician Assistants and Nurse Practitioners) who all work together to provide you with the care you need, when you need it.  Your next appointment:    12 months with Dr. Mona  We recommend signing up for the patient portal called MyChart.  Sign up information is provided on this After Visit Summary.  MyChart is used to connect with patients for Virtual Visits (Telemedicine).  Patients are able to view lab/test results, encounter notes, upcoming appointments, etc.  Non-urgent messages can be sent to your provider as well.   To learn more about what you can do with MyChart, go to [email or web address].   Other Instructions

## 2024-08-31 NOTE — Progress Notes (Signed)
 OFFICE NOTE  Chief Complaint:  No complaints  Primary Care Physician: Pa, [facility]  HPI:  Billy Reilly is a 83 year old gentleman, previously followed by Dr. Morgan, with a history of coronary disease. In 2004, he had CABG and replacement of his aortic valve with a mechanical prosthesis. He had 2-vessel bypass at the time with saphenous vein graft to the first diagonal and saphenous vein graft to the posterior descending by Dr. Fleeta Ochoa, and replacement of the aortic valve with a 22 mm ATS AP valve. He has done well with valve replacement to this point without any recurrent angina, shortness of breath, palpitations, presyncope, or syncopal symptoms. INR checked today in the office is 2.6 and it has been stable on Coumadin  without dose adjustments for at least a year. 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 Recently he was started on Farxiga , which is resulted in excellent weight loss as well as control of his blood sugars.  Overall he feels quite well.  I saw Billy Reilly back today in the office. He is here for DOT visit. When I last saw him he was doing well denies any chest pain or shortness of breath. That holds true today. His warfarin levels have been well controlled his INR was 3.1 today. He has not had stress testing in over 5 years and his bypass was in 2004. 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  Billy Reilly returns today for follow-up. I last saw him 6 months ago for DOT physical. At that time we performed a nuclear stress test which was negative for ischemia. 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 He denies any chest pain or worsening shortness of breath. Blood pressures been well controlled.  I saw Billy Reilly back today in the office for follow-up. He returns for a DOT letter. He continues to do well after bypass  surgery mechanical valve replacement. His INRs have been therapeutic. He is completely asymptomatic. 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 He had a stress test last year which was negative for ischemia. He is due for repeat echocardiogram to reassess his valve gradients this year.  01/20/2017  Billy Reilly was seen today in follow-up. He is due for renewal of his DOT physical. From a cardiac standpoint he seems stable. Denies a chest pain or worsening shortness of breath. An echocardiogram last year which showed a stable aortic valve gradient. 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 He is on warfarin and INR today was 2.8. EKG shows sinus rhythm.  12/23/2017  Billy Reilly was seen today for routine follow-up.  He continues to work and requires a renewal of his DOT letter.  He has an upcoming echo at the end of February to assess the stability of his valve.  He denies any chest pain worsening shortness of breath.  His INR was a slightly low he needs some adjustment.  EKG is unchanged.  01/10/2019  Billy Reilly returns today for follow-up.  Overall he continues to do well.  He still working for [employer].  He drives oil trucks.  He continues to get annual DOT physicals.  He has an upcoming physical.  His echo last year showed a stable LVEF with normally functioning mechanical aortic valve.  His  INR was therapeutic today on warfarin.  He denies any worsening shortness of breath.  He has had no further chest pain.  He is now more than 15 years out from his bypass surgery.  I did review lab work from December 2019 which showed total cholesterol 146, HDL 40, LDL 89 and triglycerides 81.  His hemoglobin A1c was 8.4.  We discussed diet as well does sound like he has somewhat of increased saturated fats in his diet which could be further optimized.  12/05/2019  Billy Reilly is seen today in follow-up.  Overall he remains asymptomatic.  Denies any  new or worsening chest pain or shortness of breath.  Recently saw his primary care provider and needs clearance for DOT physical.  Today he was noted incidentally to have atrial flutter with borderline ventricular response on his EKG.  Review of prior EKGs did not demonstrate this.  Fortunately he is anticoagulated on warfarin for his mechanical aortic valve.  He has had therapeutic INRs with this and is due for an INR today.  02/24/2020  Billy Reilly is seen today in follow-up. When I last saw him he was in newly recognized atrial flutter. He stated he was asymptomatic. I repeated his echo which showed normal LVEF 65 to 70% however there is severe left atrial enlargement and moderate right atrial enlargement. His mechanical aortic valve seems to be working appropriately. 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 He reports he continues to be asymptomatic. Today he is noted to be in atrial fibrillation.  10/24/2021  Billy Reilly returns today for follow-up.  He is without complaints.  He denies any chest pain or worsening shortness of breath.  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  He is also been followed by his primary care provider closely.  His A1c had increased recently up to 8.4% but he was taking only apparently half of the 25 mg Jardiance  tablet to save money.  Total cholesterol was 130 this summer, HDL 43, triglycerides 88 and LDL 70.  10/21/2022  Billy Reilly is seen today in follow-up.  Overall he says he is feeling well.  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  His mechanical aortic valve was noted.  His last echo to assess this was in 2019 which showed normal function of the mechanical aortic valve and normal LVEF 55 to 60%.  Mean gradient across the valve was 20 mmHg.  06/15/2023  Billy Reilly is seen today in follow-up.   Since I last saw him he has retired.  He was recently in the hospital for several days with presumed acute GI bleeding, including several days of abdominal pain and decreased appetite with black stools however no clear GI etiology was found with EGD.  He has resumed anticoagulation.  His lowest hemoglobin was 10.2 but recently was 11.5.  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  08/31/2024  Billy Reilly is seen today in follow-up.  Overall he is doing well.  Denies any chest pain or worsening shortness of breath.  He had to switch providers to Dr. Tisovec with [facility] medical.  Recently he was put on insulin  and it was  switched to Ozempic  from Jardiance  due to less cost.  He has not lost much weight but he reports his A1c has come down now just over 7%.  He had lipids done in January.  At that time his total cholesterol was 173, triglycerides 106, HDL 46 and LDL 1007.  His target LDL is less than 70 given his history of coronary disease and diabetes.  He is only on 10 mg rosuvastatin .  He remains on warfarin and does home INR checks which have been stable.  He does have a history of mechanical aortic valve which was reassessed last year by echo and showed a normal gradient.  He also has aortic aneurysm which has been stable by CT angiogram that was in August of this year, measuring 4.2 cm this had previously measured 4.0 cm in 2023 however had in the interim measured 4.4 cm.  PMHx:  Past Medical History:  Diagnosis Date   Anemia    AS (aortic stenosis)    valve replacement-st. jude mechanical valve   ASHD (arteriosclerotic heart disease)    CAD (coronary artery disease), native coronary artery    CABG 07/18/2003   Cancer (HCC)    prostate   GERD (gastroesophageal reflux disease)    History of nuclear stress test 02/12/10   EF 67%, normal perfusion   Hyperlipidemia    Hypertension     Morbid obesity (HCC)    Type II or unspecified type diabetes mellitus without mention of complication, not stated as uncontrolled    Umbilical hernia    Vitamin B12 deficiency    Vitamin D  deficiency     Past Surgical History:  Procedure Laterality Date   AORTIC VALVE REPLACEMENT  07/18/2003   22mm ATS-AP valve   AORTIC VALVE REPLACEMENT (AVR)/CORONARY ARTERY BYPASS GRAFTING (CABG)  07/18/2003   AVR; bypass x 2-SVG to first diag, SVG to posterior descending   CARDIAC CATHETERIZATION  07/13/2003   AS-aortic valve gradient , valve area 1.1; obstruction of ostium of posterior lateral branch with obstruction in the ostium of first diagnonal   ESOPHAGOGASTRODUODENOSCOPY (EGD) WITH PROPOFOL  N/A 05/22/2023   Procedure: ESOPHAGOGASTRODUODENOSCOPY (EGD) WITH PROPOFOL ;  Surgeon: Rosalie Kitchens, MD;  Location: [facility];  Service: Gastroenterology;  Laterality: N/A;   HERNIA REPAIR  09/11/2011   RIH, umbilical   NM MYOCAR PERF WALL MOTION  02/12/2010   protocol:Bruce, EF 67%, exercise cap 7 METS, low risk scan   TEE WITHOUT CARDIOVERSION  07/18/2003   no evidence of PE, Aortic valve heavily calcified,    TRANSTHORACIC ECHOCARDIOGRAM  02/21/2013   EF60-65%, Septal motion showed paradox. atrium moderately dilated    FAMHx:  Family History  Problem Relation Age of Onset   Stroke Mother    Heart attack Father    Heart disease Father    Cancer Brother    Cancer Sister     SOCHx:   reports that he has never smoked. He has never used smokeless tobacco. He reports that he does not drink alcohol and does not use drugs.  ALLERGIES:  No Known Allergies  ROS: Pertinent items noted in HPI and remainder of comprehensive ROS otherwise negative.  HOME MEDS: Current Outpatient Medications  Medication Sig Dispense Refill   clotrimazole -betamethasone  (LOTRISONE ) cream Apply to rash 2 to 3 x /day 45 g 3   glucose blood (ONE TOUCH ULTRA TEST) test strip CHECK BLOOD SUGAR ONCE DAILY 100 each  3   insulin  glargine (LANTUS) 100 UNIT/ML Solostar Pen Inject 40 Units into the skin  daily.     metFORMIN  (GLUCOPHAGE -XR) 500 MG 24 hr tablet Take  2 tablets  2  x / day with meals  for Diabetes                               /                                                                   TAKE                                         BY                                                 MOUTH 360 tablet 3   metoprolol  succinate (TOPROL -XL) 50 MG 24 hr tablet Take 1 tablet (50 mg total) by mouth daily. Take with or immediately following a meal.     pantoprazole  (PROTONIX ) 40 MG tablet Take  1 tablet   Daily  to Prevent Heartburn & Indigestion 90 tablet 3   rosuvastatin  (CRESTOR ) 10 MG tablet TAKE 1 TABLET BY MOUTH EVERY DAY 90 tablet 3   Semaglutide , 1 MG/DOSE, (OZEMPIC , 1 MG/DOSE,) 4 MG/3ML SOPN Inject 0.5 mg as directed once a week.     warfarin (COUMADIN ) 2.5 MG tablet TAKE 1/2 A TABLET TO 1 TABLET BY MOUTH DAILY OR AS INSTRUCTED BY THE COUMADIN  CLINIC 90 tablet 1   No current facility-administered medications for this visit.   No results found for: LIPOA    LABS/IMAGING: No results found for this or any previous visit (from the past 48 hours). No results found.  VITALS: BP (!) 131/52   Pulse 77   Ht 5' 9 (1.753 m)   Wt 201 lb 6.4 oz (91.4 kg)   SpO2 97%   BMI 29.74 kg/m   EXAM: General appearance: alert and no distress Lungs: clear to auscultation bilaterally Heart: regular rate and rhythm, S1, S2 normal, no murmur, click, rub or gallop Extremities: extremities normal, atraumatic, no cyanosis or edema Neurologic: Grossly normal Psych: Pleasant  EKG: EKG Interpretation Date/Time:  Wednesday August 31 2024 08:35:28 EDT Ventricular Rate:  77 PR Interval:    QRS Duration:  96 QT Interval:  384 QTC Calculation: 434 R Axis:   -19  Text Interpretation: Atrial fibrillation When compared with ECG of 21-May-2023 17:42, Criteria for Anterior infarct are no longer Present T  wave inversion no longer evident in Lateral leads Confirmed by Mona Kent [id number]) on 08/31/2024 8:47:43 AM    ASSESSMENT: Permanent atrial fibrillation Recent possible GI Bleed CAD status post 2 vessel CABG (SVG to diagonal SVG to PDA) in 2004 Status post ATS mechanical aortic valve Lifetime anticoagulation on warfarin HTN Dyslipidemia, goal LDL less than 70 DM2 Encounter for DOT physical Ascending aortic aneurysm-4.1-4.4 cm  PLAN: 1.   Billy Reilly continues to do well without any chest pain or worsening shortness of breath.  He is anticoagulated on warfarin.  His echo was stable last year showing normal valve gradients and normal LV function.  Blood pressure is well-controlled.  His diabetes and is improving with A1c a little over 7% now on insulin  and Ozempic .  He is cholesterol is above target.  He is on a lower dose of rosuvastatin .  I like to repeat lipids today since he has been on the Ozempic  and I may need to titrate his statin further.  He did have repeat of his aortic aneurysm imaging which seems fairly stable.  That will be repeated in 1 year.  Follow-up with me annually or sooner as necessary.  Vinie KYM Maxcy, MD, [facility], FNLA, FACP  [place]  [facility]  Medical Director of the [facility] Lipid [facility] &  Cardiovascular Risk Reduction Clinic Diplomate of the American Board of Clinical Lipidology Attending Cardiologist  Direct Dial: [phone]  Fax: [phone]  Website:  [email or web address] Vinie JAYSON Maxcy 08/31/2024, 8:47 AM

## 2024-09-01 ENCOUNTER — Ambulatory Visit: Payer: Self-pay | Admitting: Internal Medicine

## 2024-09-01 LAB — NMR, LIPOPROFILE
Cholesterol, Total: 131 mg/dL (ref 100–199)
HDL Particle Number: 31.3 umol/L
HDL-C: 48 mg/dL
LDL Particle Number: 777 nmol/L
LDL Size: 20.8 nm
LDL-C (NIH Calc): 67 mg/dL (ref 0–99)
LP-IR Score: 27
Small LDL Particle Number: 270 nmol/L
Triglycerides: 80 mg/dL (ref 0–149)

## 2024-09-01 LAB — LIPOPROTEIN A (LPA): Lipoprotein (a): 223.4 nmol/L — ABNORMAL HIGH

## 2024-09-08 ENCOUNTER — Ambulatory Visit (INDEPENDENT_AMBULATORY_CARE_PROVIDER_SITE_OTHER): Admitting: Student in an Organized Health Care Education/Training Program

## 2024-09-08 DIAGNOSIS — Z5181 Encounter for therapeutic drug level monitoring: Secondary | ICD-10-CM

## 2024-09-08 DIAGNOSIS — Z952 Presence of prosthetic heart valve: Secondary | ICD-10-CM

## 2024-09-08 LAB — POCT INR: INR: 2.2 (ref 2.0–3.0)

## 2024-09-08 NOTE — Progress Notes (Signed)
 Lab Results  Component Value Date   INR 2.2 09/08/2024   INR 1.9 (A) 08/25/2024   INR 2.1 08/11/2024    Description   ONLY USE WARFARIN 2.5 mg TABS Spoke with patient and advised him continue taking warfarin 1 tablet daily except for 1.5 tablets on Thursdays.  Recheck INR in 2 weeks(normally 2 weeks-self tester).   Coumadin  Clinic [phone]

## 2024-09-08 NOTE — Patient Instructions (Signed)
 Description   ONLY USE WARFARIN 2.5 mg TABS Spoke with patient and advised him continue taking warfarin 1 tablet daily except for 1.5 tablets on Thursdays.  Recheck INR in 2 weeks(normally 2 weeks-self tester).   Coumadin  Clinic [phone]

## 2024-09-15 ENCOUNTER — Other Ambulatory Visit: Payer: Self-pay | Admitting: Nurse Practitioner

## 2024-09-15 ENCOUNTER — Other Ambulatory Visit: Payer: Self-pay | Admitting: Cardiology

## 2024-09-15 ENCOUNTER — Other Ambulatory Visit: Payer: Self-pay | Admitting: Internal Medicine

## 2024-09-15 DIAGNOSIS — Z7901 Long term (current) use of anticoagulants: Secondary | ICD-10-CM

## 2024-09-15 DIAGNOSIS — Z5181 Encounter for therapeutic drug level monitoring: Secondary | ICD-10-CM

## 2024-09-15 DIAGNOSIS — Z952 Presence of prosthetic heart valve: Secondary | ICD-10-CM

## 2024-09-16 NOTE — Telephone Encounter (Signed)
 Warfarin 2.5mg  refill H/O mechanical aortic valve replacement  Last INR 09/08/24 Last OV 08/31/24

## 2024-09-22 ENCOUNTER — Ambulatory Visit (INDEPENDENT_AMBULATORY_CARE_PROVIDER_SITE_OTHER): Admitting: Student in an Organized Health Care Education/Training Program

## 2024-09-22 DIAGNOSIS — Z7901 Long term (current) use of anticoagulants: Secondary | ICD-10-CM | POA: Diagnosis not present

## 2024-09-22 DIAGNOSIS — Z952 Presence of prosthetic heart valve: Secondary | ICD-10-CM

## 2024-09-22 LAB — POCT INR: INR: 1.9 — AB (ref 2.0–3.0)

## 2024-09-22 NOTE — Progress Notes (Signed)
 INR 1.9 Please see anticoagulation encounter ONLY USE WARFARIN 2.5 mg TABS Spoke with patient and advised him to take 2 tablets today only then  continue taking warfarin 1 tablet daily except for 1.5 tablets on Thursdays.  Recheck INR in 2 weeks(normally 2 weeks-self tester).   Coumadin  Clinic [phone]

## 2024-09-30 ENCOUNTER — Other Ambulatory Visit: Payer: Self-pay | Admitting: Internal Medicine

## 2024-10-03 MED ORDER — METOPROLOL SUCCINATE ER 50 MG PO TB24: 50.0000 mg | ORAL_TABLET | Freq: Every day | ORAL | 3 refills | Status: AC

## 2024-10-06 ENCOUNTER — Ambulatory Visit (INDEPENDENT_AMBULATORY_CARE_PROVIDER_SITE_OTHER): Admitting: Internal Medicine

## 2024-10-06 DIAGNOSIS — Z952 Presence of prosthetic heart valve: Secondary | ICD-10-CM

## 2024-10-06 DIAGNOSIS — Z5181 Encounter for therapeutic drug level monitoring: Secondary | ICD-10-CM

## 2024-10-06 LAB — POCT INR: INR: 1.1 — AB (ref 2.0–3.0)

## 2024-10-06 NOTE — Progress Notes (Signed)
 Lab Results  Component Value Date   INR 1.1 (A) 10/06/2024   INR 1.9 (A) 09/22/2024   INR 2.2 09/08/2024    Description   ONLY USE WARFARIN 2.5 mg TABS Spoke with patient and advised him to take Warfarin 2.5 tablets today only  Then START taking warfarin 1 tablet daily except for 1.5 tablets on Sunday and Thursdays.  Recheck INR in 1 week(normally 2 weeks-self tester).   Coumadin  Clinic [phone]

## 2024-10-06 NOTE — Patient Instructions (Signed)
 Description   ONLY USE WARFARIN 2.5 mg TABS Spoke with patient and advised him to take Warfarin 2.5 tablets today only  Then START taking warfarin 1 tablet daily except for 1.5 tablets on Sunday and Thursdays.  Recheck INR in 1 week(normally 2 weeks-self tester).   Coumadin  Clinic [phone]

## 2024-10-13 ENCOUNTER — Ambulatory Visit (INDEPENDENT_AMBULATORY_CARE_PROVIDER_SITE_OTHER): Admitting: Cardiovascular Disease

## 2024-10-13 DIAGNOSIS — Z952 Presence of prosthetic heart valve: Secondary | ICD-10-CM

## 2024-10-13 DIAGNOSIS — Z7901 Long term (current) use of anticoagulants: Secondary | ICD-10-CM | POA: Diagnosis not present

## 2024-10-13 LAB — POCT INR: INR: 1.6 — AB (ref 2.0–3.0)

## 2024-10-13 NOTE — Progress Notes (Signed)
 INR 1.6 Please see anticoagulation encounter ONLY USE WARFARIN 2.5 mg TABS Spoke with patient and advised him to take Warfarin 2.5 tablets today only  Then continue taking warfarin 1 tablet daily except for 1.5 tablets on Sunday and Thursdays.  Recheck INR in 2 week(normally 2 weeks-self tester).   Coumadin  Clinic [phone]

## 2024-10-27 ENCOUNTER — Ambulatory Visit (INDEPENDENT_AMBULATORY_CARE_PROVIDER_SITE_OTHER): Admitting: Cardiology

## 2024-10-27 DIAGNOSIS — Z7901 Long term (current) use of anticoagulants: Secondary | ICD-10-CM

## 2024-10-27 DIAGNOSIS — Z952 Presence of prosthetic heart valve: Secondary | ICD-10-CM | POA: Diagnosis not present

## 2024-10-27 LAB — POCT INR: INR: 2.3 (ref 2.0–3.0)

## 2024-10-27 NOTE — Progress Notes (Signed)
 INR 2.3 Please see anticoagulation encounter ONLY USE WARFARIN 2.5 mg TABS Spoke with patient and advised to continue taking warfarin 1 tablet daily except for 1.5 tablets on Sunday and Thursdays.  Recheck INR in 2 week(normally 2 weeks-self tester).   Coumadin  Clinic [phone]

## 2024-11-10 ENCOUNTER — Ambulatory Visit (INDEPENDENT_AMBULATORY_CARE_PROVIDER_SITE_OTHER): Admitting: Internal Medicine

## 2024-11-10 DIAGNOSIS — Z952 Presence of prosthetic heart valve: Secondary | ICD-10-CM | POA: Diagnosis not present

## 2024-11-10 DIAGNOSIS — Z7901 Long term (current) use of anticoagulants: Secondary | ICD-10-CM | POA: Diagnosis not present

## 2024-11-10 LAB — POCT INR: INR: 2.1 (ref 2.0–3.0)

## 2024-11-10 NOTE — Progress Notes (Signed)
 INR 2.1 Please see anticoagulation encounter ONLY USE WARFARIN 2.5 mg TABS Spoke with patient and advised to continue taking warfarin 1 tablet daily except for 1.5 tablets on Sunday and Thursdays.  Recheck INR in 2 week(normally 2 weeks-self tester).   Coumadin  Clinic [phone]

## 2024-11-25 ENCOUNTER — Telehealth: Payer: Self-pay | Admitting: *Deleted

## 2024-11-25 ENCOUNTER — Ambulatory Visit (INDEPENDENT_AMBULATORY_CARE_PROVIDER_SITE_OTHER): Payer: Self-pay | Admitting: *Deleted

## 2024-11-25 DIAGNOSIS — Z7901 Long term (current) use of anticoagulants: Secondary | ICD-10-CM | POA: Diagnosis not present

## 2024-11-25 DIAGNOSIS — Z952 Presence of prosthetic heart valve: Secondary | ICD-10-CM

## 2024-11-25 LAB — POCT INR: INR: 6 — AB (ref 2.0–3.0)

## 2024-11-25 NOTE — Progress Notes (Signed)
 INR goal 2-3 Pt has taken warfarin doses twice in a day, educated him on this and on bleeding/ bruising risk.   Description   ONLY USE WARFARIN 2.5 mg TABS DO NOT TRY TO CATCH UP MISSED DOSES OF  WARFARIN.  INR-6.0; Spoke with patient and advised to not to take any warfarin today and no warfarin tomorrow and have a salad then continue taking warfarin 1 tablet daily except for 1.5 tablets on Sunday and Thursdays.  Recheck INR in 1 week (normally 2 weeks-self tester).   Coumadin  Clinic [phone]

## 2024-11-25 NOTE — Patient Instructions (Signed)
 Description   ONLY USE WARFARIN 2.5 mg TABS DO NOT TRY TO CATCH UP MISSED DOSES OF  WARFARIN.  INR-6.0; Spoke with patient and advised to not to take any warfarin today and no warfarin tomorrow and have a salad then continue taking warfarin 1 tablet daily except for 1.5 tablets on Sunday and Thursdays.  Recheck INR in 1 week (normally 2 weeks-self tester).   [facility] [phone]

## 2024-11-25 NOTE — Telephone Encounter (Signed)
 Called patient since INR is due and he states he was told to do it last week and never called it in! Advised that it is due today since last check was on 11/10/24. After much discussion that it's due today he states he will do it today after lunch.

## 2024-11-29 ENCOUNTER — Ambulatory Visit: Payer: Medicare Other | Admitting: Nurse Practitioner

## 2024-12-01 ENCOUNTER — Ambulatory Visit (INDEPENDENT_AMBULATORY_CARE_PROVIDER_SITE_OTHER): Admitting: Student in an Organized Health Care Education/Training Program

## 2024-12-01 DIAGNOSIS — Z7901 Long term (current) use of anticoagulants: Secondary | ICD-10-CM | POA: Diagnosis not present

## 2024-12-01 DIAGNOSIS — Z952 Presence of prosthetic heart valve: Secondary | ICD-10-CM | POA: Diagnosis not present

## 2024-12-01 LAB — POCT INR: INR: 1.3 — AB (ref 2.0–3.0)

## 2024-12-01 NOTE — Progress Notes (Signed)
"   INR 1.3 Please see anticoagulation encounter ONLY USE WARFARIN 2.5 mg TABS DO NOT TRY TO CATCH UP MISSED DOSES OF  WARFARIN.   Spoke with patient and advised to take 2.5 tablets today only then continue taking warfarin 1 tablet daily except for 1.5 tablets on Sunday and Thursdays.  Recheck INR in 1 week (normally 2 weeks-self tester).   [facility] [phone] "

## 2024-12-08 ENCOUNTER — Ambulatory Visit (INDEPENDENT_AMBULATORY_CARE_PROVIDER_SITE_OTHER): Admitting: Cardiology

## 2024-12-08 DIAGNOSIS — Z952 Presence of prosthetic heart valve: Secondary | ICD-10-CM | POA: Diagnosis not present

## 2024-12-08 DIAGNOSIS — Z5181 Encounter for therapeutic drug level monitoring: Secondary | ICD-10-CM | POA: Diagnosis not present

## 2024-12-08 LAB — POCT INR: INR: 2.2 (ref 2.0–3.0)

## 2024-12-08 NOTE — Progress Notes (Signed)
 Lab Results  Component Value Date   INR 2.2 12/08/2024   INR 1.3 (A) 12/01/2024   INR 6.0 (A) 11/25/2024    Description   ONLY USE WARFARIN 2.5 mg TABS INR 2.2; Spoke with patient and advised to continue taking warfarin 1 tablet daily except for 1.5 tablets on Sunday and Thursdays.  Recheck INR in  2 weeks.  [facility] [phone]

## 2024-12-08 NOTE — Patient Instructions (Signed)
 Description   ONLY USE WARFARIN 2.5 mg TABS INR 2.2; Spoke with patient and advised to continue taking warfarin 1 tablet daily except for 1.5 tablets on Sunday and Thursdays.  Recheck INR in  2 weeks.  [facility] [phone]

## 2024-12-22 ENCOUNTER — Ambulatory Visit (INDEPENDENT_AMBULATORY_CARE_PROVIDER_SITE_OTHER): Payer: Self-pay | Admitting: Cardiovascular Disease

## 2024-12-22 DIAGNOSIS — Z952 Presence of prosthetic heart valve: Secondary | ICD-10-CM | POA: Diagnosis not present

## 2024-12-22 DIAGNOSIS — Z5181 Encounter for therapeutic drug level monitoring: Secondary | ICD-10-CM

## 2024-12-22 LAB — POCT INR: INR: 3.8 — AB (ref 2.0–3.0)

## 2024-12-22 NOTE — Progress Notes (Signed)
 Lab Results  Component Value Date   INR 3.8 (A) 12/22/2024   INR 2.2 12/08/2024   INR 1.3 (A) 12/01/2024    Description   ONLY USE WARFARIN 2.5 mg TABS INR 3.8, Spoke with patient and advised him to: -HOLD WARFARIN TODAY -Then continue taking warfarin 1 tablet daily except for 1.5 tablets on Sunday and Thursdays.  Recheck INR in  2 weeks.  [facility] [phone]

## 2024-12-22 NOTE — Patient Instructions (Signed)
 Description   ONLY USE WARFARIN 2.5 mg TABS INR 3.8, Spoke with patient and advised him to: -HOLD WARFARIN TODAY -Then continue taking warfarin 1 tablet daily except for 1.5 tablets on Sunday and Thursdays.  Recheck INR in  2 weeks.  [facility] [phone]

## 2024-12-26 ENCOUNTER — Encounter: Payer: Self-pay | Admitting: Pharmacist

## 2024-12-26 DIAGNOSIS — Z952 Presence of prosthetic heart valve: Secondary | ICD-10-CM

## 2024-12-26 DIAGNOSIS — Z7901 Long term (current) use of anticoagulants: Secondary | ICD-10-CM

## 2024-12-26 NOTE — Progress Notes (Signed)
 This encounter was created in error - please disregard.
